# Patient Record
Sex: Female | Born: 1955
Health system: Southern US, Community
[De-identification: ages and names within clinical notes are randomized; demographics above are authoritative.]

## PROBLEM LIST (undated history)

## (undated) DIAGNOSIS — M1990 Unspecified osteoarthritis, unspecified site: Secondary | ICD-10-CM

## (undated) DIAGNOSIS — K219 Gastro-esophageal reflux disease without esophagitis: Secondary | ICD-10-CM

## (undated) DIAGNOSIS — J069 Acute upper respiratory infection, unspecified: Secondary | ICD-10-CM

## (undated) DIAGNOSIS — I10 Essential (primary) hypertension: Secondary | ICD-10-CM

## (undated) DIAGNOSIS — A09 Infectious gastroenteritis and colitis, unspecified: Secondary | ICD-10-CM

## (undated) DIAGNOSIS — K439 Ventral hernia without obstruction or gangrene: Secondary | ICD-10-CM

## (undated) DIAGNOSIS — K635 Polyp of colon: Secondary | ICD-10-CM

## (undated) DIAGNOSIS — T7840XA Allergy, unspecified, initial encounter: Secondary | ICD-10-CM

## (undated) DIAGNOSIS — Z8719 Personal history of other diseases of the digestive system: Secondary | ICD-10-CM

## (undated) DIAGNOSIS — K5790 Diverticulosis of intestine, part unspecified, without perforation or abscess without bleeding: Secondary | ICD-10-CM

## (undated) DIAGNOSIS — E785 Hyperlipidemia, unspecified: Secondary | ICD-10-CM

## (undated) HISTORY — DX: Polyp of colon: K63.5

## (undated) HISTORY — DX: Ventral hernia without obstruction or gangrene: K43.9

## (undated) HISTORY — DX: Infectious gastroenteritis and colitis, unspecified: A09

## (undated) HISTORY — DX: Allergy, unspecified, initial encounter: T78.40XA

## (undated) HISTORY — PX: OTHER SURGICAL HISTORY: SHX169

## (undated) HISTORY — PX: HEMORRHOID SURGERY: SHX153

## (undated) HISTORY — DX: Hyperlipidemia, unspecified: E78.5

## (undated) HISTORY — PX: FOOT SURGERY: SHX648

## (undated) HISTORY — DX: Acute upper respiratory infection, unspecified: J06.9

## (undated) HISTORY — DX: Diverticulosis of intestine, part unspecified, without perforation or abscess without bleeding: K57.90

## (undated) HISTORY — PX: INGUINAL HERNIA REPAIR: SHX194

## (undated) HISTORY — PX: DIAGNOSTIC LAPAROSCOPY: SUR761

## (undated) HISTORY — PX: VENTRAL HERNIA REPAIR: SHX424

## (undated) HISTORY — DX: Essential (primary) hypertension: I10

## (undated) HISTORY — PX: MRI: SHX5353

---

## 1997-05-01 ENCOUNTER — Encounter: Payer: Self-pay | Admitting: Internal Medicine

## 1997-10-09 ENCOUNTER — Encounter (HOSPITAL_BASED_OUTPATIENT_CLINIC_OR_DEPARTMENT_OTHER): Payer: Self-pay | Admitting: General Surgery

## 1997-10-12 ENCOUNTER — Ambulatory Visit (HOSPITAL_COMMUNITY): Admission: RE | Admit: 1997-10-12 | Discharge: 1997-10-12 | Payer: Self-pay | Admitting: General Surgery

## 1999-10-04 ENCOUNTER — Other Ambulatory Visit: Admission: RE | Admit: 1999-10-04 | Discharge: 1999-10-04 | Payer: Self-pay | Admitting: Obstetrics and Gynecology

## 1999-11-23 ENCOUNTER — Ambulatory Visit (HOSPITAL_COMMUNITY): Admission: RE | Admit: 1999-11-23 | Discharge: 1999-11-23 | Payer: Self-pay | Admitting: Family Medicine

## 1999-11-28 ENCOUNTER — Ambulatory Visit (HOSPITAL_BASED_OUTPATIENT_CLINIC_OR_DEPARTMENT_OTHER): Admission: RE | Admit: 1999-11-28 | Discharge: 1999-11-28 | Payer: Self-pay | Admitting: Family Medicine

## 2001-02-14 ENCOUNTER — Emergency Department (HOSPITAL_COMMUNITY): Admission: EM | Admit: 2001-02-14 | Discharge: 2001-02-14 | Payer: Self-pay | Admitting: *Deleted

## 2001-04-24 ENCOUNTER — Other Ambulatory Visit: Admission: RE | Admit: 2001-04-24 | Discharge: 2001-04-24 | Payer: Self-pay | Admitting: Obstetrics and Gynecology

## 2001-09-13 ENCOUNTER — Encounter (HOSPITAL_BASED_OUTPATIENT_CLINIC_OR_DEPARTMENT_OTHER): Payer: Self-pay | Admitting: General Surgery

## 2001-09-18 ENCOUNTER — Encounter (INDEPENDENT_AMBULATORY_CARE_PROVIDER_SITE_OTHER): Payer: Self-pay | Admitting: *Deleted

## 2001-09-18 ENCOUNTER — Ambulatory Visit (HOSPITAL_COMMUNITY): Admission: RE | Admit: 2001-09-18 | Discharge: 2001-09-18 | Payer: Self-pay | Admitting: General Surgery

## 2002-05-27 ENCOUNTER — Other Ambulatory Visit: Admission: RE | Admit: 2002-05-27 | Discharge: 2002-05-27 | Payer: Self-pay | Admitting: Obstetrics and Gynecology

## 2002-07-22 ENCOUNTER — Encounter: Payer: Self-pay | Admitting: Family Medicine

## 2002-07-22 ENCOUNTER — Ambulatory Visit (HOSPITAL_COMMUNITY): Admission: RE | Admit: 2002-07-22 | Discharge: 2002-07-22 | Payer: Self-pay | Admitting: Family Medicine

## 2002-07-22 ENCOUNTER — Encounter (INDEPENDENT_AMBULATORY_CARE_PROVIDER_SITE_OTHER): Payer: Self-pay | Admitting: *Deleted

## 2003-07-09 ENCOUNTER — Other Ambulatory Visit: Admission: RE | Admit: 2003-07-09 | Discharge: 2003-07-09 | Payer: Self-pay | Admitting: Obstetrics and Gynecology

## 2004-07-20 ENCOUNTER — Other Ambulatory Visit: Admission: RE | Admit: 2004-07-20 | Discharge: 2004-07-20 | Payer: Self-pay | Admitting: Obstetrics and Gynecology

## 2004-08-26 ENCOUNTER — Ambulatory Visit: Payer: Self-pay | Admitting: Family Medicine

## 2004-09-27 ENCOUNTER — Ambulatory Visit: Payer: Self-pay | Admitting: Family Medicine

## 2004-10-11 ENCOUNTER — Ambulatory Visit: Payer: Self-pay | Admitting: Family Medicine

## 2004-10-24 ENCOUNTER — Encounter: Payer: Self-pay | Admitting: Family Medicine

## 2005-02-24 ENCOUNTER — Ambulatory Visit: Payer: Self-pay | Admitting: Family Medicine

## 2005-03-10 ENCOUNTER — Ambulatory Visit: Payer: Self-pay | Admitting: Family Medicine

## 2005-03-24 ENCOUNTER — Ambulatory Visit: Payer: Self-pay | Admitting: Family Medicine

## 2005-03-30 ENCOUNTER — Ambulatory Visit: Payer: Self-pay | Admitting: Family Medicine

## 2005-04-18 ENCOUNTER — Encounter: Admission: RE | Admit: 2005-04-18 | Discharge: 2005-04-18 | Payer: Self-pay | Admitting: Family Medicine

## 2005-10-10 ENCOUNTER — Ambulatory Visit: Payer: Self-pay | Admitting: Family Medicine

## 2005-11-20 ENCOUNTER — Ambulatory Visit: Payer: Self-pay | Admitting: Family Medicine

## 2006-01-24 ENCOUNTER — Ambulatory Visit: Payer: Self-pay | Admitting: Family Medicine

## 2006-04-12 DIAGNOSIS — E785 Hyperlipidemia, unspecified: Secondary | ICD-10-CM | POA: Insufficient documentation

## 2006-04-12 DIAGNOSIS — I10 Essential (primary) hypertension: Secondary | ICD-10-CM | POA: Insufficient documentation

## 2006-08-28 ENCOUNTER — Telehealth (INDEPENDENT_AMBULATORY_CARE_PROVIDER_SITE_OTHER): Payer: Self-pay | Admitting: *Deleted

## 2006-08-29 ENCOUNTER — Ambulatory Visit: Payer: Self-pay | Admitting: Family Medicine

## 2006-08-29 LAB — CONVERTED CEMR LAB: Hemoglobin: 12.5 g/dL

## 2006-09-03 ENCOUNTER — Ambulatory Visit: Payer: Self-pay | Admitting: Family Medicine

## 2006-09-10 LAB — CONVERTED CEMR LAB
ALT: 25 units/L (ref 0–35)
AST: 21 units/L (ref 0–37)
Albumin: 3.5 g/dL (ref 3.5–5.2)
Alkaline Phosphatase: 55 units/L (ref 39–117)
BUN: 10 mg/dL (ref 6–23)
Bilirubin, Direct: 0.1 mg/dL (ref 0.0–0.3)
CO2: 28 meq/L (ref 19–32)
Calcium: 9 mg/dL (ref 8.4–10.5)
Chloride: 108 meq/L (ref 96–112)
Cholesterol: 167 mg/dL (ref 0–200)
Creatinine, Ser: 0.9 mg/dL (ref 0.4–1.2)
GFR calc Af Amer: 84 mL/min
GFR calc non Af Amer: 70 mL/min
Glucose, Bld: 122 mg/dL — ABNORMAL HIGH (ref 70–99)
HDL: 39.4 mg/dL (ref >39.0)
LDL Cholesterol: 99 mg/dL (ref 0–99)
Potassium: 3.7 meq/L (ref 3.5–5.1)
Sodium: 142 meq/L (ref 135–145)
Total Bilirubin: 1 mg/dL (ref 0.3–1.2)
Total CHOL/HDL Ratio: 4.2
Total Protein: 7.3 g/dL (ref 6.0–8.3)
Triglycerides: 144 mg/dL (ref 0–149)
VLDL: 29 mg/dL (ref 0–40)

## 2006-10-31 ENCOUNTER — Ambulatory Visit: Payer: Self-pay | Admitting: Family Medicine

## 2006-11-02 ENCOUNTER — Telehealth (INDEPENDENT_AMBULATORY_CARE_PROVIDER_SITE_OTHER): Payer: Self-pay | Admitting: *Deleted

## 2006-12-12 ENCOUNTER — Ambulatory Visit: Payer: Self-pay | Admitting: Family Medicine

## 2006-12-17 LAB — CONVERTED CEMR LAB
BUN: 9 mg/dL (ref 6–23)
CO2: 29 meq/L (ref 19–32)
Calcium: 9.3 mg/dL (ref 8.4–10.5)
Chloride: 103 meq/L (ref 96–112)
Creatinine, Ser: 0.8 mg/dL (ref 0.4–1.2)
GFR calc Af Amer: 96 mL/min
GFR calc non Af Amer: 80 mL/min
Glucose, Bld: 126 mg/dL — ABNORMAL HIGH (ref 70–99)
Hgb A1c MFr Bld: 7 % — ABNORMAL HIGH (ref 4.6–6.0)
Potassium: 4 meq/L (ref 3.5–5.1)
Sodium: 139 meq/L (ref 135–145)

## 2006-12-18 ENCOUNTER — Ambulatory Visit: Payer: Self-pay | Admitting: Family Medicine

## 2006-12-21 ENCOUNTER — Ambulatory Visit: Payer: Self-pay | Admitting: Family Medicine

## 2006-12-21 DIAGNOSIS — E119 Type 2 diabetes mellitus without complications: Secondary | ICD-10-CM | POA: Insufficient documentation

## 2006-12-27 ENCOUNTER — Telehealth (INDEPENDENT_AMBULATORY_CARE_PROVIDER_SITE_OTHER): Payer: Self-pay | Admitting: *Deleted

## 2007-01-14 ENCOUNTER — Ambulatory Visit: Payer: Self-pay | Admitting: Internal Medicine

## 2007-04-09 ENCOUNTER — Telehealth (INDEPENDENT_AMBULATORY_CARE_PROVIDER_SITE_OTHER): Payer: Self-pay | Admitting: *Deleted

## 2007-04-10 ENCOUNTER — Ambulatory Visit: Payer: Self-pay | Admitting: Family Medicine

## 2007-04-10 DIAGNOSIS — J309 Allergic rhinitis, unspecified: Secondary | ICD-10-CM | POA: Insufficient documentation

## 2007-04-17 ENCOUNTER — Telehealth (INDEPENDENT_AMBULATORY_CARE_PROVIDER_SITE_OTHER): Payer: Self-pay | Admitting: *Deleted

## 2007-08-06 ENCOUNTER — Ambulatory Visit: Payer: Self-pay | Admitting: Family Medicine

## 2007-08-08 ENCOUNTER — Ambulatory Visit: Payer: Self-pay | Admitting: Family Medicine

## 2007-08-18 ENCOUNTER — Encounter (INDEPENDENT_AMBULATORY_CARE_PROVIDER_SITE_OTHER): Payer: Self-pay | Admitting: *Deleted

## 2007-08-18 ENCOUNTER — Telehealth (INDEPENDENT_AMBULATORY_CARE_PROVIDER_SITE_OTHER): Payer: Self-pay | Admitting: *Deleted

## 2007-08-18 LAB — CONVERTED CEMR LAB
Albumin: 3.5 g/dL (ref 3.5–5.2)
BUN: 12 mg/dL (ref 6–23)
Calcium: 9 mg/dL (ref 8.4–10.5)
Cholesterol: 176 mg/dL (ref 0–200)
GFR calc Af Amer: 112 mL/min
Glucose, Bld: 114 mg/dL — ABNORMAL HIGH (ref 70–99)
HDL: 37.5 mg/dL — ABNORMAL LOW (ref >39.0)
Total Protein: 7.4 g/dL (ref 6.0–8.3)
VLDL: 29 mg/dL (ref 0–40)

## 2007-12-26 ENCOUNTER — Ambulatory Visit: Payer: Self-pay | Admitting: Family Medicine

## 2007-12-30 ENCOUNTER — Ambulatory Visit: Payer: Self-pay | Admitting: Family Medicine

## 2008-01-07 LAB — CONVERTED CEMR LAB
ALT: 26 units/L (ref 0–35)
AST: 23 units/L (ref 0–37)
Alkaline Phosphatase: 55 units/L (ref 39–117)
BUN: 13 mg/dL (ref 6–23)
CO2: 26 meq/L (ref 19–32)
Chloride: 109 meq/L (ref 96–112)
Cholesterol: 169 mg/dL (ref 0–200)
Creatinine, Ser: 0.8 mg/dL (ref 0.4–1.2)
Hgb A1c MFr Bld: 6.8 % — ABNORMAL HIGH (ref 4.6–6.0)
Total Bilirubin: 1 mg/dL (ref 0.3–1.2)
Total CHOL/HDL Ratio: 3.9
Triglycerides: 156 mg/dL — ABNORMAL HIGH (ref 0–149)

## 2008-01-09 ENCOUNTER — Telehealth (INDEPENDENT_AMBULATORY_CARE_PROVIDER_SITE_OTHER): Payer: Self-pay | Admitting: *Deleted

## 2008-01-13 ENCOUNTER — Encounter (INDEPENDENT_AMBULATORY_CARE_PROVIDER_SITE_OTHER): Payer: Self-pay | Admitting: *Deleted

## 2008-05-29 ENCOUNTER — Ambulatory Visit: Payer: Self-pay | Admitting: Family Medicine

## 2008-08-07 ENCOUNTER — Ambulatory Visit: Payer: Self-pay | Admitting: Family Medicine

## 2008-08-07 DIAGNOSIS — R5382 Chronic fatigue, unspecified: Secondary | ICD-10-CM | POA: Insufficient documentation

## 2008-08-16 LAB — CONVERTED CEMR LAB
AST: 19 units/L (ref 0–37)
Albumin: 3.6 g/dL (ref 3.5–5.2)
Alkaline Phosphatase: 66 units/L (ref 39–117)
Basophils Relative: 0.3 % (ref 0.0–3.0)
Bilirubin, Direct: 0 mg/dL (ref 0.0–0.3)
CO2: 30 meq/L (ref 19–32)
Calcium: 9.3 mg/dL (ref 8.4–10.5)
Eosinophils Relative: 1.3 % (ref 0.0–5.0)
Folate: >20 ng/mL
GFR calc non Af Amer: 95.64 mL/min (ref >60)
HDL: 42.6 mg/dL (ref >39.00)
Hemoglobin: 11.7 g/dL — ABNORMAL LOW (ref 12.0–15.0)
LDL Cholesterol: 88 mg/dL (ref 0–99)
Lymphocytes Relative: 36.3 % (ref 12.0–46.0)
MCHC: 31.7 g/dL (ref 30.0–36.0)
Monocytes Relative: 7.4 % (ref 3.0–12.0)
Neutro Abs: 4.9 10*3/uL (ref 1.4–7.7)
Neutrophils Relative %: 54.7 % (ref 43.0–77.0)
RBC: 5.29 M/uL — ABNORMAL HIGH (ref 3.87–5.11)
Sodium: 141 meq/L (ref 135–145)
Total CHOL/HDL Ratio: 4
Total Protein: 7.8 g/dL (ref 6.0–8.3)
Vit D, 25-Hydroxy: 38 ng/mL (ref 30–89)
Vitamin B-12: 165 pg/mL — ABNORMAL LOW (ref 211–911)
WBC: 8.9 10*3/uL (ref 4.5–10.5)

## 2008-08-18 ENCOUNTER — Encounter (INDEPENDENT_AMBULATORY_CARE_PROVIDER_SITE_OTHER): Payer: Self-pay | Admitting: *Deleted

## 2008-08-19 ENCOUNTER — Telehealth (INDEPENDENT_AMBULATORY_CARE_PROVIDER_SITE_OTHER): Payer: Self-pay | Admitting: *Deleted

## 2008-09-18 ENCOUNTER — Ambulatory Visit: Payer: Self-pay | Admitting: Family Medicine

## 2008-10-23 ENCOUNTER — Ambulatory Visit: Payer: Self-pay | Admitting: Internal Medicine

## 2008-10-24 ENCOUNTER — Inpatient Hospital Stay (HOSPITAL_COMMUNITY): Admission: EM | Admit: 2008-10-24 | Discharge: 2008-10-27 | Payer: Self-pay | Admitting: Emergency Medicine

## 2008-10-24 ENCOUNTER — Encounter (INDEPENDENT_AMBULATORY_CARE_PROVIDER_SITE_OTHER): Payer: Self-pay | Admitting: *Deleted

## 2008-10-27 ENCOUNTER — Telehealth: Payer: Self-pay | Admitting: Family Medicine

## 2008-10-27 ENCOUNTER — Telehealth: Payer: Self-pay | Admitting: Nurse Practitioner

## 2008-10-28 ENCOUNTER — Telehealth: Payer: Self-pay | Admitting: Family Medicine

## 2008-10-28 ENCOUNTER — Telehealth: Payer: Self-pay | Admitting: Nurse Practitioner

## 2008-10-30 ENCOUNTER — Ambulatory Visit: Payer: Self-pay | Admitting: Family Medicine

## 2008-11-02 ENCOUNTER — Encounter: Payer: Self-pay | Admitting: Family Medicine

## 2008-11-13 ENCOUNTER — Ambulatory Visit: Payer: Self-pay | Admitting: Gastroenterology

## 2008-11-13 DIAGNOSIS — Z8719 Personal history of other diseases of the digestive system: Secondary | ICD-10-CM | POA: Insufficient documentation

## 2008-12-04 ENCOUNTER — Encounter: Payer: Self-pay | Admitting: Nurse Practitioner

## 2009-02-11 ENCOUNTER — Telehealth (INDEPENDENT_AMBULATORY_CARE_PROVIDER_SITE_OTHER): Payer: Self-pay | Admitting: *Deleted

## 2009-02-12 ENCOUNTER — Ambulatory Visit: Payer: Self-pay | Admitting: Family Medicine

## 2009-03-01 ENCOUNTER — Telehealth (INDEPENDENT_AMBULATORY_CARE_PROVIDER_SITE_OTHER): Payer: Self-pay | Admitting: *Deleted

## 2009-03-08 ENCOUNTER — Encounter: Payer: Self-pay | Admitting: Family Medicine

## 2009-03-16 ENCOUNTER — Ambulatory Visit: Payer: Self-pay | Admitting: Family Medicine

## 2009-03-25 ENCOUNTER — Telehealth (INDEPENDENT_AMBULATORY_CARE_PROVIDER_SITE_OTHER): Payer: Self-pay | Admitting: *Deleted

## 2009-03-25 LAB — CONVERTED CEMR LAB
ALT: 23 units/L (ref 0–35)
AST: 20 units/L (ref 0–37)
Albumin: 3.7 g/dL (ref 3.5–5.2)
Alkaline Phosphatase: 63 units/L (ref 39–117)
HDL: 49.3 mg/dL (ref >39.00)
Total Bilirubin: 0.9 mg/dL (ref 0.3–1.2)
Total CHOL/HDL Ratio: 4
Triglycerides: 164 mg/dL — ABNORMAL HIGH (ref 0.0–149.0)

## 2009-04-06 ENCOUNTER — Ambulatory Visit: Payer: Self-pay | Admitting: Family Medicine

## 2009-04-07 ENCOUNTER — Telehealth: Payer: Self-pay | Admitting: Family Medicine

## 2009-04-07 LAB — CONVERTED CEMR LAB: Anti Nuclear Antibody(ANA): NEGATIVE

## 2009-04-13 ENCOUNTER — Telehealth: Payer: Self-pay | Admitting: Family Medicine

## 2009-04-19 LAB — CONVERTED CEMR LAB
Basophils Absolute: 0.1 10*3/uL (ref 0.0–0.1)
CO2: 31 meq/L (ref 19–32)
Calcium: 9.5 mg/dL (ref 8.4–10.5)
Creatinine, Ser: 0.8 mg/dL (ref 0.4–1.2)
Eosinophils Absolute: 0.1 10*3/uL (ref 0.0–0.7)
GFR calc non Af Amer: 95.41 mL/min (ref >60)
Glucose, Bld: 111 mg/dL — ABNORMAL HIGH (ref 70–99)
Hemoglobin: 11.3 g/dL — ABNORMAL LOW (ref 12.0–15.0)
Lymphocytes Relative: 36.2 % (ref 12.0–46.0)
MCHC: 31.1 g/dL (ref 30.0–36.0)
Monocytes Relative: 7.2 % (ref 3.0–12.0)
Neutro Abs: 4.2 10*3/uL (ref 1.4–7.7)
Neutrophils Relative %: 54.1 % (ref 43.0–77.0)
RDW: 14 % (ref 11.5–14.6)
Rhuematoid fact SerPl-aCnc: 26.4 intl units/mL — ABNORMAL HIGH (ref 0.0–20.0)
Sed Rate: 28 mm/hr — ABNORMAL HIGH (ref 0–22)
Sodium: 143 meq/L (ref 135–145)
Uric Acid, Serum: 5.9 mg/dL (ref 2.4–7.0)

## 2009-09-06 ENCOUNTER — Ambulatory Visit: Payer: Self-pay | Admitting: Family Medicine

## 2009-09-06 ENCOUNTER — Telehealth: Payer: Self-pay | Admitting: Family Medicine

## 2009-09-06 DIAGNOSIS — R002 Palpitations: Secondary | ICD-10-CM | POA: Insufficient documentation

## 2009-09-10 ENCOUNTER — Telehealth (INDEPENDENT_AMBULATORY_CARE_PROVIDER_SITE_OTHER): Payer: Self-pay | Admitting: *Deleted

## 2009-09-10 ENCOUNTER — Encounter: Payer: Self-pay | Admitting: Family Medicine

## 2009-09-22 ENCOUNTER — Encounter (INDEPENDENT_AMBULATORY_CARE_PROVIDER_SITE_OTHER): Payer: Self-pay | Admitting: *Deleted

## 2009-10-05 ENCOUNTER — Encounter (INDEPENDENT_AMBULATORY_CARE_PROVIDER_SITE_OTHER): Payer: Self-pay | Admitting: *Deleted

## 2009-10-07 ENCOUNTER — Encounter: Payer: Self-pay | Admitting: Internal Medicine

## 2009-10-07 ENCOUNTER — Telehealth: Payer: Self-pay | Admitting: Internal Medicine

## 2009-10-08 ENCOUNTER — Ambulatory Visit: Payer: Self-pay | Admitting: Family Medicine

## 2009-10-11 ENCOUNTER — Telehealth (INDEPENDENT_AMBULATORY_CARE_PROVIDER_SITE_OTHER): Payer: Self-pay | Admitting: *Deleted

## 2009-10-11 LAB — CONVERTED CEMR LAB
Eosinophils Relative: 1.4 % (ref 0.0–5.0)
HCT: 35 % — ABNORMAL LOW (ref 36.0–46.0)
Hemoglobin: 11.4 g/dL — ABNORMAL LOW (ref 12.0–15.0)
Lymphs Abs: 3.1 10*3/uL (ref 0.7–4.0)
MCV: 69.6 fL — ABNORMAL LOW (ref 78.0–100.0)
Monocytes Absolute: 0.6 10*3/uL (ref 0.1–1.0)
Monocytes Relative: 7.3 % (ref 3.0–12.0)
Neutro Abs: 4.1 10*3/uL (ref 1.4–7.7)
RDW: 14.9 % — ABNORMAL HIGH (ref 11.5–14.6)
Vitamin B-12: 309 pg/mL (ref 211–911)
WBC: 7.9 10*3/uL (ref 4.5–10.5)

## 2009-11-04 DIAGNOSIS — K5730 Diverticulosis of large intestine without perforation or abscess without bleeding: Secondary | ICD-10-CM | POA: Insufficient documentation

## 2009-11-04 DIAGNOSIS — D1803 Hemangioma of intra-abdominal structures: Secondary | ICD-10-CM | POA: Insufficient documentation

## 2009-11-04 DIAGNOSIS — D649 Anemia, unspecified: Secondary | ICD-10-CM | POA: Insufficient documentation

## 2009-11-04 DIAGNOSIS — K7689 Other specified diseases of liver: Secondary | ICD-10-CM | POA: Insufficient documentation

## 2009-11-09 ENCOUNTER — Ambulatory Visit: Payer: Self-pay | Admitting: Internal Medicine

## 2009-11-09 DIAGNOSIS — Z860100 Personal history of colon polyps, unspecified: Secondary | ICD-10-CM | POA: Insufficient documentation

## 2009-11-17 ENCOUNTER — Ambulatory Visit: Payer: Self-pay | Admitting: Internal Medicine

## 2010-01-07 ENCOUNTER — Telehealth (INDEPENDENT_AMBULATORY_CARE_PROVIDER_SITE_OTHER): Payer: Self-pay | Admitting: *Deleted

## 2010-02-13 LAB — CONVERTED CEMR LAB
Albumin: 4 g/dL (ref 3.5–5.2)
Alkaline Phosphatase: 62 units/L (ref 39–117)
BUN: 14 mg/dL (ref 6–23)
Basophils Absolute: 0 10*3/uL (ref 0.0–0.1)
Calcium: 9.6 mg/dL (ref 8.4–10.5)
Creatinine, Ser: 0.7 mg/dL (ref 0.4–1.2)
Eosinophils Relative: 2.7 % (ref 0.0–5.0)
GFR calc non Af Amer: 104.23 mL/min (ref >60)
Glucose, Bld: 108 mg/dL — ABNORMAL HIGH (ref 70–99)
HCT: 36.7 % (ref 36.0–46.0)
Lymphocytes Relative: 64.8 % — ABNORMAL HIGH (ref 12.0–46.0)
Lymphs Abs: 3.3 10*3/uL (ref 0.7–4.0)
Monocytes Relative: 15.9 % — ABNORMAL HIGH (ref 3.0–12.0)
Neutrophils Relative %: 16 % — ABNORMAL LOW (ref 43.0–77.0)
Platelets: 341 10*3/uL (ref 150.0–400.0)
RDW: 14.9 % — ABNORMAL HIGH (ref 11.5–14.6)
TSH: 0.92 microintl units/mL (ref 0.35–5.50)
Total Bilirubin: 0.7 mg/dL (ref 0.3–1.2)
WBC: 5.1 10*3/uL (ref 4.5–10.5)

## 2010-02-17 NOTE — Progress Notes (Signed)
Summary: med too expensive  Phone Note Call from Patient Call back at Work Phone [phone]   Caller: Patient Summary of Call: pt left VM that med was too expensive and needs another med rx. left message to call back to clarify which med pt was referring to.............Marland KitchenFelecia Deloach CMA  April 13, 2009 3:53 PM   left message to call office................Marland KitchenFelecia Deloach CMA  April 14, 2009 9:10 AM   Follow-up for Phone Call        pt states that the antara is $150. per pharmacy can substitute with generic FENOFIBRATE 134mg  if doctor agrees. pt use [facility] pls advise...........Marland KitchenFelecia Deloach CMA  April 14, 2009 9:26 AM   Additional Follow-up for Phone Call Additional follow up Details #1::        may not be strong enough but we can try it---#30  2 refills Additional Follow-up by: Loreen Freud DO,  April 14, 2009 10:48 AM    Additional Follow-up for Phone Call Additional follow up Details #2::    left message to call back. Army Fossa CMA  April 14, 2009 11:28 AM   Additional Follow-up for Phone Call Additional follow up Details #3:: Details for Additional Follow-up Action Taken: pt aware rx sent to pharmacy..................Marland KitchenFelecia Deloach CMA  April 14, 2009 12:14 PM   New/Updated Medications: FENOFIBRATE MICRONIZED 134 MG CAPS (FENOFIBRATE MICRONIZED) Take 1 tab once daily Prescriptions: FENOFIBRATE MICRONIZED 134 MG CAPS (FENOFIBRATE MICRONIZED) Take 1 tab once daily  #30 x 2   Entered by:   Jeremy Johann CMA   Authorized by:   Loreen Freud DO   Signed by:   Jeremy Johann CMA on 04/14/2009   Method used:   [facility]ed to ...       [facility]       [address]       [place], [place]  [address]       Ph: [phone]       Fax: [phone]   RxID:   [id number]

## 2010-02-17 NOTE — Progress Notes (Signed)
Summary: dm testing supply  Phone Note From Other Clinic Call back at [phone] ext [phone]   Caller: joan ([facility]) Summary of Call: VM left to check on status of order for DM testing supply that were faxed to office...........Marland KitchenFelecia Deloach CMA  September 10, 2009 4:01 PM     called back to [place] and they stated they rcvd the form. call ended.                             Almeta Monas CMA Duncan Dull)  September 10, 2009 4:22 PM   Follow-up for Phone Call

## 2010-02-17 NOTE — Progress Notes (Signed)
Summary: fyi fyi rapid heart beat ov today  Phone Note Call from Patient Call back at Work Phone [phone]   Caller: Patient Summary of Call: pt c/o headache, anxiousness, rapid heart beat. pt denies any CP, sob, numbness or tingling, dizziness. pt bp today 127/82 and BS 165. pt states that she had several cocktail l this weekend and previously when she had alcohol beverage she felt the same way. pt states that her heart fluttering.Advise pt ed pt refuse will like OV. ov schedule, pt advise if symptoms increase or change she needs to be seen in ED prior to appt.................Marland KitchenFelecia Deloach CMA  September 06, 2009 10:16 AM

## 2010-02-17 NOTE — Letter (Signed)
Summary: [facility] Office Note  [facility] Office Note   Imported By: Lamona Curl CMA (AAMA) 11/05/2009 17:13:32  _____________________________________________________________________  External Attachment:    Type:   Image     Comment:   External Document

## 2010-02-17 NOTE — Letter (Signed)
Summary: [facility] Instructions  [facility]  [address] [place], [place] [address]   Phone: [phone]  Fax: [phone]       CALVINA LIPTAK    Aug 28, 1960    MRN: [id number]       Procedure Day Dorna Bloom: Wednesday 11/17/09     Arrival Time: 10:30 am     Procedure Time: 11:30 am     Location of Procedure:                    _x _  [facility] (4th Floor)  PREPARATION FOR COLONOSCOPY WITH MIRALAX  Starting 5 days prior to your procedure 11/13/09 do not eat nuts, seeds, popcorn, corn, beans, peas,  salads, or any raw vegetables.  Do not take any fiber supplements (e.g. Metamucil, Citrucel, and Benefiber). ____________________________________________________________________________________________________   THE DAY BEFORE YOUR PROCEDURE         DATE: 11/16/09 DAY: Tuesday  1   Drink clear liquids the entire day-NO SOLID FOOD  2   Do not drink anything colored red or purple.  Avoid juices with pulp.  No orange juice.  3   Drink at least 64 oz. (8 glasses) of fluid/clear liquids during the day to prevent dehydration and help the prep work efficiently.  CLEAR LIQUIDS INCLUDE: Water Jello Ice Popsicles Tea (sugar ok, no milk/cream) Powdered fruit flavored drinks Coffee (sugar ok, no milk/cream) Gatorade Juice: apple, white grape, white cranberry  Lemonade Clear bullion, consomm, broth Carbonated beverages (any kind) Strained chicken noodle soup Hard Candy  4   Mix the entire bottle of Miralax with 64 oz. of Gatorade/Powerade in the morning and put in the refrigerator to chill.  5   At 3:00 pm take 2 Dulcolax/Bisacodyl tablets.  6   At 4:30 pm take one Reglan/Metoclopramide tablet.  7  Starting at 5:00 pm drink one 8 oz glass of the Miralax mixture every 15-20 minutes until you have finished drinking the entire 64 oz.  You should finish drinking prep around 7:30 or 8:00 pm.  8   If you are nauseated, you may take the 2nd Reglan/Metoclopramide  tablet at 6:30 pm.        9    At 8:00 pm take 2 more DULCOLAX/Bisacodyl tablets.     THE DAY OF YOUR PROCEDURE      DATE:  11/17/09 DAY: Tuesday  You may drink clear liquids until 9:30 am  (2 HOURS BEFORE PROCEDURE).   MEDICATION INSTRUCTIONS  Unless otherwise instructed, you should take regular prescription medications with a small sip of water as early as possible the morning of your procedure.  Diabetic patients - see separate instructions.          OTHER INSTRUCTIONS  You will need a responsible adult at least 57 years of age to accompany you and drive you home.   This person must remain in the waiting room during your procedure.  Wear loose fitting clothing that is easily removed.  Leave jewelry and other valuables at home.  However, you may wish to bring a book to read or an iPod/MP3 player to listen to music as you wait for your procedure to start.  Remove all body piercing jewelry and leave at home.  Total time from sign-in until discharge is approximately 2-3 hours.  You should go home directly after your procedure and rest.  You can resume normal activities the day after your procedure.  The day of your procedure you should not:  Drive   Make legal decisions   Operate machinery   Drink alcohol   Return to work  You will receive specific instructions about eating, activities and medications before you leave.   The above instructions have been reviewed and explained to me by  Lamona Curl CMA Duncan Dull)  November 09, 2009 10:22 AM     I fully understand and can verbalize these instructions _____________________________ Date _______

## 2010-02-17 NOTE — Progress Notes (Signed)
Summary: Triage  Phone Note From Other Clinic   Caller: kAREN @ Dr. Marcelle Overlie [phone] Call For: Dr. Juanda Chance Summary of Call: Hemoglobin is 10.6 and post menapausal...requesting pt. be seen before next avail. Initial call taken by: Karna Christmas,  October 07, 2009 9:08 AM  Follow-up for Phone Call        Patient  is scheduled to see Dr Juanda Chance 11/09/09 8:45.  I have mailed her a new patient letter.  Follow-up by: Darcey Nora RN, CGRN,  October 07, 2009 10:44 AM

## 2010-02-17 NOTE — Letter (Signed)
Summary: Diabetic Instructions  [facility]  [address] [place], [place] [address]   Phone: [phone]  Fax: [phone]    CHESSICA AUDIA 1955-05-30 MRN: [id number]   _ x _   ORAL DIABETIC MEDICATION INSTRUCTIONS           GLUCOPHAGE The day before your procedure:   Take your diabetic pill as you do normally  The day of your procedure:   Do not take your diabetic pill    We will check your blood sugar levels during the admission process and again in Recovery before discharging you home  ________________________________________________________________________

## 2010-02-17 NOTE — Medication Information (Signed)
Summary: [facility]  [facility]   Imported By: Lanelle Bal 09/30/2009 08:45:01  _____________________________________________________________________  External Attachment:    Type:   Image     Comment:   External Document

## 2010-02-17 NOTE — Letter (Signed)
Summary: Colonoscopy Letter  [facility]  [address] [place], [place] [address]   Phone: [phone]  Fax: [phone]      September 22, 2009 MRN: [id number]   [facility] [address] [place], [place]  [address]   Dear Ms. Klepacki,   According to your medical record, it is time for you to schedule a Colonoscopy. The American Cancer Society recommends this procedure as a method to detect early colon cancer. Patients with a family history of colon cancer, or a personal history of colon polyps or inflammatory bowel disease are at increased risk.  This letter has beeen generated based on the recommendations made at the time of your procedure. If you feel that in your particular situation this may no longer apply, please contact our office.  Please call our office at [phone] to schedule this appointment or to update your records at your earliest convenience.  Thank you for cooperating with [place] to provide you with the very best care possible.   Sincerely,  Hedwig Morton. Juanda Chance, M.D.  [facility] [phone]

## 2010-02-17 NOTE — Assessment & Plan Note (Signed)
Summary: anemia   History of Present Illness Visit Type: Initial Consult Primary GI MD: Lina Sar MD Primary Annarae Macnair: Loreen Freud, DO  Requesting Kenner Lewan: Richarda Overlie, MD  Chief Complaint: Referred for anemia. Pt c/o GERD  History of Present Illness:   This is a 55 year old [place] American female with a history of ischemic colitis requiring hospitalization in October 2010. A CT Scan of the abdomen showed diffuse thickening of the colon consistent with colitis. She is doing well now but was found to be anemic with a hemoglobin of 10.6 in September 2011at [facility] office. She was started on iron and a repeat hemoglobin was 11.4 with a hematocrit of 35.0 with an MCV of 69. Her B12 was 309 with an iron saturation of 20%. She had a colonoscopy in October 2006 by Dr Kinnie Scales with findings of a polyp. The report says there was polypoid mucosa. She has a history of a hemangioma of the liver, high blood pressure, and fatty liver.   GI Review of Systems    Reports acid reflux and  heartburn.      Denies abdominal pain, belching, bloating, chest pain, dysphagia with liquids, dysphagia with solids, loss of appetite, nausea, vomiting, vomiting blood, weight loss, and  weight gain.      Reports diverticulosis.     Denies anal fissure, black tarry stools, change in bowel habit, constipation, diarrhea, fecal incontinence, heme positive stool, hemorrhoids, irritable bowel syndrome, jaundice, light color stool, liver problems, rectal bleeding, and  rectal pain.    Current Medications (verified): 1)  Lisinopril-Hydrochlorothiazide 10-12.5 Mg Tabs (Lisinopril-Hydrochlorothiazide) .Marland Kitchen.. 1 By Mouth Once Daily 2)  [employer] Ec Aspirin 325 Mg  Tbec (Aspirin) .... By Mouth Once Daily 3)  Freestyle Lancets   Misc (Lancets) .... Use As Directed 4)  Freestyle Lite   Strp (Glucose Blood) .... Use As Directed 5)  Flonase 50 Mcg/act  Susp (Fluticasone Propionate) .... 2 Sprays Each Nostril Once Daily 6)   Calcium-Vitamin D 600-200 Mg-Unit Tabs (Calcium-Vitamin D) .... One Tablet By Mouth Once Daily 7)  Glucophage Xr 500 Mg Xr24h-Tab (Metformin Hcl) .... 2 By Mouth At Bedtime 8)  Cheratussin Ac 100-10 Mg/56ml Syrp (Guaifenesin-Codeine) .Marland Kitchen.. 1-2  Tsp By Mouth At Bedtime As Needed Cough 9)  Xyzal 5 Mg Tabs (Levocetirizine Dihydrochloride) .Marland Kitchen.. 1 By Mouth Once Daily As Needed Allergies 10)  Fenofibrate Micronized 134 Mg Caps (Fenofibrate Micronized) .... Take 1 Tab Once Daily 11)  Fish Oil 1000 Mg Caps (Omega-3 Fatty Acids) .... One Capsule By Mouth Once Daily 12)  Alprazolam 0.25 Mg Tabs (Alprazolam) .... 1/2 -1 By Mouth Three Times A Day As Needed 13)  Vitron-C 200-125 Mg Tabs (Ferrous Fumarate-Vitamin C) .... One Tablet By Mouth Once Daily 14)  Meloxicam 15 Mg Tabs (Meloxicam) .... As Needed  Allergies (verified): 1)  ! Asa  Past History:  Past Medical History: Hypertension Diabetes mellitus, type II (11/2006) Allergic rhinitis Infectious Colitis Diverticulosis Hyperlipidemia Colon Polyps  Past Surgical History: Inguinal herniorrhaphy X 3 Hemorrhoidectomy Ventral Hernia Repair Right Foot Surgery   Family History: Reviewed history from 11/04/2009 and no changes required. No FH of Colon Cancer: Family History of Breast Cancer:Mother Throat cancer: Aunt Family History of Diabetes: Mother, Grandmother,  Brother Family History of Heart Disease: Father  Social History: Academic librarian  Divorced Daily Caffeine Use Illicit Drug Use - no Patient has never smoked.  Alcohol Use - no  Review of Systems       The patient complains of anemia  and cough.  The patient denies allergy/sinus, anxiety-new, arthritis/joint pain, back pain, blood in urine, breast changes/lumps, change in vision, confusion, coughing up blood, depression-new, fainting, fatigue, fever, headaches-new, hearing problems, heart murmur, heart rhythm changes, itching, menstrual pain, muscle pains/cramps,  night sweats, nosebleeds, pregnancy symptoms, shortness of breath, skin rash, sleeping problems, sore throat, swelling of feet/legs, swollen lymph glands, thirst - excessive , urination - excessive , urination changes/pain, urine leakage, vision changes, and voice change.         Pertinent positive and negative review of systems were noted in the above HPI. All other ROS was otherwise negative.   Vital Signs:  Patient profile:   55 year old female Height:      66 inches Weight:      193 pounds BMI:     31.26 BSA:     1.97 Pulse rate:   88 / minute Pulse rhythm:   regular BP sitting:   128 / 74  (left arm) Cuff size:   regular  Vitals Entered By: Ok Anis CMA (November 09, 2009 9:22 AM)  Physical Exam  General:  Well developed, well nourished, no acute distress. Eyes:  PERRLA, no icterus. Mouth:  No deformity or lesions, dentition normal. Neck:  Supple; no masses or thyromegaly. Lungs:  Clear throughout to auscultation. Heart:  Regular rate and rhythm; no murmurs, rubs,  or bruits. Abdomen:  periumbilical hernia of 5-6 cm diameter, easily reducible. Normoactive bowel sounds. No tenderness. Rectal:  normal rectal tone, soft Hemoccult negative stool. Extremities:  No clubbing, cyanosis, edema or deformities noted. Skin:  Intact without significant lesions or rashes. Psych:  Alert and cooperative. Normal mood and affect.   Impression & Recommendations:  Problem # 1:  UNSPECIFIED ANEMIA (ICD-285.9) Patient has microcytic anemia and is currently on iron supplements. Iron saturation is up to 20% and her hemoglobin up to 11.4. Patient is Hemoccult negative.  Problem # 2:  FATTY LIVER DISEASE (ICD-571.8) Patient has fatty liver and a hemangioma of the left lobe of the liver demonstrated on a CT scan. Her liver function tests are normal.  Problem # 3:  COLITIS (ICD-558.9) Patient is status post acute colitis in October 2010; probably ischemic. All symptoms have resolved.  Problem  # 4:  COLONIC POLYPS, HX OF (ICD-V12.72)  Patient had a left colon polyp on her colonoscopy in 2006. He will repeat a colonoscopy at this time.  Orders: Colonoscopy (Colon)  Patient Instructions: 1)  You have been scheduled for colonoscopy using MiraLax prep for evaluation of microcytic anemia as well as for a followup of colon polyps and of acute colitis. 2)  Adjust diabetic medications when prepping for colonoscopy. 3)  Continue weight loss. 4)  Copy sent to : Dr Laury Axon 5)  The medication list was reviewed and reconciled.  All changed / newly prescribed medications were explained.  A complete medication list was provided to the patient / caregiver. Prescriptions: DULCOLAX 5 MG  TBEC (BISACODYL) Day before procedure take 2 at 3pm and 2 at 8pm.  #4 x 0   Entered by:   Lamona Curl CMA (AAMA)   Authorized by:   Hart Carwin MD   Signed by:   Lamona Curl CMA (AAMA) on 11/09/2009   Method used:   Electronically to        [facility] (retail)       [address]       [place],  [place]  [address]       Ph: [phone]       Fax: [phone]   RxID:   [id number] REGLAN 10 MG  TABS (METOCLOPRAMIDE HCL) As per prep instructions.  #2 x 0   Entered by:   Lamona Curl CMA (AAMA)   Authorized by:   Hart Carwin MD   Signed by:   Lamona Curl CMA (AAMA) on 11/09/2009   Method used:   Electronically to        [facility] (retail)       [address]       [place], [place]  [address]       Ph: [phone]       Fax: [phone]   RxID:   [id number] MIRALAX   POWD (POLYETHYLENE GLYCOL 3350) As per prep  instructions.  #255gm x 0   Entered by:   Lamona Curl CMA (AAMA)   Authorized by:   Hart Carwin MD   Signed by:   Lamona Curl CMA (AAMA) on 11/09/2009   Method used:   Electronically to        [facility] (retail)       [address]       [place], [place]  [address]       Ph: [phone]       Fax: [phone]   RxID:   [id number]

## 2010-02-17 NOTE — Assessment & Plan Note (Signed)
Summary: SORE THROAT,COUGH/fd   Vital Signs:  Patient profile:   55 year old female Weight:      192 pounds Temp:     98.5 degrees F oral Pulse rate:   82 / minute Pulse rhythm:   regular BP sitting:   124 / 74  (left arm) Cuff size:   regular  Vitals Entered By: Army Fossa CMA (February 12, 2009 8:11 AM) CC: Pt states she is unable to sleep because she is coughing at night- so sore throat or fever in 1 week. , Cough   History of Present Illness:  Cough      This is a 55 year old woman who presents with Cough.  The symptoms began 2 weeks ago.  Pt had fever and sore throal last week --all symptoms have resolved except cough.  The patient reports non-productive cough, but denies productive cough, pleuritic chest pain, shortness of breath, wheezing, exertional dyspnea, fever, hemoptysis, and malaise.  Associated symtpoms include chronic rhinitis.  The patient denies the following symptoms: cold/URI symptoms, sore throat, nasal congestion, weight loss, acid reflux symptoms, and peripheral edema.  The cough is worse with lying down.  Ineffective prior treatments have included OTC cough medication.    Current Medications (verified): 1)  Lisinopril-Hydrochlorothiazide 10-12.5 Mg Tabs (Lisinopril-Hydrochlorothiazide) .Marland Kitchen.. 1 By Mouth Once Daily 2)  Albertsons Ec Aspirin 325 Mg  Tbec (Aspirin) 3)  Freestyle Lancets   Misc (Lancets) .... Use As Directed 4)  Freestyle Lite   Strp (Glucose Blood) .... Use As Directed 5)  Flonase 50 Mcg/act  Susp (Fluticasone Propionate) .... 2 Sprays Each Nostril Once Daily 6)  Vit-D and Calcium 600 7)  Pravachol 40 Mg Tabs (Pravastatin Sodium) .... Take 1 By Mouth At Bedtime 8)  Glucophage Xr 500 Mg Xr24h-Tab (Metformin Hcl) .Marland Kitchen.. 1 By Mouth Once Daily. Needs Labwork. 9)  Cheratussin Ac 100-10 Mg/56ml Syrp (Guaifenesin-Codeine) .Marland Kitchen.. 1-2  Tsp By Mouth At Bedtime As Needed Cough 10)  Xyzal 5 Mg Tabs (Levocetirizine Dihydrochloride) .Marland Kitchen.. 1 By Mouth Once Daily As  Needed Allergies  Allergies (verified): No Known Drug Allergies  Past History:  Past medical, surgical, family and social histories (including risk factors) reviewed for relevance to current acute and chronic problems.  Past Medical History: Reviewed history from 11/13/2008 and no changes required. Hypertension Diabetes mellitus, type II (11/2006) Allergic rhinitis Colitis  Past Surgical History: Reviewed history from 04/12/2006 and no changes required. Inguinal herniorrhaphy X 3 Hemorrhoidectomy  Family History: Reviewed history from 11/13/2008 and no changes required. No FH of Colon Cancer: Family History of Breast Cancer:Mother Throat cancer: Aunt Family History of Diabetes: Mother, Grandmother  Social History: Reviewed history from 11/13/2008 and no changes required. Divorced Patient has never smoked.  Alcohol Use - yes Daily Caffeine Use Illicit Drug Use - no  Review of Systems      See HPI  Physical Exam  General:  Well-developed,well-nourished,in no acute distress; alert,appropriate and cooperative throughout examination Ears:  External ear exam shows no significant lesions or deformities.  Otoscopic examination reveals clear canals, tympanic membranes are intact bilaterally without bulging, retraction, inflammation or discharge. Hearing is grossly normal bilaterally. Nose:  External nasal examination shows no deformity or inflammation. Nasal mucosa are pink and moist without lesions or exudates. Mouth:  no exudates and postnasal drip.   Neck:  No deformities, masses, or tenderness noted. Lungs:  Normal respiratory effort, chest expands symmetrically. Lungs are clear to auscultation, no crackles or wheezes. Heart:  normal rate and no  murmur.   Skin:  Intact without suspicious lesions or rashes Cervical Nodes:  No lymphadenopathy noted Psych:  Cognition and judgment appear intact. Alert and cooperative with normal attention span and concentration. No apparent  delusions, illusions, hallucinations   Impression & Recommendations:  Problem # 1:  URI (ICD-465.9)  Her updated medication list for this problem includes:    Albertsons Ec Aspirin 325 Mg Tbec (Aspirin)    Cheratussin Ac 100-10 Mg/6ml Syrp (Guaifenesin-codeine) .Marland Kitchen... 1-2  tsp by mouth at bedtime as needed cough    Xyzal 5 Mg Tabs (Levocetirizine dihydrochloride) .Marland Kitchen... 1 by mouth once daily as needed allergies  Instructed on symptomatic treatment. Call if symptoms persist or worsen.   Complete Medication List: 1)  Lisinopril-hydrochlorothiazide 10-12.5 Mg Tabs (Lisinopril-hydrochlorothiazide) .Marland Kitchen.. 1 by mouth once daily 2)  Albertsons Ec Aspirin 325 Mg Tbec (Aspirin) 3)  Freestyle Lancets Misc (Lancets) .... Use as directed 4)  Freestyle Lite Strp (Glucose blood) .... Use as directed 5)  Flonase 50 Mcg/act Susp (Fluticasone propionate) .... 2 sprays each nostril once daily 6)  Vit-d and Calcium 600  7)  Pravachol 40 Mg Tabs (Pravastatin sodium) .... Take 1 by mouth at bedtime 8)  Glucophage Xr 500 Mg Xr24h-tab (Metformin hcl) .Marland Kitchen.. 1 by mouth once daily. needs labwork. 9)  Cheratussin Ac 100-10 Mg/2ml Syrp (Guaifenesin-codeine) .Marland Kitchen.. 1-2  tsp by mouth at bedtime as needed cough 10)  Xyzal 5 Mg Tabs (Levocetirizine dihydrochloride) .Marland Kitchen.. 1 by mouth once daily as needed allergies Prescriptions: XYZAL 5 MG TABS (LEVOCETIRIZINE DIHYDROCHLORIDE) 1 by mouth once daily as needed allergies  #30 x 11   Entered and Authorized by:   Loreen Freud DO   Signed by:   Loreen Freud DO on 02/12/2009   Method used:   Print then Give to Patient   RxID:   [id number] CHERATUSSIN AC 100-10 MG/5ML SYRP (GUAIFENESIN-CODEINE) 1-2  tsp by mouth at bedtime as needed cough  #6 oz x 0   Entered and Authorized by:   Loreen Freud DO   Signed by:   Loreen Freud DO on 02/12/2009   Method used:   Print then Give to Patient   RxID:   [id number]   Prevention & Chronic Care Immunizations   Influenza  vaccine: Not documented    Tetanus booster: Not documented    Pneumococcal vaccine: Not documented  Colorectal Screening   Hemoccult: Not documented    Colonoscopy: polyps, diverticulosis  (10/24/2004)   Colonoscopy due: 10/2009  Other Screening   Pap smear: Not documented    Mammogram: Not documented   Smoking status: never  (11/13/2008)  Diabetes Mellitus   HgbA1C: 6.9  (08/07/2008)   Hemoglobin A1C due: 11/07/2008    Eye exam: normal  (03/04/2008)   Eye exam due: 03/2009    Foot exam: yes  (08/07/2008)   High risk foot: Not documented   Foot care education: Not documented   Foot exam due: 08/07/2009    Urine microalbumin/creatinine ratio: 5.0  (08/07/2008)   Urine microalbumin/cr due: 08/07/2009  Lipids   Total Cholesterol: 154  (08/07/2008)   LDL: 88  (08/07/2008)   LDL Direct: Not documented   HDL: 42.60  (08/07/2008)   Triglycerides: 119.0  (08/07/2008)    SGOT (AST): 19  (08/07/2008)   SGPT (ALT): 20  (08/07/2008)   Alkaline phosphatase: 66  (08/07/2008)   Total bilirubin: 1.0  (08/07/2008)  Hypertension   Last Blood Pressure: 124 / 74  (02/12/2009)   Serum creatinine: 0.8  (08/07/2008)  Serum potassium 4.1  (08/07/2008)  Self-Management Support :    Diabetes self-management support: Not documented    Hypertension self-management support: Not documented    Lipid self-management support: Not documented

## 2010-02-17 NOTE — Progress Notes (Signed)
Summary: Lab Results   Phone Note Outgoing Call   Call placed by: Army Fossa CMA,  March 25, 2009 8:41 AM Reason for Call: Discuss lab or test results Summary of Call: Regarding lab results, LMTCB:  TG elevated--- con't meds--add antara 130 mg  #30 1 by mouth once daily , 2 refills----  give coupon to pt as well. recheck 3 months---lipid, hep 272.4 Signed by Loreen Freud DO on 03/24/2009 at 8:30 PM   Follow-up for Phone Call        Pt left a message to call her back at [phone], called pt back and left a messge. Army Fossa CMA  March 25, 2009 3:28 PM   Additional Follow-up for Phone Call Additional follow up Details #1::        Pt is aware, mailed pt a coupon. Army Fossa CMA  March 26, 2009 2:23 PM     New/Updated Medications: ANTARA 130 MG CAPS (FENOFIBRATE MICRONIZED) 1 by mouth once daily. Prescriptions: ANTARA 130 MG CAPS (FENOFIBRATE MICRONIZED) 1 by mouth once daily.  #30 x 2   Entered by:   Army Fossa CMA   Authorized by:   Loreen Freud DO   Signed by:   Army Fossa CMA on 03/26/2009   Method used:   Electronically to        [id number] (retail)       [address] [place], [place]  [address]       Ph: [phone]       Fax: [phone]   RxID:   [id number]

## 2010-02-17 NOTE — Progress Notes (Signed)
Summary: Gastroenterology  Gastroenterology   Imported By: Lamona Curl CMA (AAMA) 11/05/2009 17:11:58  _____________________________________________________________________  External Attachment:    Type:   Image     Comment:   External Document

## 2010-02-17 NOTE — Consult Note (Signed)
Summary: [facility]   Imported By: Lanelle Bal 03/10/2009 14:20:50  _____________________________________________________________________  External Attachment:    Type:   Image     Comment:   External Document

## 2010-02-17 NOTE — Progress Notes (Signed)
Summary: reaction to med  Phone Note Call from Patient   Caller: Patient Summary of Call: pt states that she has taken 2 of the celebrex and thinks that she may have a reaction to med. left message to call office to get further detail.  Initial call taken by: Jeremy Johann CMA,  April 07, 2009 9:26 AM  Follow-up for Phone Call        pt states that after taking 2  doses of the celebrex the redness has spread to her legs and thighs. Pt states that swelling has resolve. pt denies any fever or SOB. pt does not think that it is antibiotics since she did not start it to late last night. pls advise...............Marland KitchenFelecia Deloach CMA  April 07, 2009 10:07 AM     Additional Follow-up for Phone Call Additional follow up Details #1::        stop celebrex--just tylenol, can call in ultram 50 mg 1 by mouth every 6 hours as needed #30 if she needs something else Additional Follow-up by: Loreen Freud DO,  April 07, 2009 12:53 PM    Additional Follow-up for Phone Call Additional follow up Details #2::    pt will stop med and try tylenol and if no relief will call for rx................Marland KitchenFelecia Deloach CMA  April 07, 2009 1:17 PM

## 2010-02-17 NOTE — Progress Notes (Signed)
Summary: 9-26--LAB RESULT  Phone Note Outgoing Call   Details for Reason: If already taking iron daily-----increase to two times a day----GI appointment pending add hgba1c to labs please-----250.00 Summary of Call: left message to call office .......................Marland KitchenFelecia Deloach CMA  October 11, 2009 11:23 AM   patient returned call callback unable to reach left message on machine ..........Marland KitchenDoristine Devoid CMA  October 11, 2009 3:55 PM     New/Updated Medications: GLUCOPHAGE XR 500 MG XR24H-TAB (METFORMIN HCL) 2 by mouth at bedtime Prescriptions: GLUCOPHAGE XR 500 MG XR24H-TAB (METFORMIN HCL) 2 by mouth at bedtime  #60 x 2   Entered by:   Almeta Monas CMA (AAMA)   Authorized by:   Loreen Freud DO   Signed by:   Almeta Monas CMA (AAMA) on 10/12/2009   Method used:   Faxed to ...       [facility]       [address]       [place], [place]  [address]       Ph: [phone]       Fax: [phone]   RxID:   [id number]  Pt aware will f/u with labs in 3 months. Almeta Monas CMA Duncan Dull)  October 12, 2009 10:42 AM

## 2010-02-17 NOTE — Letter (Signed)
Summary: New Patient letter  [facility]  [address] [place], [place] [address]   Phone: [phone]  Fax: [phone]       10/07/2009 MRN: [id number]  [facility] [address] [place], [place]  [address]  Dear Ms. Bass,  Welcome to the Gastroenterology Division at [facility].    You are scheduled to see Dr.  Juanda Chance  on 11/09/09 at 8:45 a.m.  on the 3rd floor at [facility], [address]  We ask that you try to arrive at our office 15 minutes prior to your appointment time to allow for check-in.  We would like you to complete the enclosed self-administered evaluation form prior to your visit and bring it with you on the day of your appointment.  We will review it with you.  Also, please bring a complete list of all your medications or, if you prefer, bring the medication bottles and we will list them.  Please bring your insurance card so that we may make a copy of it.  If your insurance requires a referral to see a specialist, please bring your referral form from your primary care physician.  Co-payments are due at the time of your visit and may be paid by cash, check or credit card.     Your office visit will consist of a consult with your physician (includes a physical exam), any laboratory testing he/she may order, scheduling of any necessary diagnostic testing (e.g. x-ray, ultrasound, CT-scan), and scheduling of a procedure (e.g. Endoscopy, Colonoscopy) if required.  Please allow enough time on your schedule to allow for any/all of these possibilities.    If you cannot keep your appointment, please call [phone] to cancel or reschedule prior to your appointment date.  This allows [place] the opportunity to schedule an appointment for another patient in need of care.  If you do not cancel or reschedule by 5 p.m. the business day prior to your appointment date, you will be charged a $50.00 late cancellation/no-show fee.    Thank you for  choosing [facility] for your medical needs.  We appreciate the opportunity to care for you.  Please visit [place] at our website  to learn more about our practice.                     Sincerely,                                                             The Gastroenterology Division

## 2010-02-17 NOTE — Progress Notes (Signed)
Summary: SORE THROAT,COUGH  Phone Note Call from Patient Call back at Work Phone [phone]   Caller: Patient Call For: Loreen Freud DO Reason for Call: Talk to Nurse Complaint: Cough/Sore throat Summary of Call: PATIENT CALLING THIS MORNING C/O SORE THROAT, HACKING COUGH X1 WEEK, ORIGINALLY HAD FEVER, NO FEVER TODAY.  PATIENT IS HAVING SLEEPLESS NIGHTS, WANTS TO BE SEEN TODAY, BUT IS SCH'D TO SEE MELISSA TOMORROW, 02-12-2009.  PT REQUESTING NURSE CALL HER AT WORK PHONE. Initial call taken by: Magdalen Spatz [facility],  February 11, 2009 8:48 AM  Follow-up for Phone Call        left message to call office open at [facility] if pt would like to be seen today............Marland KitchenFelecia Deloach CMA  February 11, 2009 8:55 AM   Additional Follow-up for Phone Call Additional follow up Details #1::        pt called back decline appt for today in [place] will keep appt for tomorrow......................Marland KitchenFelecia Deloach CMA  February 11, 2009 10:39 AM

## 2010-02-17 NOTE — Assessment & Plan Note (Signed)
Summary: anxious, headache,rapid heart beat//fd   Vital Signs:  Patient profile:   55 year old female Weight:      196 pounds BMI:     31.75 Temp:     98.7 degrees F Pulse rate:   60 / minute BP sitting:   118 / 70  (right arm) CC: c/o anxiety and rapid heart beat   History of Present Illness: Pt here c/o rapid heart rate for  2 weeks ----she had had a few drinks the other night and then heart started racing --- Pt had this occur 2 weeks ago after drinking as well.  Pt admits to increased stress---her son is in [place] with air [employer] and they just had an earth quake.     Current Medications (verified): 1)  Lisinopril-Hydrochlorothiazide 10-12.5 Mg Tabs (Lisinopril-Hydrochlorothiazide) .Marland Kitchen.. 1 By Mouth Once Daily 2)  Albertsons Ec Aspirin 325 Mg  Tbec (Aspirin) 3)  Freestyle Lancets   Misc (Lancets) .... Use As Directed 4)  Freestyle Lite   Strp (Glucose Blood) .... Use As Directed 5)  Flonase 50 Mcg/act  Susp (Fluticasone Propionate) .... 2 Sprays Each Nostril Once Daily 6)  Vit-D and Calcium 600 7)  Pravachol 40 Mg Tabs (Pravastatin Sodium) .... Take 1 By Mouth At Bedtime 8)  Glucophage Xr 500 Mg Xr24h-Tab (Metformin Hcl) .Marland Kitchen.. 1 By Mouth Once Daily. Needs Labwork. 9)  Cheratussin Ac 100-10 Mg/37ml Syrp (Guaifenesin-Codeine) .Marland Kitchen.. 1-2  Tsp By Mouth At Bedtime As Needed Cough 10)  Xyzal 5 Mg Tabs (Levocetirizine Dihydrochloride) .Marland Kitchen.. 1 By Mouth Once Daily As Needed Allergies 11)  Fenofibrate Micronized 134 Mg Caps (Fenofibrate Micronized) .... Take 1 Tab Once Daily 12)  Fish Oil 13)  Alprazolam 0.25 Mg Tabs (Alprazolam) .... 1/2 -1 By Mouth Three Times A Day As Needed  Allergies (verified): No Known Drug Allergies  Past History:  Past medical, surgical, family and social histories (including risk factors) reviewed for relevance to current acute and chronic problems.  Past Medical History: Reviewed history from 11/13/2008 and no changes required. Hypertension Diabetes mellitus,  type II (11/2006) Allergic rhinitis Colitis  Past Surgical History: Reviewed history from 04/12/2006 and no changes required. Inguinal herniorrhaphy X 3 Hemorrhoidectomy  Family History: Reviewed history from 11/13/2008 and no changes required. No FH of Colon Cancer: Family History of Breast Cancer:Mother Throat cancer: Aunt Family History of Diabetes: Mother, Grandmother  Social History: Reviewed history from 11/13/2008 and no changes required. Divorced Patient has never smoked.  Alcohol Use - yes Daily Caffeine Use Illicit Drug Use - no  Review of Systems      See HPI  Physical Exam  General:  Well-developed,well-nourished,in no acute distress; alert,appropriate and cooperative throughout examination Ears:  External ear exam shows no significant lesions or deformities.  Otoscopic examination reveals clear canals, tympanic membranes are intact bilaterally without bulging, retraction, inflammation or discharge. Hearing is grossly normal bilaterally. Nose:  External nasal examination shows no deformity or inflammation. Nasal mucosa are pink and moist without lesions or exudates. Mouth:  Oral mucosa and oropharynx without lesions or exudates.  Teeth in good repair. Neck:  No deformities, masses, or tenderness noted. Lungs:  Normal respiratory effort, chest expands symmetrically. Lungs are clear to auscultation, no crackles or wheezes. Heart:  normal rate and no murmur.   Abdomen:  Bowel sounds positive,abdomen soft and non-tender without masses, organomegaly or hernias noted. Msk:  normal ROM and no joint swelling.   Extremities:  No clubbing, cyanosis, edema, or deformity noted with normal full  range of motion of all joints.   Neurologic:  No cranial nerve deficits noted. Station and gait are normal. Plantar reflexes are down-going bilaterally. DTRs are symmetrical throughout. Sensory, motor and coordinative functions appear intact. Skin:  Intact without suspicious lesions or  rashes Cervical Nodes:  No lymphadenopathy noted Psych:  Cognition and judgment appear intact. Alert and cooperative with normal attention span and concentration. No apparent delusions, illusions, hallucinations   Impression & Recommendations:  Problem # 1:  PALPITATIONS (ICD-785.1) ? anxiety--- xanax 0.25 mg three times a day as needed ---- check labs pt did not want to do anymore testing at this time--  if labs normal and xanax does not help--consider event monitor Orders: Venipuncture (41324) TLB-BMP (Basic Metabolic Panel-BMET) (80048-METABOL) TLB-CBC Platelet - w/Differential (85025-CBCD) TLB-TSH (Thyroid Stimulating Hormone) (84443-TSH) TLB-Hepatic/Liver Function Pnl (80076-HEPATIC) Specimen Handling (40102) EKG w/ Interpretation (93000)  Avoid caffeine, chocolate, and stimulants. Stress reduction as well as medication options discussed.   Complete Medication List: 1)  Lisinopril-hydrochlorothiazide 10-12.5 Mg Tabs (Lisinopril-hydrochlorothiazide) .Marland Kitchen.. 1 by mouth once daily 2)  Albertsons Ec Aspirin 325 Mg Tbec (Aspirin) 3)  Freestyle Lancets Misc (Lancets) .... Use as directed 4)  Freestyle Lite Strp (Glucose blood) .... Use as directed 5)  Flonase 50 Mcg/act Susp (Fluticasone propionate) .... 2 sprays each nostril once daily 6)  Vit-d and Calcium 600  7)  Pravachol 40 Mg Tabs (Pravastatin sodium) .... Take 1 by mouth at bedtime 8)  Glucophage Xr 500 Mg Xr24h-tab (Metformin hcl) .Marland Kitchen.. 1 by mouth once daily. needs labwork. 9)  Cheratussin Ac 100-10 Mg/18ml Syrp (Guaifenesin-codeine) .Marland Kitchen.. 1-2  tsp by mouth at bedtime as needed cough 10)  Xyzal 5 Mg Tabs (Levocetirizine dihydrochloride) .Marland Kitchen.. 1 by mouth once daily as needed allergies 11)  Fenofibrate Micronized 134 Mg Caps (Fenofibrate micronized) .... Take 1 tab once daily 12)  Fish Oil  13)  Alprazolam 0.25 Mg Tabs (Alprazolam) .... 1/2 -1 by mouth three times a day as needed Prescriptions: ALPRAZOLAM 0.25 MG TABS (ALPRAZOLAM)  1/2 -1 by mouth three times a day as needed  #30 x 0   Entered and Authorized by:   Loreen Freud DO   Signed by:   Loreen Freud DO on 09/06/2009   Method used:   Print then Give to Patient   RxID:   [id number]    EKG  Procedure date:  09/06/2009  Findings:      Normal sinus rhythm with rate of:  77 bpm    Laboratory Results   Blood Tests     Glucose (random): 125 mg/dL   (Normal Range: 56-387)

## 2010-02-17 NOTE — Procedures (Signed)
Summary: Colonoscopy  Patient: [name] Note: All result statuses are Final unless otherwise noted.  Tests: (1) Colonoscopy (COL)   COL Colonoscopy           DONE     [facility]     [address]     [place], [place]  [address]           COLONOSCOPY PROCEDURE REPORT           PATIENT:  Megan [name], Megan [name]  MR#:  [id number]     BIRTHDATE:  September 28, 1955, 54 yrs. old  GENDER:  female     ENDOSCOPIST:  Hedwig Morton. Juanda Chance, MD     REF. BY:  Richarda Overlie, M.D.     PROCEDURE DATE:  11/17/2009     PROCEDURE:  Colonoscopy 19147     ASA CLASS:  Class II     INDICATIONS:  Iron deficiency anemia     MEDICATIONS:   Versed 8 mg, Fentanyl 75 mcg           DESCRIPTION OF PROCEDURE:   After the risks benefits and     alternatives of the procedure were thoroughly explained, informed     consent was obtained.  Digital rectal exam was performed and     revealed no rectal masses.   The LB [id number] endoscope     was introduced through the anus and advanced to the cecum, which     was identified by both the appendix and ileocecal valve, without     limitations.  The quality of the prep was poor, using MiraLax.     The instrument was then slowly withdrawn as the colon was fully     examined.     <<PROCEDUREIMAGES>>           FINDINGS:  Moderate diverticulosis was found throughout the colon     (see image1, image2, and image7).  This was otherwise a normal     examination of the colon (see image8, image3, image4, and image5).     Retroflexed views in the rectum revealed no abnormalities.    The     scope was then withdrawn from the patient and the procedure     completed.           COMPLICATIONS:  None     ENDOSCOPIC IMPRESSION:     1) Moderate diverticulosis throughout the colon     2) Otherwise normal examination     RECOMMENDATIONS:     1) high fiber diet     follow H/H and stool cards for occulgt blood     continue Iron supplements     REPEAT EXAM:  In 10 year(s)  for.           ______________________________     Hedwig Morton. Juanda Chance, MD           CC:  Lelon Perla, DO           n.     eSIGNED:   Hedwig Morton. Brodie at 11/17/2009 12:20 PM           Ellizabeth, Dacruz, [phone]  Note: An exclamation mark (!) indicates a result that was not dispersed into the flowsheet. Document Creation Date: 11/17/2009 12:20 PM _______________________________________________________________________  (1) Order result status: Final Collection or observation date-time: 11/17/2009 12:08 Requested date-time:  Receipt date-time:  Reported date-time:  Referring Physician:   Ordering Physician: Lina Sar [phone]) Specimen Source:  Source: Launa Grill Order Number: [id number] Lab  site:   Appended Document: Colonoscopy    Clinical Lists Changes  Observations: Added new observation of COLONNXTDUE: 11/2019 (11/17/2009 13:52)

## 2010-02-17 NOTE — Progress Notes (Signed)
Summary: referral  Phone Note Call from Patient Call back at Work Phone [phone]   Caller: Patient Summary of Call: pt states that she has been having pain in her little toe every time she put on shoes. pt states that she would like a referral to a podiatry. pt does have DM are you ok with referring her or would you like for her to come in for OV first..................Marland KitchenFelecia Deloach CMA  March 01, 2009 11:40 AM   Follow-up for Phone Call        ok to refer Follow-up by: Loreen Freud DO,  March 01, 2009 12:13 PM  Additional Follow-up for Phone Call Additional follow up Details #1::        left message to call office..............Marland KitchenFelecia Deloach CMA  March 01, 2009 2:35 PM     Additional Follow-up for Phone Call Additional follow up Details #2::    pt aware of referral awaiting appt info................Marland KitchenFelecia Deloach CMA  March 01, 2009 3:29 PM

## 2010-02-17 NOTE — Op Note (Signed)
Summary: Ventral Hernia Repair    NAME:  Megan [name], Megan [name]                      ACCOUNT NO.:  [id number]   MEDICAL RECORD NO.:  [id number]                   PATIENT TYPE:  OIB   LOCATION:  2550                                 FACILITY:  [facility]   PHYSICIAN:  Luisa Hart L. Lurene Shadow, M.D.             DATE OF BIRTH:  10-Mar-1955   DATE OF PROCEDURE:  09/18/2001  DATE OF DISCHARGE:                                 OPERATIVE REPORT   PREOPERATIVE DIAGNOSIS:  Recurrent ventral hernia.   POSTOPERATIVE DIAGNOSIS:  Recurrent ventral hernia.   PROCEDURE:  Repair of recurrent ventral hernia with mesh.   SURGEON:  Mardene Celeste. Lurene Shadow, M.D.   ASSISTANT:  Nurse.   ANESTHESIA:  General.   CLINICAL NOTE:  The patient is a 55 year old woman who has had two previous  ventral hernia repairs in a region adjacent to the umbilicus.  She returns  now with a new recurrence, which extends somewhat laterally onto the left  side.  The risks and potential benefits of surgery have been fully discussed  with the patient.  She gives consent and comes now to surgery.   DESCRIPTION OF PROCEDURE:  Following the induction of satisfactory general  anesthesia with the patient positioned supinely, the abdomen was routinely  prepped and draped to be included in the sterile operative field.  A midline  incision through the old scar cicatrix, skirting to the umbilicus to the  left.  This carried down through the skin and subcutaneous tissue and  dissection carried down to the region of the hernia.  The hernia is  dissected free from surrounding tissues and carried down to the fascia.  The  fascia is scored open and the incarcerated omentum within the hernia sac is  then freed and a portion of it is amputated and sent with the sac, and the  remainder is clamped, suture ligated, and returned to the peritoneal cavity.  The abdominal wall defect is then closed with a mesh plug, and this is then  sewn into the fascia with  interrupted 0 Novofil sutures.  At the end of the  dissection, the repair is noted to be intact.  Sponge, instrument, and sharp  counts were doubly verified.  The subcutaneous tissue is closed with a  running 2-0 Vicryl suture, skin closed with a running 4-0 Monocryl suture  and then reinforced with Steri-Strips, sterile dressings applied.  Anesthetic reversed.  The patient removed from the operating room to the  recovery room in stable condition.  She tolerated the procedure well.                                               Mardene Celeste Lurene Shadow, M.D.    PLB/MEDQ  D:  09/18/2001  T:  09/18/2001  Job:  [id number]

## 2010-02-17 NOTE — Progress Notes (Signed)
Summary: --med not dissolving  Phone Note Refill Request Call back at Work Phone [phone]   Refills Requested: Medication #1:  GLUCOPHAGE XR 500 MG XR24H-TAB 2 by mouth at bedtime Pt states that med is not dissolving in her system. Pt notes that when she has BM in the morning med are appearing in her stools. Pt BS have been fluctuating since she notice that meds are not dissolving. Pt wonders if maybe she got a bad bach of meds but when she contacted the pharmacy they assured her that med were fresh.Pt BS fasting [id number], and  [id number]. Pt usually range 117 fasting Pls advise...............Marland KitchenFelecia Deloach CMA  January 07, 2010 9:37 AM     Follow-up for Phone Call        are they tabs or capsules?  you can excrete 'ghost capsules' which is the gel coating but the meds have dissolved.  if they are tabs I'm not sure why they aren't digesting.  can switch to metformin 1000mg  two times a day to see if these are better absorbed Follow-up by: Neena Rhymes MD,  January 07, 2010 9:47 AM  Additional Follow-up for Phone Call Additional follow up Details #1::        Left message to call office.............Marland KitchenFelecia Deloach CMA  January 07, 2010 9:56 AM   [facility]  Pt aware..........Marland KitchenFelecia Deloach CMA  January 07, 2010 10:24 AM     New/Updated Medications: GLUCOPHAGE 1000 MG TABS (METFORMIN HCL) Take 1 tab  two times a day Prescriptions: GLUCOPHAGE 1000 MG TABS (METFORMIN HCL) Take 1 tab  two times a day  #60 x 0   Entered by:   Jeremy Johann CMA   Authorized by:   Neena Rhymes MD   Signed by:   Jeremy Johann CMA on 01/07/2010   Method used:   F[facility]ed to ...       [facility]       [address]       [place], [place]  [address]       Ph: [phone]       Fax: [phone]   RxID:   [id number]

## 2010-02-17 NOTE — Assessment & Plan Note (Signed)
Summary: wrist is swollen/kdc   Vital Signs:  Patient profile:   55 year old female Weight:      196 pounds Pulse rate:   86 / minute Pulse rhythm:   regular BP sitting:   124 / 80  (left arm) Cuff size:   regular  Vitals Entered By: Army Fossa CMA (April 06, 2009 9:29 AM) CC: Pt here c/o right writst being swollen and red, has been swollen x 2 days.    History of Present Illness: Pt here c/o R wrist being swollen for last few days,  red and tender.  Pt denies any injury, No hx of gout.    Current Medications (verified): 1)  Lisinopril-Hydrochlorothiazide 10-12.5 Mg Tabs (Lisinopril-Hydrochlorothiazide) .Marland Kitchen.. 1 By Mouth Once Daily 2)  [employer] Ec Aspirin 325 Mg  Tbec (Aspirin) 3)  Freestyle Lancets   Misc (Lancets) .... Use As Directed 4)  Freestyle Lite   Strp (Glucose Blood) .... Use As Directed 5)  Flonase 50 Mcg/act  Susp (Fluticasone Propionate) .... 2 Sprays Each Nostril Once Daily 6)  Vit-D and Calcium 600 7)  Pravachol 40 Mg Tabs (Pravastatin Sodium) .... Take 1 By Mouth At Bedtime 8)  Glucophage Xr 500 Mg Xr24h-Tab (Metformin Hcl) .Marland Kitchen.. 1 By Mouth Once Daily. Needs Labwork. 9)  Cheratussin Ac 100-10 Mg/84ml Syrp (Guaifenesin-Codeine) .Marland Kitchen.. 1-2  Tsp By Mouth At Bedtime As Needed Cough 10)  Xyzal 5 Mg Tabs (Levocetirizine Dihydrochloride) .Marland Kitchen.. 1 By Mouth Once Daily As Needed Allergies 11)  Antara 130 Mg Caps (Fenofibrate Micronized) .Marland Kitchen.. 1 By Mouth Once Daily. 12)  Fish Oil 13)  Keflex 500 Mg Caps (Cephalexin) .Marland Kitchen.. 1 By Mouth Two Times A Day  Allergies (verified): No Known Drug Allergies  Past History:  Past medical, surgical, family and social histories (including risk factors) reviewed for relevance to current acute and chronic problems.  Past Medical History: Reviewed history from 11/13/2008 and no changes required. Hypertension Diabetes mellitus, type II (11/2006) Allergic rhinitis Colitis  Past Surgical History: Reviewed history from 04/12/2006 and no  changes required. Inguinal herniorrhaphy X 3 Hemorrhoidectomy  Family History: Reviewed history from 11/13/2008 and no changes required. No FH of Colon Cancer: Family History of Breast Cancer:Mother Throat cancer: Aunt Family History of Diabetes: Mother, Grandmother  Social History: Reviewed history from 11/13/2008 and no changes required. Divorced Patient has never smoked.  Alcohol Use - yes Daily Caffeine Use Illicit Drug Use - no  Review of Systems      See HPI  Physical Exam  General:  Well-developed,well-nourished,in no acute distress; alert,appropriate and cooperative throughout examination Extremities:  R wrist swollen and hot to touch no pain with palpation--some pain with movement Skin:  Intact without suspicious lesions or rashes Cervical Nodes:  No lymphadenopathy noted Psych:  Cognition and judgment appear intact. Alert and cooperative with normal attention span and concentration. No apparent delusions, illusions, hallucinations   Impression & Recommendations:  Problem # 1:  WRIST PAIN, RIGHT (ICD-719.43) Gout vs cellulitis check xray check labs celebrex 200mg  1 by mouth once daily as needed  keflex for 10 days  call or rto prn Orders: Venipuncture ([address]) TLB-BMP (Basic Metabolic Panel-BMET) (80048-METABOL) TLB-CBC Platelet - w/Differential (85025-CBCD) TLB-Uric Acid, Blood (84550-URIC) T-Wrist Comp Right (73110TC) Venipuncture (81191) TLB-Rheumatoid Factor (RA) (47829-FA) TLB-Sedimentation Rate (ESR) (85652-ESR) T-Antinuclear Antib (ANA) (21308-65784)  Complete Medication List: 1)  Lisinopril-hydrochlorothiazide 10-12.5 Mg Tabs (Lisinopril-hydrochlorothiazide) .Marland Kitchen.. 1 by mouth once daily 2)  [employer] Ec Aspirin 325 Mg Tbec (Aspirin) 3)  Freestyle Lancets Misc (Lancets) .[facility]  Use as directed 4)  Freestyle Lite Strp (Glucose blood) .... Use as directed 5)  Flonase 50 Mcg/act Susp (Fluticasone propionate) .... 2 sprays each nostril once daily 6)   Vit-d and Calcium 600  7)  Pravachol 40 Mg Tabs (Pravastatin sodium) .... Take 1 by mouth at bedtime 8)  Glucophage Xr 500 Mg Xr24h-tab (Metformin hcl) .Marland Kitchen.. 1 by mouth once daily. needs labwork. 9)  Cheratussin Ac 100-10 Mg/56ml Syrp (Guaifenesin-codeine) .Marland Kitchen.. 1-2  tsp by mouth at bedtime as needed cough 10)  Xyzal 5 Mg Tabs (Levocetirizine dihydrochloride) .Marland Kitchen.. 1 by mouth once daily as needed allergies 11)  Antara 130 Mg Caps (Fenofibrate micronized) .Marland Kitchen.. 1 by mouth once daily. 12)  Fish Oil  13)  Keflex 500 Mg Caps (Cephalexin) .Marland Kitchen.. 1 by mouth two times a day Prescriptions: ANTARA 130 MG CAPS (FENOFIBRATE MICRONIZED) 1 by mouth once daily.  #30 x 2   Entered and Authorized by:   Loreen Freud DO   Signed by:   Loreen Freud DO on 04/06/2009   Method used:   Electronically to        [facility] (retail)       [address]       [place], [place]  [address]       Ph: [phone]       Fax: [phone]   RxID:   [id number] GLUCOPHAGE XR 500 MG XR24H-TAB (METFORMIN HCL) 1 by mouth once daily. NEEDS LABWORK.  #90 x 3   Entered and Authorized by:   Loreen Freud DO   Signed by:   Loreen Freud DO on 04/06/2009   Method used:   Electronically to        [facility] (retail)       [address]       [place], [place]  [address]       Ph: [phone]       Fax: [phone]   RxID:   [id number] PRAVACHOL 40 MG TABS (PRAVASTATIN SODIUM) take 1 by mouth at bedtime  #90 x 3   Entered and Authorized by:   Loreen Freud DO   Signed by:   Loreen Freud DO on 04/06/2009   Method used:   Electronically to        [facility] (retail)       [address]       [place], [place]  [address]       Ph: [phone]       Fax: [phone]   RxID:   [id number] LISINOPRIL-HYDROCHLOROTHIAZIDE 10-12.5 MG TABS (LISINOPRIL-HYDROCHLOROTHIAZIDE) 1 by mouth once daily  #90 x 3   Entered and Authorized by:    Loreen Freud DO   Signed by:   Loreen Freud DO on 04/06/2009   Method used:   Electronically to        [facility] (retail)       [address]       [place], [place]  [address]       Ph: [phone]       Fax: [phone]   RxID:   [id number] KEFLEX 500 MG CAPS (CEPHALEXIN) 1 by mouth two times a day  #20 x 0   Entered and Authorized by:   Loreen Freud DO  Signed by:   Loreen Freud DO on 04/06/2009   Method used:   Electronically to        [facility] (retail)       [address]       [place], [place]  [address]       Ph: [phone]       Fax: [phone]   RxID:   [id number]

## 2010-03-21 ENCOUNTER — Telehealth (INDEPENDENT_AMBULATORY_CARE_PROVIDER_SITE_OTHER): Payer: Self-pay | Admitting: *Deleted

## 2010-03-23 ENCOUNTER — Ambulatory Visit: Payer: Self-pay | Admitting: Family Medicine

## 2010-03-28 ENCOUNTER — Ambulatory Visit (INDEPENDENT_AMBULATORY_CARE_PROVIDER_SITE_OTHER): Payer: 59 | Admitting: Family Medicine

## 2010-03-28 ENCOUNTER — Other Ambulatory Visit: Payer: Self-pay | Admitting: Family Medicine

## 2010-03-28 ENCOUNTER — Encounter: Payer: Self-pay | Admitting: Family Medicine

## 2010-03-28 DIAGNOSIS — L659 Nonscarring hair loss, unspecified: Secondary | ICD-10-CM

## 2010-03-28 DIAGNOSIS — I10 Essential (primary) hypertension: Secondary | ICD-10-CM

## 2010-03-28 DIAGNOSIS — E785 Hyperlipidemia, unspecified: Secondary | ICD-10-CM

## 2010-03-28 DIAGNOSIS — D649 Anemia, unspecified: Secondary | ICD-10-CM

## 2010-03-28 DIAGNOSIS — E119 Type 2 diabetes mellitus without complications: Secondary | ICD-10-CM

## 2010-03-28 LAB — HEPATIC FUNCTION PANEL
ALT: 23 U/L (ref 0–35)
AST: 21 U/L (ref 0–37)
Albumin: 3.7 g/dL (ref 3.5–5.2)
Alkaline Phosphatase: 65 U/L (ref 39–117)
Total Protein: 7.2 g/dL (ref 6.0–8.3)

## 2010-03-28 LAB — CBC WITH DIFFERENTIAL/PLATELET
Basophils Relative: 0.6 % (ref 0.0–3.0)
Eosinophils Absolute: 0.1 10*3/uL (ref 0.0–0.7)
Hemoglobin: 11.7 g/dL — ABNORMAL LOW (ref 12.0–15.0)
Lymphs Abs: 2.4 10*3/uL (ref 0.7–4.0)
MCHC: 32.4 g/dL (ref 30.0–36.0)
MCV: 70.1 fl — ABNORMAL LOW (ref 78.0–100.0)
Monocytes Absolute: 0.5 10*3/uL (ref 0.1–1.0)
Neutro Abs: 4.7 10*3/uL (ref 1.4–7.7)
Neutrophils Relative %: 59.9 % (ref 43.0–77.0)
RBC: 5.17 Mil/uL — ABNORMAL HIGH (ref 3.87–5.11)

## 2010-03-28 LAB — LIPID PANEL
Total CHOL/HDL Ratio: 4
Triglycerides: 137 mg/dL (ref 0.0–149.0)

## 2010-03-28 LAB — MICROALBUMIN / CREATININE URINE RATIO: Microalb, Ur: 3 mg/dL — ABNORMAL HIGH (ref 0.0–1.9)

## 2010-03-28 LAB — BASIC METABOLIC PANEL
CO2: 28 mEq/L (ref 19–32)
Chloride: 104 mEq/L (ref 96–112)
Creatinine, Ser: 0.8 mg/dL (ref 0.4–1.2)

## 2010-03-29 LAB — GLUCOSE, CAPILLARY: Glucose-Capillary: 121 mg/dL — ABNORMAL HIGH (ref 70–99)

## 2010-03-29 NOTE — Progress Notes (Signed)
Summary: refill  Phone Note Refill Request Message from:  Fax from Pharmacy on March 21, 2010 10:49 AM  Refills Requested: Medication #1:  GLUCOPHAGE 1000 MG TABS Take 1 tab  two times a day [facility] - fax [phone]  Initial call taken by: Okey Regal [place],  March 21, 2010 10:49 AM    New/Updated Medications: GLUCOPHAGE 1000 MG TABS (METFORMIN HCL) Take 1 tab  two times a day* LABS ARE DUE NOW_CALL FOR AN APPT* Prescriptions: GLUCOPHAGE 1000 MG TABS (METFORMIN HCL) Take 1 tab  two times a day* LABS ARE DUE NOW_CALL FOR AN APPT*  #60 x 0   Entered by:   Almeta Monas CMA (AAMA)   Authorized by:   Loreen Freud DO   Signed by:   Almeta Monas CMA (AAMA) on 03/21/2010   Method used:   F[facility]ed to ...       [facility]       [address]       [place], [place]  [address]       Ph: [phone]       Fax: [phone]   RxID:   [id number]

## 2010-04-05 NOTE — Letter (Signed)
Summary: Glucose readings  Glucose readings   Imported By: Kassie Mends 03/31/2010 08:31:38  _____________________________________________________________________  External Attachment:    Type:   Image     Comment:   External Document

## 2010-04-05 NOTE — Assessment & Plan Note (Signed)
Summary: meds making hair fall out/bp check/nta   Vital Signs:  Patient profile:   55 year old female Weight:      189 pounds Pulse rate:   84 / minute Pulse rhythm:   regular BP sitting:   110 / 68  (left arm) Cuff size:   large  Vitals Entered By: Almeta Monas CMA Duncan Dull) (March 28, 2010 8:50 AM) CC: med check--Fasting labs due--c/o hair loss from meds   History of Present Illness:  Type 1 diabetes mellitus follow-up      This is a 55 year old woman who presents with Type 2 diabetes mellitus follow-up.  The patient denies polyuria, polydipsia, blurred vision, self managed hypoglycemia, hypoglycemia requiring help, weight loss, weight gain, and numbness of extremities.  The patient denies the following symptoms: neuropathic pain, chest pain, vomiting, orthostatic symptoms, poor wound healing, intermittent claudication, vision loss, and foot ulcer.  Since the last visit the patient reports good dietary compliance, compliance with medications, exercising regularly, and monitoring blood glucose.  The patient has been measuring capillary blood glucose before breakfast.  Since the last visit, the patient reports having had eye care by an ophthalmologist and foot care by a podiatrist.    Hyperlipidemia follow-up      The patient also presents for Hyperlipidemia follow-up.  The patient denies muscle aches, GI upset, abdominal pain, flushing, itching, constipation, diarrhea, and fatigue.  The patient denies the following symptoms: chest pain/pressure, exercise intolerance, dypsnea, palpitations, syncope, and pedal edema.  Compliance with medications (by patient report) has been near 100%.  Dietary compliance has been good.  The patient reports exercising occasionally.  Adjunctive measures currently used by the patient include fish oil supplements.    Hypertension follow-up      The patient also presents for Hypertension follow-up.  The patient denies lightheadedness, urinary frequency, headaches,  edema, impotence, rash, and fatigue.  The patient denies the following associated symptoms: chest pain, chest pressure, exercise intolerance, dyspnea, palpitations, syncope, leg edema, and pedal edema.  Compliance with medications (by patient report) has been near 100%.  The patient reports that dietary compliance has been good.  The patient reports exercising occasionally.  Adjunctive measures currently used by the patient include salt restriction.    Problems Prior to Update: 1)  Hair Loss  (ICD-704.00) 2)  Colonic Polyps, Hx of  (ICD-V12.72) 3)  Fatty Liver Disease  (ICD-571.8) 4)  Hemangioma, Hepatic  (ICD-228.04) 5)  Diverticulosis, Colon  (ICD-562.10) 6)  Liver Function Tests, Abnormal, Hx of  (ICD-V12.2) 7)  Unspecified Anemia  (ICD-285.9) 8)  Palpitations  (ICD-785.1) 9)  Wrist Pain, Right  (ICD-719.43) 10)  Hx of Colitis, Hx of  (ICD-V12.79) 11)  Colitis  (ICD-558.9) 12)  Sinusitis - Acute-nos  (ICD-461.9) 13)  Fatigue  (ICD-780.79) 14)  Allergic Rhinitis  (ICD-477.9) 15)  Dysmetabolic Syndrome X  (ICD-277.7) 16)  Hypertension, Essential Nos  (ICD-401.9) 17)  Diabetes Mellitus, Type II  (ICD-250.00) 18)  Acute Bronchitis  (ICD-466.0) 19)  Uri  (ICD-465.9) 20)  Hypertension  (ICD-401.9) 21)  Epistaxis  (ICD-784.7) 22)  Hypertension  (ICD-401.9) 23)  Hyperlipidemia  (ICD-272.4)  Medications Prior to Update: 1)  Lisinopril-Hydrochlorothiazide 10-12.5 Mg Tabs (Lisinopril-Hydrochlorothiazide) .Marland Kitchen.. 1 By Mouth Once Daily 2)  [employer] Ec Aspirin 325 Mg  Tbec (Aspirin) .... By Mouth Once Daily 3)  Freestyle Lancets   Misc (Lancets) .... Use As Directed 4)  Freestyle Lite   Strp (Glucose Blood) .... Use As Directed 5)  Flonase 50 Mcg/act  Susp (Fluticasone Propionate) .... 2 Sprays Each Nostril Once Daily 6)  Calcium-Vitamin D 600-200 Mg-Unit Tabs (Calcium-Vitamin D) .... One Tablet By Mouth Once Daily 7)  Glucophage 1000 Mg Tabs (Metformin Hcl) .... Take 1 Tab  Two Times A Day*  Labs Are Due Now_call For An Appt* 8)  Cheratussin Ac 100-10 Mg/30ml Syrp (Guaifenesin-Codeine) .Marland Kitchen.. 1-2  Tsp By Mouth At Bedtime As Needed Cough 9)  Xyzal 5 Mg Tabs (Levocetirizine Dihydrochloride) .Marland Kitchen.. 1 By Mouth Once Daily As Needed Allergies 10)  Fenofibrate Micronized 134 Mg Caps (Fenofibrate Micronized) .... Take 1 Tab Once Daily 11)  Fish Oil 1000 Mg Caps (Omega-3 Fatty Acids) .... One Capsule By Mouth Once Daily 12)  Alprazolam 0.25 Mg Tabs (Alprazolam) .... 1/2 -1 By Mouth Three Times A Day As Needed 13)  Vitron-C 200-125 Mg Tabs (Ferrous Fumarate-Vitamin C) .... One Tablet By Mouth Once Daily 14)  Meloxicam 15 Mg Tabs (Meloxicam) .... As Needed  Current Medications (verified): 1)  Lisinopril 10 Mg Tabs (Lisinopril) .Marland Kitchen.. 1 By Mouth Once Daily 2)  Freestyle Lancets   Misc (Lancets) .... Use As Directed 3)  Freestyle Lite   Strp (Glucose Blood) .... Use As Directed 4)  Flonase 50 Mcg/act  Susp (Fluticasone Propionate) .... 2 Sprays Each Nostril Once Daily 5)  Glucophage 1000 Mg Tabs (Metformin Hcl) .... Take 1 Tab  Two Times A Day* Labs Are Due Now_call For An Appt* 6)  Fish Oil 1000 Mg Caps (Omega-3 Fatty Acids) .... One Capsule By Mouth Once Daily 7)  Alprazolam 0.25 Mg Tabs (Alprazolam) .... 1/2 -1 By Mouth Three Times A Day As Needed 8)  Bd Assure Bpm/auto Arm Cuff  Misc (Blood Pressure Monitoring) .... Monitor Bp As Needed  Allergies (verified): 1)  ! Asa  Past History:  Past medical, surgical, family and social histories (including risk factors) reviewed for relevance to current acute and chronic problems.  Past Medical History: Reviewed history from 11/09/2009 and no changes required. Hypertension Diabetes mellitus, type II (11/2006) Allergic rhinitis Infectious Colitis Diverticulosis Hyperlipidemia Colon Polyps  Past Surgical History: Reviewed history from 11/09/2009 and no changes required. Inguinal herniorrhaphy X 3 Hemorrhoidectomy Ventral Hernia Repair Right  Foot Surgery   Family History: Reviewed history from 11/09/2009 and no changes required. No FH of Colon Cancer: Family History of Breast Cancer:Mother Throat cancer: Aunt Family History of Diabetes: Mother, Grandmother,  Brother Family History of Heart Disease: Father  Social History: Reviewed history from 11/09/2009 and no changes required. [employer]  Divorced Daily Caffeine Use Illicit Drug Use - no Patient has never smoked.  Alcohol Use - no  Review of Systems      See HPI  Physical Exam  General:  Well-developed,well-nourished,in no acute distress; alert,appropriate and cooperative throughout examination Head:  some hair loss  Neck:  No deformities, masses, or tenderness noted. Lungs:  Normal respiratory effort, chest expands symmetrically. Lungs are clear to auscultation, no crackles or wheezes. Heart:  normal rate and no murmur.   Extremities:  No clubbing, cyanosis, edema, or deformity noted with normal full range of motion of all joints.   Psych:  Oriented X3, normally interactive, and good eye contact.    Diabetes Management Exam:    Foot Exam (with socks and/or shoes not present):       Sensory-Pinprick/Light touch:          Left medial foot (L-4): normal          Left dorsal foot (L-5): normal  Left lateral foot (S-1): normal          Right medial foot (L-4): normal          Right dorsal foot (L-5): normal          Right lateral foot (S-1): normal       Sensory-Monofilament:          Left foot: normal          Right foot: normal       Inspection:          Left foot: normal          Right foot: normal       Nails:          Left foot: normal          Right foot: normal    Eye Exam:       Eye Exam done elsewhere   Impression & Recommendations:  Problem # 1:  HAIR LOSS (ICD-704.00)  Orders: TLB-TSH (Thyroid Stimulating Hormone) (84443-TSH) TLB-B12 + Folate Pnl (16109_60454-U98/JXB) TLB-Ferritin (82728-FER) TLB-A1C / Hgb  A1C (Glycohemoglobin) (83036-A1C) TLB-Microalbumin/Creat Ratio, Urine (82043-MALB)  Problem # 2:  UNSPECIFIED ANEMIA (ICD-285.9)  The following medications were removed from the medication list:    Vitron-c 200-125 Mg Tabs (Ferrous fumarate-vitamin c) ..... One tablet by mouth once daily  Orders: Venipuncture (14782) TLB-Lipid Panel (80061-LIPID) TLB-BMP (Basic Metabolic Panel-BMET) (80048-METABOL) TLB-CBC Platelet - w/Differential (85025-CBCD) TLB-IBC Pnl (Iron/FE;Transferrin) (83550-IBC) TLB-Hepatic/Liver Function Pnl (80076-HEPATIC) TLB-TSH (Thyroid Stimulating Hormone) (84443-TSH) Specimen Handling (95621)  The following medications were removed from the medication list:    Vitron-c 200-125 Mg Tabs (Ferrous fumarate-vitamin c) ..... One tablet by mouth once daily  Hgb: 11.4 (10/08/2009)   Hct: 35.0 (10/08/2009)   Platelets: 342.0 (10/08/2009) RBC: 5.03 (10/08/2009)   RDW: 14.9 (10/08/2009)   WBC: 7.9 (10/08/2009) MCV: 69.6 L fl (10/08/2009)   MCHC: 32.7 (10/08/2009) Iron: 51 (10/08/2009)   % Sat: 20.3 (10/08/2009) B12: 309 (10/08/2009)   Folate: >20.0 ng/mL (08/07/2008)   TSH: 0.92 (09/06/2009)  Problem # 3:  HYPERTENSION, ESSENTIAL NOS (ICD-401.9)  Her updated medication list for this problem includes:    Lisinopril 10 Mg Tabs (Lisinopril) .Marland Kitchen... 1 by mouth once daily  Orders: Venipuncture (30865) TLB-Lipid Panel (80061-LIPID) TLB-BMP (Basic Metabolic Panel-BMET) (80048-METABOL) TLB-CBC Platelet - w/Differential (85025-CBCD) TLB-IBC Pnl (Iron/FE;Transferrin) (83550-IBC) TLB-Hepatic/Liver Function Pnl (80076-HEPATIC) TLB-TSH (Thyroid Stimulating Hormone) (84443-TSH) TLB-B12 + Folate Pnl (78469_62952-W41/LKG) TLB-Ferritin (82728-FER) TLB-A1C / Hgb A1C (Glycohemoglobin) (83036-A1C) TLB-Microalbumin/Creat Ratio, Urine (82043-MALB)  BP today: 110/68 Prior BP: 128/74 (11/09/2009)  Labs Reviewed: K+: 3.6 (09/06/2009) Creat: : 0.7 (09/06/2009)   Chol: 175 (03/16/2009)    HDL: 49.30 (03/16/2009)   LDL: 93 (03/16/2009)   TG: 164.0 (03/16/2009)  Problem # 4:  DIABETES MELLITUS, TYPE II (ICD-250.00)  The following medications were removed from the medication list:    [employer] Ec Aspirin 325 Mg Tbec (Aspirin) ..... By mouth once daily Her updated medication list for this problem includes:    Lisinopril 10 Mg Tabs (Lisinopril) .Marland Kitchen... 1 by mouth once daily    Glucophage 1000 Mg Tabs (Metformin hcl) .Marland Kitchen... Take 1 tab  two times a day* labs are due now_call for an appt*  Labs Reviewed: Creat: 0.7 (09/06/2009)     Last Eye Exam: normal (03/04/2008) Reviewed HgBA1c results: 7.2 (10/08/2009)  6.9 (08/07/2008)  Orders: Venipuncture (40102) TLB-Lipid Panel (80061-LIPID) TLB-BMP (Basic Metabolic Panel-BMET) (80048-METABOL) TLB-CBC Platelet - w/Differential (85025-CBCD) TLB-IBC Pnl (Iron/FE;Transferrin) (83550-IBC) TLB-Hepatic/Liver Function Pnl (80076-HEPATIC) TLB-TSH (Thyroid Stimulating Hormone) (  84443-TSH) TLB-B12 + Folate Pnl (04540_98119-J47/WGN) TLB-Ferritin (82728-FER) TLB-A1C / Hgb A1C (Glycohemoglobin) (83036-A1C) TLB-Microalbumin/Creat Ratio, Urine (82043-MALB) Specimen Handling (56213)  Problem # 5:  HYPERLIPIDEMIA (ICD-272.4)  The following medications were removed from the medication list:    Fenofibrate Micronized 134 Mg Caps (Fenofibrate micronized) .Marland Kitchen... Take 1 tab once daily  Orders: Venipuncture (08657) TLB-Lipid Panel (80061-LIPID) TLB-BMP (Basic Metabolic Panel-BMET) (80048-METABOL) TLB-CBC Platelet - w/Differential (85025-CBCD) TLB-IBC Pnl (Iron/FE;Transferrin) (83550-IBC) TLB-Hepatic/Liver Function Pnl (80076-HEPATIC) TLB-TSH (Thyroid Stimulating Hormone) (84443-TSH) TLB-B12 + Folate Pnl (84696_29528-U13/KGM) TLB-Ferritin (82728-FER) TLB-A1C / Hgb A1C (Glycohemoglobin) (83036-A1C) TLB-Microalbumin/Creat Ratio, Urine (82043-MALB) Specimen Handling (01027)  Labs Reviewed: SGOT: 24 (09/06/2009)   SGPT: 28 (09/06/2009)    HDL:49.30 (03/16/2009), 42.60 (08/07/2008)  LDL:93 (03/16/2009), 88 (08/07/2008)  Chol:175 (03/16/2009), 154 (08/07/2008)  Trig:164.0 (03/16/2009), 119.0 (08/07/2008)  The following medications were removed from the medication list:    Fenofibrate Micronized 134 Mg Caps (Fenofibrate micronized) .Marland Kitchen... Take 1 tab once daily  Complete Medication List: 1)  Lisinopril 10 Mg Tabs (Lisinopril) .Marland Kitchen.. 1 by mouth once daily 2)  Freestyle Lancets Misc (Lancets) .... Use as directed 3)  Freestyle Lite Strp (Glucose blood) .... Use as directed 4)  Flonase 50 Mcg/act Susp (Fluticasone propionate) .... 2 sprays each nostril once daily 5)  Glucophage 1000 Mg Tabs (Metformin hcl) .... Take 1 tab  two times a day* labs are due now_call for an appt* 6)  Fish Oil 1000 Mg Caps (Omega-3 fatty acids) .... One capsule by mouth once daily 7)  Alprazolam 0.25 Mg Tabs (Alprazolam) .... 1/2 -1 by mouth three times a day as needed 8)  Bd Assure Bpm/auto Arm Cuff Misc (Blood pressure monitoring) .... Monitor bp as needed  Patient Instructions: 1)  Please schedule a follow-up appointment in 3 months .  Prescriptions: BD ASSURE BPM/AUTO ARM CUFF  MISC (BLOOD PRESSURE MONITORING) monitor bp as needed  #1 x 0   Entered and Authorized by:   Loreen Freud DO   Signed by:   Loreen Freud DO on 03/28/2010   Method used:   Faxed to ...       [facility]       [address]       [place], [place]  [address]       Ph: [phone]       Fax: [phone]   RxID:   [id number] LISINOPRIL 10 MG TABS (LISINOPRIL) 1 by mouth once daily  #90 x 3   Entered and Authorized by:   Loreen Freud DO   Signed by:   Loreen Freud DO on 03/28/2010   Method used:   Electronically to        [facility] (retail)       [address]       [place], [place]  [address]       Ph: [phone]       Fax: [phone]   RxID:   [id number]    Orders Added: 1)   Venipuncture [62694] 2)  TLB-Lipid Panel [80061-LIPID] 3)  TLB-BMP (Basic Metabolic Panel-BMET) [80048-METABOL] 4)  TLB-CBC Platelet - w/Differential [85025-CBCD] 5)  TLB-IBC Pnl (Iron/FE;Transferrin) [83550-IBC] 6)  TLB-Hepatic/Liver Function Pnl [80076-HEPATIC] 7)  TLB-TSH (Thyroid Stimulating Hormone) [84443-TSH] 8)  TLB-B12 + Folate Pnl [82746_82607-B12/FOL] 9)  TLB-Ferritin [82728-FER] 10)  TLB-A1C / Hgb A1C (Glycohemoglobin) [83036-A1C] 11)  TLB-Microalbumin/Creat Ratio, Urine [82043-MALB] 12)  Specimen Handling [  99000] 13)  Est. Patient Level IV [04540]

## 2010-04-21 LAB — GLUCOSE, CAPILLARY
Glucose-Capillary: 113 mg/dL — ABNORMAL HIGH (ref 70–99)
Glucose-Capillary: 119 mg/dL — ABNORMAL HIGH (ref 70–99)
Glucose-Capillary: 121 mg/dL — ABNORMAL HIGH (ref 70–99)
Glucose-Capillary: 137 mg/dL — ABNORMAL HIGH (ref 70–99)
Glucose-Capillary: 138 mg/dL — ABNORMAL HIGH (ref 70–99)
Glucose-Capillary: 154 mg/dL — ABNORMAL HIGH (ref 70–99)
Glucose-Capillary: 96 mg/dL (ref 70–99)

## 2010-04-21 LAB — DIFFERENTIAL
Eosinophils Relative: 0 % (ref 0–5)
Lymphocytes Relative: 10 % — ABNORMAL LOW (ref 12–46)
Lymphs Abs: 1.9 10*3/uL (ref 0.7–4.0)
Monocytes Absolute: 0.9 10*3/uL (ref 0.1–1.0)
Monocytes Relative: 5 % (ref 3–12)

## 2010-04-21 LAB — COMPREHENSIVE METABOLIC PANEL
ALT: 27 U/L (ref 0–35)
ALT: 32 U/L (ref 0–35)
AST: 31 U/L (ref 0–37)
AST: 34 U/L (ref 0–37)
Albumin: 3.5 g/dL (ref 3.5–5.2)
Alkaline Phosphatase: 54 U/L (ref 39–117)
Alkaline Phosphatase: 64 U/L (ref 39–117)
CO2: 24 mEq/L (ref 19–32)
Chloride: 105 mEq/L (ref 96–112)
Chloride: 108 mEq/L (ref 96–112)
Creatinine, Ser: 0.84 mg/dL (ref 0.4–1.2)
GFR calc Af Amer: >60 mL/min (ref >60)
GFR calc Af Amer: >60 mL/min (ref >60)
GFR calc non Af Amer: >60 mL/min (ref >60)
Potassium: 3.4 mEq/L — ABNORMAL LOW (ref 3.5–5.1)
Sodium: 139 mEq/L (ref 135–145)
Total Bilirubin: 1.1 mg/dL (ref 0.3–1.2)
Total Bilirubin: 1.2 mg/dL (ref 0.3–1.2)

## 2010-04-21 LAB — CLOSTRIDIUM DIFFICILE EIA
C difficile Toxins A+B, EIA: NEGATIVE
C difficile Toxins A+B, EIA: NEGATIVE

## 2010-04-21 LAB — CBC
HCT: 32.9 % — ABNORMAL LOW (ref 36.0–46.0)
HCT: 42 % (ref 36.0–46.0)
Hemoglobin: 10.5 g/dL — ABNORMAL LOW (ref 12.0–15.0)
Hemoglobin: 11.3 g/dL — ABNORMAL LOW (ref 12.0–15.0)
MCHC: 32.1 g/dL (ref 30.0–36.0)
MCV: 70.7 fL — ABNORMAL LOW (ref 78.0–100.0)
Platelets: 424 10*3/uL — ABNORMAL HIGH (ref 150–400)
RBC: 4.62 MIL/uL (ref 3.87–5.11)
RBC: 5 MIL/uL (ref 3.87–5.11)
RBC: 5.4 MIL/uL — ABNORMAL HIGH (ref 3.87–5.11)
RDW: 15 % (ref 11.5–15.5)
WBC: 13.8 10*3/uL — ABNORMAL HIGH (ref 4.0–10.5)
WBC: 18.7 10*3/uL — ABNORMAL HIGH (ref 4.0–10.5)
WBC: 19.4 10*3/uL — ABNORMAL HIGH (ref 4.0–10.5)

## 2010-04-21 LAB — IRON AND TIBC
Iron: 43 ug/dL (ref 42–135)
TIBC: 210 ug/dL — ABNORMAL LOW (ref 250–470)
UIBC: 167 ug/dL

## 2010-04-21 LAB — URINE MICROSCOPIC-ADD ON

## 2010-04-21 LAB — FERRITIN: Ferritin: 31 ng/mL (ref 10–291)

## 2010-04-21 LAB — FECAL LACTOFERRIN, QUANT: Fecal Lactoferrin: POSITIVE

## 2010-04-21 LAB — URINALYSIS, ROUTINE W REFLEX MICROSCOPIC
Bilirubin Urine: NEGATIVE
Specific Gravity, Urine: 1.03 (ref 1.005–1.030)
Urobilinogen, UA: 0.2 mg/dL (ref 0.0–1.0)

## 2010-04-21 LAB — GIARDIA/CRYPTOSPORIDIUM SCREEN(EIA)

## 2010-04-21 LAB — STOOL CULTURE

## 2010-05-19 ENCOUNTER — Ambulatory Visit: Payer: 59 | Admitting: Family Medicine

## 2010-05-24 ENCOUNTER — Encounter: Payer: Self-pay | Admitting: Family Medicine

## 2010-05-24 ENCOUNTER — Ambulatory Visit (INDEPENDENT_AMBULATORY_CARE_PROVIDER_SITE_OTHER): Payer: 59 | Admitting: Family Medicine

## 2010-05-24 VITALS — BP 120/74 | HR 71 | Temp 99.1°F | Wt 193.6 lb

## 2010-05-24 DIAGNOSIS — J329 Chronic sinusitis, unspecified: Secondary | ICD-10-CM

## 2010-05-24 MED ORDER — GUAIFENESIN-CODEINE 100-10 MG/5ML PO SYRP: ORAL_SOLUTION | ORAL | Status: DC

## 2010-05-24 MED ORDER — CEFUROXIME AXETIL 500 MG PO TABS: 500.0000 mg | ORAL_TABLET | Freq: Two times a day (BID) | ORAL | Status: AC

## 2010-05-24 MED ORDER — FLUTICASONE PROPIONATE 50 MCG/ACT NA SUSP: 2.0000 | Freq: Every day | NASAL | Status: DC

## 2010-05-24 NOTE — Progress Notes (Signed)
  Subjective:     Megan [name] is a 55 y.o. female who presents for evaluation of sinus pain. Symptoms include: congestion, cough, facial pain, fevers, post nasal drip and sinus pressure. Onset of symptoms was 7 days ago. Symptoms have been gradually worsening since that time. Past history is significant for no history of pneumonia or bronchitis. Patient is a non-smoker.  The following portions of the patient's history were reviewed and updated as appropriate: allergies, current medications, past family history, past medical history, past social history, past surgical history and problem list.  Review of Systems Pertinent items are noted in HPI.   Objective:    BP 120/74  Pulse 71  Temp(Src) 99.1 F (37.3 C) (Oral)  Wt 193 lb 9.6 oz (87.816 kg)  SpO2 97% General appearance: alert, cooperative, appears stated age and mild distress Ears: normal TM's and external ear canals both ears Nose: Nares normal. Septum midline. Mucosa normal. No drainage or sinus tenderness., sinus tenderness bilateral Throat: lips, mucosa, and tongue normal; teeth and gums normal Neck: mild anterior cervical adenopathy, supple, symmetrical, trachea midline and thyroid not enlarged, symmetric, no tenderness/mass/nodules Lungs: clear to auscultation bilaterally Heart: regular rate and rhythm, S1, S2 normal, no murmur, click, rub or gallop Extremities: extremities normal, atraumatic, no cyanosis or edema    Assessment:    Acute bacterial sinusitis.    Plan:    Neti pot recommended. Instructions given. Nasal steroids per medication orders. Antihistamines per medication orders. Ceftin per medication orders. f/u prn   Subjective:     Megan [name] is a 55 y.o. female who presents for evaluation of sinus pain. Symptoms include: congestion, sinus pressure . Onset of symptoms was 7 days ago. Symptoms have been gradually worsening since that time. Past history is significant for no asthma, + allergies .  Patient is not a smoker.   Review of Systems As above  Objective:    gen--AAOXx3 Heent---ears--tmi                Nose--+ b/l sinus tenderness and pressure               throal---+ errythema                Neck--+ cervical adenopathy Cor  +S1S2 no murmur Lungs --CTAB/L   No rrw Ext--no edema   Assessment:    Acute sinusitis   Plan:    augmentin 875 bid for 10 days   veramyst   Tylenol/ IB prn    rto prn

## 2010-05-24 NOTE — Patient Instructions (Signed)

## 2010-06-03 NOTE — Op Note (Signed)
   NAMEBLEU, MOISAN                      ACCOUNT NO.:  [id number]   MEDICAL RECORD NO.:  [id number]                   PATIENT TYPE:  OIB   LOCATION:  2550                                 FACILITY:  [facility]   PHYSICIAN:  Luisa Hart L. Lurene Shadow, M.D.             DATE OF BIRTH:  1955/01/17   DATE OF PROCEDURE:  09/18/2001  DATE OF DISCHARGE:                                 OPERATIVE REPORT   PREOPERATIVE DIAGNOSIS:  Recurrent ventral hernia.   POSTOPERATIVE DIAGNOSIS:  Recurrent ventral hernia.   PROCEDURE:  Repair of recurrent ventral hernia with mesh.   SURGEON:  Mardene Celeste. Lurene Shadow, M.D.   ASSISTANT:  Nurse.   ANESTHESIA:  General.   CLINICAL NOTE:  The patient is a 55 year old woman who has had two previous  ventral hernia repairs in a region adjacent to the umbilicus.  She returns  now with a new recurrence, which extends somewhat laterally onto the left  side.  The risks and potential benefits of surgery have been fully discussed  with the patient.  She gives consent and comes now to surgery.   DESCRIPTION OF PROCEDURE:  Following the induction of satisfactory general  anesthesia with the patient positioned supinely, the abdomen was routinely  prepped and draped to be included in the sterile operative field.  A midline  incision through the old scar cicatrix, skirting to the umbilicus to the  left.  This carried down through the skin and subcutaneous tissue and  dissection carried down to the region of the hernia.  The hernia is  dissected free from surrounding tissues and carried down to the fascia.  The  fascia is scored open and the incarcerated omentum within the hernia sac is  then freed and a portion of it is amputated and sent with the sac, and the  remainder is clamped, suture ligated, and returned to the peritoneal cavity.  The abdominal wall defect is then closed with a mesh plug, and this is then  sewn into the fascia with interrupted 0 Novofil sutures.  At the  end of the  dissection, the repair is noted to be intact.  Sponge, instrument, and sharp  counts were doubly verified.  The subcutaneous tissue is closed with a  running 2-0 Vicryl suture, skin closed with a running 4-0 Monocryl suture  and then reinforced with Steri-Strips, sterile dressings applied.  Anesthetic reversed.  The patient removed from the operating room to the  recovery room in stable condition.  She tolerated the procedure well.                                               Mardene Celeste Lurene Shadow, M.D.    PLB/MEDQ  D:  09/18/2001  T:  09/18/2001  Job:  [id number]

## 2010-06-09 ENCOUNTER — Ambulatory Visit (INDEPENDENT_AMBULATORY_CARE_PROVIDER_SITE_OTHER): Payer: 59 | Admitting: Family Medicine

## 2010-06-09 DIAGNOSIS — T783XXA Angioneurotic edema, initial encounter: Secondary | ICD-10-CM

## 2010-06-09 DIAGNOSIS — I10 Essential (primary) hypertension: Secondary | ICD-10-CM

## 2010-06-09 MED ORDER — AMLODIPINE BESYLATE 5 MG PO TABS: 5.0000 mg | ORAL_TABLET | Freq: Every day | ORAL | Status: DC

## 2010-06-09 MED ORDER — METHYLPREDNISOLONE ACETATE 80 MG/ML IJ SUSP
80.0000 mg | Freq: Once | INTRAMUSCULAR | Status: AC
  Administered 2010-06-09: 80 mg via INTRAMUSCULAR

## 2010-06-09 NOTE — Progress Notes (Signed)
Addended by: Doristine Devoid on: 06/09/2010 03:49 PM   Modules accepted: Orders

## 2010-06-09 NOTE — Progress Notes (Signed)
  Subjective:    Patient ID: Megan [name], female    DOB: February 05, 1955, 55 y.o.   MRN: [id number]  HPI Pt here c/o swelling upper lip after scratching it accidentally yesterday while eating a tuna sandwich. No cp, no sob, no difficulty swallowing.    Review of Systems    as above Objective:   Physical Exam  Constitutional: She is oriented to person, place, and time. She appears well-developed and well-nourished.  HENT:  Mouth/Throat: Oropharynx is clear and moist. No oropharyngeal exudate.  Neck: Normal range of motion. Neck supple.  Cardiovascular: Normal rate and regular rhythm.   Pulmonary/Chest: Effort normal and breath sounds normal.  Musculoskeletal: Normal range of motion.  Neurological: She is alert and oriented to person, place, and time.  Psychiatric: She has a normal mood and affect. Her behavior is normal.      Upper lip --swollen     Assessment & Plan:

## 2010-06-09 NOTE — Assessment & Plan Note (Signed)
Change to norvasc 5 mg qd rto 2 weeks or sooner prn

## 2010-06-09 NOTE — Patient Instructions (Addendum)
Sliding scale insulin----check blood sugars 4x a day---fasting and 2h after each meal for the next 2 weeks 200-250  2 u 251-300  4 u 301-350  6 u 351-400 8 u > 400  10 u  And call Dr on callAngioedema Angioedema (AE) is a sudden swelling of the eyelids, lips, lobes of ears, external genitalia, skin and other parts of the body. AE can happen by itself. It usually begins during the night and is found on awakening. It can happen along with hives and other allergic reactions. Attacks can be mild and annoying or life threatening (if there is swelling in the air passages). Angioedema generally occurs in a short time period (over minutes to hours) and gets better in 24 to 48 hours. It usually does not cause any serious problems.  There are two different kinds of AE: Allergic - usually has hives. Non Allergic - which includes:  Overreaction or direct stimulation of cells that are a part of the immune system (mast cells). Usually has hives.   Problems due to release of chemicals made by the body that cause swelling and inflammation (kinins). Usually no hives. This includes:   Hereditary (inherited from parents) AE.   Acquired (does not run in the family) AE. This has two types:  1. One that shows up before or along with certain diseases. 2. Due to the body's immune system attacking parts of the body's own cells (autoimmune). CAUSES Allergic  AE due to allergic reactions are caused by a trigger (something that causes the body to react). Common triggers include:   Foods.   Medicines.   Latex.   Direct contact with certain fruits, vegetables and animal saliva.   Insect stings.  Non Allergic  Mast cell stimulation may be caused by:   Medicines.   Dyes used in x-rays.   Autoimmune disease (where the body's own immune system reacts to parts of the body).   Possibly some virus infections.   AE due to problems with kinins can be due to:   Hereditary (inherited from parents) or acquired  (not inherited, but developed by itself). Attacks are triggered by:  -Mild injury. -Dental work or any surgery. -Stress. -Sudden changes in temperature. -Exercise. -Medicines.  Certain medicines - especially blood pressure medicines for example angiotensin-converting enzyme (ACE) inhibitors such as Lisinopril. African-American persons are at nearly five times greater risk to develop AE than Caucasians from ACE inhibitors.  SYMPTOMS Allergic:  Non-itchy swelling of the skin. Often the swelling is on the face and lips but any area of the skin can swell. Sometimes the swelling can be painful. If hives are present, there is intense itching.  Non Allergic:  If internal organs are involved (common in AE due to problems with kinins) there may be:   Feeling sick to one's stomach (nausea).   Belly pain.   Vomiting.   Difficulty swallowing.   Difficulty passing urine.   Breathing problems (if the air passages swell - can happen in allergic AE also).  Depending on the cause of AE, episodes may:  Only happen once (if triggers are removed or avoided).   Come back in unpredictable patterns.   Repeat for several years and then gradually fade away.  DIAGNOSIS Rapid onset of (it starts in minutes to hours):   Swelling of lips.   Swelling of eyelids.   Swelling of tongue.   Flushing (turning red)   Feeling faint  Angioedema is diagnosed by physical exam. Testing may be done to figure  out the causes:   Allergy skin tests may be done to see if the problem is allergic.   Blood tests to diagnose hereditary and some acquired types of AE. Family history can help diagnose hereditary AE.   Other tests may be done to see if there is a hidden disease leading to the AE  TREATMENT Treatment depends on the type and cause (if any) of the AE. Allergic  Allergic types of AE are treated with:   Immediate removal of the trigger or medicine (if any).   Epinephrine injection.   Steroids.     Antihistamines.   Hospitalization for severe attacks.  Non Allergic  Mast cell stimulation types of AE are treated with:   Immediate removal of the trigger or medicine (if any).   Epinephrine injection.   Steroids.   Antihistamines.   Hospitalization for severe attacks.   Hereditary AE is treated with:   Medicines to prevent and treat attacks.   Preventive medicines before dental work or surgery.   Removing or avoiding medicines that trigger attacks.   Hospitalization for severe attacks.   There is little response to antihistamines, epinephrine or steroids.   Acquired AE is treated with:   Treating underlying disease (if any)   Medicines to prevent and treat attacks  HOME CARE INSTRUCTIONS  Always carry your emergency allergy treatment medicines with you.   Wear a medical bracelet.   Avoid known triggers.  SEEK MEDICAL CARE IF:  You get repeat attacks.   Attacks are more frequent or more severe despite preventive measures.   If you have hereditary AE and are considering having children. It is important to discuss the risks of passing this on to your children.  SEEK IMMEDIATE MEDICAL CARE IF:  You have difficulty breathing.   You have difficulty swallowing.   Fainting.  This condition should be treated immediately. It can be a threat to life if it involves the throat and causes swelling and makes it difficult to breathe. Document Released: 03/13/2001 Document Re-Released: 03/31/2008 [facility] Patient Information 2011 [place], [place].

## 2010-06-09 NOTE — Assessment & Plan Note (Signed)
Stop lisinopril Start norvasc 

## 2010-08-22 ENCOUNTER — Other Ambulatory Visit: Payer: Self-pay | Admitting: Family Medicine

## 2010-08-23 ENCOUNTER — Other Ambulatory Visit: Payer: Self-pay | Admitting: Family Medicine

## 2010-08-23 MED ORDER — SAXAGLIPTIN-METFORMIN ER 5-1000 MG PO TB24: 1.0000 | ORAL_TABLET | Freq: Every day | ORAL | Status: DC

## 2010-08-23 NOTE — Telephone Encounter (Signed)
Labs are due    KP 

## 2010-09-20 ENCOUNTER — Other Ambulatory Visit: Payer: Self-pay | Admitting: Family Medicine

## 2010-09-20 ENCOUNTER — Ambulatory Visit (INDEPENDENT_AMBULATORY_CARE_PROVIDER_SITE_OTHER): Payer: 59 | Admitting: Family Medicine

## 2010-09-20 ENCOUNTER — Encounter: Payer: Self-pay | Admitting: Family Medicine

## 2010-09-20 VITALS — BP 134/76 | HR 87 | Temp 99.1°F | Wt 194.4 lb

## 2010-09-20 DIAGNOSIS — S90569A Insect bite (nonvenomous), unspecified ankle, initial encounter: Secondary | ICD-10-CM

## 2010-09-20 DIAGNOSIS — T148XXA Other injury of unspecified body region, initial encounter: Secondary | ICD-10-CM

## 2010-09-20 DIAGNOSIS — IMO0001 Reserved for inherently not codable concepts without codable children: Secondary | ICD-10-CM

## 2010-09-20 DIAGNOSIS — T148 Other injury of unspecified body region: Secondary | ICD-10-CM

## 2010-09-20 DIAGNOSIS — W57XXXA Bitten or stung by nonvenomous insect and other nonvenomous arthropods, initial encounter: Secondary | ICD-10-CM

## 2010-09-20 MED ORDER — METHYLPREDNISOLONE ACETATE 80 MG/ML IJ SUSP
80.0000 mg | Freq: Once | INTRAMUSCULAR | Status: AC
  Administered 2010-09-20: 80 mg via INTRAMUSCULAR

## 2010-09-20 MED ORDER — PREDNISONE 10 MG PO TABS: 10.0000 mg | ORAL_TABLET | Freq: Every day | ORAL | Status: AC

## 2010-09-20 NOTE — Progress Notes (Signed)
  Subjective:     Megan [name] is a 55 y.o. female who presents for evaluation of a rash involving the forearm and leg. Rash started 5 days ago. Lesions are pink, and raised in texture. Rash has changed over time. Rash is pruritic. Associated symptoms: none. Patient denies: abdominal pain, arthralgia, congestion, cough, crankiness, decrease in appetite, decrease in energy level, fever, headache, irritability, myalgia, nausea, sore throat and vomiting. Patient has not had contacts with similar rash. Patient has had new exposures (soaps, lotions, laundry detergents, foods, medications, plants, insects or animals).  The following portions of the patient's history were reviewed and updated as appropriate: allergies, current medications, past family history, past medical history, past social history, past surgical history and problem list.  Review of Systems Pertinent items are noted in HPI.    Objective:    BP 134/76  Pulse 87  Temp(Src) 99.1 F (37.3 C) (Oral)  Wt 194 lb 6.4 oz (88.179 kg)  SpO2 97% General:  alert, cooperative, appears stated age and no distress  Skin:  papules noted on extremities     Assessment:    bites, insect    Plan:    Medications: steroids: pred taper and depo medrol. Written patient instruction given. antihistamine otc for itching

## 2010-09-20 NOTE — Patient Instructions (Signed)
Bug Bites Mosquitoes, flies, fleas, bedbugs, black flies, sand flies, spiders, ticks and many other insects can bite. Insect bites are different from insect stings. A sting is when venom is injected into the skin. Most of the time bug bites redden, swell up and itch for 2 to 4 days, and then go away. First aid for most bug bites includes washing the area thoroughly with soap and water. You may use ice or a cold pack to reduce the swelling. You can also apply a small amount of meat tenderizer or baking soda paste to the bite area for a short period. Keep the area clean and do not scratch when it itches. You can reduce the itching and swelling by applying a cortisone cream or calamine to the bite area 4 times a day. Antihistamine and cortisone medicines may be used to relieve symptoms. Some insect bites can transmit infectious diseases, for example: ticks (Lyme disease) and mosquitoes (West Nile virus). Bug bites can get infected or cause you to become ill. If the area near the bite becomes more swollen, red, or painful over the next 2 days, you may need to start antibiotics. YOU MIGHT NEED A TETANUS SHOT NOW IF:  You have no idea when you had the last one.   You have never had a tetanus shot before.   Your bite has dirt in it.   If you need a tetanus shot, and you decide not to get one, there is a rare chance of getting tetanus. Sickness from tetanus can be serious.  If you got a tetanus shot, your arm may swell, get red and warm to the touch at the shot site. This is common and not a problem. SEEK IMMEDIATE MEDICAL CARE IF:  There is increased pain, redness or swelling in the bite area or a red streak develops on the skin near the bite area.   You or your child has an oral temperature above 102 F (38.9 C), not controlled by medicine.   Your baby is older than 3 months with a rectal temperature of 102 F (38.9 C) or higher.   Your baby is 3 months old or younger with a rectal temperature of  100.4 F (38 C) or higher.   You or your child develops new joint pain.   You or your child develops a headache or neck pain.   You or your child develops unusual weakness or loss of strength.   You or your child develops a rash.  Document Released: 02/10/2004 Document Re-Released: 03/29/2009 [employer] Patient Information 2011 [employer]. 

## 2010-11-04 ENCOUNTER — Other Ambulatory Visit: Payer: Self-pay | Admitting: *Deleted

## 2010-11-04 MED ORDER — GLUCOSE BLOOD VI STRP: ORAL_STRIP | Status: DC

## 2010-11-04 MED ORDER — FREESTYLE LANCETS MISC: Status: DC

## 2010-11-04 NOTE — Telephone Encounter (Signed)
Pt aware Rx sent to pharmacy 

## 2010-11-09 ENCOUNTER — Encounter: Payer: Self-pay | Admitting: Family Medicine

## 2010-11-10 ENCOUNTER — Encounter: Payer: Self-pay | Admitting: Family Medicine

## 2010-11-10 ENCOUNTER — Ambulatory Visit (INDEPENDENT_AMBULATORY_CARE_PROVIDER_SITE_OTHER): Payer: 59 | Admitting: Family Medicine

## 2010-11-10 DIAGNOSIS — E119 Type 2 diabetes mellitus without complications: Secondary | ICD-10-CM

## 2010-11-10 DIAGNOSIS — G514 Facial myokymia: Secondary | ICD-10-CM

## 2010-11-10 DIAGNOSIS — I10 Essential (primary) hypertension: Secondary | ICD-10-CM

## 2010-11-10 DIAGNOSIS — G518 Other disorders of facial nerve: Secondary | ICD-10-CM

## 2010-11-10 DIAGNOSIS — R319 Hematuria, unspecified: Secondary | ICD-10-CM

## 2010-11-10 LAB — BASIC METABOLIC PANEL
BUN: 10 mg/dL (ref 6–23)
Chloride: 107 mEq/L (ref 96–112)
Creatinine, Ser: 0.8 mg/dL (ref 0.4–1.2)
GFR: 94.86 mL/min (ref >60.00)

## 2010-11-10 LAB — CBC WITH DIFFERENTIAL/PLATELET
Basophils Relative: 0.3 % (ref 0.0–3.0)
Eosinophils Absolute: 0.1 10*3/uL (ref 0.0–0.7)
Eosinophils Relative: 1.2 % (ref 0.0–5.0)
HCT: 37.2 % (ref 36.0–46.0)
Hemoglobin: 11.8 g/dL — ABNORMAL LOW (ref 12.0–15.0)
Lymphs Abs: 2.6 10*3/uL (ref 0.7–4.0)
MCV: 70.1 fl — ABNORMAL LOW (ref 78.0–100.0)
Monocytes Absolute: 0.7 10*3/uL (ref 0.1–1.0)
Monocytes Relative: 7.4 % (ref 3.0–12.0)
Platelets: 355 10*3/uL (ref 150.0–400.0)
RBC: 5.31 Mil/uL — ABNORMAL HIGH (ref 3.87–5.11)
WBC: 9.4 10*3/uL (ref 4.5–10.5)

## 2010-11-10 LAB — HEPATIC FUNCTION PANEL
ALT: 23 U/L (ref 0–35)
Albumin: 3.9 g/dL (ref 3.5–5.2)
Bilirubin, Direct: 0.1 mg/dL (ref 0.0–0.3)
Total Protein: 7.9 g/dL (ref 6.0–8.3)

## 2010-11-10 LAB — POCT URINALYSIS DIPSTICK
Bilirubin, UA: NEGATIVE
Nitrite, UA: NEGATIVE
Protein, UA: NEGATIVE
Urobilinogen, UA: 0.2
pH, UA: 6

## 2010-11-10 LAB — LIPID PANEL
Cholesterol: 178 mg/dL (ref 0–200)
HDL: 53.2 mg/dL (ref >39.00)
Total CHOL/HDL Ratio: 3
Triglycerides: 162 mg/dL — ABNORMAL HIGH (ref 0.0–149.0)

## 2010-11-10 LAB — MICROALBUMIN / CREATININE URINE RATIO: Microalb Creat Ratio: 1 mg/g (ref 0.0–30.0)

## 2010-11-10 LAB — TSH: TSH: 1.37 u[IU]/mL (ref 0.35–5.50)

## 2010-11-10 LAB — VITAMIN B12: Vitamin B-12: 784 pg/mL (ref 211–911)

## 2010-11-10 NOTE — Progress Notes (Signed)
  Subjective:     Megan [name] is a 55 y.o. female who presents for follow up of diabetes.. Current symptoms include: none. Patient denies foot ulcerations, hyperglycemia, hypoglycemia , increased appetite, nausea, paresthesia of the feet, polydipsia, polyuria, visual disturbances, vomiting and weight loss. Evaluation to date has been: fasting blood sugar, fasting lipid panel, hemoglobin A1C and microalbuminuria. Home sugars: BGs consistently in an acceptable range. Current treatments: more intensive attention to diet which has been effective, weight loss of 5 lbs which has been effective and Continued kombiglyza which has been effective. .  The following portions of the patient's history were reviewed and updated as appropriate: allergies, current medications, past family history, past medical history, past social history, past surgical history and problem list.  Review of Systems Pertinent items are noted in HPI.    Objective:    BP 118/84  Pulse 78  Temp(Src) 98.6 F (37 C) (Oral)  Ht 5\' 6"  (1.676 m)  Wt 189 lb (85.73 kg)  BMI 30.51 kg/m2  SpO2 99% General appearance: alert, cooperative, appears stated age and no distress Lungs: clear to auscultation bilaterally Heart: regular rate and rhythm, S1, S2 normal, no murmur, click, rub or gallop Extremities: extremities normal, atraumatic, no cyanosis or edema  Sensory exam of the foot is normal, tested with the monofilament. Good pulses, no lesions or ulcers, good peripheral pulses. Patient was not evaluated for proper footwear and sizing.  Laboratory: No components found with this basename: A1C      Assessment:    Diabetes mellitus Type II, under good control.   Htn---con't meds  Plan:    Educational material distributed. Addressed ADA diet. Suggested low cholesterol diet. Encouraged aerobic exercise. Discussed foot care. Reminded to get yearly retinal exam. Labs: fasting blood sugar, fasting lipid panel, hemoglobin  A1C and microalbuminuria. Reminded to bring in blood sugar diary at next visit. Follow up in 3 months or as needed.

## 2010-11-10 NOTE — Patient Instructions (Signed)

## 2010-11-13 LAB — URINE CULTURE

## 2010-12-18 ENCOUNTER — Other Ambulatory Visit: Payer: Self-pay | Admitting: Family Medicine

## 2010-12-28 ENCOUNTER — Encounter (INDEPENDENT_AMBULATORY_CARE_PROVIDER_SITE_OTHER): Payer: Self-pay | Admitting: General Surgery

## 2011-01-03 ENCOUNTER — Encounter (INDEPENDENT_AMBULATORY_CARE_PROVIDER_SITE_OTHER): Payer: Self-pay | Admitting: General Surgery

## 2011-01-05 ENCOUNTER — Encounter (INDEPENDENT_AMBULATORY_CARE_PROVIDER_SITE_OTHER): Payer: Self-pay | Admitting: General Surgery

## 2011-01-05 ENCOUNTER — Ambulatory Visit (INDEPENDENT_AMBULATORY_CARE_PROVIDER_SITE_OTHER): Payer: 59 | Admitting: General Surgery

## 2011-01-05 ENCOUNTER — Encounter (HOSPITAL_COMMUNITY): Payer: Self-pay | Admitting: Pharmacy Technician

## 2011-01-05 VITALS — BP 132/76 | HR 68 | Temp 97.8°F | Resp 18 | Ht 65.0 in | Wt 196.0 lb

## 2011-01-05 DIAGNOSIS — K430 Incisional hernia with obstruction, without gangrene: Secondary | ICD-10-CM

## 2011-01-05 NOTE — Progress Notes (Signed)
Patient ID: Megan [name], female   DOB: 11/24/1955, 55 y.o.   MRN: [id number]  Chief Complaint  Patient presents with  . Other    Recheck ventral hernia    HPI Megan [name] is a 55 y.o. female.  This patient was referred by Dr. Curtis for evaluation of recurrent incisional hernia. She has had 3 prior open incisional hernias with mesh the patient report by Dr. Ballen. She has had this for some time and she states getting larger. It was noted 2 years ago on CT scan of the abdomen to evaluate for a history of colitis. She states that over this time and has been increasing in size although she is not tender and has no definite symptoms from this. She states that the bulge does not reduced even when lying flat. She has some constipation which is baseline for her but no nausea or vomiting or other obstructive symptoms. HPI  Past Medical History  Diagnosis Date  . Hypertension   . Diabetes mellitus   . Allergy   . Hyperlipidemia   . Infectious colitis   . Diverticulosis   . Colon polyp   . Hemorrhoids   . Ventral hernia     Past Surgical History  Procedure Date  . Inguinal hernia repair     x's 3  . Hemorrhoid surgery   . Ventral hernia repair   . Foot surgery     Right    Family History  Problem Relation Age of Onset  . Breast cancer Mother   . Diabetes Mother   . Cancer Mother     breast  . Throat cancer Maternal Aunt   . Heart disease Father     heart attack  . Diabetes Brother   . Diabetes    . Cancer      throat..aunt    Social History History  Substance Use Topics  . Smoking status: Former Smoker    Quit date: 01/05/1996  . Smokeless tobacco: Never Used  . Alcohol Use: Yes    Allergies  Allergen Reactions  . Aspirin Nausea Only    Current Outpatient Prescriptions  Medication Sig Dispense Refill  . Multiple Vitamin (MULTIVITAMIN) tablet Take 1 tablet by mouth daily.        . ALPRAZolam (XANAX) 0.25 MG tablet Take 0.25 mg by mouth as needed.        . amLODipine (NORVASC) 5 MG tablet Take 1 tablet (5 mg total) by mouth daily.  30 tablet  11  . fluticasone (FLONASE) 50 MCG/ACT nasal spray 2 sprays by Nasal route daily.  16 g  5  . glucose blood (FREESTYLE LITE) test strip Use as instructed  100 each  12  . KOMBIGLYZE XR 05-998 MG TB24 TAKE ONE TABLET BY MOUTH EVERY DAY  30 tablet  5  . Lancets (FREESTYLE) lancets Use as instructed  100 each  12  . vitamin B-12 (CYANOCOBALAMIN) 500 MCG tablet Take 1,000 mcg by mouth daily.          Review of Systems Review of Systems All other review of systems negative or noncontributory except as stated in the HPI Blood pressure 132/76, pulse 68, temperature 97.8 F (36.6 C), temperature source Temporal, resp. rate 18, height 5' 5" (1.651 m), weight 196 lb (88.905 kg).  Physical Exam Physical Exam  Vitals reviewed. Constitutional: She is oriented to person, place, and time. She appears well-developed and well-nourished. No distress.  HENT:  Head: Normocephalic and atraumatic.  Mouth/Throat: No   oropharyngeal exudate.  Eyes: Pupils are equal, round, and reactive to light. Right eye exhibits no discharge. Left eye exhibits no discharge. No scleral icterus.  Neck: Normal range of motion. No tracheal deviation present.  Cardiovascular: Normal rate, regular rhythm and normal heart sounds.   Pulmonary/Chest: Effort normal and breath sounds normal. No stridor. No respiratory distress. She has no wheezes.  Abdominal: Soft. Bowel sounds are normal. She exhibits mass. She exhibits no distension. There is no tenderness. There is no rebound and no guarding.       She has a 5-6cm mass to the right of her umbilicus near her previous surgical incisions consistent with incarcerated incisional henia.  I cannot feel the fascial edges and it does not reduce.  Musculoskeletal: Normal range of motion. She exhibits no edema.  Neurological: She is alert and oriented to person, place, and time.  Skin: Skin is warm and  dry. No rash noted. She is not diaphoretic. No erythema. No pallor.  Psychiatric: She has a normal mood and affect. Her behavior is normal. Judgment and thought content normal.    Data Reviewed CT from 2010  Assessment    Recurrent incarcerated incisional hernia     Plan    She has a 3 time recurrent incisional hernia with 3 prior open repairs and it appears incarcerated on exam today. She is asymptomatic but she states it has been increasing in size. I have offered repair of her hernia and since she has had 3 prior failed open repairs, I have recommended laparoscopic repair. I think we would be able to get a good evaluation of the abdominal wall and wide overlap with the mesh. I discussed with her the risks of infection, bleeding, pain, scarring, recurrence, seroma, persistent bulge, need for open repair, injury to bowel and she expressed understanding and desires to proceed with laparoscopic repair of her recurrent incisional hernia.       Megan [name] DAVID 01/05/2011, 11:23 AM    

## 2011-01-16 ENCOUNTER — Ambulatory Visit (HOSPITAL_COMMUNITY)
Admission: RE | Admit: 2011-01-16 | Discharge: 2011-01-16 | Disposition: A | Discharge status: Home / Self Care | Payer: 59 | Source: Ambulatory Visit | Attending: General Surgery | Admitting: General Surgery

## 2011-01-16 ENCOUNTER — Other Ambulatory Visit: Payer: Self-pay

## 2011-01-16 ENCOUNTER — Encounter (HOSPITAL_COMMUNITY)
Admission: RE | Admit: 2011-01-16 | Discharge: 2011-01-16 | Payer: 59 | Source: Ambulatory Visit | Attending: General Surgery | Admitting: General Surgery

## 2011-01-16 ENCOUNTER — Encounter (HOSPITAL_COMMUNITY): Payer: Self-pay

## 2011-01-16 DIAGNOSIS — Z01810 Encounter for preprocedural cardiovascular examination: Secondary | ICD-10-CM | POA: Insufficient documentation

## 2011-01-16 DIAGNOSIS — Z01818 Encounter for other preprocedural examination: Secondary | ICD-10-CM | POA: Insufficient documentation

## 2011-01-16 DIAGNOSIS — E119 Type 2 diabetes mellitus without complications: Secondary | ICD-10-CM | POA: Insufficient documentation

## 2011-01-16 DIAGNOSIS — Z87891 Personal history of nicotine dependence: Secondary | ICD-10-CM | POA: Insufficient documentation

## 2011-01-16 DIAGNOSIS — K430 Incisional hernia with obstruction, without gangrene: Secondary | ICD-10-CM

## 2011-01-16 DIAGNOSIS — Z01812 Encounter for preprocedural laboratory examination: Secondary | ICD-10-CM | POA: Insufficient documentation

## 2011-01-16 HISTORY — DX: Unspecified osteoarthritis, unspecified site: M19.90

## 2011-01-16 HISTORY — DX: Personal history of other diseases of the digestive system: Z87.19

## 2011-01-16 HISTORY — DX: Gastro-esophageal reflux disease without esophagitis: K21.9

## 2011-01-16 LAB — CBC
HCT: 36 % (ref 36.0–46.0)
Hemoglobin: 11.5 g/dL — ABNORMAL LOW (ref 12.0–15.0)
MCH: 21.4 pg — ABNORMAL LOW (ref 26.0–34.0)
MCHC: 31.9 g/dL (ref 30.0–36.0)
MCV: 67 fL — ABNORMAL LOW (ref 78.0–100.0)
Platelets: 407 K/uL — ABNORMAL HIGH (ref 150–400)
RBC: 5.37 MIL/uL — ABNORMAL HIGH (ref 3.87–5.11)
RDW: 15 % (ref 11.5–15.5)
WBC: 10.4 K/uL (ref 4.0–10.5)

## 2011-01-16 LAB — BASIC METABOLIC PANEL WITH GFR
BUN: 10 mg/dL (ref 6–23)
CO2: 27 meq/L (ref 19–32)
Calcium: 9.7 mg/dL (ref 8.4–10.5)
Chloride: 102 meq/L (ref 96–112)
Creatinine, Ser: 0.67 mg/dL (ref 0.50–1.10)
GFR calc Af Amer: >90 mL/min (ref >90)
GFR calc non Af Amer: >90 mL/min (ref >90)
Glucose, Bld: 92 mg/dL (ref 70–99)
Potassium: 3.6 meq/L (ref 3.5–5.1)
Sodium: 138 meq/L (ref 135–145)

## 2011-01-16 NOTE — Patient Instructions (Signed)
20 Megan [name]  01/16/2011   Your procedure is scheduled on:  01/20/11   Friday    [id number]  Report to [facility] at 0515 AM.  Call this number if you have problems the morning of surgery: [phone]   Or PST  [phone]   Remember:   Do not eat food:After Midnight.   Thursday night  May have clear liquids:until Midnight .Thursday night  Clear liquids include soda, tea, black coffee, apple or grape juice, broth.  Take these medicines the morning of surgery with A SIP OF WATER  Norvasc with sip water              No BLOOD SUGAR MEDS AM OF SURGERY  Do not wear jewelry, make-up or nail polish.  Do not wear lotions, powders, or perfumes. You may wear deodorant.  Do not shave 48 hours prior to surgery.  Do not bring valuables to the hospital.  Contacts, dentures or bridgework may not be worn into surgery.  Leave suitcase in the car. After surgery it may be brought to your room.  For patients admitted to the hospital, checkout time is 11:00 AM the day of discharge.   Patients discharged the day of surgery will not be allowed to drive home.  Name and phone number of your driver:friend  Sam Special Instructions: CHG Shower Use Special Wash: 1/2 bottle night before surgery and 1/2 bottle morning of surgery.   Regular soap face and privates   Please read over the following fact sheets that you were given: MRSA Information

## 2011-01-18 ENCOUNTER — Telehealth (INDEPENDENT_AMBULATORY_CARE_PROVIDER_SITE_OTHER): Payer: Self-pay

## 2011-01-18 NOTE — Telephone Encounter (Signed)
Pt called wanting to know what meds she can take the morning of surgery. Pt states she was advised by pre admit to take her BP med but was not sure about her oral diabetic med or vitamins. I advised pt she should hold her vitamins and any blood thinning meds but she would need to speak with pre admit to see if anesthesia wants her to take any diabetic meds the morning of surgery. Pt will call pre admit re this.

## 2011-01-20 ENCOUNTER — Encounter (HOSPITAL_COMMUNITY): Payer: Self-pay | Admitting: Anesthesiology

## 2011-01-20 ENCOUNTER — Encounter (HOSPITAL_COMMUNITY): Payer: Self-pay | Admitting: Surgery

## 2011-01-20 ENCOUNTER — Inpatient Hospital Stay (HOSPITAL_COMMUNITY): Payer: 59 | Admitting: Anesthesiology

## 2011-01-20 ENCOUNTER — Inpatient Hospital Stay (HOSPITAL_COMMUNITY)
Admission: RE | Admit: 2011-01-20 | Discharge: 2011-01-22 | DRG: 355 | Disposition: A | Discharge status: Home / Self Care | Payer: 59 | Source: Ambulatory Visit | Attending: General Surgery | Admitting: General Surgery

## 2011-01-20 ENCOUNTER — Encounter (HOSPITAL_COMMUNITY)
Admission: RE | Disposition: A | Discharge status: Home / Self Care | Payer: Self-pay | Source: Ambulatory Visit | Attending: General Surgery

## 2011-01-20 ENCOUNTER — Encounter (HOSPITAL_COMMUNITY): Payer: Self-pay | Admitting: *Deleted

## 2011-01-20 DIAGNOSIS — Z87891 Personal history of nicotine dependence: Secondary | ICD-10-CM

## 2011-01-20 DIAGNOSIS — M795 Residual foreign body in soft tissue: Secondary | ICD-10-CM

## 2011-01-20 DIAGNOSIS — K430 Incisional hernia with obstruction, without gangrene: Secondary | ICD-10-CM

## 2011-01-20 DIAGNOSIS — E785 Hyperlipidemia, unspecified: Secondary | ICD-10-CM | POA: Diagnosis present

## 2011-01-20 DIAGNOSIS — K432 Incisional hernia without obstruction or gangrene: Secondary | ICD-10-CM

## 2011-01-20 DIAGNOSIS — I10 Essential (primary) hypertension: Secondary | ICD-10-CM | POA: Diagnosis present

## 2011-01-20 DIAGNOSIS — E119 Type 2 diabetes mellitus without complications: Secondary | ICD-10-CM | POA: Diagnosis present

## 2011-01-20 DIAGNOSIS — Z888 Allergy status to other drugs, medicaments and biological substances status: Secondary | ICD-10-CM

## 2011-01-20 HISTORY — PX: HERNIA REPAIR: SHX51

## 2011-01-20 HISTORY — PX: INCISIONAL HERNIA REPAIR: SHX193

## 2011-01-20 SURGERY — REPAIR, HERNIA, INCISIONAL, LAPAROSCOPIC
Anesthesia: General | Site: Abdomen | Wound class: Clean

## 2011-01-20 MED ORDER — HYDROMORPHONE HCL PF 1 MG/ML IJ SOLN
INTRAMUSCULAR | Status: DC | PRN
  Administered 2011-01-20: 1 mg via INTRAVENOUS

## 2011-01-20 MED ORDER — LIDOCAINE HCL (CARDIAC) 20 MG/ML IV SOLN
INTRAVENOUS | Status: DC | PRN
  Administered 2011-01-20: 50 mg via INTRAVENOUS

## 2011-01-20 MED ORDER — METFORMIN HCL ER 500 MG PO TB24
1000.0000 mg | ORAL_TABLET | Freq: Every day | ORAL | Status: DC
  Administered 2011-01-20 – 2011-01-22 (×3): 1000 mg via ORAL
  Filled 2011-01-20 (×3): qty 2

## 2011-01-20 MED ORDER — HEPARIN SODIUM (PORCINE) 5000 UNIT/ML IJ SOLN
5000.0000 [IU] | Freq: Once | INTRAMUSCULAR | Status: AC
  Administered 2011-01-20: 5000 [IU] via SUBCUTANEOUS

## 2011-01-20 MED ORDER — MORPHINE SULFATE 2 MG/ML IJ SOLN
4.0000 mg | INTRAMUSCULAR | Status: DC | PRN
  Administered 2011-01-20 (×3): 4 mg via INTRAVENOUS
  Filled 2011-01-20 (×3): qty 2

## 2011-01-20 MED ORDER — ONDANSETRON HCL 4 MG/2ML IJ SOLN
INTRAMUSCULAR | Status: DC | PRN
  Administered 2011-01-20: 4 mg via INTRAVENOUS

## 2011-01-20 MED ORDER — SAXAGLIPTIN-METFORMIN ER 5-1000 MG PO TB24: 1.0000 | ORAL_TABLET | Freq: Every day | ORAL | Status: DC

## 2011-01-20 MED ORDER — LIDOCAINE-EPINEPHRINE (PF) 1 %-1:200000 IJ SOLN
INTRAMUSCULAR | Status: DC | PRN
  Administered 2011-01-20: 09:00:00

## 2011-01-20 MED ORDER — NEOSTIGMINE METHYLSULFATE 1 MG/ML IJ SOLN
INTRAMUSCULAR | Status: DC | PRN
  Administered 2011-01-20: 4 mg via INTRAVENOUS

## 2011-01-20 MED ORDER — MIDAZOLAM HCL 5 MG/5ML IJ SOLN
INTRAMUSCULAR | Status: DC | PRN
  Administered 2011-01-20: 2 mg via INTRAVENOUS

## 2011-01-20 MED ORDER — LACTATED RINGERS IV SOLN: INTRAVENOUS | Status: DC

## 2011-01-20 MED ORDER — LIDOCAINE-EPINEPHRINE (PF) 1 %-1:200000 IJ SOLN
INTRAMUSCULAR | Status: AC
  Filled 2011-01-20: qty 10

## 2011-01-20 MED ORDER — FENTANYL CITRATE 0.05 MG/ML IJ SOLN
25.0000 ug | INTRAMUSCULAR | Status: DC | PRN
  Administered 2011-01-20: 25 ug via INTRAVENOUS

## 2011-01-20 MED ORDER — ONDANSETRON HCL 4 MG PO TABS: 4.0000 mg | ORAL_TABLET | Freq: Four times a day (QID) | ORAL | Status: DC | PRN

## 2011-01-20 MED ORDER — 0.9 % SODIUM CHLORIDE (POUR BTL) OPTIME
TOPICAL | Status: DC | PRN
  Administered 2011-01-20: 1000 mL

## 2011-01-20 MED ORDER — POTASSIUM CHLORIDE IN NACL 20-0.9 MEQ/L-% IV SOLN
INTRAVENOUS | Status: DC
  Administered 2011-01-20: 14:00:00 via INTRAVENOUS
  Administered 2011-01-20: 100 mL via INTRAVENOUS
  Administered 2011-01-21: 10:00:00 via INTRAVENOUS
  Administered 2011-01-21: 10 mL/h via INTRAVENOUS
  Filled 2011-01-20 (×5): qty 1000

## 2011-01-20 MED ORDER — AMLODIPINE BESYLATE 5 MG PO TABS
5.0000 mg | ORAL_TABLET | Freq: Every day | ORAL | Status: DC
  Administered 2011-01-21 – 2011-01-22 (×2): 5 mg via ORAL
  Filled 2011-01-20 (×2): qty 1

## 2011-01-20 MED ORDER — GLYCOPYRROLATE 0.2 MG/ML IJ SOLN
INTRAMUSCULAR | Status: DC | PRN
  Administered 2011-01-20: .7 mg via INTRAVENOUS

## 2011-01-20 MED ORDER — FENTANYL CITRATE 0.05 MG/ML IJ SOLN
INTRAMUSCULAR | Status: DC | PRN
  Administered 2011-01-20: 50 ug via INTRAVENOUS
  Administered 2011-01-20: 100 ug via INTRAVENOUS
  Administered 2011-01-20 (×2): 50 ug via INTRAVENOUS

## 2011-01-20 MED ORDER — CEFAZOLIN SODIUM 1-5 GM-% IV SOLN
INTRAVENOUS | Status: AC
  Filled 2011-01-20: qty 50

## 2011-01-20 MED ORDER — ONDANSETRON HCL 4 MG/2ML IJ SOLN
4.0000 mg | Freq: Four times a day (QID) | INTRAMUSCULAR | Status: DC | PRN
  Administered 2011-01-20: 4 mg via INTRAVENOUS
  Filled 2011-01-20: qty 2

## 2011-01-20 MED ORDER — ACETAMINOPHEN 10 MG/ML IV SOLN
INTRAVENOUS | Status: DC | PRN
  Administered 2011-01-20: 1000 mg via INTRAVENOUS

## 2011-01-20 MED ORDER — ENOXAPARIN SODIUM 40 MG/0.4ML ~~LOC~~ SOLN
40.0000 mg | SUBCUTANEOUS | Status: DC
  Administered 2011-01-21 – 2011-01-22 (×2): 40 mg via SUBCUTANEOUS
  Filled 2011-01-20 (×2): qty 0.4

## 2011-01-20 MED ORDER — LACTATED RINGERS IV SOLN
INTRAVENOUS | Status: DC | PRN
  Administered 2011-01-20 (×2): via INTRAVENOUS

## 2011-01-20 MED ORDER — LINAGLIPTIN 5 MG PO TABS
5.0000 mg | ORAL_TABLET | Freq: Every day | ORAL | Status: DC
  Administered 2011-01-20 – 2011-01-22 (×3): 5 mg via ORAL
  Filled 2011-01-20 (×3): qty 1

## 2011-01-20 MED ORDER — ACETAMINOPHEN 10 MG/ML IV SOLN
INTRAVENOUS | Status: AC
  Filled 2011-01-20: qty 100

## 2011-01-20 MED ORDER — CEFAZOLIN SODIUM-DEXTROSE 2-3 GM-% IV SOLR
2.0000 g | INTRAVENOUS | Status: AC
  Administered 2011-01-20: 2 g via INTRAVENOUS

## 2011-01-20 MED ORDER — ROCURONIUM BROMIDE 100 MG/10ML IV SOLN
INTRAVENOUS | Status: DC | PRN
  Administered 2011-01-20: 50 mg via INTRAVENOUS

## 2011-01-20 MED ORDER — BUPIVACAINE HCL (PF) 0.25 % IJ SOLN
INTRAMUSCULAR | Status: AC
  Filled 2011-01-20: qty 30

## 2011-01-20 MED ORDER — OXYCODONE-ACETAMINOPHEN 5-325 MG PO TABS
1.0000 | ORAL_TABLET | ORAL | Status: DC | PRN
  Administered 2011-01-21: 1 via ORAL
  Administered 2011-01-21: 2 via ORAL
  Filled 2011-01-20: qty 2
  Filled 2011-01-20: qty 1

## 2011-01-20 MED ORDER — PROPOFOL 10 MG/ML IV BOLUS
INTRAVENOUS | Status: DC | PRN
  Administered 2011-01-20: 40 mg via INTRAVENOUS
  Administered 2011-01-20: 150 mg via INTRAVENOUS

## 2011-01-20 MED ORDER — FENTANYL CITRATE 0.05 MG/ML IJ SOLN
INTRAMUSCULAR | Status: AC
  Filled 2011-01-20: qty 2

## 2011-01-20 MED ORDER — FLUTICASONE PROPIONATE 50 MCG/ACT NA SUSP
2.0000 | Freq: Every day | NASAL | Status: DC | PRN
  Filled 2011-01-20: qty 16

## 2011-01-20 MED ORDER — PROMETHAZINE HCL 25 MG/ML IJ SOLN: 6.2500 mg | INTRAMUSCULAR | Status: DC | PRN

## 2011-01-20 SURGICAL SUPPLY — 45 items
ADH SKN CLS APL DERMABOND .7 (GAUZE/BANDAGES/DRESSINGS) ×1
BLADE SURG SZ11 CARB STEEL (BLADE) ×2 IMPLANT
CABLE HIGH FREQUENCY MONO STRZ (ELECTRODE) ×2 IMPLANT
CANISTER SUCTION 2500CC (MISCELLANEOUS) ×2 IMPLANT
CLOTH BEACON ORANGE TIMEOUT ST (SAFETY) ×2 IMPLANT
COVER SURGICAL LIGHT HANDLE (MISCELLANEOUS) ×1 IMPLANT
DECANTER SPIKE VIAL GLASS SM (MISCELLANEOUS) ×2 IMPLANT
DERMABOND ADVANCED (GAUZE/BANDAGES/DRESSINGS) ×1
DERMABOND ADVANCED .7 DNX12 (GAUZE/BANDAGES/DRESSINGS) ×1 IMPLANT
DEVICE SECURE STRAP 25 ABSORB (INSTRUMENTS) ×1 IMPLANT
DEVICE TROCAR PUNCTURE CLOSURE (ENDOMECHANICALS) ×1 IMPLANT
DRAPE INCISE IOBAN 66X45 STRL (DRAPES) ×2 IMPLANT
DRAPE LAPAROSCOPIC ABDOMINAL (DRAPES) ×2 IMPLANT
ELECT REM PT RETURN 9FT ADLT (ELECTROSURGICAL) ×2
ELECTRODE REM PT RTRN 9FT ADLT (ELECTROSURGICAL) ×1 IMPLANT
GLOVE BIO SURGEON STRL SZ7 (GLOVE) ×1 IMPLANT
GLOVE BIOGEL PI IND STRL 7.0 (GLOVE) ×1 IMPLANT
GLOVE BIOGEL PI INDICATOR 7.0 (GLOVE)
GLOVE SURG SS PI 7.5 STRL IVOR (GLOVE) ×4 IMPLANT
GOWN PREVENTION PLUS LG XLONG (DISPOSABLE) ×2 IMPLANT
GOWN STRL NON-REIN LRG LVL3 (GOWN DISPOSABLE) ×2 IMPLANT
GOWN STRL REIN XL XLG (GOWN DISPOSABLE) ×4 IMPLANT
KIT BASIN OR (CUSTOM PROCEDURE TRAY) ×2 IMPLANT
MESH PHYSIO OVAL 10X15CM (Mesh General) ×1 IMPLANT
NDL SPNL 22GX3.5 QUINCKE BK (NEEDLE) IMPLANT
NEEDLE SPNL 22GX3.5 QUINCKE BK (NEEDLE) ×2 IMPLANT
NS IRRIG 1000ML POUR BTL (IV SOLUTION) ×2 IMPLANT
PEN SKIN MARKING BROAD (MISCELLANEOUS) ×2 IMPLANT
SCISSORS LAP 5X35 DISP (ENDOMECHANICALS) ×1 IMPLANT
SET IRRIG TUBING LAPAROSCOPIC (IRRIGATION / IRRIGATOR) IMPLANT
SOLUTION ANTI FOG 6CC (MISCELLANEOUS) ×2 IMPLANT
STAPLER VISISTAT 35W (STAPLE) ×2 IMPLANT
STRIP CLOSURE SKIN 1/2X4 (GAUZE/BANDAGES/DRESSINGS) IMPLANT
SUT GORETEX NAB #0 THX26 36IN (SUTURE) ×1 IMPLANT
SUT MNCRL AB 4-0 PS2 18 (SUTURE) ×2 IMPLANT
SUT PROLENE 0 CT 1 CR/8 (SUTURE) ×1 IMPLANT
SUT PROLENE 2 0 SH DA (SUTURE) IMPLANT
TACKER 5MM HERNIA 3.5CML NAB (ENDOMECHANICALS) ×2 IMPLANT
TOWEL OR 17X26 10 PK STRL BLUE (TOWEL DISPOSABLE) ×2 IMPLANT
TRAY FOLEY CATH 14FRSI W/METER (CATHETERS) ×2 IMPLANT
TRAY LAP CHOLE (CUSTOM PROCEDURE TRAY) ×2 IMPLANT
TROCAR BALLN 12MMX100 BLUNT (TROCAR) IMPLANT
TROCAR BLADELESS OPT 5 75 (ENDOMECHANICALS) ×5 IMPLANT
TROCAR XCEL NON-BLD 11X100MML (ENDOMECHANICALS) ×2 IMPLANT
TUBING INSUFFLATION 10FT LAP (TUBING) ×2 IMPLANT

## 2011-01-20 NOTE — H&P (View-Only) (Signed)
Patient ID: Megan [name], female   DOB: 09-05-1955, 56 y.o.   MRN: [id number]  Chief Complaint  Patient presents with  . Other    Recheck ventral hernia    HPI Megan [name] is a 56 y.o. female.  This patient was referred by Dr. Lyda Jester for evaluation of recurrent incisional hernia. She has had 3 prior open incisional hernias with mesh the patient report by Dr. Lurene Shadow. She has had this for some time and she states getting larger. It was noted 2 years ago on CT scan of the abdomen to evaluate for a history of colitis. She states that over this time and has been increasing in size although she is not tender and has no definite symptoms from this. She states that the bulge does not reduced even when lying flat. She has some constipation which is baseline for her but no nausea or vomiting or other obstructive symptoms. HPI  Past Medical History  Diagnosis Date  . Hypertension   . Diabetes mellitus   . Allergy   . Hyperlipidemia   . Infectious colitis   . Diverticulosis   . Colon polyp   . Hemorrhoids   . Ventral hernia     Past Surgical History  Procedure Date  . Inguinal hernia repair     x's 3  . Hemorrhoid surgery   . Ventral hernia repair   . Foot surgery     Right    Family History  Problem Relation Age of Onset  . Breast cancer Mother   . Diabetes Mother   . Cancer Mother     breast  . Throat cancer Maternal Aunt   . Heart disease Father     heart attack  . Diabetes Brother   . Diabetes    . Cancer      throat.Megan [name]    Social History History  Substance Use Topics  . Smoking status: Former Smoker    Quit date: 01/05/1996  . Smokeless tobacco: Never Used  . Alcohol Use: Yes    Allergies  Allergen Reactions  . Aspirin Nausea Only    Current Outpatient Prescriptions  Medication Sig Dispense Refill  . Multiple Vitamin (MULTIVITAMIN) tablet Take 1 tablet by mouth daily.        Megan Kitchen ALPRAZolam (XANAX) 0.25 MG tablet Take 0.25 mg by mouth as needed.        Megan Kitchen amLODipine (NORVASC) 5 MG tablet Take 1 tablet (5 mg total) by mouth daily.  30 tablet  11  . fluticasone (FLONASE) 50 MCG/ACT nasal spray 2 sprays by Nasal route daily.  16 g  5  . glucose blood (FREESTYLE LITE) test strip Use as instructed  100 each  12  . KOMBIGLYZE XR 05-998 MG TB24 TAKE ONE TABLET BY MOUTH EVERY DAY  30 tablet  5  . Lancets (FREESTYLE) lancets Use as instructed  100 each  12  . vitamin B-12 (CYANOCOBALAMIN) 500 MCG tablet Take 1,000 mcg by mouth daily.          Review of Systems Review of Systems All other review of systems negative or noncontributory except as stated in the HPI Blood pressure 132/76, pulse 68, temperature 97.8 F (36.6 C), temperature source Temporal, resp. rate 18, height 5\' 5"  (1.651 m), weight 196 lb (88.905 kg).  Physical Exam Physical Exam  Vitals reviewed. Constitutional: She is oriented to person, place, and time. She appears well-developed and well-nourished. No distress.  HENT:  Head: Normocephalic and atraumatic.  Mouth/Throat: No  oropharyngeal exudate.  Eyes: Pupils are equal, round, and reactive to light. Right eye exhibits no discharge. Left eye exhibits no discharge. No scleral icterus.  Neck: Normal range of motion. No tracheal deviation present.  Cardiovascular: Normal rate, regular rhythm and normal heart sounds.   Pulmonary/Chest: Effort normal and breath sounds normal. No stridor. No respiratory distress. She has no wheezes.  Abdominal: Soft. Bowel sounds are normal. She exhibits mass. She exhibits no distension. There is no tenderness. There is no rebound and no guarding.       She has a 5-6cm mass to the right of her umbilicus near her previous surgical incisions consistent with incarcerated incisional henia.  I cannot feel the fascial edges and it does not reduce.  Musculoskeletal: Normal range of motion. She exhibits no edema.  Neurological: She is alert and oriented to person, place, and time.  Skin: Skin is warm and  dry. No rash noted. She is not diaphoretic. No erythema. No pallor.  Psychiatric: She has a normal mood and affect. Her behavior is normal. Judgment and thought content normal.    Data Reviewed CT from 2010  Assessment    Recurrent incarcerated incisional hernia     Plan    She has a 3 time recurrent incisional hernia with 3 prior open repairs and it appears incarcerated on exam today. She is asymptomatic but she states it has been increasing in size. I have offered repair of her hernia and since she has had 3 prior failed open repairs, I have recommended laparoscopic repair. I think we would be able to get a good evaluation of the abdominal wall and wide overlap with the mesh. I discussed with her the risks of infection, bleeding, pain, scarring, recurrence, seroma, persistent bulge, need for open repair, injury to bowel and she expressed understanding and desires to proceed with laparoscopic repair of her recurrent incisional hernia.       Lodema Pilot DAVID 01/05/2011, 11:23 AM

## 2011-01-20 NOTE — Progress Notes (Signed)
Dr. Rica Mast in- made aware of patient's sa02s oxygen usage- O.K. To go to floor- orders given.

## 2011-01-20 NOTE — Transfer of Care (Signed)
Immediate Anesthesia Transfer of Care Note  Patient: Megan [name]  Procedure(s) Performed:  LAPAROSCOPIC INCISIONAL HERNIA - Laparoscopic repair of a recurrent incisional hernia with Mesh  Patient Location: PACU  Anesthesia Type: General  Level of Consciousness: sedated, patient cooperative and responds to stimulaton  Airway & Oxygen Therapy: Patient Spontanous Breathing and Patient connected to face mask oxgen  Post-op Assessment: Report given to PACU RN and Post -op Vital signs reviewed and stable  Post vital signs: Reviewed and stable  Complications: No apparent anesthesia complications

## 2011-01-20 NOTE — Brief Op Note (Signed)
01/20/2011  9:50 AM  PATIENT:  Megan [name]  56 y.o. female  PRE-OPERATIVE DIAGNOSIS:  Incisional hernia  POST-OPERATIVE DIAGNOSIS:  Incisional hernia  PROCEDURE:  Procedure(s): LAPAROSCOPIC INCISIONAL HERNIA  SURGEON:  Surgeon(s): Rulon Abide, DO  PHYSICIAN ASSISTANT:   ASSISTANTS: none   ANESTHESIA:   general  EBL:  Total I/O In: 1300 [I.V.:1300] Out: 250 [Urine:200; Blood:50]  BLOOD ADMINISTERED:none  DRAINS: none   LOCAL MEDICATIONS USED:  MARCAINE 17CC and LIDOCAINE 17CC  SPECIMEN:  No Specimen  DISPOSITION OF SPECIMEN:  N/A  COUNTS:  YES  TOURNIQUET:  * No tourniquets in log *  DICTATION: .Other Dictation: Dictation Number [id number]  PLAN OF CARE: Admit to inpatient   PATIENT DISPOSITION:  PACU - hemodynamically stable.   Delay start of Pharmacological VTE agent (>24hrs) due to surgical blood loss or risk of bleeding:  {YES/NO/NOT APPLICABLE:20182

## 2011-01-20 NOTE — Interval H&P Note (Signed)
History and Physical Interval Note:  01/20/2011 7:23 AM  Megan [name]  has presented today for surgery, with the diagnosis of Incisional hernia  The various methods of treatment have been discussed with the patient and family. After consideration of risks, benefits and other options for treatment, the patient has consented to  Procedure(s): LAPAROSCOPIC INCISIONAL HERNIA as a surgical intervention .  The patients' history has been reviewed, patient examined, no change in status, stable for surgery.  I have reviewed the patients' chart and labs.  Questions were answered to the patient's satisfaction.  I again reviewed the risks of infection, bleeding, pain, scarring, recurrence, bowel injury, need for open repair, seroma, and persistent bulges.    Lodema Pilot DAVID

## 2011-01-20 NOTE — Anesthesia Postprocedure Evaluation (Signed)
Anesthesia Post Note  Patient: Megan [name]  Procedure(s) Performed:  LAPAROSCOPIC INCISIONAL HERNIA - Laparoscopic repair of a recurrent incisional hernia with Mesh  Anesthesia type: General  Patient location: PACU  Post pain: Pain level controlled  Post assessment: Post-op Vital signs reviewed  Last Vitals:  Filed Vitals:   01/20/11 1015  BP:   Pulse: 64  Temp:   Resp: 17    Post vital signs: Reviewed  Level of consciousness: sedated  Complications: No apparent anesthesia complications

## 2011-01-20 NOTE — Anesthesia Preprocedure Evaluation (Signed)
Anesthesia Evaluation  Patient identified by MRN, date of birth, ID band Patient awake    Reviewed: Allergy & Precautions, H&P , NPO status , Patient's Chart, lab work & pertinent test results  History of Anesthesia Complications Negative for: history of anesthetic complications  Airway Mallampati: III TM Distance: >3 FB Neck ROM: Full    Dental  (+) Teeth Intact and Dental Advisory Given   Pulmonary neg pulmonary ROS,  clear to auscultation  Pulmonary exam normal       Cardiovascular Exercise Tolerance: Poor hypertension, Pt. on medications neg cardio ROS Normal    Neuro/Psych Negative Neurological ROS  Negative Psych ROS   GI/Hepatic negative GI ROS, Neg liver ROS, hiatal hernia, GERD-  ,  Endo/Other  Negative Endocrine ROSDiabetes mellitus-, Well Controlled, Type 2, Oral Hypoglycemic Agents  Renal/GU negative Renal ROS  Genitourinary negative   Musculoskeletal negative musculoskeletal ROS (+)   Abdominal Normal abdominal exam  (+)   Peds negative pediatric ROS (+)  Hematology negative hematology ROS (+)   Anesthesia Other Findings   Reproductive/Obstetrics negative OB ROS                           Anesthesia Physical Anesthesia Plan  ASA: II  Anesthesia Plan: General   Post-op Pain Management:    Induction: Intravenous  Airway Management Planned:   Additional Equipment:   Intra-op Plan:   Post-operative Plan: Extubation in OR  Informed Consent: I have reviewed the patients History and Physical, chart, labs and discussed the procedure including the risks, benefits and alternatives for the proposed anesthesia with the patient or authorized representative who has indicated his/her understanding and acceptance.   Dental advisory given  Plan Discussed with: CRNA  Anesthesia Plan Comments:         Anesthesia Quick Evaluation

## 2011-01-21 MED ORDER — KETOROLAC TROMETHAMINE 15 MG/ML IJ SOLN
15.0000 mg | Freq: Four times a day (QID) | INTRAMUSCULAR | Status: DC
  Administered 2011-01-21 – 2011-01-22 (×5): 15 mg via INTRAVENOUS
  Filled 2011-01-21 (×8): qty 1

## 2011-01-21 MED ORDER — CHLORHEXIDINE GLUCONATE 0.12 % MT SOLN
15.0000 mL | Freq: Two times a day (BID) | OROMUCOSAL | Status: DC
  Administered 2011-01-21 – 2011-01-22 (×2): 15 mL via OROMUCOSAL
  Filled 2011-01-21 (×4): qty 15

## 2011-01-21 MED ORDER — HYDROCODONE-ACETAMINOPHEN 5-325 MG PO TABS
1.0000 | ORAL_TABLET | ORAL | Status: DC | PRN
  Administered 2011-01-21 (×3): 1 via ORAL
  Administered 2011-01-22: 2 via ORAL
  Filled 2011-01-21: qty 2
  Filled 2011-01-21 (×3): qty 1

## 2011-01-21 MED ORDER — BIOTENE DRY MOUTH MT LIQD
15.0000 mL | Freq: Two times a day (BID) | OROMUCOSAL | Status: DC
  Administered 2011-01-21 – 2011-01-22 (×3): 15 mL via OROMUCOSAL

## 2011-01-21 NOTE — Op Note (Signed)
NAMEMYISHIA, KASIK            ACCOUNT NO.:  [id number]  MEDICAL RECORD NO.:  [id number]  LOCATION:  1526                         FACILITY:  [facility]  PHYSICIAN:  Lodema Pilot, MD       DATE OF BIRTH:  Dec 20, 1955  DATE OF PROCEDURE:  01/20/2011 DATE OF DISCHARGE:                              OPERATIVE REPORT   PREOPERATIVE DIAGNOSIS:  Recurrent incisional hernia.  POSTOPERATIVE DIAGNOSIS:  Recurrent incisional hernia.  PROCEDURE:  Laparoscopic repair of recurrent incisional hernia with excision of prior mesh.  SURGEON:  Lodema Pilot, M.D.  ASSISTANT:  None.  ANESTHESIA:  General endotracheal tube anesthesia with 35 cc of 1% lidocaine with epinephrine and 0.25% Marcaine in a 50:50 mixture.  FLUIDS:  1200 cc crystalloid.  ESTIMATED BLOOD LOSS:  Minimal.  DRAINS:  None.  SPECIMENS:  None.  COMPLICATIONS:  None apparent.  FINDINGS:  A 3 cm x2 cm fascial defect with placement of a 12 cm x10 cm Physiomesh.  She still had some palpable knot after reduction of all the fat and hernia contents, which I think was due to some residual mesh where prior hernia repairs.  INDICATION FOR PROCEDURE:  Ms. Suman is a 56 year old female who has had 3 prior open umbilical hernia repairs some with mesh and has persistent hernia as well as incarcerated hernia and periumbilical bulge on exam.  OPERATIVE DETAILS:  Ms. Magloire was seen and evaluated in the preoperative area and risks and benefits of procedure were again discussed in lay terms.  Informed consent was obtained.  She was taken to the operating room, placed on the table in a supine position with her arms tucked bilaterally.  Foley catheter was placed and general endotracheal tube anesthesia was obtained.  Prophylactic antibiotics were given and her abdomen was prepped and draped in a standard surgical fashion.  Procedure time-out was performed with all operative team members to confirm proper patient, procedure, and the  abdomen was accessed in the left upper quadrant using a 5 mm Optiview trocar, and pneumoperitoneum was obtained.  Laparoscope was introduced and there was no evidence of bowel injury upon entry and she was noted to have some omental fat incarcerated up in a periumbilical hernia.  An 11 mm left lateral abdominal trocar was placed and two 5 mm right lateral abdominal trocars were placed under direct visualization.  Using sharp dissection, the adhesions were taken down from the abdominal wall.  The upper abdominal adhesions were loose adhesions to the falciform and clear filmy adhesive bands were divided with sharp dissection.  Then, I attempted to reduce the hernia contents and these would not come down without difficulty.  Using sharp dissection, I divided the edge of the hernia sac, and this allowed me to reduce the right omental hernia contents, which left a 1.5 cm x 1.5 cm defect.  There was no evidence of bowel contents approaching the abdominal wall.  This all appeared to be omental fat.  The omentum was stuck to appear to be prior mesh from his other hernia repairs and the omentum was taken down using sharp dissection.  A judicious use of cautery was used as well in this area because this omentum would not come  down without difficulty.  Again, there was no evidence of bowel in this area, however.  After the omental contents were taken down, there was no other adhesions to the abdominal wall.  She had some obvious mesh and Prolene sutures from her prior hernia repair, and I using the scissors and Bovie electrocautery, I did remove the chunk of this mesh from the abdominal wall, and any visible intra-abdominal mesh was removed using the scissors and some cautery after this mesh was removed and passed off the table.  The abdominal wall was inspected and she really only had this small 1.5 cm defect, but the area where I removed the other mesh actually appeared intact and did not appear  to have any fascial defect in this area.  However, I accounted for this when sizing the mesh.  This was approximately 3 cm x 2 cm and so I selected a 10 cm x 15 cm mesh and cut this to approximately 12 cm and felt this would provide adequate overlap at least 4 cm in each direction of the hernia defects as well as the other defect with the mesh was covering.  Despite having taken down all the intra-abdominal contents from the abdominal wall, she still had a palpable knot in the region of her hernia just to the right of her umbilicus, which I think is due to her prior surgeries as she likely has some other mesh still up in the subcutaneous tissue, and I did not remove this mesh, but there was no evidence of any residual hernia contents.  A #0 Gore-Tex sutures were placed at the perineal and caudad aspect of the mesh and the mesh was rolled up and placed into the abdomen and sutures were pulled up through the abdominal wall with at least 4 cm overlap and actually intra-abdominally.  Now, I measured intra-abdominally the sizes defect actually turned out to be approximately 5 cm of overlap with the closest margin.  The sutures were parachuted up to the abdominal wall and secured and the SecureStrap tacking device was used to place a few tacks around the perimeter and keep the mesh adhered to the abdominal wall, then placed a total of 7 transfascial sutures.  The remainder of the sutures were 2-0 Prolene sutures around the periphery of the mesh and mesh appeared across the abdominal wall and SecureStrap tacker was used to place tacks at the periphery of the mesh to prevent bowel from creeping up under the edge of the mesh.  The mesh appeared again to cover all hernia defects and titrated along the abdominal wall.  The abdominal wall was hemostatic and the intra-abdominal contents were again inspected and the omentum and bowel appeared hemostatic and there was no evidence of bowel injury. The 11  mm trocar site was approximated with 0-Vicryl suture using the Endoclose device and closed laparoscopically and the remainder of the trocars removed under direct visualization.  Abdominal wall was noted be hemostatic.  The skin edges were approximated 4-0 Monocryl subcutaneous suture and skin edges were approximated with Dermabond at all transfascial suture sites and incisions.  All sponge, needle, instrument counts were correct at the end of the case.  The patient tolerated the procedure well without apparent complications.          ______________________________ Lodema Pilot, MD     BL/MEDQ  D:  01/20/2011  T:  01/21/2011  Job:  [id number]

## 2011-01-21 NOTE — Progress Notes (Signed)
Patient ID: Megan [name], female   DOB: [birth year], 56 y.o.   MRN: [id number]  General Surgery - [facility] - Progress Note  POD# 1   Subjective: Patient ambulating.  Tolerating diet.  Complains of pain.  Voiding.  Objective: Vital signs in last 24 hours: Temp:  [97.8 F (36.6 C)-99.3 F (37.4 C)] 98.8 F (37.1 C) (01/05 0950) Pulse Rate:  [72-101] 72  (01/05 0950) Resp:  [16-18] 18  (01/05 0950) BP: (113-148)/(24-80) 148/78 mmHg (01/05 0950) SpO2:  [92 %-97 %] 97 % (01/05 0950) Weight:  [184 lb (83.462 kg)] 184 lb (83.462 kg) (01/04 1341) Last BM Date: 01/20/11  Intake/Output from previous day: 01/04 0701 - 01/05 0700 In: 2620 [P.O.:1120; I.V.:1500] Out: 2850 [Urine:2800; Blood:50]  Exam: HEENT - clear, not icteric Neck - soft, no mass Chest - clear bilaterally Cor - RRR, no murmur Abd - soft, mild distension; positive BS; wounds clear and dry; binder on Ext - no significant edema Neuro - grossly intact, no focal deficits  Lab Results:  No results found for this basename: WBC:2,HGB:2,HCT:2,PLT:2 in the last 72 hours  No results found for this basename: NA:2,K:2,CL:2,CO2:2,GLUCOSE:2,BUN:2,CREATININE:2,CALCIUM:2 in the last 72 hours  Studies/Results: No results found.  Assessment: Status post lap ventral incisional hernia repair with mesh  Plan: Advance diet Change pain Rx to Vicodin plus Toradol IV Ambulate Home tomorrow   Velora Heckler, MD, FACS General & Endocrine Surgery [facility]  01/21/2011

## 2011-01-22 MED ORDER — BISACODYL 10 MG RE SUPP
10.0000 mg | Freq: Once | RECTAL | Status: AC
  Administered 2011-01-22: 10 mg via RECTAL
  Filled 2011-01-22: qty 1

## 2011-01-22 MED ORDER — HYDROCODONE-ACETAMINOPHEN 5-325 MG PO TABS: 1.0000 | ORAL_TABLET | ORAL | Status: AC | PRN

## 2011-01-22 NOTE — Discharge Summary (Signed)
  Physician Discharge Summary [facility]  Patient ID: Megan [name] MRN: [id number] DOB/AGE: 03/14/1955 56 y.o.  Admit date: 01/20/2011 Discharge date: 01/22/2011  Admission Diagnoses:  Ventral incisional hernia  Discharge Diagnoses:  Active Problems:  * No active hospital problems. *    Discharged Condition: good  Hospital Course: Patient admitted after laparoscopic ventral incisional hernia repair with mesh.  Post op course without complication.  Pain control with narcotics and Toradol IV.  Prepared for discharge on 2nd post op day.  Consults: none  Significant Diagnostic Studies: none  Treatments: surgery: lap VH repair  Discharge Exam: Blood pressure 124/75, pulse 90, temperature 98.7 F (37.1 C), temperature source Oral, resp. rate 20, height 5\' 5"  (1.651 m), weight 184 lb (83.462 kg), SpO2 94.00%. HEENT - normal Chest - clear Cor - RRR Abd - soft, BS present; binder on; wounds clear and dry with Dermabond drsg  Disposition: Home   Current Discharge Medication List    CONTINUE these medications which have NOT CHANGED   Details  ALPRAZolam (XANAX) 0.25 MG tablet Take 0.25 mg by mouth 3 (three) times daily as needed. Anxiety     amLODipine (NORVASC) 5 MG tablet Take 5 mg by mouth daily with breakfast.     fluticasone (FLONASE) 50 MCG/ACT nasal spray Place 2 sprays into the nose daily as needed. Allergies     Multiple Vitamin (MULTIVITAMIN) tablet Take 1 tablet by mouth daily.     Saxagliptin-Metformin (KOMBIGLYZE XR) 05-998 MG TB24 Take by mouth daily.     vitamin B-12 (CYANOCOBALAMIN) 500 MCG tablet Take 1,000 mcg by mouth daily.     glucose blood (FREESTYLE LITE) test strip Use as instructed Qty: 100 each, Refills: 12    Lancets (FREESTYLE) lancets Use as instructed Qty: 100 each, Refills: 12       Velora Heckler, MD, FACS General & Endocrine Surgery [facility]   Signed: Velora Heckler 01/22/2011, 9:49  AM

## 2011-01-22 NOTE — Progress Notes (Signed)
Patient received d/c papers and able to understand information.  Will have her prescription filled for pain medication.  No problems upon discharge.  Patient transported outside in wheelchair and family transported home.

## 2011-01-24 ENCOUNTER — Encounter (HOSPITAL_COMMUNITY): Payer: Self-pay | Admitting: General Surgery

## 2011-01-24 ENCOUNTER — Telehealth: Payer: Self-pay

## 2011-01-24 NOTE — Telephone Encounter (Signed)
Triage VM:  Pt states she just got home from an umbilical hernia repair and pt has a cold.  Pt states she has been sneezing and coughing and it is making her stomach hurt.  Pt states she cannot drive to office and needs an rx sent in.   Called pt and advised to call the doctor that performed her operation for instructions on pt having a cold.  Pt advised to call that doctor in the morning and see if she can be seen if pt cannot be seen pt advised to call our office and have someone bring pt in for ov.  Pt states she will call back in the morning. Pt advised to go to ER if new symptoms occur with the surgical site.

## 2011-01-24 NOTE — Telephone Encounter (Signed)
Discussed with patient and made her aware of Dr.Lowne recommendations and she voiced understanding. She is not having any SOB but a runny nose. She is taking hydrocodone and wanted to make Dr. Laury Axon aware.     KP

## 2011-01-24 NOTE — Telephone Encounter (Signed)
She can take otc antihistamine ( claritin, allegra, or zyrtec)  And use delsym or mucinex dm for cough.

## 2011-02-03 ENCOUNTER — Ambulatory Visit (INDEPENDENT_AMBULATORY_CARE_PROVIDER_SITE_OTHER): Payer: 59 | Admitting: General Surgery

## 2011-02-03 ENCOUNTER — Encounter (INDEPENDENT_AMBULATORY_CARE_PROVIDER_SITE_OTHER): Payer: Self-pay | Admitting: General Surgery

## 2011-02-03 VITALS — BP 128/70 | HR 90 | Temp 98.3°F | Resp 24 | Ht 65.0 in | Wt 192.0 lb

## 2011-02-03 DIAGNOSIS — Z4889 Encounter for other specified surgical aftercare: Secondary | ICD-10-CM

## 2011-02-03 DIAGNOSIS — Z5189 Encounter for other specified aftercare: Secondary | ICD-10-CM

## 2011-02-03 NOTE — Progress Notes (Signed)
Subjective:     Patient ID: Megan [name], female   DOB: 07-13-1955, 56 y.o.   MRN: [id number]  HPI The patient follows up 2 weeks status post laparoscopic repair of recurrent incisional ventral hernia. She says she hasn't slept well since her surgery. She has some pulling in the area of her abdomen when she rolls from one side to another but she is not taking pain medication any longer. She states that her bowels have been slow as well and she's been taking prune juice for this. She is tolerating regular diet and has not had any nausea or vomiting. She is concerned about some dimpling around the incision in her right upper abdomen. Review of Systems     Objective:   Physical Exam No distress and nontoxic-appearing she appears to be doing fairly well. Her incisions are healing well without sign of infection. She has dimpling around one of the transfacial suture sites in the right upper abdomen but otherwise there is good cosmesis and no dimpling at the other sites. There is no evidence of hernia recurrence.    Assessment:     Status post laparoscopic hernia repair with mesh I think that she is doing fairly well for this stage postoperatively. I think a lot of her symptoms of pulling and discomfort are normal for this stage postoperatively. She is asking to go back to work and I gave her a work excuse to return later next week. I recommended that she remained without lifting and strenuous activity for another 3 weeks and at that time she can gradually increase her activity as tolerated. Some of her symptoms could be due to the prior mesh that she had as well. I did remove some of the mesh at the time of this surgery but she still had a palpable not intraoperatively despite the fact that I could see that there was nothing else remaining in the hernia defect. I think this is due to mesh that was used previously.    Plan:     She will follow up with me in 2 months if her symptoms persist or if she  is still dissatisfied with the cosmetics.

## 2011-03-27 ENCOUNTER — Ambulatory Visit (INDEPENDENT_AMBULATORY_CARE_PROVIDER_SITE_OTHER): Payer: 59 | Admitting: Family Medicine

## 2011-03-27 ENCOUNTER — Encounter: Payer: Self-pay | Admitting: Family Medicine

## 2011-03-27 VITALS — BP 118/70 | HR 95 | Temp 98.3°F | Wt 196.4 lb

## 2011-03-27 DIAGNOSIS — J329 Chronic sinusitis, unspecified: Secondary | ICD-10-CM

## 2011-03-27 DIAGNOSIS — D229 Melanocytic nevi, unspecified: Secondary | ICD-10-CM

## 2011-03-27 DIAGNOSIS — D492 Neoplasm of unspecified behavior of bone, soft tissue, and skin: Secondary | ICD-10-CM

## 2011-03-27 MED ORDER — CEFUROXIME AXETIL 500 MG PO TABS: 500.0000 mg | ORAL_TABLET | Freq: Two times a day (BID) | ORAL | Status: AC

## 2011-03-27 MED ORDER — FLUCONAZOLE 150 MG PO TABS: ORAL_TABLET | ORAL | Status: DC

## 2011-03-27 NOTE — Patient Instructions (Signed)

## 2011-03-27 NOTE — Progress Notes (Signed)
  Subjective:     Megan [name] is a 56 y.o. female who presents for evaluation of sinus pain. Symptoms include: congestion, facial pain, headaches, nasal congestion, purulent rhinorrhea and sinus pressure. Onset of symptoms was 1 week ago. Symptoms have been gradually worsening since that time. Past history is significant for no history of pneumonia or bronchitis. Patient is a non-smoker.  The following portions of the patient's history were reviewed and updated as appropriate: allergies, current medications, past family history, past medical history, past social history, past surgical history and problem list.  Review of Systems Pertinent items are noted in HPI.   Objective:    BP 118/70  Pulse 95  Temp(Src) 98.3 F (36.8 C) (Oral)  Wt 196 lb 6.4 oz (89.086 kg)  SpO2 97% General appearance: alert, cooperative, appears stated age and no distress Ears: normal TM's and external ear canals both ears Nose: green discharge, mild congestion, turbinates red, edematous, sinus tenderness bilateral Throat: lips, mucosa, and tongue normal; teeth and gums normal Neck: mild anterior cervical adenopathy, supple, symmetrical, trachea midline and thyroid not enlarged, symmetric, no tenderness/mass/nodules Lungs: clear to auscultation bilaterally Heart: S1, S2 normal   skin-- + suspicious mole L side neck--- black in center Assessment:    Acute bacterial sinusitis.   suspicous mole-- refer to derm Plan:    Nasal steroids per medication orders. Antihistamines per medication orders. Ceftin per medication orders.

## 2011-04-20 ENCOUNTER — Telehealth: Payer: Self-pay | Admitting: Family Medicine

## 2011-04-20 NOTE — Telephone Encounter (Signed)
[facility] Triage Call Report Triage Record Num: [id number] Operator: Freddie Breech Patient Name: Megan [name] Call Date & Time: 04/20/2011 10:26:29AM Patient Phone: [phone] PCP: Lelon Perla Patient Gender: Female PCP Fax : [phone] Patient DOB: 01-Sep-1955 Practice Name: [facility] Reason for Call: Caller: Michelle/Patient; PCP: Lelon Perla.; CB#: [phone]; Call regarding Congestion; Sinus congestion x 1 mth unrelieved with homecare measures. Afebrile per pt. Appt sched for 04/21/11 @ 1530 with Dr. Laury Axon per URI Protocol. Protocol(s) Used: Upper Respiratory Infection (URI) Recommended Outcome per Protocol: See Provider within 24 hours Reason for Outcome: Symptoms worsen after 7 days or symptoms do not improve after 14 days of home care Care Advice: ~ Use a cool mist humidifier to moisten air. Be sure to clean according to manufacturer's instructions. ~ Consider use of a saline nasal spray per package directions to help relieve nasal congestion. ~ Warm fluids may help, or try a mixture of honey and lemon juice in warm tea. ~ SYMPTOM / CONDITION MANAGEMENT ~ INFECTION CONTROL Go to the ED if new onset of stiff neck (unable to touch chin to chest), generalized headache, change in mental status, difficulty opening mouth, unable to swallow liquids or signs of dehydration. ~ Most adults need to drink 6-10 eight-ounce glasses (1.2-2.0 liters) of fluids per day unless previously told to limit fluid intake for other medical reasons. Limit fluids that contain caffeine, sugar or alcohol. Urine will be a very light yellow color when you drink enough fluids. ~ 04/04/

## 2011-04-20 NOTE — Telephone Encounter (Signed)
Caller: Michelle/Patient; PCP: Lelon Perla.; CB#: [phone];  Call regarding Congestion; Sinus congestion x 1 mth unrelieved with homecare measures. Afebrile per pt. Appt sched for 04/21/11 @ 1530 with Dr. Laury Axon per URI Protocol.

## 2011-04-21 ENCOUNTER — Ambulatory Visit: Payer: 59 | Admitting: Family Medicine

## 2011-05-30 ENCOUNTER — Ambulatory Visit: Payer: 59 | Admitting: Family Medicine

## 2011-06-01 ENCOUNTER — Encounter: Payer: Self-pay | Admitting: Family Medicine

## 2011-06-01 ENCOUNTER — Ambulatory Visit (INDEPENDENT_AMBULATORY_CARE_PROVIDER_SITE_OTHER): Payer: 59 | Admitting: Family Medicine

## 2011-06-01 VITALS — BP 124/80 | HR 92 | Temp 98.3°F | Wt 197.4 lb

## 2011-06-01 DIAGNOSIS — Z9109 Other allergy status, other than to drugs and biological substances: Secondary | ICD-10-CM

## 2011-06-01 DIAGNOSIS — J309 Allergic rhinitis, unspecified: Secondary | ICD-10-CM

## 2011-06-01 MED ORDER — PREDNISONE 10 MG PO TABS: ORAL_TABLET | ORAL | Status: DC

## 2011-06-01 MED ORDER — METHYLPREDNISOLONE ACETATE 80 MG/ML IJ SUSP
80.0000 mg | Freq: Once | INTRAMUSCULAR | Status: AC
  Administered 2011-06-01: 80 mg via INTRAMUSCULAR

## 2011-06-01 NOTE — Patient Instructions (Signed)
Allergies, Generic Allergies may happen from anything your body is sensitive to. This may be food, medicines, pollens, chemicals, and nearly anything around you in everyday life that produces allergens. An allergen is anything that causes an allergy producing substance. Heredity is often a factor in causing these problems. This means you may have some of the same allergies as your parents. Food allergies happen in all age groups. Food allergies are some of the most severe and life threatening. Some common food allergies are cow's milk, seafood, eggs, nuts, wheat, and soybeans. SYMPTOMS   Swelling around the mouth.   An itchy red rash or hives.   Vomiting or diarrhea.   Difficulty breathing.  SEVERE ALLERGIC REACTIONS ARE LIFE-THREATENING. This reaction is called anaphylaxis. It can cause the mouth and throat to swell and cause difficulty with breathing and swallowing. In severe reactions only a trace amount of food (for example, peanut oil in a salad) may cause death within seconds. Seasonal allergies occur in all age groups. These are seasonal because they usually occur during the same season every year. They may be a reaction to molds, grass pollens, or tree pollens. Other causes of problems are house dust mite allergens, pet dander, and mold spores. The symptoms often consist of nasal congestion, a runny itchy nose associated with sneezing, and tearing itchy eyes. There is often an associated itching of the mouth and ears. The problems happen when you come in contact with pollens and other allergens. Allergens are the particles in the air that the body reacts to with an allergic reaction. This causes you to release allergic antibodies. Through a chain of events, these eventually cause you to release histamine into the blood stream. Although it is meant to be protective to the body, it is this release that causes your discomfort. This is why you were given anti-histamines to feel better. If you are  unable to pinpoint the offending allergen, it may be determined by skin or blood testing. Allergies cannot be cured but can be controlled with medicine. Hay fever is a collection of all or some of the seasonal allergy problems. It may often be treated with simple over-the-counter medicine such as diphenhydramine. Take medicine as directed. Do not drink alcohol or drive while taking this medicine. Check with your caregiver or package insert for child dosages. If these medicines are not effective, there are many new medicines your caregiver can prescribe. Stronger medicine such as nasal spray, eye drops, and corticosteroids may be used if the first things you try do not work well. Other treatments such as immunotherapy or desensitizing injections can be used if all else fails. Follow up with your caregiver if problems continue. These seasonal allergies are usually not life threatening. They are generally more of a nuisance that can often be handled using medicine. HOME CARE INSTRUCTIONS   If unsure what causes a reaction, keep a diary of foods eaten and symptoms that follow. Avoid foods that cause reactions.   If hives or rash are present:   Take medicine as directed.   You may use an over-the-counter antihistamine (diphenhydramine) for hives and itching as needed.   Apply cold compresses (cloths) to the skin or take baths in cool water. Avoid hot baths or showers. Heat will make a rash and itching worse.   If you are severely allergic:   Following a treatment for a severe reaction, hospitalization is often required for closer follow-up.   Wear a medic-alert bracelet or necklace stating the allergy.     You and your family must learn how to give adrenaline or use an anaphylaxis kit.   If you have had a severe reaction, always carry your anaphylaxis kit or EpiPen with you. Use this medicine as directed by your caregiver if a severe reaction is occurring. Failure to do so could have a fatal  outcome.  SEEK MEDICAL CARE IF:  You suspect a food allergy. Symptoms generally happen within 30 minutes of eating a food.   Your symptoms have not gone away within 2 days or are getting worse.   You develop new symptoms.   You want to retest yourself or your child with a food or drink you think causes an allergic reaction. Never do this if an anaphylactic reaction to that food or drink has happened before. Only do this under the care of a caregiver.  SEEK IMMEDIATE MEDICAL CARE IF:   You have difficulty breathing, are wheezing, or have a tight feeling in your chest or throat.   You have a swollen mouth, or you have hives, swelling, or itching all over your body.   You have had a severe reaction that has responded to your anaphylaxis kit or an EpiPen. These reactions may return when the medicine has worn off. These reactions should be considered life threatening.  MAKE SURE YOU:   Understand these instructions.   Will watch your condition.   Will get help right away if you are not doing well or get worse.  Document Released: 03/28/2002 Document Revised: 12/22/2010 Document Reviewed: 09/02/2007 [employer] Patient Information 2012 [employer]. 

## 2011-06-01 NOTE — Progress Notes (Signed)
  Subjective:     Megan [name] is a 56 y.o. female who presents for evaluation of symptoms of a URI. Symptoms include nasal congestion, non productive cough, post nasal drip and sneezing. Onset of symptoms was since last visit  , and has been unchanged since that time. Treatment to date: antibiotics, antihistamines, cough suppressants and nasal steroids.  Pt gets sick every 3-4 months.  The following portions of the patient's history were reviewed and updated as appropriate: allergies, current medications, past family history, past medical history, past social history, past surgical history and problem list.  Review of Systems Pertinent items are noted in HPI.   Objective:    BP 124/80  Pulse 92  Temp(Src) 98.3 F (36.8 C) (Oral)  Wt 197 lb 6.4 oz (89.54 kg)  SpO2 98% General appearance: alert, cooperative, appears stated age and no distress Ears: normal TM's and external ear canals both ears Nose: clear discharge, mild congestion, no sinus tenderness Throat: lips, mucosa, and tongue normal; teeth and gums normal Neck: no adenopathy, no carotid bruit, no JVD, supple, symmetrical, trachea midline and thyroid not enlarged, symmetric, no tenderness/mass/nodules Lungs: clear to auscultation bilaterally Heart: regular rate and rhythm, S1, S2 normal, no murmur, click, rub or gallop   Assessment:    allergic rhinitis   Plan:    Suggested symptomatic OTC remedies. Nasal saline spray for congestion. Nasal steroids per orders. Follow up as needed.  pred taper [place] Allergist referral if no better

## 2011-06-01 NOTE — Progress Notes (Signed)
Addended by: Arnette Norris on: 06/01/2011 04:06 PM   Modules accepted: Orders

## 2011-06-11 ENCOUNTER — Other Ambulatory Visit: Payer: Self-pay | Admitting: Family Medicine

## 2011-06-15 ENCOUNTER — Ambulatory Visit (INDEPENDENT_AMBULATORY_CARE_PROVIDER_SITE_OTHER): Payer: 59 | Admitting: General Surgery

## 2011-06-15 VITALS — BP 130/80 | HR 84 | Temp 96.8°F | Resp 18 | Ht 65.0 in | Wt 192.0 lb

## 2011-06-15 DIAGNOSIS — R109 Unspecified abdominal pain: Secondary | ICD-10-CM

## 2011-06-15 NOTE — Progress Notes (Signed)
Subjective:     Patient ID: Megan [name], female   DOB: 17-May-1955, 56 y.o.   MRN: [id number]  HPI This patient is 4 months out from laparoscopic repair of recurrent incarcerated umbilical hernia with mesh. She had been doing well until approximately 10 days ago she sat up suddenly from her bed and felt a pulling the left side of her umbilicus. Since then she has some intermittent "pulling" in the area with certain movements. She denies any bulge in the area. She says that her bowels are normal for her which are normally constipated.  Review of Systems     Objective:   Physical Exam No obvious tenderness on exam, nondistended, no masses or bulges or evidence of recurrent hernia.    Assessment:     Abdominal pain status post ventral hernia repair Think that her symptoms are not uncommon as soon out from ventral hernia repair. She may have pulled a muscle or this could be suture site pain or mesh pain but I do not see any evidence of recurrent hernia at this time. I recommended that she rest for 2 weeks and take some anti-inflammatories and if her symptoms persist after 2 weeks, and she will call me up and we will check a CT scan to further evaluate for possible recurrence.     Plan:     Rest NSAIDS F/u in 2 weeks if pain persists and will check CT abdomen

## 2011-06-28 ENCOUNTER — Other Ambulatory Visit: Payer: Self-pay | Admitting: Family Medicine

## 2011-06-29 ENCOUNTER — Other Ambulatory Visit: Payer: Self-pay | Admitting: Family Medicine

## 2011-07-06 ENCOUNTER — Telehealth (INDEPENDENT_AMBULATORY_CARE_PROVIDER_SITE_OTHER): Payer: Self-pay | Admitting: General Surgery

## 2011-07-06 NOTE — Telephone Encounter (Signed)
PT CALLED TO REPORT THAT HER PULLING SENSATION IMPROVED AFTER SHE SAW DR. Biagio Quint. SHE HAD NOT TRIED ANY ANTIINFLAMMATORIES AT THAT TIME. SHE HAS NOTED SYMPTOMS AGAIN OVER LAST DAY OR TWO. I TOLD HER TO TRY ANTIINFLAMMATORIES TO SEE IF HER SYMPTOMS IMPROVE OVER THE NEXT 24 HOURS. SHE HAS NOT NOTED ANY BULGE. SHE WILL CALL TOMORROW IF PULLING SENSATION CONTINUES FOR FURTHER INSTRUCTIONS/GY

## 2011-09-01 ENCOUNTER — Other Ambulatory Visit: Payer: Self-pay | Admitting: Family Medicine

## 2011-09-15 ENCOUNTER — Other Ambulatory Visit: Payer: Self-pay | Admitting: Family Medicine

## 2011-09-18 IMAGING — CT CT PELVIS W/ CM
2 of 5 series · 16 of 46 positions shown, 18 images · IV contrast (agent unspecified)
Comparison: None

CT ABDOMEN

CLINICAL DATA: Hypertension.  Diabetes.  Generalized abdominal
pain.  Bloody diarrhea and vomiting.

CT ABDOMEN AND PELVIS WITH CONTRAST
TECHNIQUE: Multidetector CT imaging of the abdomen and pelvis was
performed following the standard protocol following the bolus
administration of intravenous contrast.
Contrast: 100  ml [id number]

[Series 2: routine abdomen · axial · 0.70mm/px · z∈[-441,-56]mm · 13 of 87 slices shown, 15 images]
[im 5/87  soft-tissue]
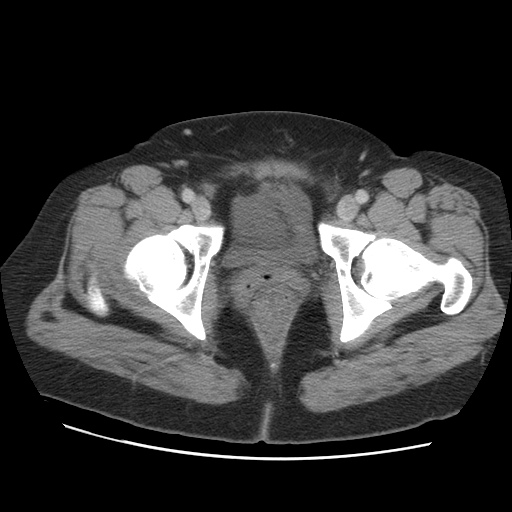
[im 5/87  bone]
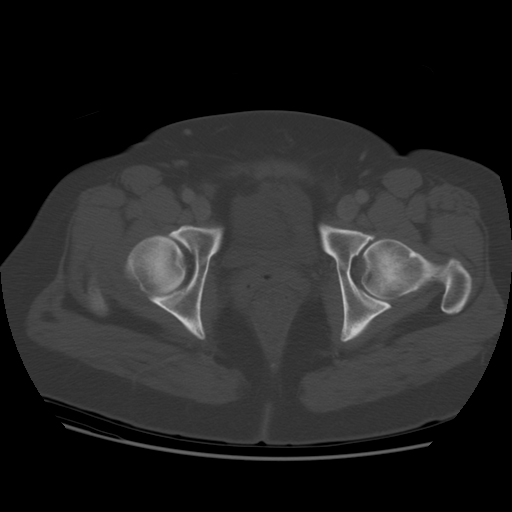
[im 13/87  soft-tissue]
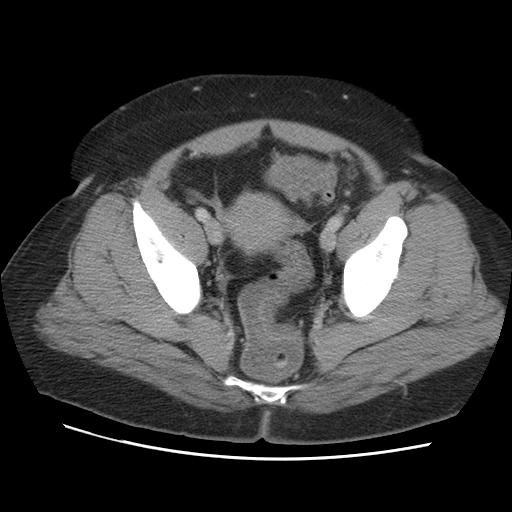
[im 18/87  soft-tissue]
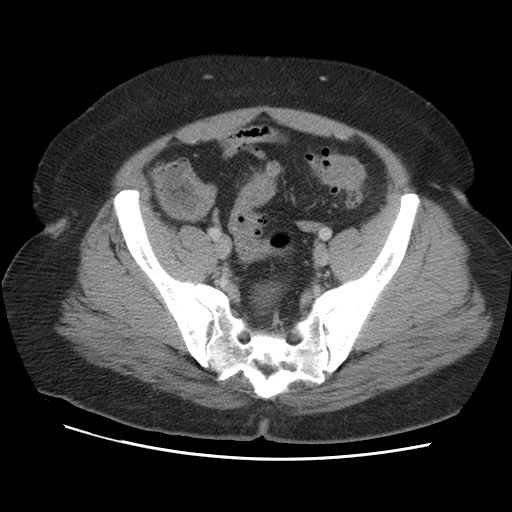
[im 26/87  soft-tissue]
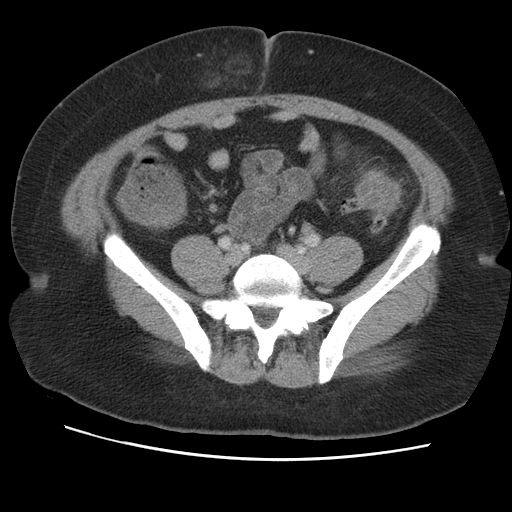
[im 31/87  soft-tissue]
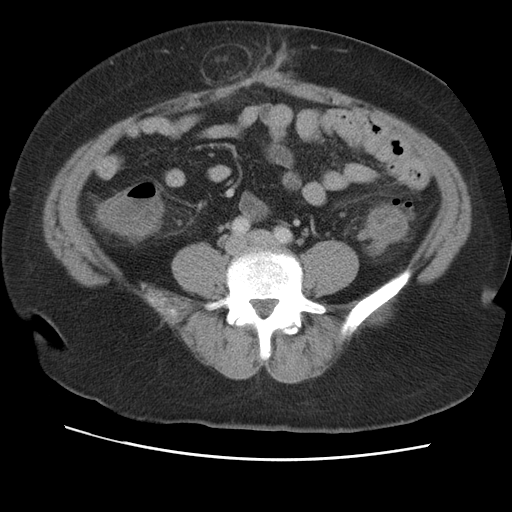
[im 39/87  soft-tissue]
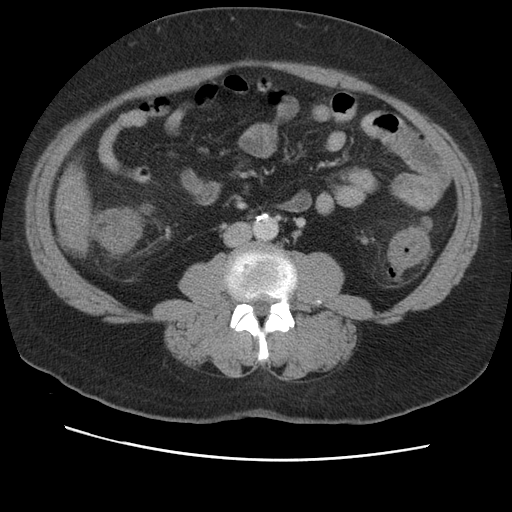
[im 44/87  soft-tissue]
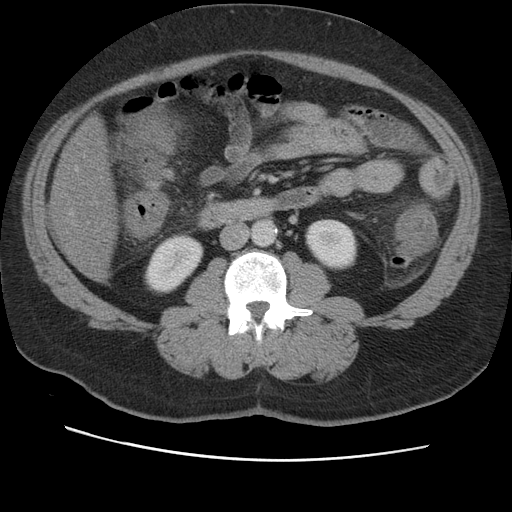
[im 48/87  soft-tissue]
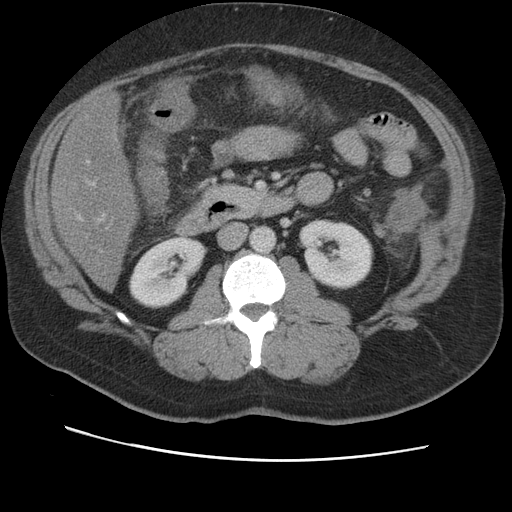
[im 56/87  soft-tissue]
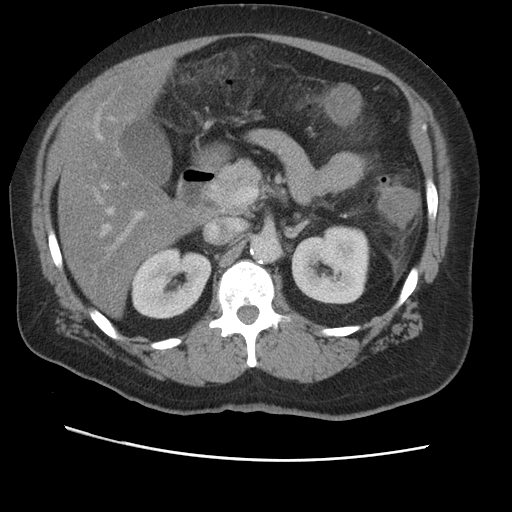
[im 56/87  bone]
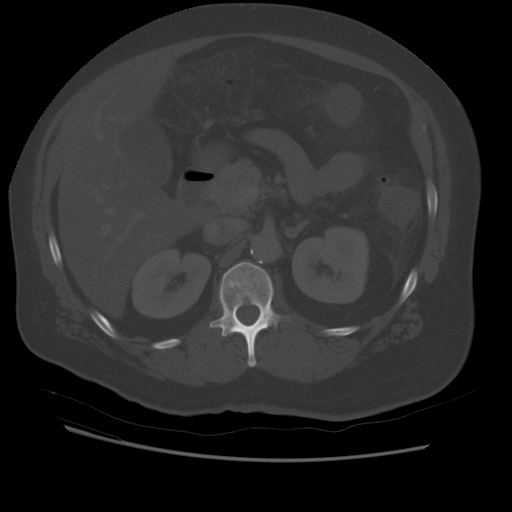
[im 61/87  soft-tissue]
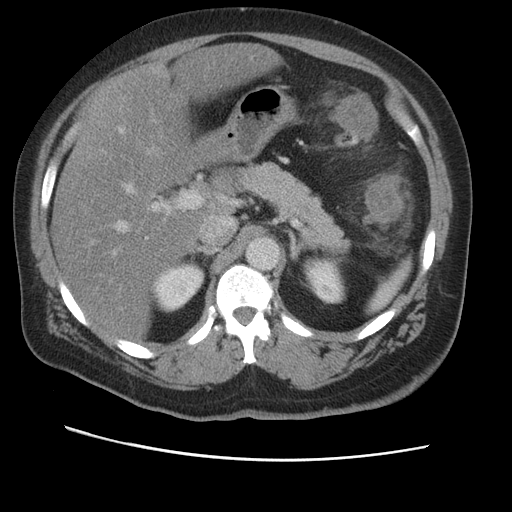
[im 69/87  soft-tissue]
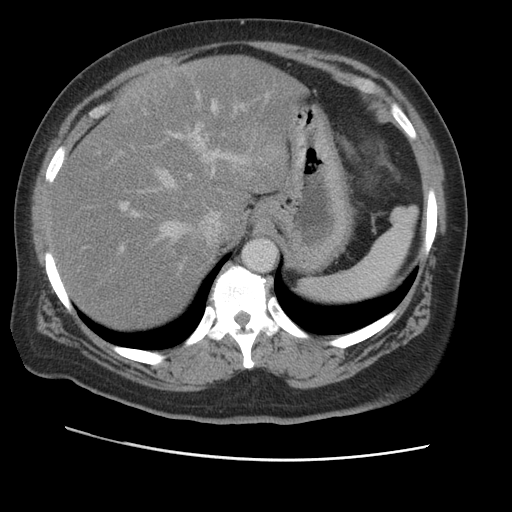
[im 74/87  soft-tissue]
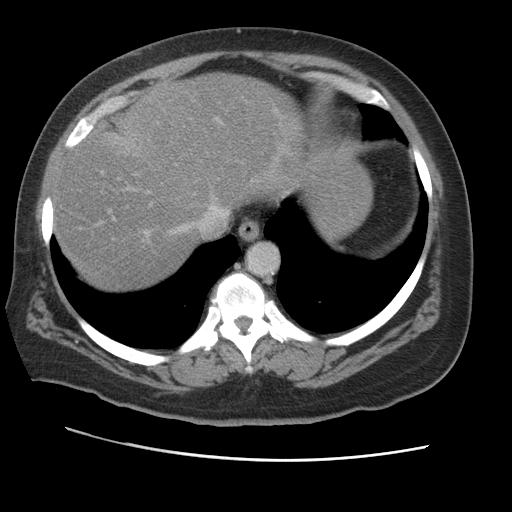
[im 82/87  soft-tissue]
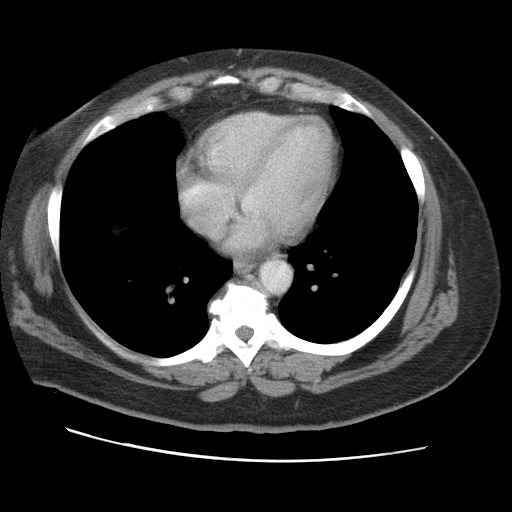

[Series 401: reformatted · coronal · 0.90mm/px · 3 of 109 slices shown]
[im 37/109  soft-tissue]
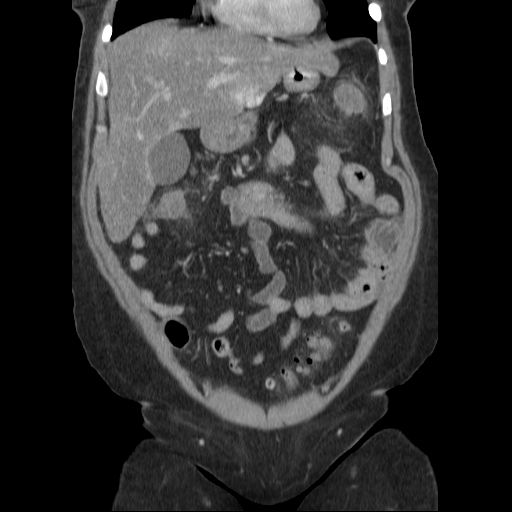
[im 49/109  soft-tissue]
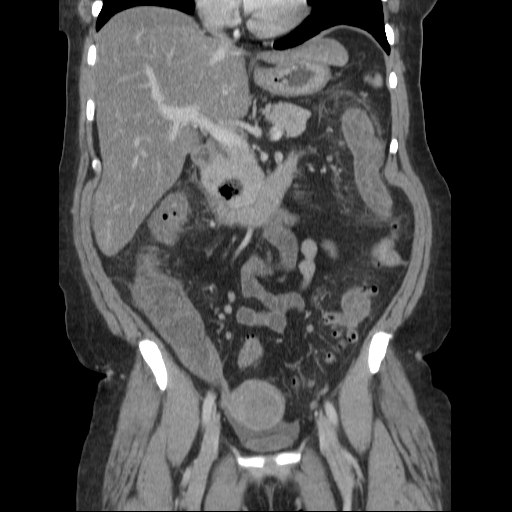
[im 61/109  soft-tissue]
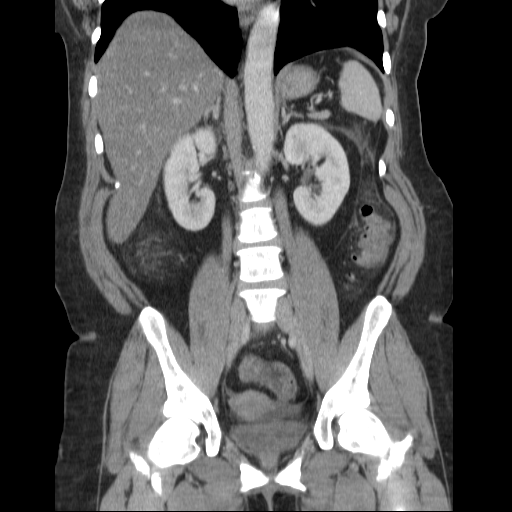

[16 of 46 positions shown; findings below may reference images not displayed]

FINDINGS: Clear lung bases.  Normal heart size without pericardial
or pleural effusion.  Moderate to marked fatty infiltration of the
liver with enlargement of the right lobe.  Trace perihepatic fluid.
Fat sparing along the fissure for the ligamentum stenosis.  Normal
spleen.  Suspect transverse duodenal diverticulum.  Normal
pancreas, gallbladder, adrenal glands, kidneys.  Relatively mild
aortic atherosclerosis with patent mesenteric vessels. No
retroperitoneal or retrocrural adenopathy. Diffuse moderate to
marked colonic wall thickening.  There are diverticula scattered
throughout the colon.  Surrounding pericolonic edema.  Normal
terminal ileum and appendix. Normal caliber abdominal small bowel
loops.  Right paracentral ventral abdominal wall hernia contains
only fat on image 57.
IMPRESSION: 1.  Diffuse colonic wall thickening.  Favor infectious etiologies.
Exclude C difficile colitis.  Inflammation such as ulcerative
colitis could look similar.
2.  Small amount of perihepatic fluid is likely secondary.
3.  Fatty infiltration of the liver and hepatomegaly.
4.  Fat containing ventral abdominal wall hernia.

CT PELVIS
FINDINGS: Wall thickening continues into the pelvic portions of
the colon and rectum.  Sigmoid diverticula.  Normal caliber pelvic
small bowel loops. No pelvic adenopathy.    Free fluid layering on
the urinary bladder.  Possible uterine fundal fibroid and image 78.
No adnexal mass.
IMPRESSION: 1.  Colitis continuing into the pelvis.  Please see abdominal
discussion.
2.  Small amount of pelvic fluid which is likely secondary.
3.  Probable uterine fundal fibroid.

## 2011-09-30 ENCOUNTER — Other Ambulatory Visit: Payer: Self-pay | Admitting: Family Medicine

## 2011-10-26 ENCOUNTER — Ambulatory Visit: Payer: 59 | Admitting: Family Medicine

## 2011-11-02 ENCOUNTER — Other Ambulatory Visit: Payer: Self-pay | Admitting: Family Medicine

## 2011-11-17 LAB — HM PAP SMEAR

## 2011-11-19 ENCOUNTER — Other Ambulatory Visit: Payer: Self-pay | Admitting: Family Medicine

## 2011-12-07 ENCOUNTER — Encounter: Payer: Self-pay | Admitting: Family Medicine

## 2011-12-07 ENCOUNTER — Ambulatory Visit (INDEPENDENT_AMBULATORY_CARE_PROVIDER_SITE_OTHER): Payer: 59 | Admitting: Family Medicine

## 2011-12-07 VITALS — BP 132/70 | HR 79 | Temp 98.2°F | Wt 189.6 lb

## 2011-12-07 DIAGNOSIS — E119 Type 2 diabetes mellitus without complications: Secondary | ICD-10-CM

## 2011-12-07 DIAGNOSIS — I10 Essential (primary) hypertension: Secondary | ICD-10-CM

## 2011-12-07 DIAGNOSIS — E785 Hyperlipidemia, unspecified: Secondary | ICD-10-CM

## 2011-12-07 LAB — HEPATIC FUNCTION PANEL
Alkaline Phosphatase: 77 U/L (ref 39–117)
Bilirubin, Direct: 0.1 mg/dL (ref 0.0–0.3)
Total Protein: 8 g/dL (ref 6.0–8.3)

## 2011-12-07 LAB — BASIC METABOLIC PANEL
BUN: 10 mg/dL (ref 6–23)
CO2: 29 mEq/L (ref 19–32)
Calcium: 9.1 mg/dL (ref 8.4–10.5)
Creatinine, Ser: 0.7 mg/dL (ref 0.4–1.2)
Glucose, Bld: 101 mg/dL — ABNORMAL HIGH (ref 70–99)

## 2011-12-07 LAB — LIPID PANEL
HDL: 45.2 mg/dL (ref >39.00)
Total CHOL/HDL Ratio: 3

## 2011-12-07 MED ORDER — FREESTYLE INSULINX TEST VI STRP: ORAL_STRIP | Status: DC

## 2011-12-07 NOTE — Assessment & Plan Note (Signed)
Check labs Pt refuses to take statin---"-too many side effects"

## 2011-12-07 NOTE — Progress Notes (Signed)
  Subjective:    Patient ID: CECILIA VANCLEVE, female    DOB: 03/24/1955, 56 y.o.   MRN: [id number]  HPI HYPERTENSION Disease Monitoring Blood pressure range-doesn't check Chest pain- no      Dyspnea- no Medications Compliance- good Lightheadedness- no   Edema- no   DIABETES Disease Monitoring Blood Sugar ranges-good Polyuria- no New Visual problems- no Medications Compliance- good Hypoglycemic symptoms- no   HYPERLIPIDEMIA Disease Monitoring See symptoms for Hypertension Medications Compliance- poor-- pt refuses to take  "they have too many side effects" RUQ pain- no  Muscle aches- no  ROS See HPI above   PMH Smoking Status noted     Review of Systems As above    Objective:   Physical Exam  BP 132/70  Pulse 79  Temp 98.2 F (36.8 C) (Oral)  Wt 189 lb 9.6 oz (86.002 kg)  SpO2 97% General appearance: alert, cooperative, appears stated age and no distress Neck: no adenopathy, supple, symmetrical, trachea midline and thyroid not enlarged, symmetric, no tenderness/mass/nodules Lungs: clear to auscultation bilaterally Heart: S1, S2 normal Extremities: extremities normal, atraumatic, no cyanosis or edema  Sensory exam of the foot is normal, tested with the monofilament. Good pulses, no lesions or ulcers, good peripheral pulses.     Assessment & Plan:

## 2011-12-07 NOTE — Assessment & Plan Note (Signed)
Stable con't meds 

## 2011-12-07 NOTE — Patient Instructions (Addendum)

## 2011-12-07 NOTE — Assessment & Plan Note (Signed)
hgba1c  6.6  con't meds

## 2011-12-08 LAB — POCT URINALYSIS DIPSTICK
Bilirubin, UA: NEGATIVE
Leukocytes, UA: NEGATIVE
Nitrite, UA: NEGATIVE
Protein, UA: NEGATIVE
pH, UA: 6.5

## 2011-12-18 ENCOUNTER — Other Ambulatory Visit: Payer: Self-pay | Admitting: Family Medicine

## 2011-12-18 ENCOUNTER — Other Ambulatory Visit: Payer: Self-pay | Admitting: Obstetrics and Gynecology

## 2011-12-19 NOTE — Telephone Encounter (Signed)
Rx sent.    MW 

## 2012-01-11 ENCOUNTER — Ambulatory Visit (INDEPENDENT_AMBULATORY_CARE_PROVIDER_SITE_OTHER): Payer: 59 | Admitting: Internal Medicine

## 2012-01-11 VITALS — BP 138/82 | HR 94 | Temp 99.1°F | Wt 189.0 lb

## 2012-01-11 DIAGNOSIS — R059 Cough, unspecified: Secondary | ICD-10-CM

## 2012-01-11 DIAGNOSIS — J069 Acute upper respiratory infection, unspecified: Secondary | ICD-10-CM

## 2012-01-11 DIAGNOSIS — J029 Acute pharyngitis, unspecified: Secondary | ICD-10-CM

## 2012-01-11 DIAGNOSIS — R05 Cough: Secondary | ICD-10-CM

## 2012-01-11 MED ORDER — HYDROCODONE-HOMATROPINE 5-1.5 MG/5ML PO SYRP
5.0000 mL | ORAL_SOLUTION | Freq: Four times a day (QID) | ORAL | Status: DC | PRN
Start: 2012-01-11 — End: 2012-04-26

## 2012-01-11 NOTE — Patient Instructions (Addendum)
Plain Mucinex for thick secretions ;force NON dairy fluids . Use a Neti pot daily as needed for sinus congestion; going from open side to congested side . Nasal cleansing in the shower as discussed. Make sure that all residual soap is removed to prevent irritation. Nasonex  1 spray in each nostril twice a day as needed. Use the "crossover" technique as discussed. Plain Allegra 160 daily as needed for itchy eyes & sneezing. NSAIDS ( Aleve, Advil, Naproxen) or Tylenol every 4 hrs as needed for fever as discussed based on label recommendations    Zicam Melts or Zinc lozenges ; vitamin C 2000 mg daily; & Echinacea for 4-7 days. Report significant fever, exudate("pus") or progressive pain.

## 2012-01-11 NOTE — Progress Notes (Signed)
  Subjective:    Patient ID: Megan [name], female    DOB: [birth year], 56 y.o.   MRN: [id number]  HPI The respiratory tract symptoms began  01/08/12 as head congestion , facial pain, fever & sore throat .   Temp was not taken ; no chills and sweats were present   Cough was not associated with shortness of breath or wheezing  Sputum was described as clear as were nasal secretions     Treatment with  Nasal saline was partially effective  There is no history of asthma. The patient had  quit smoking 17 yearsago                  Review of Systems Symptoms not present included frontal headache,  dental pain,  nasal purulence, earache , and otic discharge Itchy , watery eyes & sneezing were not noted.     Objective:   Physical Exam  General appearance:good health ;well nourished; no acute distress or increased work of breathing is present.  No  lymphadenopathy about the head, neck, or axilla noted.  Eyes: No conjunctival inflammation or lid edema is present. . Ears:  External ear exam shows no significant lesions or deformities.  Otoscopic examination reveals clear canals, tympanic membranes are intact bilaterally without bulging, retraction, inflammation or discharge. Nose:  External nasal examination shows no deformity or inflammation. Nasal mucosa are boggy especially on L without lesions or exudates. No septal dislocation or deviation.No obstruction to airflow. Hyponasal speech Oral exam: Dental hygiene is good; lips and gums are healthy appearing.There is mild  oropharyngeal erythema ; no exudate noted.  Neck:  No deformities, masses, or tenderness noted.   Supple with full range of motion without pain.  Heart:  Normal rate and regular rhythm. S1 and S2 normal without gallop, murmur, click, rub or other extra sounds. S 4 Lungs:Chest clear to auscultation; no wheezes, rhonchi,rales ,or rubs present.No increased work of breathing.  Dry cough Extremities:  No cyanosis,  edema, or clubbing  noted  Skin: Warm & dry         Assessment & Plan:  #1 pharyngitis  # URI with rhinitis Plan: See orders and recommendations

## 2012-02-24 ENCOUNTER — Other Ambulatory Visit: Payer: Self-pay | Admitting: Family Medicine

## 2012-03-06 ENCOUNTER — Encounter: Payer: Self-pay | Admitting: Family Medicine

## 2012-03-06 ENCOUNTER — Ambulatory Visit (INDEPENDENT_AMBULATORY_CARE_PROVIDER_SITE_OTHER): Payer: 59 | Admitting: Family Medicine

## 2012-03-06 VITALS — BP 130/70 | HR 77 | Temp 98.3°F | Ht 65.0 in | Wt 194.8 lb

## 2012-03-06 DIAGNOSIS — L0201 Cutaneous abscess of face: Secondary | ICD-10-CM

## 2012-03-06 DIAGNOSIS — L03211 Cellulitis of face: Secondary | ICD-10-CM

## 2012-03-06 DIAGNOSIS — R21 Rash and other nonspecific skin eruption: Secondary | ICD-10-CM

## 2012-03-14 DIAGNOSIS — R21 Rash and other nonspecific skin eruption: Secondary | ICD-10-CM | POA: Insufficient documentation

## 2012-03-14 DIAGNOSIS — L03211 Cellulitis of face: Secondary | ICD-10-CM | POA: Insufficient documentation

## 2012-03-14 DIAGNOSIS — L0201 Cutaneous abscess of face: Secondary | ICD-10-CM | POA: Insufficient documentation

## 2012-03-14 NOTE — Progress Notes (Signed)
  Subjective:    Patient ID: Megan [name], female    DOB: 03/16/1955, 57 y.o.   MRN: [id number]  HPI Bug bite- pt woke Monday w/ bite on nose.  Red, mildly TTP.  Also developed 'rash' on chest.  No fever, no drainage from bite.  Not itching.  Pt is diabetic.  Using rubbing ETOH and neosporin on bite.  Took benadryl yesterday.  Has hx of yeast infxns w/ abx use.   Review of Systems For ROS see HPI     Objective:   Physical Exam  Vitals reviewed. Constitutional: She appears well-developed and well-nourished. No distress.  Skin: Skin is warm and dry. Rash (faint macular rash on anterior chest) noted. There is erythema (at tip of nose w/ small central white head, no TTP, no surrounding induration).          Assessment & Plan:

## 2012-03-14 NOTE — Assessment & Plan Note (Signed)
New.  Very faint on upper chest.  Not bothersome to pt.  Start topical hydrocortisone prn.  Pt expressed understanding and is in agreement w/ plan.

## 2012-03-14 NOTE — Assessment & Plan Note (Signed)
New.  Given redness w/ central white head and pt's hx of DM, will start abx.  Reviewed supportive care and red flags that should prompt return.  Pt expressed understanding and is in agreement w/ plan.

## 2012-03-22 ENCOUNTER — Other Ambulatory Visit: Payer: Self-pay | Admitting: Family Medicine

## 2012-03-25 ENCOUNTER — Other Ambulatory Visit: Payer: Self-pay | Admitting: Family Medicine

## 2012-04-26 ENCOUNTER — Telehealth: Payer: Self-pay | Admitting: Family Medicine

## 2012-04-26 ENCOUNTER — Other Ambulatory Visit: Payer: Self-pay | Admitting: Family Medicine

## 2012-04-26 ENCOUNTER — Ambulatory Visit (HOSPITAL_BASED_OUTPATIENT_CLINIC_OR_DEPARTMENT_OTHER)
Admission: RE | Admit: 2012-04-26 | Discharge: 2012-04-26 | Disposition: A | Discharge status: Home / Self Care | Payer: 59 | Source: Ambulatory Visit | Attending: Family Medicine | Admitting: Family Medicine

## 2012-04-26 ENCOUNTER — Ambulatory Visit (INDEPENDENT_AMBULATORY_CARE_PROVIDER_SITE_OTHER): Payer: 59 | Admitting: Family Medicine

## 2012-04-26 ENCOUNTER — Encounter: Payer: Self-pay | Admitting: Family Medicine

## 2012-04-26 VITALS — BP 130/72 | HR 95 | Temp 99.2°F | Ht 65.5 in | Wt 195.2 lb

## 2012-04-26 DIAGNOSIS — J45909 Unspecified asthma, uncomplicated: Secondary | ICD-10-CM

## 2012-04-26 DIAGNOSIS — R0602 Shortness of breath: Secondary | ICD-10-CM

## 2012-04-26 DIAGNOSIS — R079 Chest pain, unspecified: Secondary | ICD-10-CM

## 2012-04-26 DIAGNOSIS — E119 Type 2 diabetes mellitus without complications: Secondary | ICD-10-CM

## 2012-04-26 DIAGNOSIS — R0789 Other chest pain: Secondary | ICD-10-CM | POA: Insufficient documentation

## 2012-04-26 DIAGNOSIS — J40 Bronchitis, not specified as acute or chronic: Secondary | ICD-10-CM

## 2012-04-26 LAB — HEPATIC FUNCTION PANEL
AST: 31 U/L (ref 0–37)
Albumin: 4 g/dL (ref 3.5–5.2)
Alkaline Phosphatase: 90 U/L (ref 39–117)
Indirect Bilirubin: 0.4 mg/dL (ref 0.0–0.9)
Total Bilirubin: 0.5 mg/dL (ref 0.3–1.2)

## 2012-04-26 LAB — LIPID PANEL
Cholesterol: 160 mg/dL (ref 0–200)
Total CHOL/HDL Ratio: 3.2 Ratio
Triglycerides: 200 mg/dL — ABNORMAL HIGH (ref <150)
VLDL: 40 mg/dL (ref 0–40)

## 2012-04-26 LAB — CBC WITH DIFFERENTIAL/PLATELET
Basophils Absolute: 0.1 10*3/uL (ref 0.0–0.1)
Eosinophils Relative: 2 % (ref 0–5)
HCT: 36.6 % (ref 36.0–46.0)
Hemoglobin: 12.1 g/dL (ref 12.0–15.0)
Lymphocytes Relative: 30 % (ref 12–46)
MCHC: 33.1 g/dL (ref 30.0–36.0)
MCV: 65.4 fL — ABNORMAL LOW (ref 78.0–100.0)
Monocytes Absolute: 0.8 10*3/uL (ref 0.1–1.0)
Monocytes Relative: 8 % (ref 3–12)
Neutro Abs: 6.4 10*3/uL (ref 1.7–7.7)
RDW: 16.4 % — ABNORMAL HIGH (ref 11.5–15.5)
WBC: 10.6 10*3/uL — ABNORMAL HIGH (ref 4.0–10.5)

## 2012-04-26 LAB — BASIC METABOLIC PANEL
CO2: 26 mEq/L (ref 19–32)
Glucose, Bld: 89 mg/dL (ref 70–99)
Potassium: 4 mEq/L (ref 3.5–5.3)
Sodium: 143 mEq/L (ref 135–145)

## 2012-04-26 LAB — TROPONIN I

## 2012-04-26 LAB — HEMOGLOBIN A1C: Mean Plasma Glucose: 140 mg/dL — ABNORMAL HIGH (ref <117)

## 2012-04-26 MED ORDER — ALBUTEROL SULFATE HFA 108 (90 BASE) MCG/ACT IN AERS: 2.0000 | INHALATION_SPRAY | Freq: Four times a day (QID) | RESPIRATORY_TRACT | Status: DC | PRN

## 2012-04-26 MED ORDER — BECLOMETHASONE DIPROPIONATE 40 MCG/ACT IN AERS: 2.0000 | INHALATION_SPRAY | Freq: Two times a day (BID) | RESPIRATORY_TRACT | Status: DC

## 2012-04-26 MED ORDER — AZITHROMYCIN 250 MG PO TABS: ORAL_TABLET | ORAL | Status: DC

## 2012-04-26 NOTE — Progress Notes (Signed)
  Subjective:    Megan [name] is a 57 y.o. female who presents for evaluation of chest pain. Onset was severl weeks ago. Symptoms have been unchanged since that time. The patient describes the pain as heaviness, pressure and does not radiate. . Associated symptoms are: chest pain, chest pressure/discomfort and dyspnea. Aggravating factors are: coughing, exercise and walking. Alleviating factors are: rest. Patient's cardiac risk factors are: diabetes mellitus, dyslipidemia, hypertension and sedentary lifestyle. Patient's risk factors for DVT/PE: none. Previous cardiac testing: electrocardiogram (ECG).  The following portions of the patient's history were reviewed and updated as appropriate: allergies, current medications, past family history, past medical history, past social history, past surgical history and problem list.  Review of Systems Pertinent items are noted in HPI.    Objective:    BP 130/72  Pulse 95  Temp(Src) 99.2 F (37.3 C) (Oral)  Ht 5' 5.5" (1.664 m)  Wt 195 lb 3.2 oz (88.542 kg)  BMI 31.98 kg/m2  SpO2 97% General appearance: alert, cooperative, appears stated age and no distress Neck: no adenopathy, no carotid bruit, no JVD, supple, symmetrical, trachea midline and thyroid not enlarged, symmetric, no tenderness/mass/nodules Lungs: clear to auscultation bilaterally Heart: S1, S2 normal Extremities: extremities normal, atraumatic, no cyanosis or edema  Cardiographics ECG: normal sinus rhythm, no blocks or conduction defects, no ischemic changes  Imaging Chest x-ray: not available for review    Assessment:    Chest pain, suspected etiology: asthma    Plan:    Conservative measures indicated. Worsening signs and symptoms discussed and patient verbalized understanding. check labs and cxr, pulm func c/w asthma

## 2012-04-26 NOTE — Telephone Encounter (Signed)
Opened in error

## 2012-04-26 NOTE — Telephone Encounter (Deleted)
Opened in error

## 2012-04-26 NOTE — Patient Instructions (Signed)

## 2012-04-27 LAB — MICROALBUMIN / CREATININE URINE RATIO: Creatinine, Urine: 109.2 mg/dL

## 2012-04-29 ENCOUNTER — Telehealth: Payer: Self-pay | Admitting: Family Medicine

## 2012-04-29 DIAGNOSIS — E049 Nontoxic goiter, unspecified: Secondary | ICD-10-CM

## 2012-04-29 NOTE — Telephone Encounter (Signed)
Discussed with patient and she voiced understanding. Ref put in      [place]

## 2012-04-29 NOTE — Telephone Encounter (Signed)
Message copied by Arnette Norris on Mon Apr 29, 2012  1:49 PM ------      Message from: Lelon Perla      Created: Fri Apr 26, 2012  5:06 PM       ? Right thyroid enlargement      Lungs normal      US thyroid ------

## 2012-04-29 NOTE — Telephone Encounter (Signed)
[facility] Fax: [phone] From: [facility] Date/ Time: 04/26/2012 6:49 PM Taken By: Arville Lime, CSR Caller: Zella Ball Facility: [facility] Patient: Megan [name], Megan [name] DOB: 1955-09-20 Phone: [phone] Reason for Call: Zella Ball is calling from [place] regarding a D-Dimer ordered on Ardeen Jourdain by Dr. Lowne4/11/2012 3:25:00 PM. The results were within normal range at 0.34. Regarding Appointment: Appt Date: Appt Time: Unknown Provider: Reason: Details: Outcome:

## 2012-04-30 LAB — CK TOTAL AND CKMB (NOT AT ARMC)

## 2012-05-02 ENCOUNTER — Ambulatory Visit (HOSPITAL_BASED_OUTPATIENT_CLINIC_OR_DEPARTMENT_OTHER)
Admission: RE | Admit: 2012-05-02 | Discharge: 2012-05-02 | Disposition: A | Discharge status: Home / Self Care | Payer: 59 | Source: Ambulatory Visit | Attending: Family Medicine | Admitting: Family Medicine

## 2012-05-02 DIAGNOSIS — E049 Nontoxic goiter, unspecified: Secondary | ICD-10-CM | POA: Insufficient documentation

## 2012-05-03 ENCOUNTER — Encounter: Payer: Self-pay | Admitting: Family Medicine

## 2012-05-03 ENCOUNTER — Ambulatory Visit (INDEPENDENT_AMBULATORY_CARE_PROVIDER_SITE_OTHER): Payer: 59 | Admitting: Family Medicine

## 2012-05-03 VITALS — BP 128/70 | HR 87 | Temp 98.2°F | Resp 16 | Ht 65.25 in | Wt 192.2 lb

## 2012-05-03 DIAGNOSIS — E041 Nontoxic single thyroid nodule: Secondary | ICD-10-CM

## 2012-05-03 DIAGNOSIS — Z0000 Encounter for general adult medical examination without abnormal findings: Secondary | ICD-10-CM

## 2012-05-03 DIAGNOSIS — E119 Type 2 diabetes mellitus without complications: Secondary | ICD-10-CM

## 2012-05-03 DIAGNOSIS — E785 Hyperlipidemia, unspecified: Secondary | ICD-10-CM

## 2012-05-03 DIAGNOSIS — Z23 Encounter for immunization: Secondary | ICD-10-CM

## 2012-05-03 DIAGNOSIS — R7989 Other specified abnormal findings of blood chemistry: Secondary | ICD-10-CM

## 2012-05-03 DIAGNOSIS — I10 Essential (primary) hypertension: Secondary | ICD-10-CM

## 2012-05-03 DIAGNOSIS — R809 Proteinuria, unspecified: Secondary | ICD-10-CM

## 2012-05-03 NOTE — Assessment & Plan Note (Signed)
Stable con't meds 

## 2012-05-03 NOTE — Patient Instructions (Addendum)
Preventive Care for Adults, Female A healthy lifestyle and preventive care can promote health and wellness. Preventive health guidelines for women include the following key practices.  A routine yearly physical is a good way to check with your caregiver about your health and preventive screening. It is a chance to share any concerns and updates on your health, and to receive a thorough exam.  Visit your dentist for a routine exam and preventive care every 6 months. Brush your teeth twice a day and floss once a day. Good oral hygiene prevents tooth decay and gum disease.  The frequency of eye exams is based on your age, health, family medical history, use of contact lenses, and other factors. Follow your caregiver's recommendations for frequency of eye exams.  Eat a healthy diet. Foods like vegetables, fruits, whole grains, low-fat dairy products, and lean protein foods contain the nutrients you need without too many calories. Decrease your intake of foods high in solid fats, added sugars, and salt. Eat the right amount of calories for you.Get information about a proper diet from your caregiver, if necessary.  Regular physical exercise is one of the most important things you can do for your health. Most adults should get at least 150 minutes of moderate-intensity exercise (any activity that increases your heart rate and causes you to sweat) each week. In addition, most adults need muscle-strengthening exercises on 2 or more days a week.  Maintain a healthy weight. The body mass index (BMI) is a screening tool to identify possible weight problems. It provides an estimate of body fat based on height and weight. Your caregiver can help determine your BMI, and can help you achieve or maintain a healthy weight.For adults 20 years and older:  A BMI below 18.5 is considered underweight.  A BMI of 18.5 to 24.9 is normal.  A BMI of 25 to 29.9 is considered overweight.  A BMI of 30 and above is  considered obese.  Maintain normal blood lipids and cholesterol levels by exercising and minimizing your intake of saturated fat. Eat a balanced diet with plenty of fruit and vegetables. Blood tests for lipids and cholesterol should begin at age 20 and be repeated every 5 years. If your lipid or cholesterol levels are high, you are over 50, or you are at high risk for heart disease, you may need your cholesterol levels checked more frequently.Ongoing high lipid and cholesterol levels should be treated with medicines if diet and exercise are not effective.  If you smoke, find out from your caregiver how to quit. If you do not use tobacco, do not start.  If you are pregnant, do not drink alcohol. If you are breastfeeding, be very cautious about drinking alcohol. If you are not pregnant and choose to drink alcohol, do not exceed 1 drink per day. One drink is considered to be 12 ounces (355 mL) of beer, 5 ounces (148 mL) of wine, or 1.5 ounces (44 mL) of liquor.  Avoid use of street drugs. Do not share needles with anyone. Ask for help if you need support or instructions about stopping the use of drugs.  High blood pressure causes heart disease and increases the risk of stroke. Your blood pressure should be checked at least every 1 to 2 years. Ongoing high blood pressure should be treated with medicines if weight loss and exercise are not effective.  If you are 55 to 59 years old, ask your caregiver if you should take aspirin to prevent strokes.  Diabetes   screening involves taking a blood sample to check your fasting blood sugar level. This should be done once every 3 years, after age 45, if you are within normal weight and without risk factors for diabetes. Testing should be considered at a younger age or be carried out more frequently if you are overweight and have at least 1 risk factor for diabetes.  Breast cancer screening is essential preventive care for women. You should practice "breast  self-awareness." This means understanding the normal appearance and feel of your breasts and may include breast self-examination. Any changes detected, no matter how small, should be reported to a caregiver. Women in their 20s and 30s should have a clinical breast exam (CBE) by a caregiver as part of a regular health exam every 1 to 3 years. After age 40, women should have a CBE every year. Starting at age 40, women should consider having a mammography (breast X-ray test) every year. Women who have a family history of breast cancer should talk to their caregiver about genetic screening. Women at a high risk of breast cancer should talk to their caregivers about having magnetic resonance imaging (MRI) and a mammography every year.  The Pap test is a screening test for cervical cancer. A Pap test can show cell changes on the cervix that might become cervical cancer if left untreated. A Pap test is a procedure in which cells are obtained and examined from the lower end of the uterus (cervix).  Women should have a Pap test starting at age 21.  Between ages 21 and 29, Pap tests should be repeated every 2 years.  Beginning at age 30, you should have a Pap test every 3 years as long as the past 3 Pap tests have been normal.  Some women have medical problems that increase the chance of getting cervical cancer. Talk to your caregiver about these problems. It is especially important to talk to your caregiver if a new problem develops soon after your last Pap test. In these cases, your caregiver may recommend more frequent screening and Pap tests.  The above recommendations are the same for women who have or have not gotten the vaccine for human papillomavirus (HPV).  If you had a hysterectomy for a problem that was not cancer or a condition that could lead to cancer, then you no longer need Pap tests. Even if you no longer need a Pap test, a regular exam is a good idea to make sure no other problems are  starting.  If you are between ages 65 and 70, and you have had normal Pap tests going back 10 years, you no longer need Pap tests. Even if you no longer need a Pap test, a regular exam is a good idea to make sure no other problems are starting.  If you have had past treatment for cervical cancer or a condition that could lead to cancer, you need Pap tests and screening for cancer for at least 20 years after your treatment.  If Pap tests have been discontinued, risk factors (such as a new sexual partner) need to be reassessed to determine if screening should be resumed.  The HPV test is an additional test that may be used for cervical cancer screening. The HPV test looks for the virus that can cause the cell changes on the cervix. The cells collected during the Pap test can be tested for HPV. The HPV test could be used to screen women aged 30 years and older, and should   be used in women of any age who have unclear Pap test results. After the age of 30, women should have HPV testing at the same frequency as a Pap test.  Colorectal cancer can be detected and often prevented. Most routine colorectal cancer screening begins at the age of 50 and continues through age 75. However, your caregiver may recommend screening at an earlier age if you have risk factors for colon cancer. On a yearly basis, your caregiver may provide home test kits to check for hidden blood in the stool. Use of a small camera at the end of a tube, to directly examine the colon (sigmoidoscopy or colonoscopy), can detect the earliest forms of colorectal cancer. Talk to your caregiver about this at age 50, when routine screening begins. Direct examination of the colon should be repeated every 5 to 10 years through age 75, unless early forms of pre-cancerous polyps or small growths are found.  Hepatitis C blood testing is recommended for all people born from 1945 through 1965 and any individual with known risks for hepatitis C.  Practice  safe sex. Use condoms and avoid high-risk sexual practices to reduce the spread of sexually transmitted infections (STIs). STIs include gonorrhea, chlamydia, syphilis, trichomonas, herpes, HPV, and human immunodeficiency virus (HIV). Herpes, HIV, and HPV are viral illnesses that have no cure. They can result in disability, cancer, and death. Sexually active women aged 25 and younger should be checked for chlamydia. Older women with new or multiple partners should also be tested for chlamydia. Testing for other STIs is recommended if you are sexually active and at increased risk.  Osteoporosis is a disease in which the bones lose minerals and strength with aging. This can result in serious bone fractures. The risk of osteoporosis can be identified using a bone density scan. Women ages 65 and over and women at risk for fractures or osteoporosis should discuss screening with their caregivers. Ask your caregiver whether you should take a calcium supplement or vitamin D to reduce the rate of osteoporosis.  Menopause can be associated with physical symptoms and risks. Hormone replacement therapy is available to decrease symptoms and risks. You should talk to your caregiver about whether hormone replacement therapy is right for you.  Use sunscreen with sun protection factor (SPF) of 30 or more. Apply sunscreen liberally and repeatedly throughout the day. You should seek shade when your shadow is shorter than you. Protect yourself by wearing long sleeves, pants, a wide-brimmed hat, and sunglasses year round, whenever you are outdoors.  Once a month, do a whole body skin exam, using a mirror to look at the skin on your back. Notify your caregiver of new moles, moles that have irregular borders, moles that are larger than a pencil eraser, or moles that have changed in shape or color.  Stay current with required immunizations.  Influenza. You need a dose every fall (or winter). The composition of the flu vaccine  changes each year, so being vaccinated once is not enough.  Pneumococcal polysaccharide. You need 1 to 2 doses if you smoke cigarettes or if you have certain chronic medical conditions. You need 1 dose at age 65 (or older) if you have never been vaccinated.  Tetanus, diphtheria, pertussis (Tdap, Td). Get 1 dose of Tdap vaccine if you are younger than age 65, are over 65 and have contact with an infant, are a healthcare worker, are pregnant, or simply want to be protected from whooping cough. After that, you need a Td   booster dose every 10 years. Consult your caregiver if you have not had at least 3 tetanus and diphtheria-containing shots sometime in your life or have a deep or dirty wound.  HPV. You need this vaccine if you are a woman age 26 or younger. The vaccine is given in 3 doses over 6 months.  Measles, mumps, rubella (MMR). You need at least 1 dose of MMR if you were born in 1957 or later. You may also need a second dose.  Meningococcal. If you are age 19 to 21 and a first-year college student living in a residence hall, or have one of several medical conditions, you need to get vaccinated against meningococcal disease. You may also need additional booster doses.  Zoster (shingles). If you are age 60 or older, you should get this vaccine.  Varicella (chickenpox). If you have never had chickenpox or you were vaccinated but received only 1 dose, talk to your caregiver to find out if you need this vaccine.  Hepatitis A. You need this vaccine if you have a specific risk factor for hepatitis A virus infection or you simply wish to be protected from this disease. The vaccine is usually given as 2 doses, 6 to 18 months apart.  Hepatitis B. You need this vaccine if you have a specific risk factor for hepatitis B virus infection or you simply wish to be protected from this disease. The vaccine is given in 3 doses, usually over 6 months. Preventive Services / Frequency Ages 19 to 39  Blood  pressure check.** / Every 1 to 2 years.  Lipid and cholesterol check.** / Every 5 years beginning at age 20.  Clinical breast exam.** / Every 3 years for women in their 20s and 30s.  Pap test.** / Every 2 years from ages 21 through 29. Every 3 years starting at age 30 through age 65 or 70 with a history of 3 consecutive normal Pap tests.  HPV screening.** / Every 3 years from ages 30 through ages 65 to 70 with a history of 3 consecutive normal Pap tests.  Hepatitis C blood test.** / For any individual with known risks for hepatitis C.  Skin self-exam. / Monthly.  Influenza immunization.** / Every year.  Pneumococcal polysaccharide immunization.** / 1 to 2 doses if you smoke cigarettes or if you have certain chronic medical conditions.  Tetanus, diphtheria, pertussis (Tdap, Td) immunization. / A one-time dose of Tdap vaccine. After that, you need a Td booster dose every 10 years.  HPV immunization. / 3 doses over 6 months, if you are 26 and younger.  Measles, mumps, rubella (MMR) immunization. / You need at least 1 dose of MMR if you were born in 1957 or later. You may also need a second dose.  Meningococcal immunization. / 1 dose if you are age 19 to 21 and a first-year college student living in a residence hall, or have one of several medical conditions, you need to get vaccinated against meningococcal disease. You may also need additional booster doses.  Varicella immunization.** / Consult your caregiver.  Hepatitis A immunization.** / Consult your caregiver. 2 doses, 6 to 18 months apart.  Hepatitis B immunization.** / Consult your caregiver. 3 doses usually over 6 months. Ages 40 to 64  Blood pressure check.** / Every 1 to 2 years.  Lipid and cholesterol check.** / Every 5 years beginning at age 20.  Clinical breast exam.** / Every year after age 40.  Mammogram.** / Every year beginning at age 40   and continuing for as long as you are in good health. Consult with your  caregiver.  Pap test.** / Every 3 years starting at age 30 through age 65 or 70 with a history of 3 consecutive normal Pap tests.  HPV screening.** / Every 3 years from ages 30 through ages 65 to 70 with a history of 3 consecutive normal Pap tests.  Fecal occult blood test (FOBT) of stool. / Every year beginning at age 50 and continuing until age 75. You may not need to do this test if you get a colonoscopy every 10 years.  Flexible sigmoidoscopy or colonoscopy.** / Every 5 years for a flexible sigmoidoscopy or every 10 years for a colonoscopy beginning at age 50 and continuing until age 75.  Hepatitis C blood test.** / For all people born from 1945 through 1965 and any individual with known risks for hepatitis C.  Skin self-exam. / Monthly.  Influenza immunization.** / Every year.  Pneumococcal polysaccharide immunization.** / 1 to 2 doses if you smoke cigarettes or if you have certain chronic medical conditions.  Tetanus, diphtheria, pertussis (Tdap, Td) immunization.** / A one-time dose of Tdap vaccine. After that, you need a Td booster dose every 10 years.  Measles, mumps, rubella (MMR) immunization. / You need at least 1 dose of MMR if you were born in 1957 or later. You may also need a second dose.  Varicella immunization.** / Consult your caregiver.  Meningococcal immunization.** / Consult your caregiver.  Hepatitis A immunization.** / Consult your caregiver. 2 doses, 6 to 18 months apart.  Hepatitis B immunization.** / Consult your caregiver. 3 doses, usually over 6 months. Ages 65 and over  Blood pressure check.** / Every 1 to 2 years.  Lipid and cholesterol check.** / Every 5 years beginning at age 20.  Clinical breast exam.** / Every year after age 40.  Mammogram.** / Every year beginning at age 40 and continuing for as long as you are in good health. Consult with your caregiver.  Pap test.** / Every 3 years starting at age 30 through age 65 or 70 with a 3  consecutive normal Pap tests. Testing can be stopped between 65 and 70 with 3 consecutive normal Pap tests and no abnormal Pap or HPV tests in the past 10 years.  HPV screening.** / Every 3 years from ages 30 through ages 65 or 70 with a history of 3 consecutive normal Pap tests. Testing can be stopped between 65 and 70 with 3 consecutive normal Pap tests and no abnormal Pap or HPV tests in the past 10 years.  Fecal occult blood test (FOBT) of stool. / Every year beginning at age 50 and continuing until age 75. You may not need to do this test if you get a colonoscopy every 10 years.  Flexible sigmoidoscopy or colonoscopy.** / Every 5 years for a flexible sigmoidoscopy or every 10 years for a colonoscopy beginning at age 50 and continuing until age 75.  Hepatitis C blood test.** / For all people born from 1945 through 1965 and any individual with known risks for hepatitis C.  Osteoporosis screening.** / A one-time screening for women ages 65 and over and women at risk for fractures or osteoporosis.  Skin self-exam. / Monthly.  Influenza immunization.** / Every year.  Pneumococcal polysaccharide immunization.** / 1 dose at age 65 (or older) if you have never been vaccinated.  Tetanus, diphtheria, pertussis (Tdap, Td) immunization. / A one-time dose of Tdap vaccine if you are over   65 and have contact with an infant, are a healthcare worker, or simply want to be protected from whooping cough. After that, you need a Td booster dose every 10 years.  Varicella immunization.** / Consult your caregiver.  Meningococcal immunization.** / Consult your caregiver.  Hepatitis A immunization.** / Consult your caregiver. 2 doses, 6 to 18 months apart.  Hepatitis B immunization.** / Check with your caregiver. 3 doses, usually over 6 months. ** Family history and personal history of risk and conditions may change your caregiver's recommendations. Document Released: 02/28/2001 Document Revised: 03/27/2011  Document Reviewed: 05/30/2010 [employer] Patient Information 2013 [employer].  

## 2012-05-03 NOTE — Assessment & Plan Note (Signed)
See [place] Refer to ENT

## 2012-05-03 NOTE — Assessment & Plan Note (Signed)
See labs Reviewed with pt Cont meds

## 2012-05-03 NOTE — Addendum Note (Signed)
Addended by: Jackson Latino on: 05/03/2012 09:48 AM   Modules accepted: Orders

## 2012-05-03 NOTE — Assessment & Plan Note (Addendum)
See labs  con't meds Check 24h urine

## 2012-05-03 NOTE — Progress Notes (Signed)
Subjective:     Megan [name] is a 57 y.o. female and is here for a comprehensive physical exam. The patient reports no problems.  History   Social History  . Marital Status: Divorced    Spouse Name: N/A    Number of Children: N/A  . Years of Education: N/A   Occupational History  . Not on file.   Social History Main Topics  . Smoking status: Former Smoker -- 1.00 packs/day for 20 years    Types: Cigarettes    Quit date: 01/05/1996  . Smokeless tobacco: Never Used  . Alcohol Use: 1.8 oz/week    2 Glasses of wine, 1 Cans of beer per week  . Drug Use: No  . Sexually Active: Not on file   Other Topics Concern  . Not on file   Social History Narrative  . No narrative on file   Health Maintenance  Topic Date Due  . Influenza Vaccine  09/16/2012  . Mammogram  11/16/2013  . Pap Smear  11/17/2014  . Colonoscopy  11/18/2019  . Tetanus/tdap  05/04/2022    The following portions of the patient's history were reviewed and updated as appropriate:  She  has a past medical history of Hypertension; Diabetes mellitus; Allergy; Hyperlipidemia; Infectious colitis; Diverticulosis; Colon polyp; Hemorrhoids; Ventral hernia; GERD (gastroesophageal reflux disease); H/O hiatal hernia; and Arthritis. She  does not have any pertinent problems on file. She  has past surgical history that includes Inguinal hernia repair; Hemorrhoid surgery; Ventral hernia repair; Foot surgery; colonscopy; Diagnostic laparoscopy; Incisional hernia repair (01/20/2011); and Hernia repair (01/20/2011). Her family history includes Breast cancer in her mother; Cancer in her mother and unspecified family member; Diabetes in her brother, mother, and unspecified family member; Heart disease in her father; and Throat cancer in her maternal aunt. She  reports that she quit smoking about 16 years ago. Her smoking use included Cigarettes. She has a 20 pack-year smoking history. She has never used smokeless tobacco. She  reports that she drinks about 1.8 ounces of alcohol per week. She reports that she does not use illicit drugs. She has a current medication list which includes the following prescription(s): albuterol, amlodipine, beclomethasone, biotin, freestyle insulinx test, glucose blood, kombiglyze xr, freestyle, multivitamin, and vitamin b-12. Current Outpatient Prescriptions on File Prior to Visit  Medication Sig Dispense Refill  . albuterol (PROAIR HFA) 108 (90 BASE) MCG/ACT inhaler Inhale 2 puffs into the lungs every 6 (six) hours as needed for wheezing.      Marland Kitchen amLODipine (NORVASC) 5 MG tablet TAKE 1 TABLET BY MOUTH EVERY DAY  90 tablet  0  . beclomethasone (QVAR) 40 MCG/ACT inhaler Inhale 2 puffs into the lungs 2 (two) times daily.  1 Inhaler  12  . Biotin 1000 MCG tablet Take 1,000 mcg by mouth 3 (three) times daily.      Marland Kitchen FREESTYLE INSULINX TEST test strip Check blood sugar daily  100 each  12  . glucose blood (FREESTYLE LITE) test strip Use as instructed  100 each  12  . KOMBIGLYZE XR 05-998 MG TB24 TAKE ONE TABLET BY MOUTH EVERY DAY  30 tablet  4  . Lancets (FREESTYLE) lancets Use as instructed  100 each  12  . Multiple Vitamin (MULTIVITAMIN) tablet Take 1 tablet by mouth daily.       . vitamin B-12 (CYANOCOBALAMIN) 500 MCG tablet Take 1,000 mcg by mouth daily.        No current facility-administered medications on file prior to  visit.   She is allergic to aspirin..  Review of Systems Review of Systems  Constitutional: Negative for activity change, appetite change and fatigue.  HENT: Negative for hearing loss, congestion, tinnitus and ear discharge.  dentist q58m Eyes: Negative for visual disturbance (see optho q1y -- vision corrected to 20/20 with glasses).  Respiratory: Negative for cough, chest tightness and shortness of breath.   Cardiovascular: Negative for chest pain, palpitations and leg swelling.  Gastrointestinal: Negative for abdominal pain, diarrhea, constipation and abdominal  distention.  Genitourinary: Negative for urgency, frequency, decreased urine volume and difficulty urinating.  Musculoskeletal: Negative for back pain, arthralgias and gait problem.  Skin: Negative for color change, pallor and rash.  Neurological: Negative for dizziness, light-headedness, numbness and headaches.  Hematological: Negative for adenopathy. Does not bruise/bleed easily.  Psychiatric/Behavioral: Negative for suicidal ideas, confusion, sleep disturbance, self-injury, dysphoric mood, decreased concentration and agitation.      Objective:    BP 128/70  Pulse 87  Temp(Src) 98.2 F (36.8 C) (Oral)  Resp 16  Ht 5' 5.25" (1.657 m)  Wt 192 lb 4 oz (87.204 kg)  BMI 31.76 kg/m2  SpO2 97% General appearance: alert, cooperative, appears stated age and no distress Head: Normocephalic, without obvious abnormality, atraumatic Eyes: conjunctivae/corneas clear. PERRL, EOM's intact. Fundi benign. Ears: normal TM's and external ear canals both ears Nose: Nares normal. Septum midline. Mucosa normal. No drainage or sinus tenderness. Throat: lips, mucosa, and tongue normal; teeth and gums normal Neck: no adenopathy, supple, symmetrical, trachea midline and thyroid: enlarged Back: symmetric, no curvature. ROM normal. No CVA tenderness. Lungs: clear to auscultation bilaterally Breasts: gyn Heart: regular rate and rhythm, S1, S2 normal, no murmur, click, rub or gallop Abdomen: soft, non-tender; bowel sounds normal; no masses,  no organomegaly Pelvic: deferred-gyn Extremities: extremities normal, atraumatic, no cyanosis or edema Pulses: 2+ and symmetric Skin: Skin color, texture, turgor normal. No rashes or lesions Lymph nodes: Cervical, supraclavicular, and axillary nodes normal. Neurologic: Alert and oriented X 3, normal strength and tone. Normal symmetric reflexes. Normal coordination and gait    Assessment:    Healthy female exam.      Plan:  ghm utd Check labs   See After Visit  Summary for Counseling Recommendations

## 2012-05-14 ENCOUNTER — Other Ambulatory Visit: Payer: Self-pay | Admitting: Otolaryngology

## 2012-05-14 DIAGNOSIS — E041 Nontoxic single thyroid nodule: Secondary | ICD-10-CM

## 2012-05-16 ENCOUNTER — Ambulatory Visit
Admission: RE | Admit: 2012-05-16 | Discharge: 2012-05-16 | Disposition: A | Discharge status: Home / Self Care | Payer: 59 | Source: Ambulatory Visit | Attending: Otolaryngology | Admitting: Otolaryngology

## 2012-05-16 ENCOUNTER — Other Ambulatory Visit (HOSPITAL_COMMUNITY)
Admission: RE | Admit: 2012-05-16 | Discharge: 2012-05-16 | Disposition: A | Discharge status: Home / Self Care | Payer: 59 | Source: Ambulatory Visit | Attending: Interventional Radiology | Admitting: Interventional Radiology

## 2012-05-16 DIAGNOSIS — E041 Nontoxic single thyroid nodule: Secondary | ICD-10-CM

## 2012-05-30 ENCOUNTER — Encounter: Payer: Self-pay | Admitting: Family Medicine

## 2012-05-30 ENCOUNTER — Ambulatory Visit (INDEPENDENT_AMBULATORY_CARE_PROVIDER_SITE_OTHER): Payer: 59 | Admitting: Family Medicine

## 2012-05-30 ENCOUNTER — Ambulatory Visit: Payer: 59 | Admitting: Family Medicine

## 2012-05-30 VITALS — BP 130/70 | HR 95 | Temp 98.3°F | Wt 197.6 lb

## 2012-05-30 DIAGNOSIS — R739 Hyperglycemia, unspecified: Secondary | ICD-10-CM

## 2012-05-30 DIAGNOSIS — R7309 Other abnormal glucose: Secondary | ICD-10-CM

## 2012-05-30 DIAGNOSIS — R252 Cramp and spasm: Secondary | ICD-10-CM

## 2012-05-30 MED ORDER — ALPRAZOLAM 0.5 MG PO TABS: ORAL_TABLET | ORAL | Status: DC

## 2012-05-30 NOTE — Patient Instructions (Signed)
Leg Cramps Leg cramps that occur during exercise can be caused by poor circulation or dehydration. However, muscle cramps that occur at rest or during the night are usually not due to any serious medical problem. Heat cramps may cause muscle spasms during hot weather.  CAUSES There is no clear cause for muscle cramps. However, dehydration may be a factor for those who do not drink enough fluids and those who exercise in the heat. Imbalances in the level of sodium, potassium, calcium or magnesium in the muscle tissue may also be a factor. Some medications, such as water pills (diuretics), may cause loss of chemicals that the body needs (like sodium and potassium) and cause muscle cramps. TREATMENT   Make sure your diet has enough fluids and essential minerals for the muscle to work normally.  Avoid strenuous exercise for several days if you have been having frequent leg cramps.  Stretch and massage the cramped muscle for several minutes.  Some medicines may be helpful in some patients with night cramps. Only take over-the-counter or prescription medicines as directed by your caregiver. SEEK IMMEDIATE MEDICAL CARE IF:   Your leg cramps become worse.  Your foot becomes cold, numb, or blue. Document Released: 02/10/2004 Document Revised: 03/27/2011 Document Reviewed: 01/28/2008 Exit[employer] Patient Information 2013 [employer].  

## 2012-05-30 NOTE — Progress Notes (Signed)
  Subjective:    Patient ID: Megan [name], female    DOB: 01-29-1955, 57 y.o.   MRN: [id number]  HPI Pt here c/o pain in back of knees with getting up from sitting and it con't to hurt until she starts walking for a little while then it goes away.  No cp, no sob, no calf pain.   Review of Systems As above    Objective:   Physical Exam  BP 130/70  Pulse 95  Temp(Src) 98.3 F (36.8 C) (Oral)  Wt 197 lb 9.6 oz (89.631 kg)  BMI 32.64 kg/m2  SpO2 97% General appearance: alert, cooperative, appears stated age and no distress Lungs: clear to auscultation bilaterally Breasts: normal appearance, no masses or tenderness Extremities: extremities normal, atraumatic, no cyanosis or edema, Homans sign is negative, no sign of DVT, no edema, redness or tenderness in the calves or thighs, no ulcers, gangrene or trophic changes and some spider veins      Assessment & Plan:  Leg pain---  Pt states it feels better with bananas etc                     Check labs                    Wear support hose                   Consider sports med if needed                 Pt given list of high K foods

## 2012-05-31 LAB — BASIC METABOLIC PANEL WITH GFR
BUN: 12 mg/dL (ref 6–23)
CO2: 29 meq/L (ref 19–32)
Calcium: 9.3 mg/dL (ref 8.4–10.5)
Chloride: 104 meq/L (ref 96–112)
Creatinine, Ser: 0.8 mg/dL (ref 0.4–1.2)
GFR: 101.65 mL/min
Glucose, Bld: 115 mg/dL — ABNORMAL HIGH (ref 70–99)
Potassium: 3.9 meq/L (ref 3.5–5.1)
Sodium: 140 meq/L (ref 135–145)

## 2012-05-31 LAB — MAGNESIUM: Magnesium: 2 mg/dL (ref 1.5–2.5)

## 2012-06-04 ENCOUNTER — Telehealth: Payer: Self-pay | Admitting: Family Medicine

## 2012-06-04 NOTE — Telephone Encounter (Signed)
Pt called and stated she hasn't heard about her lab results and she needs the antibiotic for [place] (she is leaving in a few days),pls call patient

## 2012-06-04 NOTE — Telephone Encounter (Signed)
i will result them as soon as I can

## 2012-06-04 NOTE — Telephone Encounter (Signed)
Labs are back but have not been resulted on. Please advise.

## 2012-06-05 MED ORDER — CIPROFLOXACIN HCL 500 MG PO TABS: 500.0000 mg | ORAL_TABLET | Freq: Two times a day (BID) | ORAL | Status: DC

## 2012-06-05 MED ORDER — CANAGLIFLOZIN 100 MG PO TABS: 1.0000 | ORAL_TABLET | Freq: Every day | ORAL | Status: DC

## 2012-06-05 NOTE — Telephone Encounter (Signed)
Notes Recorded by Lelon Perla, DO on 06/04/2012 at 5:14 PM cipro 500 mg 1 po bid for 5 days Notes Recorded by Arnette Norris, CMA on 06/04/2012 at 5:02 PM Spoke with patient and she wants to start the medication when she gets back from [place], and would also like an Abx for the trip to [place]. Send to [facility] on [address]. Please advise KP Notes Recorded by Lelon Perla, DO on 06/04/2012 at 4:58 PM DM not controlled. con't kombiglyze----add invokana 100mg  before first meal #30 2 refills--- we can give samples Recheck 3 months ---lipid, hep, bmp, [id number]

## 2012-07-09 ENCOUNTER — Telehealth: Payer: Self-pay | Admitting: Family Medicine

## 2012-07-09 MED ORDER — FLUCONAZOLE 150 MG PO TABS: ORAL_TABLET | ORAL | Status: DC

## 2012-07-09 NOTE — Telephone Encounter (Signed)
VM left advising Rx faxed to the pharmacy.     KP

## 2012-07-09 NOTE — Telephone Encounter (Signed)
Patient Information:  Caller Name: Kao  Phone: [phone]  Patient: Megan [name], Megan [name]  Gender: Female  DOB: May 23, 1955  Age: 57 Years  PCP: Lelon Perla.  Office Follow Up:  Does the office need to follow up with this patient?: No  Instructions For The Office: N/A  RN Note:  LMP/menopausal. Due to home remodeling, has been bathing instead of taking showers.  Informed MD orders require to be seen before treatment.  Offered and declined to schedule appointment now due to cost/insurance reasons.  Stated may try otc anti-yeast vaginal cream or call GYN office.  Symptoms  Reason For Call & Symptoms: Called to request oral Rx for yeast infection  due to vaginal itching and creamy discharge.  FBS not tested today; was 120 07/06/12.  Reviewed Health History In EMR: Yes  Reviewed Medications In EMR: Yes  Reviewed Allergies In EMR: Yes  Reviewed Surgeries / Procedures: Yes  Date of Onset of Symptoms: 07/07/2012  Treatments Tried: douche  Treatments Tried Worked: No  Guideline(s) Used:  Vaginal Discharge  Disposition Per Guideline:   See Within 3 Days in Office  Reason For Disposition Reached:   Symptoms of a yeast infection" (i.e., itchy, white discharge, not bad smelling) and not improved > 3 days following Care Advice  Advice Given:  Antifungal Medication for Vaginal Yeast Infection:   Available in the [place] States: Femstat-3, miconazole (Monistat-3), clotrimazole (Gyne-Lotrimin-3, Mycelex-7), butoconazole (Femstat-3).  Do not use yeast medication during the 24 hours prior to a physician appointment (Reason: interferes with examination).  Genital Hygiene:   Keep your genital area clean. Wash daily.  Keep your genital area dry. Wear cotton underwear or underwear with a cotton crotch.  Do not douche.  Do not use feminine hygiene products.  Patient Refused Recommendation:  Patient Will Follow Up With Office Later  Plans to try otc meds 1st or call GYN for Rx.

## 2012-07-09 NOTE — Telephone Encounter (Signed)
Last seen 05/30/12 but now has a yeast infection and requesting oral pill. Please advise     KP

## 2012-07-09 NOTE — Telephone Encounter (Signed)
Diflucan 150 mg 1 po qd x1, may repeat in 3 days prn ---#2 

## 2012-07-20 ENCOUNTER — Other Ambulatory Visit: Payer: Self-pay | Admitting: Family Medicine

## 2012-07-23 ENCOUNTER — Other Ambulatory Visit: Payer: Self-pay | Admitting: Family Medicine

## 2012-07-30 ENCOUNTER — Encounter: Payer: Self-pay | Admitting: Lab

## 2012-07-31 ENCOUNTER — Ambulatory Visit (HOSPITAL_BASED_OUTPATIENT_CLINIC_OR_DEPARTMENT_OTHER)
Admission: RE | Admit: 2012-07-31 | Discharge: 2012-07-31 | Disposition: A | Discharge status: Home / Self Care | Payer: 59 | Source: Ambulatory Visit | Attending: Family Medicine | Admitting: Family Medicine

## 2012-07-31 ENCOUNTER — Telehealth: Payer: Self-pay

## 2012-07-31 ENCOUNTER — Encounter: Payer: Self-pay | Admitting: Family Medicine

## 2012-07-31 ENCOUNTER — Ambulatory Visit (INDEPENDENT_AMBULATORY_CARE_PROVIDER_SITE_OTHER): Payer: 59 | Admitting: Family Medicine

## 2012-07-31 VITALS — BP 120/70 | HR 86 | Temp 98.5°F | Wt 191.0 lb

## 2012-07-31 DIAGNOSIS — M79642 Pain in left hand: Secondary | ICD-10-CM

## 2012-07-31 DIAGNOSIS — M79609 Pain in unspecified limb: Secondary | ICD-10-CM | POA: Insufficient documentation

## 2012-07-31 DIAGNOSIS — M79604 Pain in right leg: Secondary | ICD-10-CM

## 2012-07-31 DIAGNOSIS — X58XXXA Exposure to other specified factors, initial encounter: Secondary | ICD-10-CM | POA: Insufficient documentation

## 2012-07-31 DIAGNOSIS — S6990XA Unspecified injury of unspecified wrist, hand and finger(s), initial encounter: Secondary | ICD-10-CM | POA: Insufficient documentation

## 2012-07-31 MED ORDER — MELOXICAM 15 MG PO TABS: ORAL_TABLET | ORAL | Status: DC

## 2012-07-31 NOTE — Telephone Encounter (Signed)
spoke with patient and she voiced understanding. She said her has has been that way for a month and she would like the referral.      KP

## 2012-07-31 NOTE — Progress Notes (Signed)
  Subjective:    Patient ID: Megan [name], female    DOB: 1955/02/05, 57 y.o.   MRN: [id number]  HPI Pt here c/o L hand pain.  She fell on her hands with fingers bent.  L hand has been swollen and painful since. Pt still also c/o leg pain b/l low ext.  She states it feels ok when she is sitting but as soon as she gets up the legs feel like there is a pulling sensation in calfs and as she walks the pain subsides.   nsaids help.    Review of Systems    as above Objective:   Physical Exam BP 120/70  Pulse 86  Temp(Src) 98.5 F (36.9 C) (Oral)  Wt 191 lb (86.637 kg)  BMI 31.55 kg/m2  SpO2 98% General appearance: alert, cooperative, appears stated age and no distress Extremities: extremities normal, atraumatic, no cyanosis or edema Neurologic: Alert and oriented X 3, normal strength and tone. Normal symmetric reflexes. Normal coordination and gait- no calf pain with palpations                  L hand--- tenderness in fingers with mild swelling        Assessment & Plan:  1.  L hand pain-- s/p fall, check xray                  mobic 15mg  1/2-1 po qd prn                   Consider ortho if no improvement  2.  B/l leg pain---- ? Muscular etiology                            mobic                              Consider ortho or sports med if no relief

## 2012-07-31 NOTE — Telephone Encounter (Signed)
Message copied by Arnette Norris on Wed Jul 31, 2012  3:30 PM ------      Message from: Lelon Perla      Created: Wed Jul 31, 2012  9:30 AM       Hand is normal ---- mobic sent to pharmacy.  If symptoms do not improve refer to ortho / sports med ------

## 2012-09-26 ENCOUNTER — Other Ambulatory Visit: Payer: Self-pay | Admitting: *Deleted

## 2012-09-26 MED ORDER — CANAGLIFLOZIN 100 MG PO TABS: 1.0000 | ORAL_TABLET | Freq: Every day | ORAL | Status: DC

## 2012-09-26 NOTE — Telephone Encounter (Signed)
Rx was refilled for invokana.  Ag cma

## 2012-09-27 ENCOUNTER — Telehealth: Payer: Self-pay | Admitting: Family Medicine

## 2012-09-27 DIAGNOSIS — M79642 Pain in left hand: Secondary | ICD-10-CM

## 2012-09-27 DIAGNOSIS — M79605 Pain in left leg: Secondary | ICD-10-CM

## 2012-09-27 NOTE — Telephone Encounter (Signed)
Patient called and stated they have been see multiple times about leg pain and wanted to see can they get a referral to a specialist. thanks

## 2012-09-27 NOTE — Telephone Encounter (Signed)
Pt was seen July for hand and leg pain. Please advise if ok to place referral?

## 2012-09-27 NOTE — Telephone Encounter (Signed)
Refer to sports med 

## 2012-09-30 ENCOUNTER — Other Ambulatory Visit: Payer: Self-pay | Admitting: *Deleted

## 2012-09-30 MED ORDER — SAXAGLIPTIN-METFORMIN ER 5-1000 MG PO TB24: ORAL_TABLET | ORAL | Status: DC

## 2012-09-30 NOTE — Telephone Encounter (Signed)
Referral placed.

## 2012-09-30 NOTE — Telephone Encounter (Signed)
Rx was refilled for kombiglyze xr.  Ag cma

## 2012-10-02 ENCOUNTER — Ambulatory Visit (INDEPENDENT_AMBULATORY_CARE_PROVIDER_SITE_OTHER): Payer: 59 | Admitting: Family Medicine

## 2012-10-02 ENCOUNTER — Encounter: Payer: Self-pay | Admitting: Family Medicine

## 2012-10-02 VITALS — BP 130/86 | HR 76 | Wt 190.0 lb

## 2012-10-02 DIAGNOSIS — S86112A Strain of other muscle(s) and tendon(s) of posterior muscle group at lower leg level, left leg, initial encounter: Secondary | ICD-10-CM

## 2012-10-02 DIAGNOSIS — S86119A Strain of other muscle(s) and tendon(s) of posterior muscle group at lower leg level, unspecified leg, initial encounter: Secondary | ICD-10-CM | POA: Insufficient documentation

## 2012-10-02 DIAGNOSIS — S838X9A Sprain of other specified parts of unspecified knee, initial encounter: Secondary | ICD-10-CM

## 2012-10-02 DIAGNOSIS — S63509A Unspecified sprain of unspecified wrist, initial encounter: Secondary | ICD-10-CM

## 2012-10-02 DIAGNOSIS — S63502A Unspecified sprain of left wrist, initial encounter: Secondary | ICD-10-CM

## 2012-10-02 DIAGNOSIS — M25569 Pain in unspecified knee: Secondary | ICD-10-CM

## 2012-10-02 DIAGNOSIS — M25562 Pain in left knee: Secondary | ICD-10-CM

## 2012-10-02 MED ORDER — MELOXICAM 15 MG PO TABS: 15.0000 mg | ORAL_TABLET | Freq: Every day | ORAL | Status: DC

## 2012-10-02 NOTE — Assessment & Plan Note (Signed)
Patient actually seems to be improving.  Patient will do home exercises was given to her today. At followup she continues to have pain we will look at it in greater detail.

## 2012-10-02 NOTE — Progress Notes (Signed)
I'm seeing this patient by the request  of:  Dr. Laury Axon  CC: Hand pain and left knee pain  HPI: Patient is a very pleasant 57 year old female coming in with 2 different problems. Patient did have a fall back in July when she caught herself with her left hand. Patient has had an x-ray as well as MRI of her hand which has been unremarkable. Patient states that she still has some discomfort from time to time. Patient states it is more of a dull aching sensation that hurts when she moves her fingers. Patient states it hurts more on the dorsal aspect of the hand. Patient denies any swelling, any numbness and tingling. Patient states that she also has full strength still. Patient denies any fevers or chills or any abnormal weight loss. Patient has tried some icing from time to time that has been helpful. Patient is a severity a 4/10. This is affecting her regular activities of daily living and is not waking her up at night. In addition to this patient is having left knee pain. Patient states she does not remember any true injury. Patient states that the pain is mostly in the posterior medial aspect of the knee. Patient denies any clicking, popping, or giving out on her. Patient states though that it seems to be sore after sitting a long amount of time. Patient states it gets better with activity. Patient describes pain is more of a dull aching sensation and denies any numbness, any radiation of pain. Patient also that the side of her leg seems to be a lot more tight compared to the contralateral side. Once in patient does not remember any true injury on this side. Patient is a severity at a 7/10.  Past medical, surgical, family and social history reviewed. Medications reviewed all in the electronic medical record.   Review of Systems: No headache, visual changes, nausea, vomiting, diarrhea, constipation, dizziness, abdominal pain, skin rash, fevers, chills, night sweats, weight loss, swollen lymph nodes, body  aches, joint swelling, muscle aches, chest pain, shortness of breath, mood changes.   Objective:    Blood pressure 130/86, pulse 76, weight 190 lb (86.183 kg), SpO2 97.00%.   General: No apparent distress alert and oriented x3 mood and affect normal, dressed appropriately.  HEENT: Pupils equal, extraocular movements intact Respiratory: Patient's speak in full sentences and does not appear short of breath Cardiovascular: No lower extremity edema, non tender, no erythema Skin: Warm dry intact with no signs of infection or rash on extremities or on axial skeleton. Abdomen: Soft nontender Neuro: Cranial nerves II through XII are intact, neurovascularly intact in all extremities with 2+ DTRs and 2+ pulses. Lymph: No lymphadenopathy of posterior or anterior cervical chain or axillae bilaterally.  Gait normal with good balance and coordination.  MSK: Non tender with full range of motion and good stability and symmetric strength and tone of shoulders, elbows, hip, knee and ankles bilaterally.  Wrist:left  Inspection normal with no visible erythema or swelling. ROM smooth and normal with good flexion and extension and ulnar/radial deviation that is symmetrical with opposite wrist. Palpation is normal over metacarpals, navicular, lunate, and TFCC; tendons without tenderness/ swelling No snuffbox tenderness. No tenderness over Canal of Guyon. Strength 5/5 in all directions without pain. Negative Finkelstein, tinel's and phalens. Negative Watson's test. Knee: left Normal to inspection with no erythema or effusion or obvious bony abnormalities. Palpation normal with no warmth, joint line tenderness, patellar tenderness, or condyle tenderness. ROM full in flexion and extension  and lower leg rotation. Ligaments with solid consistent endpoints including ACL, PCL, LCL, MCL. Negative Mcmurray's, Apley's, and Thessalonian tests. Non painful patellar compression. Patellar glide without crepitus. Patellar  and quadriceps tendons unremarkable. Hamstring and quadriceps strength is normal but significant tightness of the left hamstring compared to the contralateral side.  MSK US performed of: Left knee This study was ordered, performed, and interpreted by Terrilee Files D.O.  Knee: All structures visualized. Anteromedial, anterolateral, posteromedial, and posterolateral menisci unremarkable without tearing, fraying, effusion, or displacement. Patellar Tendon unremarkable on long and transverse views without effusion. No abnormality of prepatellar bursa. LCL and MCL unremarkable on long and transverse views. No abnormality of origin of medial or lateral head of the gastrocnemius. Patient though has an area of the proximal calf that appears to have a calcific change in the middle near the musculotendinous juncture. This likely is a healing tear. Original size appear to be approximately 3 cm in length. There is decent neovascularization occurring.  IMPRESSION:  Chronic medial gastroc head tear    Impression and Recommendations:     This case required medical decision making of moderate complexity.

## 2012-10-02 NOTE — Assessment & Plan Note (Signed)
The patient has what appears to be a chronic medial head gastrocnemius strain. Patient given home exercises as well as meloxicam to take daily for 10 days.  Patient was given a knee brace and strapped on by me today. Icing Patient will return again in 4-6 weeks. If he continued to have trouble I would consider an intra-articular knee injection for diagnostic purposes. In addition to this I consider formal physical therapy versus nitroglycerin patch.

## 2012-10-02 NOTE — Patient Instructions (Signed)
I am sorry about the brace.  When we get the brace we will give you a call.  Use it with activity and the hour afterward.  Try these exercises daily.  Focus on stretching the hamstring  Try walking on grass or track.  Ice 20 minutes 3 times a day Come back and see me again in 4-6 weeks.

## 2012-10-17 ENCOUNTER — Telehealth: Payer: Self-pay | Admitting: *Deleted

## 2012-10-17 MED ORDER — FLUCONAZOLE 150 MG PO TABS: 150.0000 mg | ORAL_TABLET | Freq: Once | ORAL | Status: DC

## 2012-10-17 NOTE — Telephone Encounter (Signed)
Diflucan 150 mg #2   1 po qd x1, may repeat in 3 days prn--- ov if no better

## 2012-10-17 NOTE — Telephone Encounter (Addendum)
msg left advising Rx has been faxed     KP

## 2012-10-17 NOTE — Telephone Encounter (Signed)
Patient would like something called into the pharmacy for a yeast infection that she believes she has. Patient has not been seen in the office since 07/31/2012. Please advise. SW, CMA

## 2012-11-06 ENCOUNTER — Encounter: Payer: Self-pay | Admitting: Family Medicine

## 2012-11-06 ENCOUNTER — Ambulatory Visit (INDEPENDENT_AMBULATORY_CARE_PROVIDER_SITE_OTHER): Payer: 59 | Admitting: Family Medicine

## 2012-11-06 VITALS — BP 134/84 | HR 96 | Wt 188.0 lb

## 2012-11-06 DIAGNOSIS — S86112A Strain of other muscle(s) and tendon(s) of posterior muscle group at lower leg level, left leg, initial encounter: Secondary | ICD-10-CM

## 2012-11-06 DIAGNOSIS — S838X9A Sprain of other specified parts of unspecified knee, initial encounter: Secondary | ICD-10-CM

## 2012-11-06 MED ORDER — MELOXICAM 15 MG PO TABS: 15.0000 mg | ORAL_TABLET | Freq: Every day | ORAL | Status: DC

## 2012-11-06 NOTE — Patient Instructions (Signed)
Wear brace with activity Keep up stretches and exercises at least 3 times a week and after walking Ice after walking would be good.  Meloxicam as needed Come back in 3-4 weeks only if not completely better

## 2012-11-06 NOTE — Progress Notes (Signed)
CC: follow up gastroc tear.   HPI: Patient is a very pleasant 57 year old female coming in wfor follow up of knee pain that turned out to be a gastroc tear.  Patient was to do meloxicam, compression and potentially a brace that was unfortunately unavailable. Patient has been doing home exercises and states she was almost completely healed approximately 2 weeks in the last week started having worsening pain again. Patient states she was doing more activity walk about 2-3 miles. Patient does not remember any true event that made the pain worse. She still states that it is better than the last time that I saw her. Patient is no longer taking the meloxicam but would like a refill to have on hand.  Past medical, surgical, family and social history reviewed. Medications reviewed all in the electronic medical record.   Review of Systems: No headache, visual changes, nausea, vomiting, diarrhea, constipation, dizziness, abdominal pain, skin rash, fevers, chills, night sweats, weight loss, swollen lymph nodes, body aches, joint swelling, muscle aches, chest pain, shortness of breath, mood changes.   Objective:    Blood pressure 134/84, pulse 96, weight 188 lb (85.276 kg), SpO2 97.00%.   General: No apparent distress alert and oriented x3 mood and affect normal, dressed appropriately.  HEENT: Pupils equal, extraocular movements intact Respiratory: Patient's speak in full sentences and does not appear short of breath Cardiovascular: No lower extremity edema, non tender, no erythema Skin: Warm dry intact with no signs of infection or rash on extremities or on axial skeleton. Abdomen: Soft nontender Neuro: Cranial nerves II through XII are intact, neurovascularly intact in all extremities with 2+ DTRs and 2+ pulses. Lymph: No lymphadenopathy of posterior or anterior cervical chain or axillae bilaterally.  Gait normal with good balance and coordination.  MSK: Non tender with full range of motion and good  stability and symmetric strength and tone of shoulders, elbows, hip, knee and ankles bilaterally.  Wrist:left  Inspection normal with no visible erythema or swelling. ROM smooth and normal with good flexion and extension and ulnar/radial deviation that is symmetrical with opposite wrist. Palpation is normal over metacarpals, navicular, lunate, and TFCC; tendons without tenderness/ swelling No snuffbox tenderness. No tenderness over Canal of Guyon. Strength 5/5 in all directions without pain. Negative Finkelstein, tinel's and phalens. Negative Watson's test. Knee: left Normal to inspection with no erythema or effusion or obvious bony abnormalities. Palpation normal with no warmth, joint line tenderness, patellar tenderness, or condyle tenderness. ROM full in flexion and extension and lower leg rotation. Ligaments with solid consistent endpoints including ACL, PCL, LCL, MCL. Negative Mcmurray's, Apley's, and Thessalonian tests. Non painful patellar compression. Patellar glide without crepitus. Patellar and quadriceps tendons unremarkable. Hamstring and quadriceps strength is normal but significant tightness of the left hamstring compared to the contralateral side Not much changed form previous exam.   MSK US performed of: Left knee This study was ordered, performed, and interpreted by Terrilee Files D.O.  Knee: All structures visualized. Anteromedial, anterolateral, posteromedial, and posterolateral menisci unremarkable without tearing, fraying, effusion, or displacement. Patellar Tendon unremarkable on long and transverse views without effusion. No abnormality of prepatellar bursa. LCL and MCL unremarkable on long and transverse views. No abnormality of origin of medial or lateral head of the gastrocnemius. Patient has had reabsorption of the calcific mass somewhat in this area. It appears patient is healing with good neovascularization in the area. Tears partly have the sizes was  previously.  IMPRESSION:  Chronic medial gastroc head tear improving slowly  Impression and Recommendations:     This case required medical decision making of moderate complexity.

## 2012-11-06 NOTE — Assessment & Plan Note (Signed)
Patient seems to be improving. Patient was given a knee brace with compression sleeve that I think will be helpful. This was fitted by me today. Ultrasound shows that she is improving very slowly. We did discuss nitroglycerin which patient declined. Patient will try to do the exercises on a regular basis and will focus on shoe selection. Patient come back again in 3-4 weeks if not completely healed.

## 2012-11-13 ENCOUNTER — Ambulatory Visit (INDEPENDENT_AMBULATORY_CARE_PROVIDER_SITE_OTHER): Payer: 59 | Admitting: Family Medicine

## 2012-11-13 ENCOUNTER — Encounter: Payer: Self-pay | Admitting: Family Medicine

## 2012-11-13 VITALS — BP 142/66 | HR 85 | Temp 98.6°F | Wt 187.0 lb

## 2012-11-13 DIAGNOSIS — J019 Acute sinusitis, unspecified: Secondary | ICD-10-CM

## 2012-11-13 MED ORDER — CEFUROXIME AXETIL 500 MG PO TABS: 500.0000 mg | ORAL_TABLET | Freq: Two times a day (BID) | ORAL | Status: AC

## 2012-11-13 MED ORDER — FLUTICASONE PROPIONATE 50 MCG/ACT NA SUSP: NASAL | Status: DC

## 2012-11-13 MED ORDER — GUAIFENESIN-CODEINE 100-10 MG/5ML PO SYRP: ORAL_SOLUTION | ORAL | Status: DC

## 2012-11-13 NOTE — Progress Notes (Signed)
  Subjective:     Megan [name] is a 57 y.o. female who presents for evaluation of sinus pain. Symptoms include: cough, facial pain, headaches, nasal congestion and sinus pressure. Onset of symptoms was 7 days ago. Symptoms have been gradually worsening since that time. Past history is significant for no history of pneumonia or bronchitis. Patient is a non-smoker.  The following portions of the patient's history were reviewed and updated as appropriate: allergies, current medications, past family history, past medical history, past social history, past surgical history and problem list.  Review of Systems Pertinent items are noted in HPI.   Objective:    BP 142/66  Pulse 85  Temp(Src) 98.6 F (37 C) (Oral)  Wt 187 lb (84.823 kg)  BMI 30.89 kg/m2  SpO2 97% General appearance: alert, cooperative, appears stated age and no distress Ears: normal TM's and external ear canals both ears Nose: green discharge, moderate congestion, turbinates red, swollen, sinus tenderness bilateral Throat: abnormal findings: pnd Neck: moderate anterior cervical adenopathy, supple, symmetrical, trachea midline and thyroid not enlarged, symmetric, no tenderness/mass/nodules Lungs: clear to auscultation bilaterally Heart: S1, S2 normal    Assessment:    Acute bacterial sinusitis.    Plan:    Nasal steroids per medication orders. Antihistamines per medication orders. Ceftin per medication orders.

## 2012-11-13 NOTE — Patient Instructions (Signed)

## 2012-12-03 ENCOUNTER — Other Ambulatory Visit: Payer: Self-pay | Admitting: Family Medicine

## 2012-12-03 ENCOUNTER — Ambulatory Visit: Payer: 59 | Admitting: Family Medicine

## 2013-01-07 ENCOUNTER — Other Ambulatory Visit: Payer: Self-pay | Admitting: Family Medicine

## 2013-01-07 NOTE — Telephone Encounter (Signed)
Med filled.  

## 2013-01-10 ENCOUNTER — Other Ambulatory Visit: Payer: Self-pay | Admitting: Family Medicine

## 2013-01-23 ENCOUNTER — Other Ambulatory Visit: Payer: Self-pay | Admitting: Family Medicine

## 2013-03-09 ENCOUNTER — Other Ambulatory Visit: Payer: Self-pay | Admitting: Family Medicine

## 2013-04-09 ENCOUNTER — Other Ambulatory Visit: Payer: Self-pay | Admitting: Family Medicine

## 2013-05-01 ENCOUNTER — Ambulatory Visit (INDEPENDENT_AMBULATORY_CARE_PROVIDER_SITE_OTHER): Payer: 59 | Admitting: Family Medicine

## 2013-05-01 ENCOUNTER — Encounter: Payer: Self-pay | Admitting: Family Medicine

## 2013-05-01 VITALS — BP 116/76 | HR 68 | Temp 98.9°F | Wt 181.0 lb

## 2013-05-01 DIAGNOSIS — J209 Acute bronchitis, unspecified: Secondary | ICD-10-CM

## 2013-05-01 MED ORDER — AZITHROMYCIN 250 MG PO TABS: ORAL_TABLET | ORAL | Status: DC

## 2013-05-01 NOTE — Patient Instructions (Signed)

## 2013-05-01 NOTE — Progress Notes (Signed)
Pre visit review using our clinic review tool, if applicable. No additional management support is needed unless otherwise documented below in the visit note. 

## 2013-05-01 NOTE — Progress Notes (Signed)
  Subjective:     Megan [name] is a 58 y.o. female here for evaluation of a cough. Onset of symptoms was 2 days ago. Symptoms have been gradually worsening since that time. The cough is nonproductive and is aggravated by infection and reclining position. Associated symptoms include: postnasal drip and shortness of breath. Patient does not have a history of asthma. Patient does not have a history of environmental allergens. Patient has not traveled recently. Patient does not have a history of smoking. Patient has not had a previous chest x-ray. Patient has not had a PPD done.  The following portions of the patient's history were reviewed and updated as appropriate: allergies, current medications, past family history, past medical history, past social history, past surgical history and problem list.  Review of Systems Pertinent items are noted in HPI.    Objective:    Oxygen saturation 96% on room air BP 116/76  Pulse 68  Temp(Src) 98.9 F (37.2 C) (Oral)  Wt 181 lb (82.101 kg)  SpO2 96% General appearance: alert, cooperative, appears stated age and no distress Ears: normal TM's and external ear canals both ears Nose: Nares normal. Septum midline. Mucosa normal. No drainage or sinus tenderness. Throat: lips, mucosa, and tongue normal; teeth and gums normal Neck: moderate anterior cervical adenopathy, supple, symmetrical, trachea midline and thyroid not enlarged, symmetric, no tenderness/mass/nodules Lungs: diminished breath sounds bilaterally Heart: S1, S2 normal Extremities: extremities normal, atraumatic, no cyanosis or edema    Assessment:    Acute Bronchitis    Plan:    Antibiotics per medication orders. Antitussives per medication orders. Avoid exposure to tobacco smoke and fumes. Call if shortness of breath worsens, blood in sputum, change in character of cough, development of fever or chills, inability to maintain nutrition and hydration. Avoid exposure to tobacco smoke  and fumes.

## 2013-05-13 ENCOUNTER — Other Ambulatory Visit: Payer: Self-pay | Admitting: Family Medicine

## 2013-06-02 ENCOUNTER — Telehealth: Payer: Self-pay | Admitting: Family Medicine

## 2013-06-02 NOTE — Telephone Encounter (Signed)
Only available spot was at [facility] and pt only wants to see Dr. Etter Sjogren.  Told pt to call back after 4pm so we can look at the next days same day apt slots.

## 2013-06-02 NOTE — Telephone Encounter (Signed)
Please offer the patient an apt.     KP 

## 2013-06-02 NOTE — Telephone Encounter (Signed)
Last seen 05/01/13. Please advise    KP

## 2013-06-02 NOTE — Telephone Encounter (Signed)
Caller name: Sharyn Lull Relation to pt: Call back number: [phone] Pharmacy:[facility] ([facility] [place]   Reason for call:  Pt states that her bronchitis is flaring up again. She wants to know if she can get something.  Please contact pt to advise.

## 2013-06-02 NOTE — Telephone Encounter (Signed)
Needs ov

## 2013-06-04 ENCOUNTER — Encounter: Payer: Self-pay | Admitting: Family Medicine

## 2013-06-04 ENCOUNTER — Ambulatory Visit (INDEPENDENT_AMBULATORY_CARE_PROVIDER_SITE_OTHER): Payer: 59 | Admitting: Family Medicine

## 2013-06-04 VITALS — BP 132/82 | HR 85 | Temp 98.5°F | Wt 182.0 lb

## 2013-06-04 DIAGNOSIS — J069 Acute upper respiratory infection, unspecified: Secondary | ICD-10-CM

## 2013-06-04 NOTE — Patient Instructions (Signed)

## 2013-06-04 NOTE — Progress Notes (Signed)
  Subjective:     SARIE STALL is a 58 y.o. female here for evaluation of a cough. Onset of symptoms was 1 day ago. Symptoms have been gradually worsening since that time. The cough is dry and is aggravated by dust, fumes, pollens and reclining position. Associated symptoms include: postnasal drip. Patient does have a history of asthma. Patient does have a history of environmental allergens. Patient has not traveled recently. Patient does not have a history of smoking. Patient has not had a previous chest x-ray. Patient has not had a PPD done.  The following portions of the patient's history were reviewed and updated as appropriate: allergies, current medications, past family history, past medical history, past social history, past surgical history and problem list.  Review of Systems Pertinent items are noted in HPI.    Objective:    Oxygen saturation 97% on room air BP 132/82  Pulse 85  Temp(Src) 98.5 F (36.9 C) (Oral)  Wt 182 lb (82.555 kg)  SpO2 97% General appearance: alert, cooperative, appears stated age and no distress Ears: normal TM's and external ear canals both ears Nose: Nares normal. Septum midline. Mucosa normal. No drainage or sinus tenderness. Throat: lips, mucosa, and tongue normal; teeth and gums normal Neck: no adenopathy, supple, symmetrical, trachea midline and thyroid not enlarged, symmetric, no tenderness/mass/nodules Lungs: clear to auscultation bilaterally Heart: S1, S2 normal Extremities: extremities normal, atraumatic, no cyanosis or edema    Assessment:    Reactive Airway Disease    Plan:    Explained lack of efficacy of antibiotics in viral disease. Antitussives per medication orders. Avoid exposure to tobacco smoke and fumes. B-agonist inhaler. Call if shortness of breath worsens, blood in sputum, change in character of cough, development of fever or chills, inability to maintain nutrition and hydration. Avoid exposure to tobacco smoke and  fumes.

## 2013-06-18 ENCOUNTER — Other Ambulatory Visit: Payer: Self-pay | Admitting: Family Medicine

## 2013-07-01 ENCOUNTER — Encounter: Payer: Self-pay | Admitting: Family Medicine

## 2013-07-01 ENCOUNTER — Ambulatory Visit (INDEPENDENT_AMBULATORY_CARE_PROVIDER_SITE_OTHER): Payer: 59 | Admitting: Family Medicine

## 2013-07-01 VITALS — BP 126/80 | HR 80 | Temp 98.5°F | Ht 65.25 in | Wt 183.0 lb

## 2013-07-01 DIAGNOSIS — Z0000 Encounter for general adult medical examination without abnormal findings: Secondary | ICD-10-CM

## 2013-07-01 DIAGNOSIS — I10 Essential (primary) hypertension: Secondary | ICD-10-CM

## 2013-07-01 DIAGNOSIS — E669 Obesity, unspecified: Secondary | ICD-10-CM | POA: Insufficient documentation

## 2013-07-01 DIAGNOSIS — R8290 Unspecified abnormal findings in urine: Secondary | ICD-10-CM

## 2013-07-01 DIAGNOSIS — E041 Nontoxic single thyroid nodule: Secondary | ICD-10-CM

## 2013-07-01 DIAGNOSIS — R82998 Other abnormal findings in urine: Secondary | ICD-10-CM

## 2013-07-01 DIAGNOSIS — Z2911 Encounter for prophylactic immunotherapy for respiratory syncytial virus (RSV): Secondary | ICD-10-CM

## 2013-07-01 DIAGNOSIS — Z23 Encounter for immunization: Secondary | ICD-10-CM

## 2013-07-01 DIAGNOSIS — E1159 Type 2 diabetes mellitus with other circulatory complications: Secondary | ICD-10-CM

## 2013-07-01 LAB — CBC WITH DIFFERENTIAL/PLATELET
BASOS ABS: 0 10*3/uL (ref 0.0–0.1)
Basophils Relative: 0.4 % (ref 0.0–3.0)
EOS ABS: 0.1 10*3/uL (ref 0.0–0.7)
Eosinophils Relative: 0.9 % (ref 0.0–5.0)
HEMATOCRIT: 38.5 % (ref 36.0–46.0)
Hemoglobin: 12.1 g/dL (ref 12.0–15.0)
LYMPHS ABS: 2.9 10*3/uL (ref 0.7–4.0)
Lymphocytes Relative: 31 % (ref 12.0–46.0)
MCHC: 31.5 g/dL (ref 30.0–36.0)
MCV: 68.9 fl — ABNORMAL LOW (ref 78.0–100.0)
MONOS PCT: 6.8 % (ref 3.0–12.0)
Monocytes Absolute: 0.6 10*3/uL (ref 0.1–1.0)
Neutro Abs: 5.6 10*3/uL (ref 1.4–7.7)
Neutrophils Relative %: 60.9 % (ref 43.0–77.0)
PLATELETS: 349 10*3/uL (ref 150.0–400.0)
RBC: 5.59 Mil/uL — ABNORMAL HIGH (ref 3.87–5.11)
RDW: 15.1 % (ref 11.5–15.5)
WBC: 9.3 10*3/uL (ref 4.0–10.5)

## 2013-07-01 LAB — HEPATIC FUNCTION PANEL
ALT: 22 U/L (ref 0–35)
AST: 23 U/L (ref 0–37)
Albumin: 3.8 g/dL (ref 3.5–5.2)
Alkaline Phosphatase: 70 U/L (ref 39–117)
BILIRUBIN TOTAL: 0.8 mg/dL (ref 0.2–1.2)
Bilirubin, Direct: 0.1 mg/dL (ref 0.0–0.3)
Total Protein: 7.5 g/dL (ref 6.0–8.3)

## 2013-07-01 LAB — LIPID PANEL
CHOLESTEROL: 175 mg/dL (ref 0–200)
HDL: 50.5 mg/dL (ref >39.00)
LDL CALC: 85 mg/dL (ref 0–99)
NonHDL: 124.5
TRIGLYCERIDES: 196 mg/dL — AB (ref 0.0–149.0)
Total CHOL/HDL Ratio: 3
VLDL: 39.2 mg/dL (ref 0.0–40.0)

## 2013-07-01 LAB — BASIC METABOLIC PANEL
BUN: 12 mg/dL (ref 6–23)
CALCIUM: 9.2 mg/dL (ref 8.4–10.5)
CO2: 27 mEq/L (ref 19–32)
Chloride: 105 mEq/L (ref 96–112)
Creatinine, Ser: 0.7 mg/dL (ref 0.4–1.2)
GFR: 119.46 mL/min (ref >60.00)
GLUCOSE: 123 mg/dL — AB (ref 70–99)
POTASSIUM: 3.6 meq/L (ref 3.5–5.1)
Sodium: 139 mEq/L (ref 135–145)

## 2013-07-01 LAB — TSH: TSH: 0.69 u[IU]/mL (ref 0.35–4.50)

## 2013-07-01 LAB — HEMOGLOBIN A1C: Hgb A1c MFr Bld: 7.7 % — ABNORMAL HIGH (ref 4.6–6.5)

## 2013-07-01 NOTE — Progress Notes (Signed)
Subjective:     Megan [name] is a 58 y.o. female and is here for a comprehensive physical exam. The patient reports no problems.  History   Social History  . Marital Status: Divorced    Spouse Name: N/A    Number of Children: N/A  . Years of Education: N/A   Occupational History  . Not on file.   Social History Main Topics  . Smoking status: Former Smoker -- 1.00 packs/day for 20 years    Types: Cigarettes    Quit date: 01/05/1996  . Smokeless tobacco: Never Used  . Alcohol Use: 1.8 oz/week    2 Glasses of wine, 1 Cans of beer per week  . Drug Use: No  . Sexual Activity: Not on file   Other Topics Concern  . Not on file   Social History Narrative  . No narrative on file   Health Maintenance  Topic Date Due  . Pneumococcal Polysaccharide Vaccine (##1) 03/20/1955  . Foot Exam  03/20/1963  . Hemoglobin A1c  11/30/2012  . Zostavax  03/19/2013  . Urine Microalbumin  04/26/2013  . Influenza Vaccine  08/16/2013  . Ophthalmology Exam  10/25/2013  . Mammogram  11/16/2013  . Pap Smear  11/17/2014  . Colonoscopy  11/18/2019  . Tetanus/tdap  05/04/2022    The following portions of the patient's history were reviewed and updated as appropriate:  She  has a past medical history of Hypertension; Diabetes mellitus; Allergy; Hyperlipidemia; Infectious colitis; Diverticulosis; Colon polyp; Hemorrhoids; Ventral hernia; GERD (gastroesophageal reflux disease); H/O hiatal hernia; and Arthritis. She  does not have any pertinent problems on file. She  has past surgical history that includes Inguinal hernia repair; Hemorrhoid surgery; Ventral hernia repair; Foot surgery; colonscopy; Diagnostic laparoscopy; Incisional hernia repair (01/20/2011); and Hernia repair (01/20/2011). Her family history includes Breast cancer in her mother; Cancer in her mother, sister, and another family member; Diabetes in her brother, mother, and another family member; Heart disease in her father; Throat  cancer in her maternal aunt. She  reports that she quit smoking about 17 years ago. Her smoking use included Cigarettes. She has a 20 pack-year smoking history. She has never used smokeless tobacco. She reports that she drinks about 1.8 ounces of alcohol per week. She reports that she does not use illicit drugs. She has a current medication list which includes the following prescription(s): albuterol, alprazolam, amlodipine, biotin, fluticasone, freestyle insulinx test, glucose blood, freestyle, meloxicam, multivitamin, saxagliptin-metformin, beclomethasone, and vitamin b-12. Current Outpatient Prescriptions on File Prior to Visit  Medication Sig Dispense Refill  . albuterol (PROAIR HFA) 108 (90 BASE) MCG/ACT inhaler Inhale 2 puffs into the lungs every 6 (six) hours as needed for wheezing.      Marland Kitchen ALPRAZolam (XANAX) 0.5 MG tablet 1 po before flying  10 tablet  0  . amLODipine (NORVASC) 5 MG tablet TAKE 1 TABLET BY MOUTH EVERY DAY  90 tablet  1  . Biotin 1000 MCG tablet Take 1,000 mcg by mouth 3 (three) times daily.      . fluticasone (FLONASE) 50 MCG/ACT nasal spray 2 sprays each nostril qd  16 g  6  . FREESTYLE INSULINX TEST test strip Check blood sugar daily  100 each  12  . glucose blood (FREESTYLE LITE) test strip Use as instructed  100 each  12  . Lancets (FREESTYLE) lancets Use as instructed  100 each  12  . meloxicam (MOBIC) 15 MG tablet Take 1 tablet (15 mg total) by mouth  daily.  30 tablet  1  . Multiple Vitamin (MULTIVITAMIN) tablet Take 1 tablet by mouth daily.       . Saxagliptin-Metformin (KOMBIGLYZE XR) 05-998 MG TB24 1 tab by mouth daily--labs are due now  30 tablet  0  . beclomethasone (QVAR) 40 MCG/ACT inhaler Inhale 2 puffs into the lungs 2 (two) times daily.  1 Inhaler  12  . vitamin B-12 (CYANOCOBALAMIN) 500 MCG tablet Take 1,000 mcg by mouth daily.        No current facility-administered medications on file prior to visit.   She is allergic to aspirin..  Review of  Systems Review of Systems  Constitutional: Negative for activity change, appetite change and fatigue.  HENT: Negative for hearing loss, congestion, tinnitus and ear discharge.  dentist q70m Eyes: Negative for visual disturbance (see optho q1y -- vision corrected to 20/20 with glasses).  Respiratory: Negative for cough, chest tightness and shortness of breath.   Cardiovascular: Negative for chest pain, palpitations and leg swelling.  Gastrointestinal: Negative for abdominal pain, diarrhea, constipation and abdominal distention.  Genitourinary: Negative for urgency, frequency, decreased urine volume and difficulty urinating.  Musculoskeletal: Negative for back pain, arthralgias and gait problem.  Skin: Negative for color change, pallor and rash.  Neurological: Negative for dizziness, light-headedness, numbness and headaches.  Hematological: Negative for adenopathy. Does not bruise/bleed easily.  Psychiatric/Behavioral: Negative for suicidal ideas, confusion, sleep disturbance, self-injury, dysphoric mood, decreased concentration and agitation.       Objective:    BP 126/80  Pulse 80  Temp(Src) 98.5 F (36.9 C) (Oral)  Ht 5' 5.25" (1.657 m)  Wt 183 lb (83.008 kg)  BMI 30.23 kg/m2  SpO2 97% General appearance: alert, cooperative, appears stated age and no distress Head: Normocephalic, without obvious abnormality, atraumatic Eyes: conjunctivae/corneas clear. PERRL, EOM's intact. Fundi benign. Ears: normal TM's and external ear canals both ears Nose: Nares normal. Septum midline. Mucosa normal. No drainage or sinus tenderness. Throat: lips, mucosa, and tongue normal; teeth and gums normal Neck: no adenopathy, no carotid bruit, no JVD, supple, symmetrical, trachea midline and thyroid not enlarged, symmetric, no tenderness/mass/nodules Back: symmetric, no curvature. ROM normal. No CVA tenderness. Lungs: clear to auscultation bilaterally Breasts: gyn Heart: regular rate and rhythm, S1,  S2 normal, no murmur, click, rub or gallop Abdomen: soft, non-tender; bowel sounds normal; no masses,  no organomegaly Pelvic: deferred Extremities: extremities normal, atraumatic, no cyanosis or edema Pulses: 2+ and symmetric Skin: Skin color, texture, turgor normal. No rashes or lesions Lymph nodes: gyn Neurologic: Alert and oriented X 3, normal strength and tone. Normal symmetric reflexes. Normal coordination and gait Psych-no depression, no anxiety      Assessment:    Healthy female exam.      Plan:    ghm utd Check labs See After Visit Summary for Counseling Recommendations   1. Type II or unspecified type diabetes mellitus with peripheral circulatory disorders, uncontrolled(250.72) Check labs - Basic metabolic panel - CBC with Differential - Hepatic function panel - Lipid panel - POCT urinalysis dipstick - Hemoglobin A1c - TSH  2. HTN (hypertension) Stable, con't meds - CBC with Differential - Hepatic function panel - Lipid panel - TSH  3. Preventative health care  - TSH

## 2013-07-01 NOTE — Patient Instructions (Signed)
Preventive Care for Adults, Female A healthy lifestyle and preventive care can promote health and wellness. Preventive health guidelines for women include the following key practices.  A routine yearly physical is a good way to check with your health care Savilla Turbyfill about your health and preventive screening. It is a chance to share any concerns and updates on your health and to receive a thorough exam.  Visit your dentist for a routine exam and preventive care every 6 months. Brush your teeth twice a day and floss once a day. Good oral hygiene prevents tooth decay and gum disease.  The frequency of eye exams is based on your age, health, family medical history, use of contact lenses, and other factors. Follow your health care Tytiana Coles's recommendations for frequency of eye exams.  Eat a healthy diet. Foods like vegetables, fruits, whole grains, low-fat dairy products, and lean protein foods contain the nutrients you need without too many calories. Decrease your intake of foods high in solid fats, added sugars, and salt. Eat the right amount of calories for you.Get information about a proper diet from your health care Kenadi Miltner, if necessary.  Regular physical exercise is one of the most important things you can do for your health. Most adults should get at least 150 minutes of moderate-intensity exercise (any activity that increases your heart rate and causes you to sweat) each week. In addition, most adults need muscle-strengthening exercises on 2 or more days a week.  Maintain a healthy weight. The body mass index (BMI) is a screening tool to identify possible weight problems. It provides an estimate of body fat based on height and weight. Your health care Carnesha Maravilla can find your BMI, and can help you achieve or maintain a healthy weight.For adults 20 years and older:  A BMI below 18.5 is considered underweight.  A BMI of 18.5 to 24.9 is normal.  A BMI of 25 to 29.9 is considered overweight.  A  BMI of 30 and above is considered obese.  Maintain normal blood lipids and cholesterol levels by exercising and minimizing your intake of saturated fat. Eat a balanced diet with plenty of fruit and vegetables. Blood tests for lipids and cholesterol should begin at age 62 and be repeated every 5 years. If your lipid or cholesterol levels are high, you are over 50, or you are at high risk for heart disease, you may need your cholesterol levels checked more frequently.Ongoing high lipid and cholesterol levels should be treated with medicines if diet and exercise are not working.  If you smoke, find out from your health care Kaymon Denomme how to quit. If you do not use tobacco, do not start.  Lung cancer screening is recommended for adults aged 36 80 years who are at high risk for developing lung cancer because of a history of smoking. A yearly low-dose CT scan of the lungs is recommended for people who have at least a 30-pack-year history of smoking and are a current smoker or have quit within the past 15 years. A pack year of smoking is smoking an average of 1 pack of cigarettes a day for 1 year (for example: 1 pack a day for 30 years or 2 packs a day for 15 years). Yearly screening should continue until the smoker has stopped smoking for at least 15 years. Yearly screening should be stopped for people who develop a health problem that would prevent them from having lung cancer treatment.  If you are pregnant, do not drink alcohol. If you  are breastfeeding, be very cautious about drinking alcohol. If you are not pregnant and choose to drink alcohol, do not have more than 1 drink per day. One drink is considered to be 12 ounces (355 mL) of beer, 5 ounces (148 mL) of wine, or 1.5 ounces (44 mL) of liquor.  Avoid use of street drugs. Do not share needles with anyone. Ask for help if you need support or instructions about stopping the use of drugs.  High blood pressure causes heart disease and increases the risk  of stroke. Your blood pressure should be checked at least every 1 to 2 years. Ongoing high blood pressure should be treated with medicines if weight loss and exercise do not work.  If you are 39 60 years old, ask your health care Jarion Hawthorne if you should take aspirin to prevent strokes.  Diabetes screening involves taking a blood sample to check your fasting blood sugar level. This should be done once every 3 years, after age 56, if you are within normal weight and without risk factors for diabetes. Testing should be considered at a younger age or be carried out more frequently if you are overweight and have at least 1 risk factor for diabetes.  Breast cancer screening is essential preventive care for women. You should practice "breast self-awareness." This means understanding the normal appearance and feel of your breasts and may include breast self-examination. Any changes detected, no matter how small, should be reported to a health care Callen Vancuren. Women in their 40s and 30s should have a clinical breast exam (CBE) by a health care Kaidin Boehle as part of a regular health exam every 1 to 3 years. After age 28, women should have a CBE every year. Starting at age 72, women should consider having a mammogram (breast X-ray test) every year. Women who have a family history of breast cancer should talk to their health care Hazel Leveille about genetic screening. Women at a high risk of breast cancer should talk to their health care providers about having an MRI and a mammogram every year.  Breast cancer gene (BRCA)-related cancer risk assessment is recommended for women who have family members with BRCA-related cancers. BRCA-related cancers include breast, ovarian, tubal, and peritoneal cancers. Having family members with these cancers may be associated with an increased risk for harmful changes (mutations) in the breast cancer genes BRCA1 and BRCA2. Results of the assessment will determine the need for genetic counseling  and BRCA1 and BRCA2 testing.  The Pap test is a screening test for cervical cancer. A Pap test can show cell changes on the cervix that might become cervical cancer if left untreated. A Pap test is a procedure in which cells are obtained and examined from the lower end of the uterus (cervix).  Women should have a Pap test starting at age 59.  Between ages 42 and 13, Pap tests should be repeated every 2 years.  Beginning at age 53, you should have a Pap test every 3 years as long as the past 3 Pap tests have been normal.  Some women have medical problems that increase the chance of getting cervical cancer. Talk to your health care Kirstan Fentress about these problems. It is especially important to talk to your health care Seaira Byus if a new problem develops soon after your last Pap test. In these cases, your health care Jaimya Feliciano may recommend more frequent screening and Pap tests.  The above recommendations are the same for women who have or have not gotten the vaccine  for human papillomavirus (HPV).  If you had a hysterectomy for a problem that was not cancer or a condition that could lead to cancer, then you no longer need Pap tests. Even if you no longer need a Pap test, a regular exam is a good idea to make sure no other problems are starting.  If you are between ages 58 and 10 years, and you have had normal Pap tests going back 10 years, you no longer need Pap tests. Even if you no longer need a Pap test, a regular exam is a good idea to make sure no other problems are starting.  If you have had past treatment for cervical cancer or a condition that could lead to cancer, you need Pap tests and screening for cancer for at least 20 years after your treatment.  If Pap tests have been discontinued, risk factors (such as a new sexual partner) need to be reassessed to determine if screening should be resumed.  The HPV test is an additional test that may be used for cervical cancer screening. The HPV test  looks for the virus that can cause the cell changes on the cervix. The cells collected during the Pap test can be tested for HPV. The HPV test could be used to screen women aged 67 years and older, and should be used in women of any age who have unclear Pap test results. After the age of 65, women should have HPV testing at the same frequency as a Pap test.  Colorectal cancer can be detected and often prevented. Most routine colorectal cancer screening begins at the age of 25 years and continues through age 66 years. However, your health care provider may recommend screening at an earlier age if you have risk factors for colon cancer. On a yearly basis, your health care provider may provide home test kits to check for hidden blood in the stool. Use of a small camera at the end of a tube, to directly examine the colon (sigmoidoscopy or colonoscopy), can detect the earliest forms of colorectal cancer. Talk to your health care provider about this at age 79, when routine screening begins. Direct exam of the colon should be repeated every 5 10 years through age 47 years, unless early forms of pre-cancerous polyps or small growths are found.  People who are at an increased risk for hepatitis B should be screened for this virus. You are considered at high risk for hepatitis B if:  You were born in a country where hepatitis B occurs often. Talk with your health care provider about which countries are considered high risk.  Your parents were born in a high-risk country and you have not received a shot to protect against hepatitis B (hepatitis B vaccine).  You have HIV or AIDS.  You use needles to inject street drugs.  You live with, or have sex with, someone who has Hepatitis B.  You get hemodialysis treatment.  You take certain medicines for conditions like cancer, organ transplantation, and autoimmune conditions.  Hepatitis C blood testing is recommended for all people born from [birth year] through 1965 and  any individual with known risks for hepatitis C.  Practice safe sex. Use condoms and avoid high-risk sexual practices to reduce the spread of sexually transmitted infections (STIs). STIs include gonorrhea, chlamydia, syphilis, trichomonas, herpes, HPV, and human immunodeficiency virus (HIV). Herpes, HIV, and HPV are viral illnesses that have no cure. They can result in disability, cancer, and death. Sexually active women aged 66  years and younger should be checked for chlamydia. Older women with new or multiple partners should also be tested for chlamydia. Testing for other STIs is recommended if you are sexually active and at increased risk.  Osteoporosis is a disease in which the bones lose minerals and strength with aging. This can result in serious bone fractures or breaks. The risk of osteoporosis can be identified using a bone density scan. Women ages 80 years and over and women at risk for fractures or osteoporosis should discuss screening with their health care providers. Ask your health care Hyrum Shaneyfelt whether you should take a calcium supplement or vitamin D to reduce the rate of osteoporosis.  Menopause can be associated with physical symptoms and risks. Hormone replacement therapy is available to decrease symptoms and risks. You should talk to your health care Maliya Marich about whether hormone replacement therapy is right for you.  Use sunscreen. Apply sunscreen liberally and repeatedly throughout the day. You should seek shade when your shadow is shorter than you. Protect yourself by wearing long sleeves, pants, a wide-brimmed hat, and sunglasses year round, whenever you are outdoors.  Once a month, do a whole body skin exam, using a mirror to look at the skin on your back. Tell your health care Reha Martinovich of new moles, moles that have irregular borders, moles that are larger than a pencil eraser, or moles that have changed in shape or color.  Stay current with required vaccines  (immunizations).  Influenza vaccine. All adults should be immunized every year.  Tetanus, diphtheria, and acellular pertussis (Td, Tdap) vaccine. Pregnant women should receive 1 dose of Tdap vaccine during each pregnancy. The dose should be obtained regardless of the length of time since the last dose. Immunization is preferred during the 27th 36th week of gestation. An adult who has not previously received Tdap or who does not know her vaccine status should receive 1 dose of Tdap. This initial dose should be followed by tetanus and diphtheria toxoids (Td) booster doses every 10 years. Adults with an unknown or incomplete history of completing a 3-dose immunization series with Td-containing vaccines should begin or complete a primary immunization series including a Tdap dose. Adults should receive a Td booster every 10 years.  Varicella vaccine. An adult without evidence of immunity to varicella should receive 2 doses or a second dose if she has previously received 1 dose. Pregnant females who do not have evidence of immunity should receive the first dose after pregnancy. This first dose should be obtained before leaving the health care facility. The second dose should be obtained 4 8 weeks after the first dose.  Human papillomavirus (HPV) vaccine. Females aged 60 26 years who have not received the vaccine previously should obtain the 3-dose series. The vaccine is not recommended for use in pregnant females. However, pregnancy testing is not needed before receiving a dose. If a female is found to be pregnant after receiving a dose, no treatment is needed. In that case, the remaining doses should be delayed until after the pregnancy. Immunization is recommended for any person with an immunocompromised condition through the age of 57 years if she did not get any or all doses earlier. During the 3-dose series, the second dose should be obtained 4 8 weeks after the first dose. The third dose should be obtained  24 weeks after the first dose and 16 weeks after the second dose.  Zoster vaccine. One dose is recommended for adults aged 32 years or older unless certain  conditions are present.  Measles, mumps, and rubella (MMR) vaccine. Adults born before 20 generally are considered immune to measles and mumps. Adults born in [birth year] or later should have 1 or more doses of MMR vaccine unless there is a contraindication to the vaccine or there is laboratory evidence of immunity to each of the three diseases. A routine second dose of MMR vaccine should be obtained at least 28 days after the first dose for students attending postsecondary schools, health care workers, or international travelers. People who received inactivated measles vaccine or an unknown type of measles vaccine during 1963 1967 should receive 2 doses of MMR vaccine. People who received inactivated mumps vaccine or an unknown type of mumps vaccine before 1979 and are at high risk for mumps infection should consider immunization with 2 doses of MMR vaccine. For females of childbearing age, rubella immunity should be determined. If there is no evidence of immunity, females who are not pregnant should be vaccinated. If there is no evidence of immunity, females who are pregnant should delay immunization until after pregnancy. Unvaccinated health care workers born before [birth year] who lack laboratory evidence of measles, mumps, or rubella immunity or laboratory confirmation of disease should consider measles and mumps immunization with 2 doses of MMR vaccine or rubella immunization with 1 dose of MMR vaccine.  Pneumococcal 13-valent conjugate (PCV13) vaccine. When indicated, a person who is uncertain of her immunization history and has no record of immunization should receive the PCV13 vaccine. An adult aged 59 years or older who has certain medical conditions and has not been previously immunized should receive 1 dose of PCV13 vaccine. This PCV13 should be followed  with a dose of pneumococcal polysaccharide (PPSV23) vaccine. The PPSV23 vaccine dose should be obtained at least 8 weeks after the dose of PCV13 vaccine. An adult aged 51 years or older who has certain medical conditions and previously received 1 or more doses of PPSV23 vaccine should receive 1 dose of PCV13. The PCV13 vaccine dose should be obtained 1 or more years after the last PPSV23 vaccine dose.  Pneumococcal polysaccharide (PPSV23) vaccine. When PCV13 is also indicated, PCV13 should be obtained first. All adults aged 67 years and older should be immunized. An adult younger than age 25 years who has certain medical conditions should be immunized. Any person who resides in a nursing home or long-term care facility should be immunized. An adult smoker should be immunized. People with an immunocompromised condition and certain other conditions should receive both PCV13 and PPSV23 vaccines. People with human immunodeficiency virus (HIV) infection should be immunized as soon as possible after diagnosis. Immunization during chemotherapy or radiation therapy should be avoided. Routine use of PPSV23 vaccine is not recommended for American Indians, [place] Natives, or people younger than 65 years unless there are medical conditions that require PPSV23 vaccine. When indicated, people who have unknown immunization and have no record of immunization should receive PPSV23 vaccine. One-time revaccination 5 years after the first dose of PPSV23 is recommended for people aged 54 64 years who have chronic kidney failure, nephrotic syndrome, asplenia, or immunocompromised conditions. People who received 1 2 doses of PPSV23 before age 55 years should receive another dose of PPSV23 vaccine at age 49 years or later if at least 5 years have passed since the previous dose. Doses of PPSV23 are not needed for people immunized with PPSV23 at or after age 60 years.  Meningococcal vaccine. Adults with asplenia or persistent complement  component deficiencies should receive 2  doses of quadrivalent meningococcal conjugate (MenACWY-D) vaccine. The doses should be obtained at least 2 months apart. Microbiologists working with certain meningococcal bacteria, [place] recruits, people at risk during an outbreak, and people who travel to or live in countries with a high rate of meningitis should be immunized. A first-year college student up through age 38 years who is living in a residence hall should receive a dose if she did not receive a dose on or after her 16th birthday. Adults who have certain high-risk conditions should receive one or more doses of vaccine.  Hepatitis A vaccine. Adults who wish to be protected from this disease, have certain high-risk conditions, work with hepatitis A-infected animals, work in hepatitis A research labs, or travel to or work in countries with a high rate of hepatitis A should be immunized. Adults who were previously unvaccinated and who anticipate close contact with an international adoptee during the first 60 days after arrival in the [place] from a country with a high rate of hepatitis A should be immunized.  Hepatitis B vaccine. Adults who wish to be protected from this disease, have certain high-risk conditions, may be exposed to blood or other infectious body fluids, are household contacts or sex partners of hepatitis B positive people, are clients or workers in certain care facilities, or travel to or work in countries with a high rate of hepatitis B should be immunized.  Haemophilus influenzae type b (Hib) vaccine. A previously unvaccinated person with asplenia or sickle cell disease or having a scheduled splenectomy should receive 1 dose of Hib vaccine. Regardless of previous immunization, a recipient of a hematopoietic stem cell transplant should receive a 3-dose series 6 12 months after her successful transplant. Hib vaccine is not recommended for adults with HIV infection. Preventive  Services / Frequency Ages 75 to 39years  Blood pressure check.** / Every 1 to 2 years.  Lipid and cholesterol check.** / Every 5 years beginning at age 1.  Clinical breast exam.** / Every 3 years for women in their 13s and 110s.  BRCA-related cancer risk assessment.** / For women who have family members with a BRCA-related cancer (breast, ovarian, tubal, or peritoneal cancers).  Pap test.** / Every 2 years from ages 1 through 80. Every 3 years starting at age 58 through age 49 or 68 with a history of 3 consecutive normal Pap tests.  HPV screening.** / Every 3 years from ages 42 through ages 57 to 24 with a history of 3 consecutive normal Pap tests.  Hepatitis C blood test.** / For any individual with known risks for hepatitis C.  Skin self-exam. / Monthly.  Influenza vaccine. / Every year.  Tetanus, diphtheria, and acellular pertussis (Tdap, Td) vaccine.** / Consult your health care Katiria Calame. Pregnant women should receive 1 dose of Tdap vaccine during each pregnancy. 1 dose of Td every 10 years.  Varicella vaccine.** / Consult your health care Yacoub Diltz. Pregnant females who do not have evidence of immunity should receive the first dose after pregnancy.  HPV vaccine. / 3 doses over 6 months, if 2 and younger. The vaccine is not recommended for use in pregnant females. However, pregnancy testing is not needed before receiving a dose.  Measles, mumps, rubella (MMR) vaccine.** / You need at least 1 dose of MMR if you were born in 1957 or later. You may also need a 2nd dose. For females of childbearing age, rubella immunity should be determined. If there is no evidence of immunity, females who are not  pregnant should be vaccinated. If there is no evidence of immunity, females who are pregnant should delay immunization until after pregnancy.  Pneumococcal 13-valent conjugate (PCV13) vaccine.** / Consult your health care provider.  Pneumococcal polysaccharide (PPSV23) vaccine.** / 1 to 2  doses if you smoke cigarettes or if you have certain conditions.  Meningococcal vaccine.** / 1 dose if you are age 88 to 6 years and a Market researcher living in a residence hall, or have one of several medical conditions, you need to get vaccinated against meningococcal disease. You may also need additional booster doses.  Hepatitis A vaccine.** / Consult your health care provider.  Hepatitis B vaccine.** / Consult your health care provider.  Haemophilus influenzae type b (Hib) vaccine.** / Consult your health care provider. Ages 23 to 64years  Blood pressure check.** / Every 1 to 2 years.  Lipid and cholesterol check.** / Every 5 years beginning at age 20 years.  Lung cancer screening. / Every year if you are aged 51 80 years and have a 30-pack-year history of smoking and currently smoke or have quit within the past 15 years. Yearly screening is stopped once you have quit smoking for at least 15 years or develop a health problem that would prevent you from having lung cancer treatment.  Clinical breast exam.** / Every year after age 8 years.  BRCA-related cancer risk assessment.** / For women who have family members with a BRCA-related cancer (breast, ovarian, tubal, or peritoneal cancers).  Mammogram.** / Every year beginning at age 10 years and continuing for as long as you are in good health. Consult with your health care provider.  Pap test.** / Every 3 years starting at age 30 years through age 5 or 61 years with a history of 3 consecutive normal Pap tests.  HPV screening.** / Every 3 years from ages 39 years through ages 72 to 19 years with a history of 3 consecutive normal Pap tests.  Fecal occult blood test (FOBT) of stool. / Every year beginning at age 59 years and continuing until age 27 years. You may not need to do this test if you get a colonoscopy every 10 years.  Flexible sigmoidoscopy or colonoscopy.** / Every 5 years for a flexible sigmoidoscopy or every  10 years for a colonoscopy beginning at age 110 years and continuing until age 63 years.  Hepatitis C blood test.** / For all people born from [birth year] through 1967 and any individual with known risks for hepatitis C.  Skin self-exam. / Monthly.  Influenza vaccine. / Every year.  Tetanus, diphtheria, and acellular pertussis (Tdap/Td) vaccine.** / Consult your health care provider. Pregnant women should receive 1 dose of Tdap vaccine during each pregnancy. 1 dose of Td every 10 years.  Varicella vaccine.** / Consult your health care provider. Pregnant females who do not have evidence of immunity should receive the first dose after pregnancy.  Zoster vaccine.** / 1 dose for adults aged 46 years or older.  Measles, mumps, rubella (MMR) vaccine.** / You need at least 1 dose of MMR if you were born in 1957 or later. You may also need a 2nd dose. For females of childbearing age, rubella immunity should be determined. If there is no evidence of immunity, females who are not pregnant should be vaccinated. If there is no evidence of immunity, females who are pregnant should delay immunization until after pregnancy.  Pneumococcal 13-valent conjugate (PCV13) vaccine.** / Consult your health care provider.  Pneumococcal polysaccharide (PPSV23) vaccine.** / 1  to 2 doses if you smoke cigarettes or if you have certain conditions.  Meningococcal vaccine.** / Consult your health care Johnasia Liese.  Hepatitis A vaccine.** / Consult your health care Travarius Lange.  Hepatitis B vaccine.** / Consult your health care Willia Lampert.  Haemophilus influenzae type b (Hib) vaccine.** / Consult your health care Oberia Beaudoin. Ages 79 years and over  Blood pressure check.** / Every 1 to 2 years.  Lipid and cholesterol check.** / Every 5 years beginning at age 60 years.  Lung cancer screening. / Every year if you are aged 59 80 years and have a 30-pack-year history of smoking and currently smoke or have quit within the past 15 years.  Yearly screening is stopped once you have quit smoking for at least 15 years or develop a health problem that would prevent you from having lung cancer treatment.  Clinical breast exam.** / Every year after age 77 years.  BRCA-related cancer risk assessment.** / For women who have family members with a BRCA-related cancer (breast, ovarian, tubal, or peritoneal cancers).  Mammogram.** / Every year beginning at age 35 years and continuing for as long as you are in good health. Consult with your health care Mayo Owczarzak.  Pap test.** / Every 3 years starting at age 37 years through age 41 or 25 years with 3 consecutive normal Pap tests. Testing can be stopped between 65 and 70 years with 3 consecutive normal Pap tests and no abnormal Pap or HPV tests in the past 10 years.  HPV screening.** / Every 3 years from ages 58 years through ages 62 or 44 years with a history of 3 consecutive normal Pap tests. Testing can be stopped between 65 and 70 years with 3 consecutive normal Pap tests and no abnormal Pap or HPV tests in the past 10 years.  Fecal occult blood test (FOBT) of stool. / Every year beginning at age 40 years and continuing until age 73 years. You may not need to do this test if you get a colonoscopy every 10 years.  Flexible sigmoidoscopy or colonoscopy.** / Every 5 years for a flexible sigmoidoscopy or every 10 years for a colonoscopy beginning at age 20 years and continuing until age 64 years.  Hepatitis C blood test.** / For all people born from [birth year] through 1967 and any individual with known risks for hepatitis C.  Osteoporosis screening.** / A one-time screening for women ages 71 years and over and women at risk for fractures or osteoporosis.  Skin self-exam. / Monthly.  Influenza vaccine. / Every year.  Tetanus, diphtheria, and acellular pertussis (Tdap/Td) vaccine.** / 1 dose of Td every 10 years.  Varicella vaccine.** / Consult your health care Analea Muller.  Zoster vaccine.** / 1  dose for adults aged 75 years or older.  Pneumococcal 13-valent conjugate (PCV13) vaccine.** / Consult your health care Lorana Maffeo.  Pneumococcal polysaccharide (PPSV23) vaccine.** / 1 dose for all adults aged 26 years and older.  Meningococcal vaccine.** / Consult your health care Hulon Ferron.  Hepatitis A vaccine.** / Consult your health care Jabriel Vanduyne.  Hepatitis B vaccine.** / Consult your health care Mariany Mackintosh.  Haemophilus influenzae type b (Hib) vaccine.** / Consult your health care Ipek Westra. ** Family history and personal history of risk and conditions may change your health care Marlane Hirschmann's recommendations. Document Released: 02/28/2001 Document Revised: 10/23/2012 Document Reviewed: 05/30/2010 [facility] Patient Information 2014 [place], [place].

## 2013-07-01 NOTE — Progress Notes (Signed)
Pre visit review using our clinic review tool, if applicable. No additional management support is needed unless otherwise documented below in the visit note. 

## 2013-07-02 ENCOUNTER — Telehealth: Payer: Self-pay | Admitting: Family Medicine

## 2013-07-02 LAB — POCT URINALYSIS DIPSTICK
Bilirubin, UA: NEGATIVE
Glucose, UA: NEGATIVE
KETONES UA: NEGATIVE
Leukocytes, UA: NEGATIVE
Nitrite, UA: NEGATIVE
PH UA: 6
PROTEIN UA: 30
SPEC GRAV UA: 1.02
Urobilinogen, UA: 0.2

## 2013-07-02 NOTE — Addendum Note (Signed)
Addended by: Modena Morrow D on: 07/02/2013 03:31 PM   Modules accepted: Orders

## 2013-07-02 NOTE — Telephone Encounter (Signed)
Patient called to return your phone call

## 2013-07-02 NOTE — Telephone Encounter (Signed)
Caller name: Sharyn Lull Relation to pt: Call back [phone]   Reason for call:  Pt had a shingles injection on 6/16 and arm is swollen.

## 2013-07-02 NOTE — Telephone Encounter (Signed)
Patient received a shingles vaccine as well as a pneumonia vaccine yesterday.  No complaints for right arm.  However, pt states that left arm, in which she received her shingles vaccine, is swollen, warm, and red.  States that it feels like a knot on the back of her arm.   The swollen area is approximately 1 inch long and 3 inches wide.  She denies shortness breath, rash, and any other symptoms.  She was informed that this is probably a localized reaction to the shingle vaccine.  She was advised to take benadryl or an antihistamine for the swelling and to continue to monitor the injection site.  She was encouraged to call back if her symptoms worsen.

## 2013-07-02 NOTE — Telephone Encounter (Signed)
Relevant patient education assigned to patient using Emmi. ° °

## 2013-07-02 NOTE — Telephone Encounter (Signed)
Called patient again using work number and cell number listed.  Left voice message of cell for call back.

## 2013-07-02 NOTE — Telephone Encounter (Signed)
Called and left a message for call back  

## 2013-07-03 ENCOUNTER — Ambulatory Visit (HOSPITAL_BASED_OUTPATIENT_CLINIC_OR_DEPARTMENT_OTHER): Payer: 59

## 2013-07-04 ENCOUNTER — Ambulatory Visit (HOSPITAL_BASED_OUTPATIENT_CLINIC_OR_DEPARTMENT_OTHER)
Admission: RE | Admit: 2013-07-04 | Discharge: 2013-07-04 | Disposition: A | Discharge status: Home / Self Care | Payer: 59 | Source: Ambulatory Visit | Attending: Family Medicine | Admitting: Family Medicine

## 2013-07-04 DIAGNOSIS — E042 Nontoxic multinodular goiter: Secondary | ICD-10-CM | POA: Insufficient documentation

## 2013-07-04 DIAGNOSIS — E041 Nontoxic single thyroid nodule: Secondary | ICD-10-CM

## 2013-07-04 LAB — URINE CULTURE

## 2013-07-04 MED ORDER — SAXAGLIPTIN-METFORMIN ER 2.5-1000 MG PO TB24: 1.0000 | ORAL_TABLET | Freq: Two times a day (BID) | ORAL | Status: DC

## 2013-07-28 ENCOUNTER — Telehealth: Payer: Self-pay | Admitting: Family Medicine

## 2013-07-28 DIAGNOSIS — E119 Type 2 diabetes mellitus without complications: Secondary | ICD-10-CM

## 2013-07-28 MED ORDER — METFORMIN HCL 1000 MG PO TABS: 1000.0000 mg | ORAL_TABLET | Freq: Two times a day (BID) | ORAL | Status: DC

## 2013-07-28 NOTE — Telephone Encounter (Signed)
Great!

## 2013-07-28 NOTE — Telephone Encounter (Signed)
Caller name: Sharyn Lull Relation to pt: self   Call back number:[phone]   Reason for call:  Pt has questions about new diabetic medication.

## 2013-07-28 NOTE — Telephone Encounter (Signed)
Spoke with patient and she was ok with going back on the Metformin 1000 mg 1 po bid. However declined the Invokana because it cause her to have yeast infection when you put hr on it in the past. Referral put in for Endo.     KP

## 2013-07-28 NOTE — Telephone Encounter (Signed)
D/c kombiglyze and take metformin 1000 mg 1 po bid #60  2 refills invokanna 100 mg 1 po qd #30  2 refills-- give coupon Other option is endo

## 2013-07-28 NOTE — Telephone Encounter (Signed)
Spoke with patient an she stated she thinks the Kombiglyze  Is making her sick, she has been taking it for a few days and it has caused HA, anxiety and her CBG's fasting are still ranging in the 157. She said she stopped drinking and eating sweets, says she does not want anything that has anything to do with needles. Please advise     KP

## 2013-07-31 ENCOUNTER — Other Ambulatory Visit: Payer: Self-pay | Admitting: Family Medicine

## 2013-08-14 ENCOUNTER — Encounter: Payer: Self-pay | Admitting: Endocrinology

## 2013-08-29 ENCOUNTER — Ambulatory Visit (INDEPENDENT_AMBULATORY_CARE_PROVIDER_SITE_OTHER): Payer: 59 | Admitting: Internal Medicine

## 2013-08-29 ENCOUNTER — Encounter: Payer: Self-pay | Admitting: Internal Medicine

## 2013-08-29 VITALS — BP 126/80 | HR 79 | Temp 98.4°F | Resp 12 | Ht 65.0 in | Wt 179.0 lb

## 2013-08-29 DIAGNOSIS — E119 Type 2 diabetes mellitus without complications: Secondary | ICD-10-CM

## 2013-08-29 NOTE — Progress Notes (Signed)
Patient ID: MARRIAN [name], female   DOB: [birth year], 58 y.o.   MRN: [id number]  HPI: Megan [name] is a 58 y.o.-year-old female, referred by her PCP, Dr Etter Sjogren, for management of DM2, non-insulin-dependent, uncontrolled, without complications.  Patient has been diagnosed with diabetes in 2011; she has not been on insulin before.  Last hemoglobin A1c was: Lab Results  Component Value Date   HGBA1C 7.7* 07/01/2013   HGBA1C 7.0* 05/30/2012   HGBA1C 6.5* 04/26/2012   Pt is on a regimen of: - Metformin 1000 mg po in am (despite advice to take it bid) - restarted 07/2013 She tried Kombiglyze once a day >> HAs She tried Invokana >> yeast infections  Pt checks her sugars qod and they are: - am: 136-144 - 2h after b'fast: n/c - before lunch: n/c - 2h after lunch: n/c - before dinner: n/c - 2h after dinner: n/c - bedtime: n/c - nighttime: n/c No lows. Lowest sugar was 120; ?she has hypoglycemia awareness. Highest sugar was 189.  Pt's meals are: - Breakfast: oatmeal, fruit, egg, whole wheat toast - Lunch: sandwich, salad - Dinner: chicken, rice, salad - Snacks: doughnuts, pretzels, snack bar  She walks 2 miles a day. She lost 50 lbs in last 3 years.   - no CKD, last BUN/creatinine:  Lab Results  Component Value Date   BUN 12 07/01/2013   CREATININE 0.7 07/01/2013  No ACEI. - last set of lipids: Lab Results  Component Value Date   CHOL 175 07/01/2013   HDL 50.50 07/01/2013   LDLCALC 85 07/01/2013   TRIG 196.0* 07/01/2013   CHOLHDL 3 07/01/2013  On fish oil. Does not want to try statins 2/2 possible SEs. - last eye exam was in 02/2013. No DR.  - no numbness and tingling in her feet.  Pt has FH of DM in mother, MGM, brother.  She has a h/o benign R thyroid nodule (per Bx lin 2014).  ROS: Constitutional: + weight loss, + fatigue, + subjective hyperthermia (hot flushes) Eyes: + blurry vision, no xerophthalmia ENT: no sore throat, no nodules palpated in throat, no  dysphagia/odynophagia, no hoarseness Cardiovascular: no CP/SOB/palpitations/leg swelling Respiratory: + cough/no SOB Gastrointestinal: no N/V/D/C Musculoskeletal: no muscle/+ joint aches (knee) Skin: no rashes Neurological: no tremors/numbness/tingling/dizziness Psychiatric: no depression/anxiety  Past Medical History  Diagnosis Date  . Hypertension   . Diabetes mellitus   . Allergy   . Hyperlipidemia   . Infectious colitis   . Diverticulosis   . Colon polyp   . Hemorrhoids   . Ventral hernia   . GERD (gastroesophageal reflux disease)     occasionally  . H/O hiatal hernia     ?  Marland Kitchen Arthritis     fingers   Past Surgical History  Procedure Laterality Date  . Inguinal hernia repair      x's 3  . Hemorrhoid surgery    . Ventral hernia repair    . Foot surgery      Right  . Colonscopy    . Diagnostic laparoscopy    . Incisional hernia repair  01/20/2011    Procedure: LAPAROSCOPIC INCISIONAL HERNIA;  Surgeon: Judieth Keens, DO;  Location: [facility];  Service: General;  Laterality: N/A;  Laparoscopic repair of a recurrent incisional hernia with Mesh  . Hernia repair  01/20/2011    incisional   History   Social History  . Marital Status: Divorced    Spouse Name: N/A    Number of Children: 1  Occupational History  . Customer service   Social History Main Topics  . Smoking status: Former Smoker -- 1.00 packs/day for 20 years    Types: Cigarettes    Quit date: 01/05/1996  . Smokeless tobacco: Never Used  . Alcohol Use: 1.8 oz/week    2 Glasses of wine, 1 Cans of beer per week  . Drug Use: No   Current Outpatient Prescriptions on File Prior to Visit  Medication Sig Dispense Refill  . amLODipine (NORVASC) 5 MG tablet TAKE 1 TABLET BY MOUTH EVERYDAY  90 tablet  1  . FREESTYLE INSULINX TEST test strip Check blood sugar daily  100 each  12  . glucose blood (FREESTYLE LITE) test strip Use as instructed  100 each  12  . Lancets (FREESTYLE) lancets Use as instructed   100 each  12  . metFORMIN (GLUCOPHAGE) 1000 MG tablet Take 1 tablet (1,000 mg total) by mouth once daily with a meal.  60 tablet  2  . Multiple Vitamin (MULTIVITAMIN) tablet Take 1 tablet by mouth daily.       Marland Kitchen albuterol (PROAIR HFA) 108 (90 BASE) MCG/ACT inhaler Inhale 2 puffs into the lungs every 6 (six) hours as needed for wheezing.      Marland Kitchen ALPRAZolam (XANAX) 0.5 MG tablet 1 po before flying  10 tablet  0  . beclomethasone (QVAR) 40 MCG/ACT inhaler Inhale 2 puffs into the lungs 2 (two) times daily.  1 Inhaler  12  . Biotin 1000 MCG tablet Take 1,000 mcg by mouth 3 (three) times daily.      . fluticasone (FLONASE) 50 MCG/ACT nasal spray 2 sprays each nostril qd  16 g  6  . meloxicam (MOBIC) 15 MG tablet Take 1 tablet (15 mg total) by mouth daily.  30 tablet  1  . vitamin B-12 (CYANOCOBALAMIN) 500 MCG tablet Take 1,000 mcg by mouth daily.        No current facility-administered medications on file prior to visit.   Allergies  Allergen Reactions  . Aspirin Nausea Only   Family History  Problem Relation Age of Onset  . Breast cancer Mother   . Diabetes Mother   . Cancer Mother     breast  . Throat cancer Maternal Aunt   . Heart disease Father     heart attack  . Diabetes Brother   . Diabetes    . Cancer      throat.[name]  . Cancer Sister     Breast   PE: BP 126/80  Pulse 79  Temp(Src) 98.4 F (36.9 C) (Oral)  Resp 12  Ht 5\' 5"  (1.651 m)  Wt 179 lb (81.194 kg)  BMI 29.79 kg/m2  SpO2 96% Wt Readings from Last 3 Encounters:  08/29/13 179 lb (81.194 kg)  07/01/13 183 lb (83.008 kg)  06/04/13 182 lb (82.555 kg)   Constitutional: overweight, in NAD Eyes: PERRLA, EOMI, no exophthalmos ENT: moist mucous membranes, + thyromegaly R>L, no cervical lymphadenopathy Cardiovascular: RRR, No MRG Respiratory: CTA B Gastrointestinal: abdomen soft, NT, ND, BS+ Musculoskeletal: no deformities, strength intact in all 4 Skin: moist, warm, no rashes Neurological: no tremor with  outstretched hands, DTR normal in all 4  ASSESSMENT: 1. DM2, non-insulin-dependent, uncontrolled, without complications  PLAN:  1. Patient with relatively new dx of DM2, with worsening control, on half-maximal metformin dose, which is insufficient. She is only checking sugars in am and not every day. I advised her to increase to 2x a day and check at different  times of the day. For now, will increase Metformin to 1000 mg 2x a day and recheck her sugars in 1 mo. - We discussed about options for treatment, and I suggested to:  Patient Instructions  Please increase Metformin to 1000 mg 2x a day. Please return in 1 month with your sugar log.  - Strongly advised her to start checking sugars at different times of the day - check 2 times a day, rotating checks - given sugar log and advised how to fill it and to bring it at next appt  - given foot care handout and explained the principles  - given instructions for hypoglycemia management "15-15 rule"  - advised for yearly eye exams, she is up to date - discussed diet, advised to separate meals by at least 4-5 hours. Advised to limit sweet fruit juice (cranberry) - may get the diet one; reduce concentrated sweets. - Return to clinic in 1 mo with sugar log

## 2013-08-29 NOTE — Patient Instructions (Signed)
Please increase Metformin to 1000 mg 2x a day. Please return in 1 month with your sugar log.   PATIENT INSTRUCTIONS FOR TYPE 2 DIABETES:  **Please join MyChart!** - see attached instructions about how to join if you have not done so already.  DIET AND EXERCISE Diet and exercise is an important part of diabetic treatment.  We recommended aerobic exercise in the form of brisk walking (working between 40-60% of maximal aerobic capacity, similar to brisk walking) for 150 minutes per week (such as 30 minutes five days per week) along with 3 times per week performing 'resistance' training (using various gauge rubber tubes with handles) 5-10 exercises involving the major muscle groups (upper body, lower body and core) performing 10-15 repetitions (or near fatigue) each exercise. Start at half the above goal but build slowly to reach the above goals. If limited by weight, joint pain, or disability, we recommend daily walking in a swimming pool with water up to waist to reduce pressure from joints while allow for adequate exercise.    BLOOD GLUCOSES Monitoring your blood glucoses is important for continued management of your diabetes. Please check your blood glucoses 2-4 times a day: fasting, before meals and at bedtime (you can rotate these measurements - e.g. one day check before the 3 meals, the next day check before 2 of the meals and before bedtime, etc.).   HYPOGLYCEMIA (low blood sugar) Hypoglycemia is usually a reaction to not eating, exercising, or taking too much insulin/ other diabetes drugs.  Symptoms include tremors, sweating, hunger, confusion, headache, etc. Treat IMMEDIATELY with 15 grams of Carbs:   4 glucose tablets    cup regular juice/soda   2 tablespoons raisins   4 teaspoons sugar   1 tablespoon honey Recheck blood glucose in 15 mins and repeat above if still symptomatic/blood glucose <100.  RECOMMENDATIONS TO REDUCE YOUR RISK OF DIABETIC COMPLICATIONS: * Take your prescribed  MEDICATION(S) * Follow a DIABETIC diet: Complex carbs, fiber rich foods, (monounsaturated and polyunsaturated) fats * AVOID saturated/trans fats, high fat foods, >2,300 mg salt per day. * EXERCISE at least 5 times a week for 30 minutes or preferably daily.  * DO NOT SMOKE OR DRINK more than 1 drink a day. * Check your FEET every day. Do not wear tightfitting shoes. Contact us if you develop an ulcer * See your EYE doctor once a year or more if needed * Get a FLU shot once a year * Get a PNEUMONIA vaccine once before and once after age 55 years  GOALS:  * Your Hemoglobin A1c of <7%  * fasting sugars need to be <130 * after meals sugars need to be <180 (2h after you start eating) * Your Systolic BP should be 401 or lower  * Your Diastolic BP should be 80 or lower  * Your HDL (Good Cholesterol) should be 40 or higher  * Your LDL (Bad Cholesterol) should be 100 or lower. * Your Triglycerides should be 150 or lower  * Your Urine microalbumin (kidney function) should be <30 * Your Body Mass Index should be 25 or lower   We will be glad to help you achieve these goals. Our telephone number is: [phone].

## 2013-09-27 ENCOUNTER — Other Ambulatory Visit: Payer: Self-pay | Admitting: Family Medicine

## 2013-09-29 ENCOUNTER — Ambulatory Visit (INDEPENDENT_AMBULATORY_CARE_PROVIDER_SITE_OTHER): Payer: 59 | Admitting: Medical

## 2013-09-29 ENCOUNTER — Encounter: Payer: Self-pay | Admitting: Medical

## 2013-09-29 VITALS — BP 143/80 | HR 77 | Temp 98.6°F | Ht 64.75 in | Wt 180.6 lb

## 2013-09-29 DIAGNOSIS — R062 Wheezing: Secondary | ICD-10-CM

## 2013-09-29 DIAGNOSIS — J309 Allergic rhinitis, unspecified: Secondary | ICD-10-CM

## 2013-09-29 DIAGNOSIS — J45909 Unspecified asthma, uncomplicated: Secondary | ICD-10-CM

## 2013-09-29 MED ORDER — AZITHROMYCIN 250 MG PO TABS: ORAL_TABLET | ORAL | Status: DC

## 2013-09-29 MED ORDER — BENZONATATE 100 MG PO CAPS: 100.0000 mg | ORAL_CAPSULE | Freq: Three times a day (TID) | ORAL | Status: DC | PRN

## 2013-09-29 MED ORDER — BECLOMETHASONE DIPROPIONATE 40 MCG/ACT IN AERS: 2.0000 | INHALATION_SPRAY | Freq: Two times a day (BID) | RESPIRATORY_TRACT | Status: DC

## 2013-09-29 MED ORDER — FLUTICASONE PROPIONATE 50 MCG/ACT NA SUSP: 2.0000 | Freq: Every day | NASAL | Status: DC

## 2013-09-29 MED ORDER — ALBUTEROL SULFATE HFA 108 (90 BASE) MCG/ACT IN AERS: 2.0000 | INHALATION_SPRAY | Freq: Four times a day (QID) | RESPIRATORY_TRACT | Status: DC | PRN

## 2013-09-29 NOTE — Telephone Encounter (Signed)
Last seen 07/01/13 and filled 11/13/12 #124ml. Please advise    KP

## 2013-09-29 NOTE — Patient Instructions (Signed)
You appear to have some allergic rhinitis followed by reactive airway disease. I am sending in fluticasone nasal spray to your pharmacy and advising allegra for your allergies. For you wheezing I am sending albuterol inhaler and qvar inhaler. For your cough, I sent in benzonatate. I also made azithromycin available/sent to pharmcy but presently I am advising not to use antibiotic unless you have worsening signs or symptoms we discussed. Follow up in 7 days or as needed.

## 2013-09-29 NOTE — Assessment & Plan Note (Addendum)
This week she has had some dry cough accompanied with wheezing. So wrote her for an albuterol inhaler and Qvar inhalers. Also prescribed benzonatate for cough.  If her wheezing persists she will notify us.  If she gets bronchitis type symptoms as discussed then she will start Zithromax.

## 2013-09-29 NOTE — Assessment & Plan Note (Signed)
Allergic rhinitis fall exacerbation with some subsequent reactive airway disease and possible early sinusitis and bronchitis. I did prescribe her fluticasone nasal spray and advised she can get Allegra over-the-counter. Seek treatment plan for her cough/wheeze/reactive airway disease.  I did make Zithromax available in the event she has worsening signs and symptoms as we discussed.

## 2013-09-29 NOTE — Progress Notes (Signed)
Subjective:    Patient ID: Megan [name], female    DOB: [birth year], 58 y.o.   MRN: [id number]  HPI  Pt in with nasal congested with dry cough. Feels pnd. She has fall and spring allergies year round like clockwork. Cough a lot at night. Some wheezing. Pt never used albuterol before although she had it available. She also had q var available but did not use.  Pt is on some robitussin during day and clorphenerimate. She has been sick for about a week.   Past Medical History  Diagnosis Date  . Hypertension   . Diabetes mellitus   . Allergy   . Hyperlipidemia   . Infectious colitis   . Diverticulosis   . Colon polyp   . Hemorrhoids   . Ventral hernia   . GERD (gastroesophageal reflux disease)     occasionally  . H/O hiatal hernia     ?  Marland Kitchen Arthritis     fingers    History   Social History  . Marital Status: Divorced    Spouse Name: N/A    Number of Children: N/A  . Years of Education: N/A   Occupational History  . Not on file.   Social History Main Topics  . Smoking status: Former Smoker -- 1.00 packs/day for 20 years    Types: Cigarettes    Quit date: 01/05/1996  . Smokeless tobacco: Never Used  . Alcohol Use: 1.8 oz/week    2 Glasses of wine, 1 Cans of beer per week  . Drug Use: No  . Sexual Activity: Not on file   Other Topics Concern  . Not on file   Social History Narrative  . No narrative on file    Past Surgical History  Procedure Laterality Date  . Inguinal hernia repair      x's 3  . Hemorrhoid surgery    . Ventral hernia repair    . Foot surgery      Right  . Colonscopy    . Diagnostic laparoscopy    . Incisional hernia repair  01/20/2011    Procedure: LAPAROSCOPIC INCISIONAL HERNIA;  Surgeon: Judieth Keens, DO;  Location: [facility];  Service: General;  Laterality: N/A;  Laparoscopic repair of a recurrent incisional hernia with Mesh  . Hernia repair  01/20/2011    incisional    Family History  Problem Relation Age of Onset  .  Breast cancer Mother   . Diabetes Mother   . Cancer Mother     breast  . Throat cancer Maternal Aunt   . Heart disease Father     heart attack  . Diabetes Brother   . Diabetes    . Cancer      throat.[name]  . Cancer Sister     Breast    Allergies  Allergen Reactions  . Aspirin Nausea Only    Current Outpatient Prescriptions on File Prior to Visit  Medication Sig Dispense Refill  . ALPRAZolam (XANAX) 0.5 MG tablet 1 po before flying  10 tablet  0  . amLODipine (NORVASC) 5 MG tablet TAKE 1 TABLET BY MOUTH EVERYDAY  90 tablet  1  . CHERATUSSIN AC 100-10 MG/5ML syrup TAKE ONE TO TWO TEASPOONSFUL BY MOUTH AT BEDTIME AS NEEDED  120 mL  0  . Fish Oil-Cholecalciferol (FISH OIL + D3 PO) Take 1 capsule by mouth daily.      Marland Kitchen FREESTYLE INSULINX TEST test strip Check blood sugar daily  100 each  12  .  glucose blood (FREESTYLE LITE) test strip Use as instructed  100 each  12  . Lancets (FREESTYLE) lancets Use as instructed  100 each  12  . metFORMIN (GLUCOPHAGE) 1000 MG tablet Take 1 tablet (1,000 mg total) by mouth 2 (two) times daily with a meal.  60 tablet  2  . Multiple Vitamin (MULTIVITAMIN) tablet Take 1 tablet by mouth daily.       . Biotin 1000 MCG tablet Take 1,000 mcg by mouth 3 (three) times daily.      . meloxicam (MOBIC) 15 MG tablet Take 1 tablet (15 mg total) by mouth daily.  30 tablet  1  . vitamin B-12 (CYANOCOBALAMIN) 500 MCG tablet Take 1,000 mcg by mouth daily.        No current facility-administered medications on file prior to visit.    BP 143/80  Pulse 77  Temp(Src) 98.6 F (37 C) (Oral)  Ht 5' 4.75" (1.645 m)  Wt 180 lb 9.6 oz (81.92 kg)  BMI 30.27 kg/m2  SpO2 96%     Review of Systems  Constitutional: Negative for fever, chills and fatigue.  HENT: Positive for congestion, postnasal drip and voice change. Negative for ear discharge, ear pain, facial swelling, nosebleeds, rhinorrhea, sinus pressure, sneezing, sore throat and tinnitus.   Respiratory:  Positive for cough and wheezing. Negative for chest tightness.   Cardiovascular: Negative for chest pain and palpitations.  Gastrointestinal: Negative.   Musculoskeletal: Negative.   Hematological: Negative for adenopathy. Does not bruise/bleed easily.           Objective:   Physical Exam  Constitutional: She is oriented to person, place, and time. She appears well-developed and well-nourished. No distress.  HENT:  Head: Atraumatic.  Right Ear: External ear normal.  Left Ear: External ear normal.  Nose: Nose normal.  Mouth/Throat: Oropharynx is clear and moist. No oropharyngeal exudate.  Boggy turbinates and pnd. No frontal or maxillary pressure presently.  Neck: Normal range of motion. No tracheal deviation present. No thyromegaly present.  Pulmonary/Chest: Effort normal and breath sounds normal. No stridor. No respiratory distress. She has no wheezes. She has no rales. She exhibits no tenderness.  Abdominal: Soft. Bowel sounds are normal. She exhibits no distension and no mass. There is no tenderness. There is no rebound and no guarding.  Musculoskeletal: She exhibits no edema and no tenderness.  Lymphadenopathy:    She has no cervical adenopathy.  Neurological: She is alert and oriented to person, place, and time. No cranial nerve deficit.  Skin: Skin is warm and dry.  Psychiatric: She has a normal mood and affect. Her behavior is normal. Judgment and thought content normal.          Assessment & Plan:

## 2013-10-06 ENCOUNTER — Telehealth: Payer: Self-pay

## 2013-10-06 ENCOUNTER — Ambulatory Visit (INDEPENDENT_AMBULATORY_CARE_PROVIDER_SITE_OTHER): Payer: 59 | Admitting: Medical

## 2013-10-06 ENCOUNTER — Encounter: Payer: Self-pay | Admitting: Medical

## 2013-10-06 ENCOUNTER — Ambulatory Visit (HOSPITAL_BASED_OUTPATIENT_CLINIC_OR_DEPARTMENT_OTHER)
Admission: RE | Admit: 2013-10-06 | Discharge: 2013-10-06 | Disposition: A | Discharge status: Home / Self Care | Payer: 59 | Source: Ambulatory Visit | Attending: Medical | Admitting: Medical

## 2013-10-06 ENCOUNTER — Telehealth: Payer: Self-pay | Admitting: Medical

## 2013-10-06 VITALS — BP 146/86 | HR 83 | Temp 98.1°F | Ht 64.75 in | Wt 177.8 lb

## 2013-10-06 DIAGNOSIS — J309 Allergic rhinitis, unspecified: Secondary | ICD-10-CM

## 2013-10-06 DIAGNOSIS — R059 Cough, unspecified: Secondary | ICD-10-CM | POA: Insufficient documentation

## 2013-10-06 DIAGNOSIS — R062 Wheezing: Secondary | ICD-10-CM | POA: Diagnosis not present

## 2013-10-06 DIAGNOSIS — R05 Cough: Secondary | ICD-10-CM

## 2013-10-06 DIAGNOSIS — R918 Other nonspecific abnormal finding of lung field: Secondary | ICD-10-CM | POA: Diagnosis not present

## 2013-10-06 MED ORDER — PREDNISONE 10 MG PO TABS: 10.0000 mg | ORAL_TABLET | Freq: Every day | ORAL | Status: DC

## 2013-10-06 MED ORDER — HYDROCODONE-HOMATROPINE 5-1.5 MG/5ML PO SYRP: 5.0000 mL | ORAL_SOLUTION | Freq: Three times a day (TID) | ORAL | Status: DC | PRN

## 2013-10-06 MED ORDER — LEVOFLOXACIN 500 MG PO TABS: 500.0000 mg | ORAL_TABLET | Freq: Every day | ORAL | Status: DC

## 2013-10-06 NOTE — Telephone Encounter (Signed)
Rx levofloxin and put order for cxr in 2 wks. LPN will advise pt.

## 2013-10-06 NOTE — Patient Instructions (Signed)
You appear to have dry cough and wheezing from allergic rhinitis and Reactive airway disease. We will check xray to see if any infection present in lungs. I want you to stop benzonatate and your cough medicine with codeine. I prescribed you hydrocodone based cough medication. Continue your proair and start qvar. I want you to start tapered prednisone as well. Eat low bs diet and check bs daily. If your bs are increasing notify us.  Follow up in 7 days or as needed

## 2013-10-06 NOTE — Telephone Encounter (Signed)
Pt saw Mackie Pai, PA-C on 09/29/13 for Allergic Rhinitis, Asthma, and wheezing.  She evaluated and sent on home Flonase, Benzonatate, Albuterol, Qvar, and Zithromax.  Pt states she has completed her antibiotics and tried benzonatate and inhalers, but continues to experience a cough, shortness of breath, and wheezing.  She is able to speak without frequent pauses.  She is at work and is concerned her symptoms.   Advice: Appointment with Mackie Pai, PA-C today at 9:45 am.  Pt agreed with plan.

## 2013-10-06 NOTE — Assessment & Plan Note (Signed)
This worsened this past Friday. CXR make sure does not have infection. Just finished zpack. Continue inhalers. Added prednisone. For cough which may be from RAD but maybe from PND did add hydrocodone based cough syrup. Rx advisement given regarding prednisone since diabetic pt.

## 2013-10-06 NOTE — Telephone Encounter (Signed)
Patient in for appointment with E.Saguier to day.

## 2013-10-06 NOTE — Assessment & Plan Note (Signed)
These symptoms some better.

## 2013-10-06 NOTE — Telephone Encounter (Signed)
I think she needs to be seen in the office and I see that she has appointment today.

## 2013-10-06 NOTE — Progress Notes (Signed)
   Subjective:    Patient ID: Megan [name], female    DOB: 1956/01/14, 58 y.o.   MRN: [id number]  HPI   Pt states that this past Friday she got worse with wheeze and dry cough. The cough is constant and dry. Pt has been using proventil. She has not using qvar inhaler. Pt has no fevers or chills. She has used azithromycin.  She got worse on Friday.         Review of Systems  Constitutional: Negative for fever, chills and fatigue.  HENT: Negative for ear pain, facial swelling, nosebleeds, postnasal drip, sneezing, sore throat and voice change.        Improved allergy symptoms but wheezing now.  Eyes: Negative for pain and redness.  Respiratory: Positive for cough and wheezing. Negative for chest tightness.   Cardiovascular: Negative for chest pain and leg swelling.  Gastrointestinal: Negative.   Musculoskeletal: Negative for back pain and myalgias.       No leg pain.  Skin: Negative for color change.  Allergic/Immunologic: Positive for environmental allergies.  Neurological: Negative.   Hematological: Negative for adenopathy. Does not bruise/bleed easily.  Psychiatric/Behavioral: Negative.        Objective:   Physical Exam  General  Mental Status - Alert. General Appearance - Well groomed. Not in acute distress.  Skin Rashes- No Rashes.  HEENT Head- Normal. Ear Auditory Canal - Left- Normal. Right - Normal.Tympanic Membrane- Left- Normal. Right- Normal. Eye Sclera/Conjunctiva- Left- Normal. Right- Normal. Nose & Sinuses Nasal Mucosa- Left-  Boggy + Congested. Right- Boggy + Congested. Mouth & Throat Lips: Upper Lip- Normal: no dryness, cracking, pallor, cyanosis, or vesicular eruption. Lower Lip-Normal: no dryness, cracking, pallor, cyanosis or vesicular eruption. Buccal Mucosa- Bilateral- No Aphthous ulcers. Oropharynx- No Discharge or Erythema. Tonsils: Characteristics- Bilateral- No Erythema or Congestion. Size/Enlargement- Bilateral- No enlargement.  Discharge- bilateral-None.  Neck Neck- Supple. No Masses.   Chest and Lung Exam Auscultation: Breath Sounds:- Scattered wheezing throughout all lung fields. Even and unlabored.   Cardiovascular Auscultation:Rythm- Regular, rate and rhythm. Murmurs & Other Heart Sounds:Ausculatation of the heart reveal- No Murmurs.  Lymphatic Head & Neck General Head & Neck Lymphatics: Bilateral: Description- No Localized lymphadenopathy.  Neurologic CN III- XII grossly intact.        Assessment & Plan:  Advised pt to stop benzonatate and robitussin AC.

## 2013-10-07 ENCOUNTER — Telehealth: Payer: Self-pay | Admitting: Family Medicine

## 2013-10-07 NOTE — Telephone Encounter (Signed)
Pt would like to know if the results are back from her x-ray. Pt states you can call her back at [phone] until 5pm after 5 you can reach her on her cell.

## 2013-10-07 NOTE — Telephone Encounter (Signed)
I did call pt and let her know the cxr findings and possible rt lower lobe pneumonia. She questioned my advisement on another antibiotic since she finished azithromycin(and did not get better) and other medications(which the thought was antibiotic and I explained prednisone is not antibiotic) and she is not significantly better. She did however report that cough is improved some after adding  the prednisone. Pt was obviously concerned with my advisement and indicated she wanted [facility] opinion. So I advised her would talk with Dr. Etter Sjogren in the morning and call her back. Pt was ok with that.

## 2013-10-08 NOTE — Telephone Encounter (Signed)
E, Saguier,PA notified patient.

## 2013-10-08 NOTE — Telephone Encounter (Signed)
I talked with pt and informed her that Dr. Etter Sjogren is not in today.Pt states she is doing ok today still coughing some. Not a lot of wheezing. Cough syrup does stop cough. She is on inhalers and prednsione. She states she is willing to start antibiotic if I want her to. She has no fevers or chills. She was a bit hesitant yesterday on that advisement so I advised her that we can wait one more day and see if Dr. Etter Sjogren agrees with adding antibiotic as I had recommended to her yesterday. Pt ok with this plan.

## 2013-10-09 ENCOUNTER — Telehealth: Payer: Self-pay | Admitting: Medical

## 2013-10-09 DIAGNOSIS — J189 Pneumonia, unspecified organism: Secondary | ICD-10-CM

## 2013-10-09 NOTE — Telephone Encounter (Signed)
I did talk with Dr. Etter Sjogren and explained the clinical presentation last 2 visits and xray results. She agreed to place her on stronger antibiotic and agreed with choice of levaquin. I called pt mobile phone and left a message advising to start the antibiotic and that I sent it to her pharmacy on the 10-06-2013. Continue other meds I prescribed.  I advised her that I will place future xray order for her to get in 10 days to compare to prior.

## 2013-10-13 ENCOUNTER — Ambulatory Visit (HOSPITAL_BASED_OUTPATIENT_CLINIC_OR_DEPARTMENT_OTHER)
Admission: RE | Admit: 2013-10-13 | Discharge: 2013-10-13 | Disposition: A | Discharge status: Home / Self Care | Payer: 59 | Source: Ambulatory Visit | Attending: Family | Admitting: Family

## 2013-10-13 ENCOUNTER — Encounter: Payer: Self-pay | Admitting: Family

## 2013-10-13 ENCOUNTER — Ambulatory Visit (INDEPENDENT_AMBULATORY_CARE_PROVIDER_SITE_OTHER): Payer: 59 | Admitting: Family

## 2013-10-13 VITALS — BP 135/77 | HR 78 | Temp 98.3°F | Resp 18 | Ht 64.75 in | Wt 174.2 lb

## 2013-10-13 DIAGNOSIS — J189 Pneumonia, unspecified organism: Secondary | ICD-10-CM | POA: Diagnosis present

## 2013-10-13 MED ORDER — CLARITHROMYCIN 500 MG PO TABS: 500.0000 mg | ORAL_TABLET | Freq: Two times a day (BID) | ORAL | Status: DC

## 2013-10-13 MED ORDER — CEFDINIR 300 MG PO CAPS: 300.0000 mg | ORAL_CAPSULE | Freq: Two times a day (BID) | ORAL | Status: DC

## 2013-10-13 NOTE — Progress Notes (Signed)
Pre visit review using our clinic review tool, if applicable. No additional management support is needed unless otherwise documented below in the visit note. 

## 2013-10-13 NOTE — Patient Instructions (Addendum)
Stop prednisone. Start Biaxin (clarithromycin), start Omnicef (cefdinir) Follow up in 1 week.  Call sooner if symptoms worsen or if not improved in 2-3 days.

## 2013-10-13 NOTE — Progress Notes (Signed)
Subjective:    Patient ID: Megan [name], female    DOB: 1955-03-21, 58 y.o.   MRN: [id number]  HPI   [name] is a 58 yr old female who presents today for follow up of possible RLL pneumonia. She was initially treated with zpak but then symptoms worsened and she was re-evaluated on 9/21. X-ray at that time noted possible RLL pneumonia. She was changed to levaquin and given prednisone at that time.  Pt reports that her cough is improved but she still feels "really bad." Reports generalizes weakness. She notes note feeling well on prednisone.   Review of Systems See HPI  Past Medical History  Diagnosis Date  . Hypertension   . Diabetes mellitus   . Allergy   . Hyperlipidemia   . Infectious colitis   . Diverticulosis   . Colon polyp   . Hemorrhoids   . Ventral hernia   . GERD (gastroesophageal reflux disease)     occasionally  . H/O hiatal hernia     ?  Marland Kitchen Arthritis     fingers    History   Social History  . Marital Status: Divorced    Spouse Name: N/A    Number of Children: N/A  . Years of Education: N/A   Occupational History  . Not on file.   Social History Main Topics  . Smoking status: Former Smoker -- 1.00 packs/day for 20 years    Types: Cigarettes    Quit date: 01/05/1996  . Smokeless tobacco: Never Used  . Alcohol Use: 1.8 oz/week    2 Glasses of wine, 1 Cans of beer per week  . Drug Use: No  . Sexual Activity: Not on file   Other Topics Concern  . Not on file   Social History Narrative  . No narrative on file    Past Surgical History  Procedure Laterality Date  . Inguinal hernia repair      x's 3  . Hemorrhoid surgery    . Ventral hernia repair    . Foot surgery      Right  . Colonscopy    . Diagnostic laparoscopy    . Incisional hernia repair  01/20/2011    Procedure: LAPAROSCOPIC INCISIONAL HERNIA;  Surgeon: Judieth Keens, DO;  Location: [facility];  Service: General;  Laterality: N/A;  Laparoscopic repair of a recurrent  incisional hernia with Mesh  . Hernia repair  01/20/2011    incisional    Family History  Problem Relation Age of Onset  . Breast cancer Mother   . Diabetes Mother   . Cancer Mother     breast  . Throat cancer Maternal Aunt   . Heart disease Father     heart attack  . Diabetes Brother   . Diabetes    . Cancer      throat.[name]  . Cancer Sister     Breast    Allergies  Allergen Reactions  . Aspirin Nausea Only    Current Outpatient Prescriptions on File Prior to Visit  Medication Sig Dispense Refill  . albuterol (PROVENTIL HFA;VENTOLIN HFA) 108 (90 BASE) MCG/ACT inhaler Inhale 2 puffs into the lungs every 6 (six) hours as needed for wheezing or shortness of breath.  1 Inhaler  0  . ALPRAZolam (XANAX) 0.5 MG tablet 1 po before flying  10 tablet  0  . amLODipine (NORVASC) 5 MG tablet TAKE 1 TABLET BY MOUTH EVERYDAY  90 tablet  1  . beclomethasone (QVAR) 40 MCG/ACT  inhaler Inhale 2 puffs into the lungs 2 (two) times daily.  1 Inhaler  12  . Biotin 1000 MCG tablet Take 1,000 mcg by mouth 3 (three) times daily.      . Fish Oil-Cholecalciferol (FISH OIL + D3 PO) Take 1 capsule by mouth daily.      . fluticasone (FLONASE) 50 MCG/ACT nasal spray Place 2 sprays into both nostrils daily.  16 g  6  . FREESTYLE INSULINX TEST test strip Check blood sugar daily  100 each  12  . glucose blood (FREESTYLE LITE) test strip Use as instructed  100 each  12  . HYDROcodone-homatropine (HYCODAN) 5-1.5 MG/5ML syrup Take 5 mLs by mouth every 8 (eight) hours as needed for cough.  120 mL  0  . Lancets (FREESTYLE) lancets Use as instructed  100 each  12  . levofloxacin (LEVAQUIN) 500 MG tablet Take 1 tablet (500 mg total) by mouth daily.  7 tablet  0  . meloxicam (MOBIC) 15 MG tablet Take 1 tablet (15 mg total) by mouth daily.  30 tablet  1  . metFORMIN (GLUCOPHAGE) 1000 MG tablet Take 1 tablet (1,000 mg total) by mouth 2 (two) times daily with a meal.  60 tablet  2  . Multiple Vitamin (MULTIVITAMIN)  tablet Take 1 tablet by mouth daily.       . predniSONE (DELTASONE) 10 MG tablet Take 1 tablet (10 mg total) by mouth daily with breakfast. 4 tab po  day 1 and day 2, then 3 tab po  day 3 and day 4, then 2 tab po day 5 then 1 tab po day 6.  17 tablet  0  . vitamin B-12 (CYANOCOBALAMIN) 500 MCG tablet Take 1,000 mcg by mouth daily.       Marland Kitchen azithromycin (ZITHROMAX) 250 MG tablet 2 tab po day 1 then 1 tab po x 4 days  6 tablet  0   No current facility-administered medications on file prior to visit.    BP 135/77  Pulse 78  Temp(Src) 98.3 F (36.8 C) (Oral)  Resp 18  Ht 5' 4.75" (1.645 m)  Wt 174 lb 3.2 oz (79.017 kg)  BMI 29.20 kg/m2  SpO2 100%       Objective:   Physical Exam  Constitutional: She is oriented to person, place, and time. She appears well-developed and well-nourished. No distress.  Cardiovascular: Normal rate and regular rhythm.   No murmur heard. Pulmonary/Chest: Effort normal. She has rales in the right lower field.  Neurological: She is alert and oriented to person, place, and time.  Skin: Skin is warm and dry.  Psychiatric: She has a normal mood and affect. Her behavior is normal. Judgment and thought content normal.          Assessment & Plan:

## 2013-10-14 ENCOUNTER — Encounter: Payer: Self-pay | Admitting: Internal Medicine

## 2013-10-15 ENCOUNTER — Ambulatory Visit: Payer: 59 | Admitting: Internal Medicine

## 2013-10-15 DIAGNOSIS — J189 Pneumonia, unspecified organism: Secondary | ICD-10-CM | POA: Insufficient documentation

## 2013-10-15 NOTE — Assessment & Plan Note (Signed)
Clinically not much improved.  RLL infiltrate is somewhat improved on CXR.   Stop prednisone. Start Biaxin (clarithromycin), start Omnicef (cefdinir) Follow up in 1 week.  Call sooner if symptoms worsen or if not improved in 2-3 days.

## 2013-10-17 ENCOUNTER — Encounter: Payer: Self-pay | Admitting: Medical

## 2013-10-17 ENCOUNTER — Ambulatory Visit (INDEPENDENT_AMBULATORY_CARE_PROVIDER_SITE_OTHER): Payer: 59 | Admitting: Medical

## 2013-10-17 VITALS — BP 140/78 | HR 91 | Temp 98.4°F | Ht 64.75 in | Wt 175.4 lb

## 2013-10-17 DIAGNOSIS — R61 Generalized hyperhidrosis: Secondary | ICD-10-CM

## 2013-10-17 DIAGNOSIS — J189 Pneumonia, unspecified organism: Secondary | ICD-10-CM

## 2013-10-17 LAB — CBC WITH DIFFERENTIAL/PLATELET
Basophils Absolute: 0 10*3/uL (ref 0.0–0.1)
Basophils Relative: 0.4 % (ref 0.0–3.0)
EOS ABS: 0.1 10*3/uL (ref 0.0–0.7)
Eosinophils Relative: 1.4 % (ref 0.0–5.0)
HCT: 39.7 % (ref 36.0–46.0)
HEMOGLOBIN: 12.4 g/dL (ref 12.0–15.0)
LYMPHS PCT: 27.9 % (ref 12.0–46.0)
Lymphs Abs: 3 10*3/uL (ref 0.7–4.0)
MCHC: 31.2 g/dL (ref 30.0–36.0)
MCV: 71 fl — AB (ref 78.0–100.0)
Monocytes Absolute: 0.7 10*3/uL (ref 0.1–1.0)
Monocytes Relative: 6.2 % (ref 3.0–12.0)
NEUTROS ABS: 6.8 10*3/uL (ref 1.4–7.7)
Neutrophils Relative %: 64.1 % (ref 43.0–77.0)
Platelets: 388 10*3/uL (ref 150.0–400.0)
RBC: 5.59 Mil/uL — ABNORMAL HIGH (ref 3.87–5.11)
RDW: 15 % (ref 11.5–15.5)
WBC: 10.6 10*3/uL — ABNORMAL HIGH (ref 4.0–10.5)

## 2013-10-17 NOTE — Assessment & Plan Note (Signed)
Clinically improved not past 2 days. Continue the cefdinir and biaxin. Since some diaphoresis at times and return to work date approaching did get cbc today and want her to get cxr on Monday am. Then based on lab, cxr and how she is feeling will determine return to work date.

## 2013-10-17 NOTE — Patient Instructions (Signed)
I want you to continue the antibiotics and cough medicine. Get cbc today and on Monday get repeat cxr. Then on Monday will decide on return to work date.

## 2013-10-17 NOTE — Progress Notes (Signed)
Subjective:    Patient ID: Megan [name], female    DOB: January 30, 1955, 58 y.o.   MRN: [id number]  HPI  Pt in states she is feeling better now. Pt states just now feeling better 2 days ago. With activity her energy is still decreased. No fever or chills. Pt states no obvious wheezing. No use of inhaler. Pt is now on biaxin and cefdinir. No longer on prednisone. Pt is still sweating occasionally.  Pt is on cough syrup which she states is adequate.  Past Medical History  Diagnosis Date  . Hypertension   . Diabetes mellitus   . Allergy   . Hyperlipidemia   . Infectious colitis   . Diverticulosis   . Colon polyp   . Hemorrhoids   . Ventral hernia   . GERD (gastroesophageal reflux disease)     occasionally  . H/O hiatal hernia     ?  Marland Kitchen Arthritis     fingers    History   Social History  . Marital Status: Divorced    Spouse Name: N/A    Number of Children: N/A  . Years of Education: N/A   Occupational History  . Not on file.   Social History Main Topics  . Smoking status: Former Smoker -- 1.00 packs/day for 20 years    Types: Cigarettes    Quit date: 01/05/1996  . Smokeless tobacco: Never Used  . Alcohol Use: 1.8 oz/week    2 Glasses of wine, 1 Cans of beer per week  . Drug Use: No  . Sexual Activity: Not on file   Other Topics Concern  . Not on file   Social History Narrative  . No narrative on file    Past Surgical History  Procedure Laterality Date  . Inguinal hernia repair      x's 3  . Hemorrhoid surgery    . Ventral hernia repair    . Foot surgery      Right  . Colonscopy    . Diagnostic laparoscopy    . Incisional hernia repair  01/20/2011    Procedure: LAPAROSCOPIC INCISIONAL HERNIA;  Surgeon: Judieth Keens, DO;  Location: [facility];  Service: General;  Laterality: N/A;  Laparoscopic repair of a recurrent incisional hernia with Mesh  . Hernia repair  01/20/2011    incisional    Family History  Problem Relation Age of Onset  . Breast  cancer Mother   . Diabetes Mother   . Cancer Mother     breast  . Throat cancer Maternal Aunt   . Heart disease Father     heart attack  . Diabetes Brother   . Diabetes    . Cancer      throat.[name]  . Cancer Sister     Breast    Allergies  Allergen Reactions  . Aspirin Nausea Only    Current Outpatient Prescriptions on File Prior to Visit  Medication Sig Dispense Refill  . albuterol (PROVENTIL HFA;VENTOLIN HFA) 108 (90 BASE) MCG/ACT inhaler Inhale 2 puffs into the lungs every 6 (six) hours as needed for wheezing or shortness of breath.  1 Inhaler  0  . ALPRAZolam (XANAX) 0.5 MG tablet 1 po before flying  10 tablet  0  . amLODipine (NORVASC) 5 MG tablet TAKE 1 TABLET BY MOUTH EVERYDAY  90 tablet  1  . beclomethasone (QVAR) 40 MCG/ACT inhaler Inhale 2 puffs into the lungs 2 (two) times daily.  1 Inhaler  12  . Biotin 1000 MCG  tablet Take 1,000 mcg by mouth 3 (three) times daily.      . cefdinir (OMNICEF) 300 MG capsule Take 1 capsule (300 mg total) by mouth 2 (two) times daily.  14 capsule  0  . clarithromycin (BIAXIN) 500 MG tablet Take 1 tablet (500 mg total) by mouth 2 (two) times daily.  14 tablet  0  . Fish Oil-Cholecalciferol (FISH OIL + D3 PO) Take 1 capsule by mouth daily.      . fluticasone (FLONASE) 50 MCG/ACT nasal spray Place 2 sprays into both nostrils daily.  16 g  6  . FREESTYLE INSULINX TEST test strip Check blood sugar daily  100 each  12  . glucose blood (FREESTYLE LITE) test strip Use as instructed  100 each  12  . HYDROcodone-homatropine (HYCODAN) 5-1.5 MG/5ML syrup Take 5 mLs by mouth every 8 (eight) hours as needed for cough.  120 mL  0  . Lancets (FREESTYLE) lancets Use as instructed  100 each  12  . meloxicam (MOBIC) 15 MG tablet Take 1 tablet (15 mg total) by mouth daily.  30 tablet  1  . metFORMIN (GLUCOPHAGE) 1000 MG tablet Take 1 tablet (1,000 mg total) by mouth 2 (two) times daily with a meal.  60 tablet  2  . Multiple Vitamin (MULTIVITAMIN) tablet  Take 1 tablet by mouth daily.       . predniSONE (DELTASONE) 10 MG tablet Take 1 tablet (10 mg total) by mouth daily with breakfast. 4 tab po  day 1 and day 2, then 3 tab po  day 3 and day 4, then 2 tab po day 5 then 1 tab po day 6.  17 tablet  0  . vitamin B-12 (CYANOCOBALAMIN) 500 MCG tablet Take 1,000 mcg by mouth daily.        No current facility-administered medications on file prior to visit.    BP 140/78  Pulse 91  Temp(Src) 98.4 F (36.9 C) (Oral)  Ht 5' 4.75" (1.645 m)  Wt 175 lb 6.4 oz (79.561 kg)  BMI 29.40 kg/m2  SpO2 97%     Review of Systems  Constitutional: Positive for diaphoresis and fatigue. Negative for fever and chills.       Energy level is improved but get some tired with activity.  HENT: Negative.   Respiratory: Positive for cough. Negative for chest tightness, shortness of breath and wheezing.        Mild occasional cough and now productive.  Gastrointestinal: Negative.   Musculoskeletal: Negative.   Neurological: Negative.   Hematological: Negative for adenopathy. Does not bruise/bleed easily.       Objective:   Physical Exam  General  Mental Status - Alert. General Appearance - Well groomed. Not in acute distress.  Skin Rashes- No Rashes.  HEENT Head- Normal.. Eye Sclera/Conjunctiva- Left- Normal. Right- Normal. Nose & Sinuses Nasal Mucosa- Left- Not Boggy or Congested. Right- Not Boggy or Congested. .  Neck Neck- Supple. No Masses. No lymphadenopathy.   Chest and Lung Exam Auscultation: Breath Sounds:-Normal., clear evan and unlabored today Cardiovascular Auscultation:Rythm- Regular, rate and rythm Murmurs & Other Heart Sounds:Ausculatation of the heart reveal- No Murmurs.  Lymphatic Head & Neck General Head & Neck Lymphatics: Bilateral: Description- No Localized lymphadenopathy.        Assessment & Plan:

## 2013-10-20 ENCOUNTER — Encounter: Payer: Self-pay | Admitting: Medical

## 2013-10-20 ENCOUNTER — Ambulatory Visit (HOSPITAL_BASED_OUTPATIENT_CLINIC_OR_DEPARTMENT_OTHER)
Admission: RE | Admit: 2013-10-20 | Discharge: 2013-10-20 | Disposition: A | Discharge status: Home / Self Care | Payer: 59 | Source: Ambulatory Visit | Attending: Medical | Admitting: Medical

## 2013-10-20 ENCOUNTER — Telehealth: Payer: Self-pay | Admitting: Family Medicine

## 2013-10-20 ENCOUNTER — Telehealth: Payer: Self-pay | Admitting: Medical

## 2013-10-20 DIAGNOSIS — J398 Other specified diseases of upper respiratory tract: Secondary | ICD-10-CM | POA: Insufficient documentation

## 2013-10-20 DIAGNOSIS — J189 Pneumonia, unspecified organism: Secondary | ICD-10-CM

## 2013-10-20 DIAGNOSIS — Z8701 Personal history of pneumonia (recurrent): Secondary | ICD-10-CM | POA: Diagnosis not present

## 2013-10-20 DIAGNOSIS — Z09 Encounter for follow-up examination after completed treatment for conditions other than malignant neoplasm: Secondary | ICD-10-CM | POA: Insufficient documentation

## 2013-10-20 NOTE — Telephone Encounter (Signed)
Opened to make return to work note.

## 2013-10-20 NOTE — Telephone Encounter (Signed)
Pt lungs are clear. I will release her to work  [employer] [phone].  Reviewed wbc today which is very near normal. Feels sweats at times. Finish last day of antibiotic. When feels sweats check her temperature to see if actually has fever. If so consider re-xray and further work up.

## 2013-10-20 NOTE — Telephone Encounter (Signed)
Caller name: michelle Relation to pt: self Call back number: [phone] Pharmacy:  Reason for call:   Patient is requesting Xray results from this morning

## 2013-10-21 ENCOUNTER — Telehealth: Payer: Self-pay | Admitting: Medical

## 2013-10-21 NOTE — Telephone Encounter (Signed)
Preventative care healthy diet, 8 hours of sleep at night, multivitamin, avoid stress, and flu vaccine is recommended unless she has contraindication.

## 2013-10-23 NOTE — Telephone Encounter (Signed)
Note to LPN asking her to respond to pt request regarding prevention. See my advisement.

## 2013-11-24 ENCOUNTER — Telehealth: Payer: Self-pay | Admitting: Family Medicine

## 2013-11-24 ENCOUNTER — Encounter: Payer: Self-pay | Admitting: Family Medicine

## 2013-11-24 ENCOUNTER — Ambulatory Visit (INDEPENDENT_AMBULATORY_CARE_PROVIDER_SITE_OTHER): Payer: 59 | Admitting: Family Medicine

## 2013-11-24 VITALS — BP 136/72 | HR 83 | Temp 98.6°F | Wt 181.0 lb

## 2013-11-24 DIAGNOSIS — E785 Hyperlipidemia, unspecified: Secondary | ICD-10-CM

## 2013-11-24 DIAGNOSIS — E1310 Other specified diabetes mellitus with ketoacidosis without coma: Secondary | ICD-10-CM

## 2013-11-24 DIAGNOSIS — E1110 Type 2 diabetes mellitus with ketoacidosis without coma: Secondary | ICD-10-CM

## 2013-11-24 DIAGNOSIS — I10 Essential (primary) hypertension: Secondary | ICD-10-CM

## 2013-11-24 DIAGNOSIS — J189 Pneumonia, unspecified organism: Secondary | ICD-10-CM

## 2013-11-24 NOTE — Progress Notes (Signed)
Pre visit review using our clinic review tool, if applicable. No additional management support is needed unless otherwise documented below in the visit note. 

## 2013-11-24 NOTE — Patient Instructions (Signed)
Diabetes and Standards of Medical Care Diabetes is complicated. You may find that your diabetes team includes a dietitian, nurse, diabetes educator, eye doctor, and more. To help everyone know what is going on and to help you get the care you deserve, the following schedule of care was developed to help keep you on track. Below are the tests, exams, vaccines, medicines, education, and plans you will need. HbA1c test This test shows how well you have controlled your glucose over the past 2-3 months. It is used to see if your diabetes management plan needs to be adjusted.   It is performed at least 2 times a year if you are meeting treatment goals.  It is performed 4 times a year if therapy has changed or if you are not meeting treatment goals. Blood pressure test  This test is performed at every routine medical visit. The goal is less than 140/90 mm Hg for most people, but 130/80 mm Hg in some cases. Ask your health care provider about your goal. Dental exam  Follow up with the dentist regularly. Eye exam  If you are diagnosed with type 1 diabetes as a child, get an exam upon reaching the age of 37 years or older and have had diabetes for 3-5 years. Yearly eye exams are recommended after that initial eye exam.  If you are diagnosed with type 1 diabetes as an adult, get an exam within 5 years of diagnosis and then yearly.  If you are diagnosed with type 2 diabetes, get an exam as soon as possible after the diagnosis and then yearly. Foot care exam  Visual foot exams are performed at every routine medical visit. The exams check for cuts, injuries, or other problems with the feet.  A comprehensive foot exam should be done yearly. This includes visual inspection as well as assessing foot pulses and testing for loss of sensation.  Check your feet nightly for cuts, injuries, or other problems with your feet. Tell your health care provider if anything is not healing. Kidney function test (urine  microalbumin)  This test is performed once a year.  Type 1 diabetes: The first test is performed 5 years after diagnosis.  Type 2 diabetes: The first test is performed at the time of diagnosis.  A serum creatinine and estimated glomerular filtration rate (eGFR) test is done once a year to assess the level of chronic kidney disease (CKD), if present. Lipid profile (cholesterol, HDL, LDL, triglycerides)  Performed every 5 years for most people.  The goal for LDL is less than 100 mg/dL. If you are at high risk, the goal is less than 70 mg/dL.  The goal for HDL is 40 mg/dL-50 mg/dL for men and 50 mg/dL-60 mg/dL for women. An HDL cholesterol of 60 mg/dL or higher gives some protection against heart disease.  The goal for triglycerides is less than 150 mg/dL. Influenza vaccine, pneumococcal vaccine, and hepatitis B vaccine  The influenza vaccine is recommended yearly.  It is recommended that people with diabetes who are over 24 years old get the pneumonia vaccine. In some cases, two separate shots may be given. Ask your health care provider if your pneumonia vaccination is up to date.  The hepatitis B vaccine is also recommended for adults with diabetes. Diabetes self-management education  Education is recommended at diagnosis and ongoing as needed. Treatment plan  Your treatment plan is reviewed at every medical visit. Document Released: 10/30/2008 Document Revised: 05/19/2013 Document Reviewed: 06/04/2012 [facility] Patient Information 2015 [place]. This information is not intended to replace advice given to you by your health care provider. Make sure you discuss any questions you have with your health care provider.  

## 2013-11-24 NOTE — Progress Notes (Signed)
Subjective:    Patient ID: Megan [name], female    DOB: [birth year], 58 y.o.   MRN: [id number]  HPI Pt here f/u pneumonia.  Pt has finished all meds and is feeling better but is tired.  She is also here for f/u.  HPI HYPERTENSION  Blood pressure range-good  Chest pain- no      Dyspnea- no Lightheadedness- no   Edema- no Other side effects - no   Medication compliance: good Low salt diet- no  DIABETES  Blood Sugar ranges-good  Polyuria- no New Visual problems- no Hypoglycemic symptoms- no Other side effects-no Medication compliance - good Last eye exam- 2015 Foot exam- today  HYPERLIPIDEMIA  Medication compliance- good RUQ pain- no  Muscle aches- no Other side effects-no  Past Medical History  Diagnosis Date  . Hypertension   . Diabetes mellitus   . Allergy   . Hyperlipidemia   . Infectious colitis   . Diverticulosis   . Colon polyp   . Hemorrhoids   . Ventral hernia   . GERD (gastroesophageal reflux disease)     occasionally  . H/O hiatal hernia     ?  Marland Kitchen Arthritis     fingers   History   Social History  . Marital Status: Divorced    Spouse Name: N/A    Number of Children: N/A  . Years of Education: N/A   Occupational History  . Not on file.   Social History Main Topics  . Smoking status: Former Smoker -- 1.00 packs/day for 20 years    Types: Cigarettes    Quit date: 01/05/1996  . Smokeless tobacco: Never Used  . Alcohol Use: 1.8 oz/week    2 Glasses of wine, 1 Cans of beer per week  . Drug Use: No  . Sexual Activity: Not on file   Other Topics Concern  . Not on file   Social History Narrative   Family History  Problem Relation Age of Onset  . Breast cancer Mother   . Diabetes Mother   . Cancer Mother     breast  . Throat cancer Maternal Aunt   . Heart disease Father     heart attack  . Diabetes Brother   . Diabetes    . Cancer      throat.[name]  . Cancer Sister     Breast   Current Outpatient Prescriptions    Medication Sig Dispense Refill  . albuterol (PROVENTIL HFA;VENTOLIN HFA) 108 (90 BASE) MCG/ACT inhaler Inhale 2 puffs into the lungs every 6 (six) hours as needed for wheezing or shortness of breath. 1 Inhaler 0  . ALPRAZolam (XANAX) 0.5 MG tablet 1 po before flying 10 tablet 0  . amLODipine (NORVASC) 5 MG tablet TAKE 1 TABLET BY MOUTH EVERYDAY 90 tablet 1  . beclomethasone (QVAR) 40 MCG/ACT inhaler Inhale 2 puffs into the lungs 2 (two) times daily. 1 Inhaler 12  . Biotin 1000 MCG tablet Take 1,000 mcg by mouth 3 (three) times daily.    . Fish Oil-Cholecalciferol (FISH OIL + D3 PO) Take 1 capsule by mouth daily.    . fluticasone (FLONASE) 50 MCG/ACT nasal spray Place 2 sprays into both nostrils daily. 16 g 6  . FREESTYLE INSULINX TEST test strip Check blood sugar daily 100 each 12  . glucose blood (FREESTYLE LITE) test strip Use as instructed 100 each 12  . Lancets (FREESTYLE) lancets Use as instructed 100 each 12  . meloxicam (MOBIC) 15 MG tablet Take 1 tablet (  15 mg total) by mouth daily. 30 tablet 1  . metFORMIN (GLUCOPHAGE) 1000 MG tablet Take 1 tablet (1,000 mg total) by mouth 2 (two) times daily with a meal. 60 tablet 2  . Multiple Vitamin (MULTIVITAMIN) tablet Take 1 tablet by mouth daily.     . vitamin B-12 (CYANOCOBALAMIN) 500 MCG tablet Take 1,000 mcg by mouth daily.      No current facility-administered medications for this visit.     Review of Systems  As above  Objective:   Physical Exam  BP 136/72 mmHg  Pulse 83  Temp(Src) 98.6 F (37 C) (Oral)  Wt 181 lb (82.101 kg)  SpO2 97% General appearance: alert, cooperative, appears stated age and no distress Neck: no adenopathy, supple, symmetrical, trachea midline and thyroid not enlarged, symmetric, no tenderness/mass/nodules Lungs: clear to auscultation bilaterally Heart: S1, S2 normal Extremities: extremities normal, atraumatic, no cyanosis or edema Sensory exam of the foot is normal, tested with the monofilament.  Good pulses, no lesions or ulcers, good peripheral pulses.      Assessment & Plan:  1. DM (diabetes mellitus) type 2, uncontrolled, with ketoacidosis Check labs, con't meds - Basic metabolic panel; Future - Hemoglobin A1c; Future - POCT urinalysis dipstick; Future - Microalbumin / creatinine urine ratio; Future  2. Hyperlipidemia Check labs - Hepatic function panel; Future - Lipid panel; Future  3. Essential hypertension Stable con't meds  4. CAP (community acquired pneumonia) resolved

## 2013-11-24 NOTE — Assessment & Plan Note (Signed)
Resolved. F/u prn.

## 2013-12-19 ENCOUNTER — Other Ambulatory Visit (INDEPENDENT_AMBULATORY_CARE_PROVIDER_SITE_OTHER): Payer: 59

## 2013-12-19 DIAGNOSIS — R8290 Unspecified abnormal findings in urine: Secondary | ICD-10-CM

## 2013-12-19 DIAGNOSIS — E1310 Other specified diabetes mellitus with ketoacidosis without coma: Secondary | ICD-10-CM

## 2013-12-19 DIAGNOSIS — E785 Hyperlipidemia, unspecified: Secondary | ICD-10-CM

## 2013-12-19 DIAGNOSIS — R319 Hematuria, unspecified: Secondary | ICD-10-CM

## 2013-12-19 DIAGNOSIS — E1110 Type 2 diabetes mellitus with ketoacidosis without coma: Secondary | ICD-10-CM

## 2013-12-19 LAB — POCT URINALYSIS DIPSTICK
BILIRUBIN UA: NEGATIVE
GLUCOSE UA: NEGATIVE
Ketones, UA: NEGATIVE
LEUKOCYTES UA: NEGATIVE
Nitrite, UA: NEGATIVE
Spec Grav, UA: >=1.03
Urobilinogen, UA: 0.2
pH, UA: 5

## 2013-12-20 LAB — URINE CULTURE: Colony Count: 4000

## 2013-12-21 LAB — HEPATIC FUNCTION PANEL
ALK PHOS: 61 U/L (ref 39–117)
ALT: 24 U/L (ref 0–35)
AST: 21 U/L (ref 0–37)
Albumin: 3.8 g/dL (ref 3.5–5.2)
BILIRUBIN DIRECT: 0.1 mg/dL (ref 0.0–0.3)
TOTAL PROTEIN: 7.2 g/dL (ref 6.0–8.3)
Total Bilirubin: 1.1 mg/dL (ref 0.2–1.2)

## 2013-12-21 LAB — MICROALBUMIN / CREATININE URINE RATIO
CREATININE, U: 211.4 mg/dL
MICROALB/CREAT RATIO: 10.4 mg/g (ref 0.0–30.0)
Microalb, Ur: 22 mg/dL — ABNORMAL HIGH (ref 0.0–1.9)

## 2013-12-21 LAB — BASIC METABOLIC PANEL
BUN: 11 mg/dL (ref 6–23)
CALCIUM: 9.1 mg/dL (ref 8.4–10.5)
CHLORIDE: 110 meq/L (ref 96–112)
CO2: 27 meq/L (ref 19–32)
Creatinine, Ser: 0.7 mg/dL (ref 0.4–1.2)
GFR: 113.22 mL/min (ref >60.00)
GLUCOSE: 107 mg/dL — AB (ref 70–99)
Potassium: 4.3 mEq/L (ref 3.5–5.1)
SODIUM: 144 meq/L (ref 135–145)

## 2013-12-21 LAB — LIPID PANEL
CHOL/HDL RATIO: 3
Cholesterol: 158 mg/dL (ref 0–200)
HDL: 45.3 mg/dL (ref >39.00)
NonHDL: 112.7
Triglycerides: 201 mg/dL — ABNORMAL HIGH (ref 0.0–149.0)
VLDL: 40.2 mg/dL — ABNORMAL HIGH (ref 0.0–40.0)

## 2013-12-21 LAB — LDL CHOLESTEROL, DIRECT: Direct LDL: 78.3 mg/dL

## 2013-12-21 LAB — HEMOGLOBIN A1C: Hgb A1c MFr Bld: 7.2 % — ABNORMAL HIGH (ref 4.6–6.5)

## 2013-12-22 ENCOUNTER — Other Ambulatory Visit: Payer: Self-pay

## 2013-12-22 MED ORDER — FENOFIBRATE 160 MG PO TABS: 160.0000 mg | ORAL_TABLET | Freq: Every day | ORAL | Status: DC

## 2013-12-30 ENCOUNTER — Other Ambulatory Visit: Payer: Self-pay | Admitting: Obstetrics and Gynecology

## 2014-01-01 LAB — CYTOLOGY - PAP

## 2014-01-05 ENCOUNTER — Telehealth: Payer: Self-pay | Admitting: Family Medicine

## 2014-01-05 MED ORDER — METFORMIN HCL 1000 MG PO TABS: 1000.0000 mg | ORAL_TABLET | Freq: Two times a day (BID) | ORAL | Status: DC

## 2014-01-05 NOTE — Telephone Encounter (Signed)
Caller name: Shaunna Relation to pt: self Call back number: [phone] Pharmacy: [facility] on [address]  Reason for call:   Patient requesting a refill of metformin. Patient is out.

## 2014-01-05 NOTE — Telephone Encounter (Signed)
Rx faxed.    KP 

## 2014-01-07 ENCOUNTER — Other Ambulatory Visit: Payer: Self-pay | Admitting: Obstetrics and Gynecology

## 2014-01-13 LAB — CYTOLOGY - PAP

## 2014-01-26 ENCOUNTER — Telehealth: Payer: Self-pay | Admitting: Family Medicine

## 2014-01-26 NOTE — Telephone Encounter (Signed)
Patient is calling for a script for Xanax or something to help with Stress. She said her mother has Alzheimer's and it is stressing her out. She normally gets the Xanax for flights but would like an increased quantity to get her through this time.     KP

## 2014-01-26 NOTE — Telephone Encounter (Signed)
Caller name: michelle Relation to pt: self Call back number: [phone] Pharmacy: [facility] on [address]  Reason for call:   Patient states that she is stressed out and would like a refill of the anxiety med that has been prescribed to her in the past.

## 2014-01-26 NOTE — Telephone Encounter (Signed)
Ok to inc quantity to 30 and sig 1 po tid prn --- if more needed , she needs ov

## 2014-01-27 MED ORDER — ALPRAZOLAM 0.5 MG PO TABS: 0.5000 mg | ORAL_TABLET | Freq: Three times a day (TID) | ORAL | Status: DC | PRN

## 2014-01-27 NOTE — Telephone Encounter (Signed)
Message left advising Rx being faxed.     KP

## 2014-02-04 ENCOUNTER — Other Ambulatory Visit: Payer: Self-pay | Admitting: Family Medicine

## 2014-04-21 ENCOUNTER — Ambulatory Visit (INDEPENDENT_AMBULATORY_CARE_PROVIDER_SITE_OTHER): Payer: 59 | Admitting: Family Medicine

## 2014-04-21 ENCOUNTER — Encounter: Payer: Self-pay | Admitting: Family Medicine

## 2014-04-21 ENCOUNTER — Other Ambulatory Visit: Payer: Self-pay | Admitting: Family Medicine

## 2014-04-21 VITALS — BP 138/80 | HR 72 | Temp 98.4°F | Wt 179.2 lb

## 2014-04-21 DIAGNOSIS — E1151 Type 2 diabetes mellitus with diabetic peripheral angiopathy without gangrene: Secondary | ICD-10-CM

## 2014-04-21 DIAGNOSIS — R319 Hematuria, unspecified: Secondary | ICD-10-CM | POA: Diagnosis not present

## 2014-04-21 DIAGNOSIS — E1159 Type 2 diabetes mellitus with other circulatory complications: Secondary | ICD-10-CM | POA: Diagnosis not present

## 2014-04-21 DIAGNOSIS — E785 Hyperlipidemia, unspecified: Secondary | ICD-10-CM | POA: Diagnosis not present

## 2014-04-21 DIAGNOSIS — E1165 Type 2 diabetes mellitus with hyperglycemia: Principal | ICD-10-CM

## 2014-04-21 DIAGNOSIS — E119 Type 2 diabetes mellitus without complications: Secondary | ICD-10-CM

## 2014-04-21 DIAGNOSIS — I10 Essential (primary) hypertension: Secondary | ICD-10-CM | POA: Diagnosis not present

## 2014-04-21 DIAGNOSIS — IMO0002 Reserved for concepts with insufficient information to code with codable children: Secondary | ICD-10-CM

## 2014-04-21 LAB — POCT URINALYSIS DIPSTICK
Bilirubin, UA: NEGATIVE
Glucose, UA: NEGATIVE
Ketones, UA: NEGATIVE
Leukocytes, UA: NEGATIVE
Nitrite, UA: NEGATIVE
PH UA: 6
Spec Grav, UA: >=1.03
UROBILINOGEN UA: 0.2

## 2014-04-21 LAB — HEPATIC FUNCTION PANEL
ALBUMIN: 4.1 g/dL (ref 3.5–5.2)
ALK PHOS: 82 U/L (ref 39–117)
ALT: 24 U/L (ref 0–35)
AST: 19 U/L (ref 0–37)
BILIRUBIN DIRECT: 0.2 mg/dL (ref 0.0–0.3)
TOTAL PROTEIN: 8 g/dL (ref 6.0–8.3)
Total Bilirubin: 0.9 mg/dL (ref 0.2–1.2)

## 2014-04-21 LAB — BASIC METABOLIC PANEL
BUN: 10 mg/dL (ref 6–23)
CHLORIDE: 104 meq/L (ref 96–112)
CO2: 30 mEq/L (ref 19–32)
Calcium: 9.7 mg/dL (ref 8.4–10.5)
Creatinine, Ser: 0.67 mg/dL (ref 0.40–1.20)
GFR: 115.04 mL/min (ref >60.00)
GLUCOSE: 99 mg/dL (ref 70–99)
Potassium: 3.6 mEq/L (ref 3.5–5.1)
Sodium: 139 mEq/L (ref 135–145)

## 2014-04-21 LAB — MICROALBUMIN / CREATININE URINE RATIO
Creatinine,U: 123.5 mg/dL
MICROALB UR: 26.7 mg/dL — AB (ref 0.0–1.9)
Microalb Creat Ratio: 21.6 mg/g (ref 0.0–30.0)

## 2014-04-21 LAB — CBC WITH DIFFERENTIAL/PLATELET
Basophils Absolute: 0 10*3/uL (ref 0.0–0.1)
Basophils Relative: 0.3 % (ref 0.0–3.0)
EOS ABS: 0.2 10*3/uL (ref 0.0–0.7)
Eosinophils Relative: 1.5 % (ref 0.0–5.0)
HEMATOCRIT: 39.1 % (ref 36.0–46.0)
Hemoglobin: 12.5 g/dL (ref 12.0–15.0)
Lymphocytes Relative: 29.2 % (ref 12.0–46.0)
Lymphs Abs: 2.9 10*3/uL (ref 0.7–4.0)
MCHC: 32 g/dL (ref 30.0–36.0)
MCV: 67.4 fl — ABNORMAL LOW (ref 78.0–100.0)
MONOS PCT: 6.9 % (ref 3.0–12.0)
Monocytes Absolute: 0.7 10*3/uL (ref 0.1–1.0)
NEUTROS ABS: 6.2 10*3/uL (ref 1.4–7.7)
Neutrophils Relative %: 62.1 % (ref 43.0–77.0)
PLATELETS: 393 10*3/uL (ref 150.0–400.0)
RBC: 5.8 Mil/uL — ABNORMAL HIGH (ref 3.87–5.11)
RDW: 14.9 % (ref 11.5–15.5)
WBC: 10 10*3/uL (ref 4.0–10.5)

## 2014-04-21 LAB — LIPID PANEL
CHOLESTEROL: 175 mg/dL (ref 0–200)
HDL: 55.1 mg/dL (ref >39.00)
NonHDL: 119.9
Total CHOL/HDL Ratio: 3
Triglycerides: 201 mg/dL — ABNORMAL HIGH (ref 0.0–149.0)
VLDL: 40.2 mg/dL — ABNORMAL HIGH (ref 0.0–40.0)

## 2014-04-21 LAB — HEMOGLOBIN A1C: Hgb A1c MFr Bld: 7.2 % — ABNORMAL HIGH (ref 4.6–6.5)

## 2014-04-21 LAB — LDL CHOLESTEROL, DIRECT: LDL DIRECT: 88 mg/dL

## 2014-04-21 MED ORDER — METFORMIN HCL 1000 MG PO TABS: 1000.0000 mg | ORAL_TABLET | Freq: Two times a day (BID) | ORAL | Status: DC

## 2014-04-21 NOTE — Patient Instructions (Signed)

## 2014-04-21 NOTE — Assessment & Plan Note (Signed)
Stable  con't norvasc 

## 2014-04-21 NOTE — Progress Notes (Signed)
Patient ID: Megan [name], female    DOB: 1955-06-04  Age: 59 y.o. MRN: [id number]    Subjective:  Subjective HPI Megan [name] presents for f/u dm, cholesterol and htn.  She also c/o insomnia.  She goes to bed at 11p and gets up at 6am.     HPI HYPERTENSION  Blood pressure range-not checking   Chest pain- no       Dyspnea- no Lightheadedness- no   Edema- no Other side effects - no   Medication compliance: good  Low salt diet- no  DIABETES  Blood Sugar ranges-running high  Polyuria- no New Visual problems- no Hypoglycemic symptoms- no Other side effects-no Medication compliance - good Last eye exam- recent Foot exam- today  HYPERLIPIDEMIA  Medication compliance- no RUQ pain- no  Muscle aches- no Other side effects-no   Review of Systems  Constitutional: Negative for diaphoresis, appetite change, fatigue and unexpected weight change.  Eyes: Negative for pain, redness and visual disturbance.  Respiratory: Negative for cough, chest tightness, shortness of breath and wheezing.   Cardiovascular: Negative for chest pain, palpitations and leg swelling.  Endocrine: Negative for cold intolerance, heat intolerance, polydipsia, polyphagia and polyuria.  Genitourinary: Negative for dysuria, frequency and difficulty urinating.  Neurological: Negative for dizziness, light-headedness, numbness and headaches.  Psychiatric/Behavioral: Negative for behavioral problems, decreased concentration and agitation. The patient is not nervous/anxious.     History Past Medical History  Diagnosis Date  . Hypertension   . Diabetes mellitus   . Allergy   . Hyperlipidemia   . Infectious colitis   . Diverticulosis   . Colon polyp   . Hemorrhoids   . Ventral hernia   . GERD (gastroesophageal reflux disease)     occasionally  . H/O hiatal hernia     ?  Marland Kitchen Arthritis     fingers    She has past surgical history that includes Inguinal hernia repair; Hemorrhoid surgery; Ventral  hernia repair; Foot surgery; colonscopy; Diagnostic laparoscopy; Incisional hernia repair (01/20/2011); and Hernia repair (01/20/2011).   Her family history includes Breast cancer in her mother; Cancer in her mother, sister, and another family member; Diabetes in her brother, mother, and another family member; Heart disease in her father; Throat cancer in her maternal aunt.She reports that she quit smoking about 18 years ago. Her smoking use included Cigarettes. She has a 20 pack-year smoking history. She has never used smokeless tobacco. She reports that she drinks about 1.8 oz of alcohol per week. She reports that she does not use illicit drugs.  Current Outpatient Prescriptions on File Prior to Visit  Medication Sig Dispense Refill  . albuterol (PROVENTIL HFA;VENTOLIN HFA) 108 (90 BASE) MCG/ACT inhaler Inhale 2 puffs into the lungs every 6 (six) hours as needed for wheezing or shortness of breath. 1 Inhaler 0  . ALPRAZolam (XANAX) 0.5 MG tablet Take 1 tablet (0.5 mg total) by mouth 3 (three) times daily as needed for anxiety. 1 po before flying 30 tablet 0  . amLODipine (NORVASC) 5 MG tablet TAKE 1 TABLET BY MOUTH EVERY DAY 90 tablet 1  . beclomethasone (QVAR) 40 MCG/ACT inhaler Inhale 2 puffs into the lungs 2 (two) times daily. 1 Inhaler 12  . Biotin 1000 MCG tablet Take 1,000 mcg by mouth 3 (three) times daily.    . Fish Oil-Cholecalciferol (FISH OIL + D3 PO) Take 1 capsule by mouth daily.    . fluticasone (FLONASE) 50 MCG/ACT nasal spray Place 2 sprays into both nostrils daily. [place]  g 6  . FREESTYLE INSULINX TEST test strip Check blood sugar daily 100 each 12  . glucose blood (FREESTYLE LITE) test strip Use as instructed 100 each 12  . Lancets (FREESTYLE) lancets Use as instructed 100 each 12  . Multiple Vitamin (MULTIVITAMIN) tablet Take 1 tablet by mouth daily.     . vitamin B-12 (CYANOCOBALAMIN) 500 MCG tablet Take 1,000 mcg by mouth daily.     . fenofibrate 160 MG tablet Take 1 tablet (160 mg  total) by mouth daily. (Patient not taking: Reported on 04/21/2014) 30 tablet 2   No current facility-administered medications on file prior to visit.     Objective:  Objective Physical Exam  Constitutional: She is oriented to person, place, and time. She appears well-developed and well-nourished. No distress.  HENT:  Right Ear: External ear normal.  Left Ear: External ear normal.  Nose: Nose normal.  Mouth/Throat: Oropharynx is clear and moist.  Eyes: EOM are normal. Pupils are equal, round, and reactive to light.  Neck: Normal range of motion. Neck supple.  Cardiovascular: Normal rate, regular rhythm and normal heart sounds.   No murmur heard. Pulmonary/Chest: Effort normal and breath sounds normal. No respiratory distress. She has no wheezes. She has no rales. She exhibits no tenderness.  Neurological: She is alert and oriented to person, place, and time.  Psychiatric: She has a normal mood and affect. Her behavior is normal. Judgment and thought content normal.  Sensory exam of the foot is normal, tested with the monofilament. Good pulses, no lesions or ulcers, good peripheral pulses.  BP 138/80 mmHg  Pulse 72  Temp(Src) 98.4 F (36.9 C) (Oral)  Wt 179 lb 3.2 oz (81.285 kg)  SpO2 98% Wt Readings from Last 3 Encounters:  04/21/14 179 lb 3.2 oz (81.285 kg)  11/24/13 181 lb (82.101 kg)  10/17/13 175 lb 6.4 oz (79.561 kg)     Lab Results  Component Value Date   WBC 10.0 04/21/2014   HGB 12.5 04/21/2014   HCT 39.1 04/21/2014   PLT 393.0 04/21/2014   GLUCOSE 99 04/21/2014   CHOL 175 04/21/2014   TRIG 201.0* 04/21/2014   HDL 55.10 04/21/2014   LDLDIRECT 88.0 04/21/2014   LDLCALC 85 07/01/2013   ALT 24 04/21/2014   AST 19 04/21/2014   NA 139 04/21/2014   K 3.6 04/21/2014   CL 104 04/21/2014   CREATININE 0.67 04/21/2014   BUN 10 04/21/2014   CO2 30 04/21/2014   TSH 0.69 07/01/2013   HGBA1C 7.2* 04/21/2014   MICROALBUR 26.7* 04/21/2014    Dg Chest 2  View  10/20/2013   CLINICAL DATA:  Recent pneumonia with clinical improvement  EXAM: CHEST  2 VIEW  COMPARISON:  October 13, 2013 and October 06, 2013  FINDINGS: Lungs are now clear. Heart size and pulmonary vascularity are normal. No adenopathy. No bone lesions. There is mild leftward deviation of the trachea at the cervical -thoracic junction.  IMPRESSION: Lungs clear. Deviation of the trachea toward the left at the cervical -thoracic junction. This finding may indicate thyroid enlargement. Thyroid ultrasound may be helpful for further assessment in this regard.   Electronically Signed   By: Lowella Grip M.D.   On: 10/20/2013 08:11     Assessment & Plan:  Plan I have discontinued Ms. Bollig's meloxicam and metFORMIN. I am also having her maintain her vitamin B-12, glucose blood, freestyle, multivitamin, Biotin, FREESTYLE INSULINX TEST, Fish Oil-Cholecalciferol (FISH OIL + D3 PO), fluticasone, albuterol, beclomethasone, fenofibrate, ALPRAZolam, and  amLODipine.  Meds ordered this encounter  Medications  . DISCONTD: metFORMIN (GLUCOPHAGE) 1000 MG tablet    Sig: Take 1 tablet (1,000 mg total) by mouth 2 (two) times daily with a meal.    Dispense:  60 tablet    Refill:  5    D/C PREVIOUS SCRIPTS FOR THIS MEDICATION    Problem List Items Addressed This Visit    Diabetes mellitus type II, controlled    Check labs con't check ing glucose      Essential hypertension    Stable con't norvasc      Relevant Orders   Basic metabolic panel (Completed)   CBC with Differential/Platelet (Completed)   Microalbumin / creatinine urine ratio (Completed)   POCT urinalysis dipstick (Completed)   Hyperlipidemia    con't fenofibrate Pt never took statin-- she was concerned about possibility or side effects       Other Visit Diagnoses    DM (diabetes mellitus) type II uncontrolled, periph vascular disorder    -  Primary    Relevant Orders    Hemoglobin A1c (Completed)    Hyperlipidemia  LDL goal <70        Relevant Orders    Hepatic function panel (Completed)    Lipid panel (Completed)    Hematuria        Relevant Orders    Urine Culture       Follow-up: Return in about 6 months (around 10/21/2014), or if symptoms worsen or fail to improve, for f/u and labs.  Garnet Koyanagi, DO

## 2014-04-21 NOTE — Assessment & Plan Note (Signed)
con't fenofibrate Pt never took statin-- she was concerned about possibility or side effects

## 2014-04-21 NOTE — Progress Notes (Signed)
Pre visit review using our clinic review tool, if applicable. No additional management support is needed unless otherwise documented below in the visit note. 

## 2014-04-21 NOTE — Assessment & Plan Note (Signed)
Check labs con't check ing glucose

## 2014-04-22 LAB — URINE CULTURE
Colony Count: NO GROWTH
ORGANISM ID, BACTERIA: NO GROWTH

## 2014-04-28 ENCOUNTER — Telehealth: Payer: Self-pay

## 2014-04-28 DIAGNOSIS — R809 Proteinuria, unspecified: Secondary | ICD-10-CM

## 2014-04-28 MED ORDER — SITAGLIP PHOS-METFORMIN HCL ER 100-1000 MG PO TB24: 1.0000 | ORAL_TABLET | Freq: Every day | ORAL | Status: DC

## 2014-04-28 NOTE — Telephone Encounter (Signed)
-----   Message from Rosalita Chessman, DO sent at 04/26/2014 11:12 PM EDT ----- DM not controlled---- stop metformin and start janumet xr 100/1000 1 po qd #30  2 refills Inc microalbumin--   Check 24 hour urine for protein Cholesterol--- LDL goal < 70,  HDL >40,  TG < 150.  Diet and exercise will increase HDL and decrease LDL and TG.  Fish,  Fish Oil, Flaxseed oil will also help increase the HDL and decrease Triglycerides.   Recheck labs in 3 months Lipid, hep, bmp, hgba1c----DM, hyperipidemia.  Marland Kitchen

## 2014-04-28 NOTE — Telephone Encounter (Signed)
Spoke with patient and she verbalized understanding, the Rx has been faxed and the order for 24 hour urine has been placed. Copy mailed.    KP

## 2014-05-15 NOTE — Telephone Encounter (Signed)
error:[id number] ° °

## 2014-05-25 ENCOUNTER — Ambulatory Visit: Payer: 59 | Admitting: Family Medicine

## 2014-08-12 ENCOUNTER — Other Ambulatory Visit: Payer: Self-pay | Admitting: Family Medicine

## 2014-08-12 NOTE — Telephone Encounter (Signed)
90 day supply amlodipine sent to pharmacy. Pt is due for follow up in October with Dr Etter Sjogren.  Please call pt to arrange appt.

## 2014-08-12 NOTE — Telephone Encounter (Signed)
Per pt req scheduled cpe in October.

## 2014-09-16 ENCOUNTER — Other Ambulatory Visit: Payer: Self-pay | Admitting: Family Medicine

## 2014-09-17 NOTE — Telephone Encounter (Signed)
Last seen 04/21/14 and filled 01/27/14 #30  Please advise     KP

## 2014-09-18 ENCOUNTER — Other Ambulatory Visit: Payer: Self-pay | Admitting: Family Medicine

## 2014-09-22 NOTE — Telephone Encounter (Signed)
Last seen 04/21/14 and filled 09/17/14 #30   Please advise     KP

## 2014-11-02 ENCOUNTER — Encounter: Payer: Self-pay | Admitting: Family Medicine

## 2014-11-02 ENCOUNTER — Ambulatory Visit (INDEPENDENT_AMBULATORY_CARE_PROVIDER_SITE_OTHER): Payer: Commercial Managed Care - HMO | Admitting: Family Medicine

## 2014-11-02 VITALS — BP 132/82 | HR 77 | Temp 98.5°F | Ht 65.0 in | Wt 177.8 lb

## 2014-11-02 DIAGNOSIS — Z1159 Encounter for screening for other viral diseases: Secondary | ICD-10-CM

## 2014-11-02 DIAGNOSIS — Z0000 Encounter for general adult medical examination without abnormal findings: Secondary | ICD-10-CM | POA: Diagnosis not present

## 2014-11-02 DIAGNOSIS — J302 Other seasonal allergic rhinitis: Secondary | ICD-10-CM

## 2014-11-02 DIAGNOSIS — E1151 Type 2 diabetes mellitus with diabetic peripheral angiopathy without gangrene: Secondary | ICD-10-CM | POA: Diagnosis not present

## 2014-11-02 DIAGNOSIS — E785 Hyperlipidemia, unspecified: Secondary | ICD-10-CM

## 2014-11-02 DIAGNOSIS — I10 Essential (primary) hypertension: Secondary | ICD-10-CM | POA: Diagnosis not present

## 2014-11-02 DIAGNOSIS — R319 Hematuria, unspecified: Secondary | ICD-10-CM | POA: Diagnosis not present

## 2014-11-02 DIAGNOSIS — J0110 Acute frontal sinusitis, unspecified: Secondary | ICD-10-CM

## 2014-11-02 LAB — COMPREHENSIVE METABOLIC PANEL
ALBUMIN: 4.1 g/dL (ref 3.5–5.2)
ALK PHOS: 74 U/L (ref 39–117)
ALT: 21 U/L (ref 0–35)
AST: 20 U/L (ref 0–37)
BILIRUBIN TOTAL: 0.8 mg/dL (ref 0.2–1.2)
BUN: 9 mg/dL (ref 6–23)
CALCIUM: 9.5 mg/dL (ref 8.4–10.5)
CHLORIDE: 103 meq/L (ref 96–112)
CO2: 31 mEq/L (ref 19–32)
Creatinine, Ser: 0.63 mg/dL (ref 0.40–1.20)
GFR: 123.3 mL/min (ref >60.00)
Glucose, Bld: 122 mg/dL — ABNORMAL HIGH (ref 70–99)
Potassium: 3.8 mEq/L (ref 3.5–5.1)
Sodium: 142 mEq/L (ref 135–145)
Total Protein: 7.8 g/dL (ref 6.0–8.3)

## 2014-11-02 LAB — CBC WITH DIFFERENTIAL/PLATELET
BASOS ABS: 0 10*3/uL (ref 0.0–0.1)
BASOS PCT: 0.4 % (ref 0.0–3.0)
Eosinophils Absolute: 0.1 10*3/uL (ref 0.0–0.7)
Eosinophils Relative: 1.1 % (ref 0.0–5.0)
HEMATOCRIT: 38.8 % (ref 36.0–46.0)
HEMOGLOBIN: 12.4 g/dL (ref 12.0–15.0)
LYMPHS PCT: 25.4 % (ref 12.0–46.0)
Lymphs Abs: 2.7 10*3/uL (ref 0.7–4.0)
MCHC: 32 g/dL (ref 30.0–36.0)
MCV: 68.1 fl — ABNORMAL LOW (ref 78.0–100.0)
MONO ABS: 0.8 10*3/uL (ref 0.1–1.0)
Monocytes Relative: 7.4 % (ref 3.0–12.0)
Neutro Abs: 7.1 10*3/uL (ref 1.4–7.7)
Neutrophils Relative %: 65.7 % (ref 43.0–77.0)
Platelets: 410 10*3/uL — ABNORMAL HIGH (ref 150.0–400.0)
RBC: 5.7 Mil/uL — AB (ref 3.87–5.11)
RDW: 15.3 % (ref 11.5–15.5)
WBC: 10.8 10*3/uL — AB (ref 4.0–10.5)

## 2014-11-02 LAB — LIPID PANEL
CHOLESTEROL: 174 mg/dL (ref 0–200)
HDL: 60.1 mg/dL (ref >39.00)
LDL CALC: 85 mg/dL (ref 0–99)
NonHDL: 113.84
Total CHOL/HDL Ratio: 3
Triglycerides: 144 mg/dL (ref 0.0–149.0)
VLDL: 28.8 mg/dL (ref 0.0–40.0)

## 2014-11-02 LAB — POCT URINALYSIS DIPSTICK
Bilirubin, UA: NEGATIVE
Glucose, UA: NEGATIVE
Ketones, UA: NEGATIVE
LEUKOCYTES UA: NEGATIVE
Nitrite, UA: NEGATIVE
Spec Grav, UA: 1.025
Urobilinogen, UA: 0.2
pH, UA: 6

## 2014-11-02 LAB — HEMOGLOBIN A1C: HEMOGLOBIN A1C: 6.8 % — AB (ref 4.6–6.5)

## 2014-11-02 LAB — MICROALBUMIN / CREATININE URINE RATIO
Creatinine,U: 206.5 mg/dL
MICROALB/CREAT RATIO: 31.4 mg/g — AB (ref 0.0–30.0)
Microalb, Ur: 64.8 mg/dL — ABNORMAL HIGH (ref 0.0–1.9)

## 2014-11-02 LAB — TSH: TSH: 0.89 u[IU]/mL (ref 0.35–4.50)

## 2014-11-02 MED ORDER — FLUTICASONE PROPIONATE 50 MCG/ACT NA SUSP: 2.0000 | Freq: Every day | NASAL | Status: DC

## 2014-11-02 MED ORDER — AMOXICILLIN-POT CLAVULANATE 875-125 MG PO TABS: 1.0000 | ORAL_TABLET | Freq: Two times a day (BID) | ORAL | Status: DC

## 2014-11-02 MED ORDER — FLUCONAZOLE 150 MG PO TABS: ORAL_TABLET | ORAL | Status: DC

## 2014-11-02 NOTE — Progress Notes (Signed)
Subjective:     Megan [name] is a 59 y.o. female and is here for a comprehensive physical exam. The patient reports problems - sinus congestion x 2-3 weeks .  Social History   Social History  . Marital Status: Divorced    Spouse Name: N/A  . Number of Children: N/A  . Years of Education: N/A   Occupational History  . Not on file.   Social History Main Topics  . Smoking status: Former Smoker -- 1.00 packs/day for 20 years    Types: Cigarettes    Quit date: 01/05/1996  . Smokeless tobacco: Never Used  . Alcohol Use: 1.8 oz/week    2 Glasses of wine, 1 Cans of beer per week  . Drug Use: No  . Sexual Activity: Not on file   Other Topics Concern  . Not on file   Social History Narrative   Health Maintenance  Topic Date Due  . Hepatitis C Screening  09-27-1955  . HIV Screening  03/19/1968  . MAMMOGRAM  11/16/2013  . HEMOGLOBIN A1C  10/21/2014  . INFLUENZA VACCINE  11/02/2015 (Originally 08/17/2014)  . FOOT EXAM  04/21/2015  . URINE MICROALBUMIN  04/21/2015  . OPHTHALMOLOGY EXAM  10/03/2015  . PAP SMEAR  01/07/2017  . PNEUMOCOCCAL POLYSACCHARIDE VACCINE (2) 07/02/2018  . COLONOSCOPY  11/18/2019  . TETANUS/TDAP  05/04/2022  . ZOSTAVAX  Completed    The following portions of the patient's history were reviewed and updated as appropriate:  She  has a past medical history of Hypertension; Diabetes mellitus; Allergy; Hyperlipidemia; Infectious colitis; Diverticulosis; Colon polyp; Hemorrhoids; Ventral hernia; GERD (gastroesophageal reflux disease); H/O hiatal hernia; and Arthritis. She  does not have any pertinent problems on file. She  has past surgical history that includes Inguinal hernia repair; Hemorrhoid surgery; Ventral hernia repair; Foot surgery; colonscopy; Diagnostic laparoscopy; Incisional hernia repair (01/20/2011); and Hernia repair (01/20/2011). Her family history includes Breast cancer in her mother; Cancer in her mother, sister, and another family member;  Diabetes in her brother, mother, and another family member; Heart disease in her father; Throat cancer in her maternal aunt. She  reports that she quit smoking about 18 years ago. Her smoking use included Cigarettes. She has a 20 pack-year smoking history. She has never used smokeless tobacco. She reports that she drinks about 1.8 oz of alcohol per week. She reports that she does not use illicit drugs. She has a current medication list which includes the following prescription(s): albuterol, alprazolam, amlodipine, beclomethasone, biotin, fish oil-cholecalciferol, fluticasone, freestyle insulinx test, glucose blood, freestyle, metformin, multivitamin, sitagliptin-metformin hcl, vitamin b-12, amoxicillin-clavulanate, and fluconazole. Current Outpatient Prescriptions on File Prior to Visit  Medication Sig Dispense Refill  . albuterol (PROVENTIL HFA;VENTOLIN HFA) 108 (90 BASE) MCG/ACT inhaler Inhale 2 puffs into the lungs every 6 (six) hours as needed for wheezing or shortness of breath. 1 Inhaler 0  . ALPRAZolam (XANAX) 0.5 MG tablet TAKE ONE TABLET BY MOUTH THREE TIMES DAILY AS NEEDED FOR ANXIETY. TAKE ONE BEFORE FLYING 30 tablet 0  . amLODipine (NORVASC) 5 MG tablet TAKE 1 TABLET BY MOUTH DAILY 90 tablet 0  . beclomethasone (QVAR) 40 MCG/ACT inhaler Inhale 2 puffs into the lungs 2 (two) times daily. 1 Inhaler 12  . Biotin 1000 MCG tablet Take 1,000 mcg by mouth 3 (three) times daily.    . Fish Oil-Cholecalciferol (FISH OIL + D3 PO) Take 1 capsule by mouth daily.    Marland Kitchen FREESTYLE INSULINX TEST test strip Check blood sugar daily 100  each 12  . glucose blood (FREESTYLE LITE) test strip Use as instructed 100 each 12  . Lancets (FREESTYLE) lancets Use as instructed 100 each 12  . Multiple Vitamin (MULTIVITAMIN) tablet Take 1 tablet by mouth daily.     . SitaGLIPtin-MetFORMIN HCl [phone] MG TB24 Take 1 tablet by mouth daily. 30 tablet 2  . vitamin B-12 (CYANOCOBALAMIN) 500 MCG tablet Take 1,000 mcg by mouth  daily.      No current facility-administered medications on file prior to visit.   She is allergic to aspirin..  Review of Systems Review of Systems  Constitutional: Negative for activity change, appetite change and fatigue.  HENT: Negative for hearing loss, congestion, tinnitus and ear discharge.  dentist q56mEyes: Negative for visual disturbance (see optho q1y -- vision corrected to 20/20 with glasses).  Respiratory: Negative for cough, chest tightness and shortness of breath.   Cardiovascular: Negative for chest pain, palpitations and leg swelling.  Gastrointestinal: Negative for abdominal pain, diarrhea, constipation and abdominal distention.  Genitourinary: Negative for urgency, frequency, decreased urine volume and difficulty urinating.  Musculoskeletal: Negative for back pain, arthralgias and gait problem.  Skin: Negative for color change, pallor and rash.  Neurological: Negative for dizziness, light-headedness, numbness and headaches.  Hematological: Negative for adenopathy. Does not bruise/bleed easily.  Psychiatric/Behavioral: Negative for suicidal ideas, confusion, sleep disturbance, self-injury, dysphoric mood, decreased concentration and agitation.       Objective:    BP 132/82 mmHg  Pulse 77  Temp(Src) 98.5 F (36.9 C) (Oral)  Ht _0  (1.651 m)  Wt 177 lb 12.8 oz (80.65 kg)  BMI 29.59 kg/m2  SpO2 98% General appearance: alert, cooperative, appears stated age and no distress Head: Normocephalic, without obvious abnormality, atraumatic Eyes: conjunctivae/corneas clear. PERRL, EOM's intact. Fundi benign. Ears: normal TM's and external ear canals both ears Nose: green discharge, moderate congestion, turbinates red, swollen, sinus tenderness bilateral Throat: lips, mucosa, and tongue normal; teeth and gums normal Neck: moderate anterior cervical adenopathy, no carotid bruit, no JVD, supple, symmetrical, trachea midline and thyroid not enlarged, symmetric, no  tenderness/mass/nodules Back: symmetric, no curvature. ROM normal. No CVA tenderness. Lungs: clear to auscultation bilaterally Breasts: gyn Heart: regular rate and rhythm, S1, S2 normal, no murmur, click, rub or gallop Abdomen: soft, non-tender; bowel sounds normal; no masses,  no organomegaly Pelvic: deferred --gyn Extremities: extremities normal, atraumatic, no cyanosis or edema Pulses: 2+ and symmetric Skin: Skin color, texture, turgor normal. No rashes or lesions Lymph nodes: Cervical, supraclavicular, and axillary nodes normal. Neurologic: Alert and oriented X 3, normal strength and tone. Normal symmetric reflexes. Normal coordination and gait Psych- no depression, no anxiety      Assessment:    Healthy female exam.      Plan:    ghm utd Check labs See After Visit Summary for Counseling Recommendations    1. Preventative health care   - CBC with Differential/Platelet - TSH - HIV antibody  2. Seasonal allergies   - fluticasone (FLONASE) 50 MCG/ACT nasal spray; Place 2 sprays into both nostrils daily.  Dispense: 16 g; Refill: 6  3. DM (diabetes mellitus) type II controlled peripheral vascular disorder (HCC) Check labs Sugars ok per pt - Comp Met (CMET) - CBC with Differential/Platelet - Hemoglobin A1c - Lipid panel - Microalbumin / creatinine urine ratio - POCT urinalysis dipstick  4. Essential hypertension Stable con't norvasc - Comp Met (CMET) - CBC with Differential/Platelet - Microalbumin / creatinine urine ratio - POCT urinalysis dipstick  5. Hyperlipidemia  Check labs - Comp Met (CMET) - CBC with Differential/Platelet - Lipid panel - Microalbumin / creatinine urine ratio - POCT urinalysis dipstick  6. Acute frontal sinusitis, recurrence not specified   - amoxicillin-clavulanate (AUGMENTIN) 875-125 MG tablet; Take 1 tablet by mouth 2 (two) times daily.  Dispense: 20 tablet; Refill: 0 - fluconazole (DIFLUCAN) 150 MG tablet; 1 po qd x1, may  repeat 3 days prn  Dispense: 2 tablet; Refill: 0  7. Need for hepatitis C screening test   - Hepatitis C antibody

## 2014-11-02 NOTE — Addendum Note (Signed)
Addended by: Caffie Pinto on: 11/02/2014 10:31 AM   Modules accepted: Orders

## 2014-11-02 NOTE — Patient Instructions (Signed)
Preventive Care for Adults, Female A healthy lifestyle and preventive care can promote health and wellness. Preventive health guidelines for women include the following key practices.  A routine yearly physical is a good way to check with your health care provider about your health and preventive screening. It is a chance to share any concerns and updates on your health and to receive a thorough exam.  Visit your dentist for a routine exam and preventive care every 6 months. Brush your teeth twice a day and floss once a day. Good oral hygiene prevents tooth decay and gum disease.  The frequency of eye exams is based on your age, health, family medical history, use of contact lenses, and other factors. Follow your health care provider's recommendations for frequency of eye exams.  Eat a healthy diet. Foods like vegetables, fruits, whole grains, low-fat dairy products, and lean protein foods contain the nutrients you need without too many calories. Decrease your intake of foods high in solid fats, added sugars, and salt. Eat the right amount of calories for you.Get information about a proper diet from your health care provider, if necessary.  Regular physical exercise is one of the most important things you can do for your health. Most adults should get at least 150 minutes of moderate-intensity exercise (any activity that increases your heart rate and causes you to sweat) each week. In addition, most adults need muscle-strengthening exercises on 2 or more days a week.  Maintain a healthy weight. The body mass index (BMI) is a screening tool to identify possible weight problems. It provides an estimate of body fat based on height and weight. Your health care provider can find your BMI and can help you achieve or maintain a healthy weight.For adults 20 years and older:  A BMI below 18.5 is considered underweight.  A BMI of 18.5 to 24.9 is normal.  A BMI of 25 to 29.9 is considered overweight.  A  BMI of 30 and above is considered obese.  Maintain normal blood lipids and cholesterol levels by exercising and minimizing your intake of saturated fat. Eat a balanced diet with plenty of fruit and vegetables. Blood tests for lipids and cholesterol should begin at age 45 and be repeated every 5 years. If your lipid or cholesterol levels are high, you are over 50, or you are at high risk for heart disease, you may need your cholesterol levels checked more frequently.Ongoing high lipid and cholesterol levels should be treated with medicines if diet and exercise are not working.  If you smoke, find out from your health care provider how to quit. If you do not use tobacco, do not start.  Lung cancer screening is recommended for adults aged 45-80 years who are at high risk for developing lung cancer because of a history of smoking. A yearly low-dose CT scan of the lungs is recommended for people who have at least a 30-pack-year history of smoking and are a current smoker or have quit within the past 15 years. A pack year of smoking is smoking an average of 1 pack of cigarettes a day for 1 year (for example: 1 pack a day for 30 years or 2 packs a day for 15 years). Yearly screening should continue until the smoker has stopped smoking for at least 15 years. Yearly screening should be stopped for people who develop a health problem that would prevent them from having lung cancer treatment.  If you are pregnant, do not drink alcohol. If you are  breastfeeding, be very cautious about drinking alcohol. If you are not pregnant and choose to drink alcohol, do not have more than 1 drink per day. One drink is considered to be 12 ounces (355 mL) of beer, 5 ounces (148 mL) of wine, or 1.5 ounces (44 mL) of liquor.  Avoid use of street drugs. Do not share needles with anyone. Ask for help if you need support or instructions about stopping the use of drugs.  High blood pressure causes heart disease and increases the risk  of stroke. Your blood pressure should be checked at least every 1 to 2 years. Ongoing high blood pressure should be treated with medicines if weight loss and exercise do not work.  If you are 55-79 years old, ask your health care provider if you should take aspirin to prevent strokes.  Diabetes screening is done by taking a blood sample to check your blood glucose level after you have not eaten for a certain period of time (fasting). If you are not overweight and you do not have risk factors for diabetes, you should be screened once every 3 years starting at age 45. If you are overweight or obese and you are 40-70 years of age, you should be screened for diabetes every year as part of your cardiovascular risk assessment.  Breast cancer screening is essential preventive care for women. You should practice "breast self-awareness." This means understanding the normal appearance and feel of your breasts and may include breast self-examination. Any changes detected, no matter how small, should be reported to a health care provider. Women in their 20s and 30s should have a clinical breast exam (CBE) by a health care provider as part of a regular health exam every 1 to 3 years. After age 40, women should have a CBE every year. Starting at age 40, women should consider having a mammogram (breast X-ray test) every year. Women who have a family history of breast cancer should talk to their health care provider about genetic screening. Women at a high risk of breast cancer should talk to their health care providers about having an MRI and a mammogram every year.  Breast cancer gene (BRCA)-related cancer risk assessment is recommended for women who have family members with BRCA-related cancers. BRCA-related cancers include breast, ovarian, tubal, and peritoneal cancers. Having family members with these cancers may be associated with an increased risk for harmful changes (mutations) in the breast cancer genes BRCA1 and  BRCA2. Results of the assessment will determine the need for genetic counseling and BRCA1 and BRCA2 testing.  Your health care provider may recommend that you be screened regularly for cancer of the pelvic organs (ovaries, uterus, and vagina). This screening involves a pelvic examination, including checking for microscopic changes to the surface of your cervix (Pap test). You may be encouraged to have this screening done every 3 years, beginning at age 21.  For women ages 30-65, health care providers may recommend pelvic exams and Pap testing every 3 years, or they may recommend the Pap and pelvic exam, combined with testing for human papilloma virus (HPV), every 5 years. Some types of HPV increase your risk of cervical cancer. Testing for HPV may also be done on women of any age with unclear Pap test results.  Other health care providers may not recommend any screening for nonpregnant women who are considered low risk for pelvic cancer and who do not have symptoms. Ask your health care provider if a screening pelvic exam is right for   you.  If you have had past treatment for cervical cancer or a condition that could lead to cancer, you need Pap tests and screening for cancer for at least 20 years after your treatment. If Pap tests have been discontinued, your risk factors (such as having a new sexual partner) need to be reassessed to determine if screening should resume. Some women have medical problems that increase the chance of getting cervical cancer. In these cases, your health care provider may recommend more frequent screening and Pap tests.  Colorectal cancer can be detected and often prevented. Most routine colorectal cancer screening begins at the age of 50 years and continues through age 75 years. However, your health care provider may recommend screening at an earlier age if you have risk factors for colon cancer. On a yearly basis, your health care provider may provide home test kits to check  for hidden blood in the stool. Use of a small camera at the end of a tube, to directly examine the colon (sigmoidoscopy or colonoscopy), can detect the earliest forms of colorectal cancer. Talk to your health care provider about this at age 50, when routine screening begins. Direct exam of the colon should be repeated every 5-10 years through age 75 years, unless early forms of precancerous polyps or small growths are found.  People who are at an increased risk for hepatitis B should be screened for this virus. You are considered at high risk for hepatitis B if:  You were born in a country where hepatitis B occurs often. Talk with your health care provider about which countries are considered high risk.  Your parents were born in a high-risk country and you have not received a shot to protect against hepatitis B (hepatitis B vaccine).  You have HIV or AIDS.  You use needles to inject street drugs.  You live with, or have sex with, someone who has hepatitis B.  You get hemodialysis treatment.  You take certain medicines for conditions like cancer, organ transplantation, and autoimmune conditions.  Hepatitis C blood testing is recommended for all people born from 1945 through 1965 and any individual with known risks for hepatitis C.  Practice safe sex. Use condoms and avoid high-risk sexual practices to reduce the spread of sexually transmitted infections (STIs). STIs include gonorrhea, chlamydia, syphilis, trichomonas, herpes, HPV, and human immunodeficiency virus (HIV). Herpes, HIV, and HPV are viral illnesses that have no cure. They can result in disability, cancer, and death.  You should be screened for sexually transmitted illnesses (STIs) including gonorrhea and chlamydia if:  You are sexually active and are younger than 24 years.  You are older than 24 years and your health care provider tells you that you are at risk for this type of infection.  Your sexual activity has changed  since you were last screened and you are at an increased risk for chlamydia or gonorrhea. Ask your health care provider if you are at risk.  If you are at risk of being infected with HIV, it is recommended that you take a prescription medicine daily to prevent HIV infection. This is called preexposure prophylaxis (PrEP). You are considered at risk if:  You are sexually active and do not regularly use condoms or know the HIV status of your partner(s).  You take drugs by injection.  You are sexually active with a partner who has HIV.  Talk with your health care provider about whether you are at high risk of being infected with HIV. If   you choose to begin PrEP, you should first be tested for HIV. You should then be tested every 3 months for as long as you are taking PrEP.  Osteoporosis is a disease in which the bones lose minerals and strength with aging. This can result in serious bone fractures or breaks. The risk of osteoporosis can be identified using a bone density scan. Women ages 67 years and over and women at risk for fractures or osteoporosis should discuss screening with their health care providers. Ask your health care provider whether you should take a calcium supplement or vitamin D to reduce the rate of osteoporosis.  Menopause can be associated with physical symptoms and risks. Hormone replacement therapy is available to decrease symptoms and risks. You should talk to your health care provider about whether hormone replacement therapy is right for you.  Use sunscreen. Apply sunscreen liberally and repeatedly throughout the day. You should seek shade when your shadow is shorter than you. Protect yourself by wearing long sleeves, pants, a wide-brimmed hat, and sunglasses year round, whenever you are outdoors.  Once a month, do a whole body skin exam, using a mirror to look at the skin on your back. Tell your health care provider of new moles, moles that have irregular borders, moles that  are larger than a pencil eraser, or moles that have changed in shape or color.  Stay current with required vaccines (immunizations).  Influenza vaccine. All adults should be immunized every year.  Tetanus, diphtheria, and acellular pertussis (Td, Tdap) vaccine. Pregnant women should receive 1 dose of Tdap vaccine during each pregnancy. The dose should be obtained regardless of the length of time since the last dose. Immunization is preferred during the 27th-36th week of gestation. An adult who has not previously received Tdap or who does not know her vaccine status should receive 1 dose of Tdap. This initial dose should be followed by tetanus and diphtheria toxoids (Td) booster doses every 10 years. Adults with an unknown or incomplete history of completing a 3-dose immunization series with Td-containing vaccines should begin or complete a primary immunization series including a Tdap dose. Adults should receive a Td booster every 10 years.  Varicella vaccine. An adult without evidence of immunity to varicella should receive 2 doses or a second dose if she has previously received 1 dose. Pregnant females who do not have evidence of immunity should receive the first dose after pregnancy. This first dose should be obtained before leaving the health care facility. The second dose should be obtained 4-8 weeks after the first dose.  Human papillomavirus (HPV) vaccine. Females aged 13-26 years who have not received the vaccine previously should obtain the 3-dose series. The vaccine is not recommended for use in pregnant females. However, pregnancy testing is not needed before receiving a dose. If a female is found to be pregnant after receiving a dose, no treatment is needed. In that case, the remaining doses should be delayed until after the pregnancy. Immunization is recommended for any person with an immunocompromised condition through the age of 61 years if she did not get any or all doses earlier. During the  3-dose series, the second dose should be obtained 4-8 weeks after the first dose. The third dose should be obtained 24 weeks after the first dose and 16 weeks after the second dose.  Zoster vaccine. One dose is recommended for adults aged 30 years or older unless certain conditions are present.  Measles, mumps, and rubella (MMR) vaccine. Adults born  before 1957 generally are considered immune to measles and mumps. Adults born in 1957 or later should have 1 or more doses of MMR vaccine unless there is a contraindication to the vaccine or there is laboratory evidence of immunity to each of the three diseases. A routine second dose of MMR vaccine should be obtained at least 28 days after the first dose for students attending postsecondary schools, health care workers, or international travelers. People who received inactivated measles vaccine or an unknown type of measles vaccine during 1963-1967 should receive 2 doses of MMR vaccine. People who received inactivated mumps vaccine or an unknown type of mumps vaccine before 1979 and are at high risk for mumps infection should consider immunization with 2 doses of MMR vaccine. For females of childbearing age, rubella immunity should be determined. If there is no evidence of immunity, females who are not pregnant should be vaccinated. If there is no evidence of immunity, females who are pregnant should delay immunization until after pregnancy. Unvaccinated health care workers born before 1957 who lack laboratory evidence of measles, mumps, or rubella immunity or laboratory confirmation of disease should consider measles and mumps immunization with 2 doses of MMR vaccine or rubella immunization with 1 dose of MMR vaccine.  Pneumococcal 13-valent conjugate (PCV13) vaccine. When indicated, a person who is uncertain of his immunization history and has no record of immunization should receive the PCV13 vaccine. All adults 65 years of age and older should receive this  vaccine. An adult aged 19 years or older who has certain medical conditions and has not been previously immunized should receive 1 dose of PCV13 vaccine. This PCV13 should be followed with a dose of pneumococcal polysaccharide (PPSV23) vaccine. Adults who are at high risk for pneumococcal disease should obtain the PPSV23 vaccine at least 8 weeks after the dose of PCV13 vaccine. Adults older than 61 years of age who have normal immune system function should obtain the PPSV23 vaccine dose at least 1 year after the dose of PCV13 vaccine.  Pneumococcal polysaccharide (PPSV23) vaccine. When PCV13 is also indicated, PCV13 should be obtained first. All adults aged 65 years and older should be immunized. An adult younger than age 65 years who has certain medical conditions should be immunized. Any person who resides in a nursing home or long-term care facility should be immunized. An adult smoker should be immunized. People with an immunocompromised condition and certain other conditions should receive both PCV13 and PPSV23 vaccines. People with human immunodeficiency virus (HIV) infection should be immunized as soon as possible after diagnosis. Immunization during chemotherapy or radiation therapy should be avoided. Routine use of PPSV23 vaccine is not recommended for American Indians, [place] Natives, or people younger than 65 years unless there are medical conditions that require PPSV23 vaccine. When indicated, people who have unknown immunization and have no record of immunization should receive PPSV23 vaccine. One-time revaccination 5 years after the first dose of PPSV23 is recommended for people aged 19-64 years who have chronic kidney failure, nephrotic syndrome, asplenia, or immunocompromised conditions. People who received 1-2 doses of PPSV23 before age 65 years should receive another dose of PPSV23 vaccine at age 65 years or later if at least 5 years have passed since the previous dose. Doses of PPSV23 are not  needed for people immunized with PPSV23 at or after age 65 years.  Meningococcal vaccine. Adults with asplenia or persistent complement component deficiencies should receive 2 doses of quadrivalent meningococcal conjugate (MenACWY-D) vaccine. The doses should be obtained   at least 2 months apart. Microbiologists working with certain meningococcal bacteria, [place] recruits, people at risk during an outbreak, and people who travel to or live in countries with a high rate of meningitis should be immunized. A first-year college student up through age 34 years who is living in a residence hall should receive a dose if she did not receive a dose on or after her 16th birthday. Adults who have certain high-risk conditions should receive one or more doses of vaccine.  Hepatitis A vaccine. Adults who wish to be protected from this disease, have certain high-risk conditions, work with hepatitis A-infected animals, work in hepatitis A research labs, or travel to or work in countries with a high rate of hepatitis A should be immunized. Adults who were previously unvaccinated and who anticipate close contact with an international adoptee during the first 60 days after arrival in the [place] from a country with a high rate of hepatitis A should be immunized.  Hepatitis B vaccine. Adults who wish to be protected from this disease, have certain high-risk conditions, may be exposed to blood or other infectious body fluids, are household contacts or sex partners of hepatitis B positive people, are clients or workers in certain care facilities, or travel to or work in countries with a high rate of hepatitis B should be immunized.  Haemophilus influenzae type b (Hib) vaccine. A previously unvaccinated person with asplenia or sickle cell disease or having a scheduled splenectomy should receive 1 dose of Hib vaccine. Regardless of previous immunization, a recipient of a hematopoietic stem cell transplant should receive a  3-dose series 6-12 months after her successful transplant. Hib vaccine is not recommended for adults with HIV infection. Preventive Services / Frequency Ages 35 to 4 years  Blood pressure check.** / Every 3-5 years.  Lipid and cholesterol check.** / Every 5 years beginning at age 60.  Clinical breast exam.** / Every 3 years for women in their 71s and 10s.  BRCA-related cancer risk assessment.** / For women who have family members with a BRCA-related cancer (breast, ovarian, tubal, or peritoneal cancers).  Pap test.** / Every 2 years from ages 76 through 26. Every 3 years starting at age 61 through age 76 or 93 with a history of 3 consecutive normal Pap tests.  HPV screening.** / Every 3 years from ages 37 through ages 60 to 51 with a history of 3 consecutive normal Pap tests.  Hepatitis C blood test.** / For any individual with known risks for hepatitis C.  Skin self-exam. / Monthly.  Influenza vaccine. / Every year.  Tetanus, diphtheria, and acellular pertussis (Tdap, Td) vaccine.** / Consult your health care provider. Pregnant women should receive 1 dose of Tdap vaccine during each pregnancy. 1 dose of Td every 10 years.  Varicella vaccine.** / Consult your health care provider. Pregnant females who do not have evidence of immunity should receive the first dose after pregnancy.  HPV vaccine. / 3 doses over 6 months, if 93 and younger. The vaccine is not recommended for use in pregnant females. However, pregnancy testing is not needed before receiving a dose.  Measles, mumps, rubella (MMR) vaccine.** / You need at least 1 dose of MMR if you were born in 1957 or later. You may also need a 2nd dose. For females of childbearing age, rubella immunity should be determined. If there is no evidence of immunity, females who are not pregnant should be vaccinated. If there is no evidence of immunity, females who are  pregnant should delay immunization until after pregnancy.  Pneumococcal  13-valent conjugate (PCV13) vaccine.** / Consult your health care provider.  Pneumococcal polysaccharide (PPSV23) vaccine.** / 1 to 2 doses if you smoke cigarettes or if you have certain conditions.  Meningococcal vaccine.** / 1 dose if you are age 68 to 8 years and a Market researcher living in a residence hall, or have one of several medical conditions, you need to get vaccinated against meningococcal disease. You may also need additional booster doses.  Hepatitis A vaccine.** / Consult your health care provider.  Hepatitis B vaccine.** / Consult your health care provider.  Haemophilus influenzae type b (Hib) vaccine.** / Consult your health care provider. Ages 7 to 53 years  Blood pressure check.** / Every year.  Lipid and cholesterol check.** / Every 5 years beginning at age 25 years.  Lung cancer screening. / Every year if you are aged 11-80 years and have a 30-pack-year history of smoking and currently smoke or have quit within the past 15 years. Yearly screening is stopped once you have quit smoking for at least 15 years or develop a health problem that would prevent you from having lung cancer treatment.  Clinical breast exam.** / Every year after age 48 years.  BRCA-related cancer risk assessment.** / For women who have family members with a BRCA-related cancer (breast, ovarian, tubal, or peritoneal cancers).  Mammogram.** / Every year beginning at age 41 years and continuing for as long as you are in good health. Consult with your health care provider.  Pap test.** / Every 3 years starting at age 65 years through age 37 or 70 years with a history of 3 consecutive normal Pap tests.  HPV screening.** / Every 3 years from ages 72 years through ages 60 to 40 years with a history of 3 consecutive normal Pap tests.  Fecal occult blood test (FOBT) of stool. / Every year beginning at age 21 years and continuing until age 5 years. You may not need to do this test if you get  a colonoscopy every 10 years.  Flexible sigmoidoscopy or colonoscopy.** / Every 5 years for a flexible sigmoidoscopy or every 10 years for a colonoscopy beginning at age 35 years and continuing until age 48 years.  Hepatitis C blood test.** / For all people born from [birth year] through 1965 and any individual with known risks for hepatitis C.  Skin self-exam. / Monthly.  Influenza vaccine. / Every year.  Tetanus, diphtheria, and acellular pertussis (Tdap/Td) vaccine.** / Consult your health care provider. Pregnant women should receive 1 dose of Tdap vaccine during each pregnancy. 1 dose of Td every 10 years.  Varicella vaccine.** / Consult your health care provider. Pregnant females who do not have evidence of immunity should receive the first dose after pregnancy.  Zoster vaccine.** / 1 dose for adults aged 30 years or older.  Measles, mumps, rubella (MMR) vaccine.** / You need at least 1 dose of MMR if you were born in 1957 or later. You may also need a second dose. For females of childbearing age, rubella immunity should be determined. If there is no evidence of immunity, females who are not pregnant should be vaccinated. If there is no evidence of immunity, females who are pregnant should delay immunization until after pregnancy.  Pneumococcal 13-valent conjugate (PCV13) vaccine.** / Consult your health care provider.  Pneumococcal polysaccharide (PPSV23) vaccine.** / 1 to 2 doses if you smoke cigarettes or if you have certain conditions.  Meningococcal vaccine.** /  Consult your health care provider.  Hepatitis A vaccine.** / Consult your health care provider.  Hepatitis B vaccine.** / Consult your health care provider.  Haemophilus influenzae type b (Hib) vaccine.** / Consult your health care provider. Ages 64 years and over  Blood pressure check.** / Every year.  Lipid and cholesterol check.** / Every 5 years beginning at age 23 years.  Lung cancer screening. / Every year if you  are aged 16-80 years and have a 30-pack-year history of smoking and currently smoke or have quit within the past 15 years. Yearly screening is stopped once you have quit smoking for at least 15 years or develop a health problem that would prevent you from having lung cancer treatment.  Clinical breast exam.** / Every year after age 74 years.  BRCA-related cancer risk assessment.** / For women who have family members with a BRCA-related cancer (breast, ovarian, tubal, or peritoneal cancers).  Mammogram.** / Every year beginning at age 44 years and continuing for as long as you are in good health. Consult with your health care provider.  Pap test.** / Every 3 years starting at age 58 years through age 22 or 39 years with 3 consecutive normal Pap tests. Testing can be stopped between 65 and 70 years with 3 consecutive normal Pap tests and no abnormal Pap or HPV tests in the past 10 years.  HPV screening.** / Every 3 years from ages 64 years through ages 70 or 61 years with a history of 3 consecutive normal Pap tests. Testing can be stopped between 65 and 70 years with 3 consecutive normal Pap tests and no abnormal Pap or HPV tests in the past 10 years.  Fecal occult blood test (FOBT) of stool. / Every year beginning at age 40 years and continuing until age 27 years. You may not need to do this test if you get a colonoscopy every 10 years.  Flexible sigmoidoscopy or colonoscopy.** / Every 5 years for a flexible sigmoidoscopy or every 10 years for a colonoscopy beginning at age 7 years and continuing until age 32 years.  Hepatitis C blood test.** / For all people born from [birth year] through 1967 and any individual with known risks for hepatitis C.  Osteoporosis screening.** / A one-time screening for women ages 30 years and over and women at risk for fractures or osteoporosis.  Skin self-exam. / Monthly.  Influenza vaccine. / Every year.  Tetanus, diphtheria, and acellular pertussis (Tdap/Td)  vaccine.** / 1 dose of Td every 10 years.  Varicella vaccine.** / Consult your health care provider.  Zoster vaccine.** / 1 dose for adults aged 35 years or older.  Pneumococcal 13-valent conjugate (PCV13) vaccine.** / Consult your health care provider.  Pneumococcal polysaccharide (PPSV23) vaccine.** / 1 dose for all adults aged 46 years and older.  Meningococcal vaccine.** / Consult your health care provider.  Hepatitis A vaccine.** / Consult your health care provider.  Hepatitis B vaccine.** / Consult your health care provider.  Haemophilus influenzae type b (Hib) vaccine.** / Consult your health care provider. ** Family history and personal history of risk and conditions may change your health care provider's recommendations.   This information is not intended to replace advice given to you by your health care provider. Make sure you discuss any questions you have with your health care provider.   Document Released: 02/28/2001 Document Revised: 01/23/2014 Document Reviewed: 05/30/2010 Elsevier Interactive Patient Education [employer].

## 2014-11-02 NOTE — Progress Notes (Signed)
Pre visit review using our clinic review tool, if applicable. No additional management support is needed unless otherwise documented below in the visit note. 

## 2014-11-03 LAB — URINE CULTURE
COLONY COUNT: NO GROWTH
ORGANISM ID, BACTERIA: NO GROWTH

## 2014-11-03 NOTE — Addendum Note (Signed)
Addended by: Caffie Pinto on: 11/03/2014 08:33 AM   Modules accepted: Orders

## 2014-11-05 ENCOUNTER — Telehealth: Payer: Self-pay | Admitting: Emergency Medicine

## 2014-11-05 ENCOUNTER — Other Ambulatory Visit: Payer: Self-pay | Admitting: Emergency Medicine

## 2014-11-05 NOTE — Telephone Encounter (Signed)
I SPOKE WITH PATIENT TO LET HER KNOW THAT HIV ANTIBODY & HEP C ANTIBODY WAS NOT DRAWN ON OCT.17 AND DR.LOWNE SAID IT IS OK TO DRAW AT PATIENTS NEXT APPT.  I WILL FUTURE ORDER IT FOR THAT APPOINTMENT...KMP

## 2014-11-09 ENCOUNTER — Telehealth: Payer: Self-pay

## 2014-11-09 ENCOUNTER — Other Ambulatory Visit: Payer: Self-pay | Admitting: Family Medicine

## 2014-11-09 DIAGNOSIS — J302 Other seasonal allergic rhinitis: Secondary | ICD-10-CM

## 2014-11-09 MED ORDER — CLARITHROMYCIN 250 MG PO TABS: 250.0000 mg | ORAL_TABLET | Freq: Two times a day (BID) | ORAL | Status: DC

## 2014-11-09 MED ORDER — CLARITHROMYCIN ER 500 MG PO TB24: 1000.0000 mg | ORAL_TABLET | Freq: Every day | ORAL | Status: DC

## 2014-11-09 NOTE — Addendum Note (Signed)
Addended by: Tasia Catchings on: 11/09/2014 09:58 AM   Modules accepted: Orders

## 2014-11-09 NOTE — Addendum Note (Signed)
Addended by: Tasia Catchings on: 11/09/2014 05:16 PM   Modules accepted: Orders, Medications

## 2014-11-09 NOTE — Telephone Encounter (Signed)
biaxin xl 2 po qd x 14 days--- use flonase and otc antihistamine (zyrtec, claritin)----ov if no better

## 2014-11-09 NOTE — Telephone Encounter (Signed)
Rx sent to pharmacy. Will print letter for pt.

## 2014-11-09 NOTE — Telephone Encounter (Signed)
Pt states she is still have some cough, runny nose, and feels congested. Pt wants to know if she will need to come in the office or if you want to send something to the pharmacy. Please advise // HM

## 2014-11-17 ENCOUNTER — Ambulatory Visit: Payer: 59 | Admitting: Family Medicine

## 2014-12-30 ENCOUNTER — Other Ambulatory Visit: Payer: Self-pay | Admitting: Family Medicine

## 2015-03-04 ENCOUNTER — Telehealth: Payer: Self-pay | Admitting: Family Medicine

## 2015-03-04 NOTE — Telephone Encounter (Signed)
Patient has been made aware she will need an office visit.  She said she will have to call within the next 2 weeks.   KP

## 2015-03-04 NOTE — Telephone Encounter (Signed)
Pt called stating that she went to chiropractor today and has sciatic nerve pain. She said they told her they want to see her for 20 visits. Pt was advised to contact pcp for muscle relaxer. Advised pt she would need appt. Pt states that insurance will only cover 1 MD appt of any kind per pay period. Advised I would send msg for her.

## 2015-03-11 ENCOUNTER — Other Ambulatory Visit: Payer: Self-pay | Admitting: Nephrology

## 2015-03-11 DIAGNOSIS — N181 Chronic kidney disease, stage 1: Secondary | ICD-10-CM

## 2015-03-11 DIAGNOSIS — R809 Proteinuria, unspecified: Secondary | ICD-10-CM

## 2015-03-17 ENCOUNTER — Ambulatory Visit
Admission: RE | Admit: 2015-03-17 | Discharge: 2015-03-17 | Disposition: A | Discharge status: Home / Self Care | Payer: Commercial Managed Care - HMO | Source: Ambulatory Visit | Attending: Nephrology | Admitting: Nephrology

## 2015-03-17 DIAGNOSIS — R809 Proteinuria, unspecified: Secondary | ICD-10-CM

## 2015-03-17 DIAGNOSIS — N181 Chronic kidney disease, stage 1: Secondary | ICD-10-CM

## 2015-04-22 ENCOUNTER — Encounter: Payer: Self-pay | Admitting: Family Medicine

## 2015-04-22 ENCOUNTER — Ambulatory Visit (INDEPENDENT_AMBULATORY_CARE_PROVIDER_SITE_OTHER): Payer: Commercial Managed Care - HMO | Admitting: Family Medicine

## 2015-04-22 VITALS — BP 134/80 | HR 102 | Temp 98.2°F | Wt 186.6 lb

## 2015-04-22 DIAGNOSIS — R05 Cough: Secondary | ICD-10-CM

## 2015-04-22 DIAGNOSIS — J324 Chronic pansinusitis: Secondary | ICD-10-CM | POA: Diagnosis not present

## 2015-04-22 DIAGNOSIS — H6692 Otitis media, unspecified, left ear: Secondary | ICD-10-CM | POA: Diagnosis not present

## 2015-04-22 DIAGNOSIS — R059 Cough, unspecified: Secondary | ICD-10-CM

## 2015-04-22 MED ORDER — AMOXICILLIN-POT CLAVULANATE 875-125 MG PO TABS: 1.0000 | ORAL_TABLET | Freq: Two times a day (BID) | ORAL | Status: DC

## 2015-04-22 MED ORDER — PROMETHAZINE-DM 6.25-15 MG/5ML PO SYRP: 5.0000 mL | ORAL_SOLUTION | Freq: Four times a day (QID) | ORAL | Status: DC | PRN

## 2015-04-22 NOTE — Patient Instructions (Signed)

## 2015-04-22 NOTE — Progress Notes (Signed)
Patient ID: Megan [name], female    DOB: 1955-08-30  Age: 60 y.o. MRN: [id number]    Subjective:  Subjective HPI Megan [name] presents for c/o sinus congestion x 4 days.  Taking claritin and decongestant given to her at work.    Review of Systems  Constitutional: Positive for chills. Negative for fever.  HENT: Positive for congestion, postnasal drip, rhinorrhea, sinus pressure and sore throat.   Respiratory: Positive for cough, chest tightness, shortness of breath and wheezing.   Cardiovascular: Negative for chest pain, palpitations and leg swelling.  Allergic/Immunologic: Negative for environmental allergies.    History Past Medical History  Diagnosis Date  . Hypertension   . Diabetes mellitus   . Allergy   . Hyperlipidemia   . Infectious colitis   . Diverticulosis   . Colon polyp   . Hemorrhoids   . Ventral hernia   . GERD (gastroesophageal reflux disease)     occasionally  . H/O hiatal hernia     ?  Marland Kitchen Arthritis     fingers    She has past surgical history that includes Inguinal hernia repair; Hemorrhoid surgery; Ventral hernia repair; Foot surgery; colonscopy; Diagnostic laparoscopy; Incisional hernia repair (01/20/2011); and Hernia repair (01/20/2011).   Her family history includes Breast cancer in her mother; Cancer in her mother and sister; Diabetes in her brother and mother; Heart disease in her father; Throat cancer in her maternal aunt.She reports that she quit smoking about 19 years ago. Her smoking use included Cigarettes. She has a 20 pack-year smoking history. She has never used smokeless tobacco. She reports that she drinks about 1.8 oz of alcohol per week. She reports that she does not use illicit drugs.  Current Outpatient Prescriptions on File Prior to Visit  Medication Sig Dispense Refill  . albuterol (PROVENTIL HFA;VENTOLIN HFA) 108 (90 BASE) MCG/ACT inhaler Inhale 2 puffs into the lungs every 6 (six) hours as needed for wheezing or shortness of  breath. 1 Inhaler 0  . ALPRAZolam (XANAX) 0.5 MG tablet TAKE ONE TABLET BY MOUTH THREE TIMES DAILY AS NEEDED FOR ANXIETY. TAKE ONE BEFORE FLYING 30 tablet 0  . amLODipine (NORVASC) 5 MG tablet TAKE 1 TABLET BY MOUTH DAILY 90 tablet 1  . beclomethasone (QVAR) 40 MCG/ACT inhaler Inhale 2 puffs into the lungs 2 (two) times daily. 1 Inhaler 12  . Biotin 1000 MCG tablet Take 1,000 mcg by mouth 3 (three) times daily.    . Fish Oil-Cholecalciferol (FISH OIL + D3 PO) Take 1 capsule by mouth daily.    . fluticasone (FLONASE) 50 MCG/ACT nasal spray Place 2 sprays into both nostrils daily. 16 g 6  . FREESTYLE INSULINX TEST test strip Check blood sugar daily 100 each 12  . glucose blood (FREESTYLE LITE) test strip Use as instructed 100 each 12  . Lancets (FREESTYLE) lancets Use as instructed 100 each 12  . metFORMIN (GLUCOPHAGE) 1000 MG tablet TAKE 1 TABLET BY MOUTH TWICE DAILY WITH A MEAL 180 tablet 1  . Multiple Vitamin (MULTIVITAMIN) tablet Take 1 tablet by mouth daily.     . SitaGLIPtin-MetFORMIN HCl [phone] MG TB24 Take 1 tablet by mouth daily. 30 tablet 2  . vitamin B-12 (CYANOCOBALAMIN) 500 MCG tablet Take 1,000 mcg by mouth daily.      No current facility-administered medications on file prior to visit.     Objective:  Objective Physical Exam  Constitutional: She is oriented to person, place, and time. She appears well-developed and well-nourished.  HENT:  Right Ear: External ear normal.  Left Ear: External ear normal.  Nose: Right sinus exhibits maxillary sinus tenderness and frontal sinus tenderness. Left sinus exhibits maxillary sinus tenderness and frontal sinus tenderness.  Mouth/Throat: No posterior oropharyngeal erythema.  + PND + errythema  Eyes: Conjunctivae are normal. Right eye exhibits no discharge. Left eye exhibits no discharge.  Cardiovascular: Normal rate, regular rhythm and normal heart sounds.   No murmur heard. Pulmonary/Chest: Effort normal and breath sounds normal.  No respiratory distress. She has no wheezes. She has no rales. She exhibits no tenderness.  Musculoskeletal: She exhibits no edema.  Lymphadenopathy:    She has cervical adenopathy.  Neurological: She is alert and oriented to person, place, and time.  Psychiatric: She has a normal mood and affect. Her behavior is normal. Judgment and thought content normal.  Nursing note and vitals reviewed.  BP 134/80 mmHg  Pulse 102  Temp(Src) 98.2 F (36.8 C) (Oral)  Wt 186 lb 9.6 oz (84.641 kg)  SpO2 98% Wt Readings from Last 3 Encounters:  04/22/15 186 lb 9.6 oz (84.641 kg)  11/02/14 177 lb 12.8 oz (80.65 kg)  04/21/14 179 lb 3.2 oz (81.285 kg)     Lab Results  Component Value Date   WBC 10.8* 11/02/2014   HGB 12.4 11/02/2014   HCT 38.8 11/02/2014   PLT 410.0* 11/02/2014   GLUCOSE 122* 11/02/2014   CHOL 174 11/02/2014   TRIG 144.0 11/02/2014   HDL 60.10 11/02/2014   LDLDIRECT 88.0 04/21/2014   LDLCALC 85 11/02/2014   ALT 21 11/02/2014   AST 20 11/02/2014   NA 142 11/02/2014   K 3.8 11/02/2014   CL 103 11/02/2014   CREATININE 0.63 11/02/2014   BUN 9 11/02/2014   CO2 31 11/02/2014   TSH 0.89 11/02/2014   HGBA1C 6.8* 11/02/2014   MICROALBUR 64.8* 11/02/2014    US Renal  03/17/2015  CLINICAL DATA:  Chronic kidney disease and proteinuria. EXAM: RENAL / URINARY TRACT ULTRASOUND COMPLETE COMPARISON:  CT scan 10/24/2008. FINDINGS: Right Kidney: Length: 11.6 cm. Echogenicity within normal limits. No mass or hydronephrosis visualized. Left Kidney: Length: 10.8 cm. Echogenicity within normal limits. No mass or hydronephrosis visualized. Bladder: Appears normal for degree of bladder distention. IMPRESSION: Normal urinary tract ultrasound. Electronically Signed   By: Misty Stanley M.D.   On: 03/17/2015 14:12     Assessment & Plan:  Plan I have discontinued [name]'s amoxicillin-clavulanate, fluconazole, and clarithromycin. I am also having her start on amoxicillin-clavulanate and  promethazine-dextromethorphan. Additionally, I am having her maintain her vitamin B-12, glucose blood, freestyle, multivitamin, Biotin, FREESTYLE INSULINX TEST, Fish Oil-Cholecalciferol (FISH OIL + D3 PO), albuterol, beclomethasone, SitaGLIPtin-MetFORMIN HCl, ALPRAZolam, fluticasone, amLODipine, and metFORMIN.  Meds ordered this encounter  Medications  . amoxicillin-clavulanate (AUGMENTIN) 875-125 MG tablet    Sig: Take 1 tablet by mouth 2 (two) times daily.    Dispense:  20 tablet    Refill:  0  . promethazine-dextromethorphan (PROMETHAZINE-DM) 6.25-15 MG/5ML syrup    Sig: Take 5 mLs by mouth 4 (four) times daily as needed.    Dispense:  118 mL    Refill:  0    Problem List Items Addressed This Visit    None    Visit Diagnoses    Pansinusitis, unspecified chronicity    -  Primary    Relevant Medications    amoxicillin-clavulanate (AUGMENTIN) 875-125 MG tablet    promethazine-dextromethorphan (PROMETHAZINE-DM) 6.25-15 MG/5ML syrup    Acute left otitis media, recurrence not specified,  unspecified otitis media type        Relevant Medications    amoxicillin-clavulanate (AUGMENTIN) 875-125 MG tablet    promethazine-dextromethorphan (PROMETHAZINE-DM) 6.25-15 MG/5ML syrup    Cough        Relevant Medications    promethazine-dextromethorphan (PROMETHAZINE-DM) 6.25-15 MG/5ML syrup       Follow-up: Return if symptoms worsen or fail to improve.  [name], DO

## 2015-04-22 NOTE — Progress Notes (Signed)
Pre visit review using our clinic review tool, if applicable. No additional management support is needed unless otherwise documented below in the visit note. 

## 2015-04-25 ENCOUNTER — Other Ambulatory Visit: Payer: Self-pay | Admitting: Family Medicine

## 2015-04-27 ENCOUNTER — Encounter: Payer: Self-pay | Admitting: Family Medicine

## 2015-04-27 ENCOUNTER — Telehealth: Payer: Self-pay | Admitting: Family Medicine

## 2015-04-27 NOTE — Telephone Encounter (Signed)
Attempted to contact patient to reschedule appointment scheduled for 05/14/2015 with Dr. Carollee Herter. Left message on voicemail for patient to call back to reschedule. Appointment cancelled and letter mailed 04/27/2015

## 2015-04-28 ENCOUNTER — Telehealth: Payer: Self-pay | Admitting: Family Medicine
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
## 2015-04-28 NOTE — Telephone Encounter (Signed)
Left message for patient to call and reschedule 6 month follow up that was scheduled on 4/28 but provider will be out of the office. Patient thinks it is for a CPE but patient not due for CPE until 10/2015.

## 2015-04-28 NOTE — Telephone Encounter (Signed)
Use 1130 slot or put 2 15 min together.  Just please dont but several 30 min back to back

## 2015-04-28 NOTE — Telephone Encounter (Signed)
Please advise      KP 

## 2015-05-04 ENCOUNTER — Ambulatory Visit: Payer: Commercial Managed Care - HMO | Admitting: Family Medicine

## 2015-05-14 ENCOUNTER — Ambulatory Visit: Payer: Commercial Managed Care - HMO | Admitting: Family Medicine

## 2015-05-16 ENCOUNTER — Other Ambulatory Visit: Payer: Self-pay | Admitting: Family Medicine

## 2015-07-07 ENCOUNTER — Other Ambulatory Visit: Payer: Self-pay | Admitting: Family Medicine

## 2015-07-09 NOTE — Telephone Encounter (Signed)
Last seen 04/22/15 and filled 09/22/14 #30   Please advise    KP

## 2015-07-12 MED ORDER — ALPRAZOLAM 0.5 MG PO TABS: ORAL_TABLET | ORAL | Status: DC

## 2015-07-12 NOTE — Addendum Note (Signed)
Addended by: Ewing Schlein on: 07/12/2015 08:14 AM   Modules accepted: Orders

## 2015-07-12 NOTE — Telephone Encounter (Signed)
Rx faxed.    KP 

## 2015-11-08 ENCOUNTER — Ambulatory Visit (INDEPENDENT_AMBULATORY_CARE_PROVIDER_SITE_OTHER): Payer: Commercial Managed Care - HMO | Admitting: Family Medicine

## 2015-11-08 ENCOUNTER — Encounter: Payer: Self-pay | Admitting: Family Medicine

## 2015-11-08 VITALS — BP 122/82 | HR 83 | Ht 65.0 in | Wt 189.0 lb

## 2015-11-08 DIAGNOSIS — E1310 Other specified diabetes mellitus with ketoacidosis without coma: Secondary | ICD-10-CM

## 2015-11-08 DIAGNOSIS — R253 Fasciculation: Secondary | ICD-10-CM | POA: Diagnosis not present

## 2015-11-08 DIAGNOSIS — Z1159 Encounter for screening for other viral diseases: Secondary | ICD-10-CM

## 2015-11-08 DIAGNOSIS — R8290 Unspecified abnormal findings in urine: Secondary | ICD-10-CM

## 2015-11-08 DIAGNOSIS — Z8719 Personal history of other diseases of the digestive system: Secondary | ICD-10-CM

## 2015-11-08 DIAGNOSIS — Z0000 Encounter for general adult medical examination without abnormal findings: Secondary | ICD-10-CM

## 2015-11-08 DIAGNOSIS — I10 Essential (primary) hypertension: Secondary | ICD-10-CM

## 2015-11-08 DIAGNOSIS — E1110 Type 2 diabetes mellitus with ketoacidosis without coma: Secondary | ICD-10-CM

## 2015-11-08 LAB — POCT URINALYSIS DIPSTICK
BILIRUBIN UA: NEGATIVE
GLUCOSE UA: NEGATIVE
Ketones, UA: NEGATIVE
Leukocytes, UA: NEGATIVE
Nitrite, UA: NEGATIVE
SPEC GRAV UA: 1.025
Urobilinogen, UA: NEGATIVE
pH, UA: 6

## 2015-11-08 LAB — HEPATITIS C ANTIBODY: HCV Ab: NEGATIVE

## 2015-11-08 MED ORDER — AMLODIPINE BESYLATE 5 MG PO TABS: 5.0000 mg | ORAL_TABLET | Freq: Every day | ORAL | 1 refills | Status: DC

## 2015-11-08 MED ORDER — METFORMIN HCL 1000 MG PO TABS: ORAL_TABLET | ORAL | 1 refills | Status: DC

## 2015-11-08 NOTE — Patient Instructions (Signed)
Preventive Care for Adults, Female A healthy lifestyle and preventive care can promote health and wellness. Preventive health guidelines for women include the following key practices.  A routine yearly physical is a good way to check with your health care provider about your health and preventive screening. It is a chance to share any concerns and updates on your health and to receive a thorough exam.  Visit your dentist for a routine exam and preventive care every 6 months. Brush your teeth twice a day and floss once a day. Good oral hygiene prevents tooth decay and gum disease.  The frequency of eye exams is based on your age, health, family medical history, use of contact lenses, and other factors. Follow your health care provider's recommendations for frequency of eye exams.  Eat a healthy diet. Foods like vegetables, fruits, whole grains, low-fat dairy products, and lean protein foods contain the nutrients you need without too many calories. Decrease your intake of foods high in solid fats, added sugars, and salt. Eat the right amount of calories for you.Get information about a proper diet from your health care provider, if necessary.  Regular physical exercise is one of the most important things you can do for your health. Most adults should get at least 150 minutes of moderate-intensity exercise (any activity that increases your heart rate and causes you to sweat) each week. In addition, most adults need muscle-strengthening exercises on 2 or more days a week.  Maintain a healthy weight. The body mass index (BMI) is a screening tool to identify possible weight problems. It provides an estimate of body fat based on height and weight. Your health care provider can find your BMI and can help you achieve or maintain a healthy weight.For adults 20 years and older:  A BMI below 18.5 is considered underweight.  A BMI of 18.5 to 24.9 is normal.  A BMI of 25 to 29.9 is considered overweight.  A  BMI of 30 and above is considered obese.  Maintain normal blood lipids and cholesterol levels by exercising and minimizing your intake of saturated fat. Eat a balanced diet with plenty of fruit and vegetables. Blood tests for lipids and cholesterol should begin at age 45 and be repeated every 5 years. If your lipid or cholesterol levels are high, you are over 50, or you are at high risk for heart disease, you may need your cholesterol levels checked more frequently.Ongoing high lipid and cholesterol levels should be treated with medicines if diet and exercise are not working.  If you smoke, find out from your health care provider how to quit. If you do not use tobacco, do not start.  Lung cancer screening is recommended for adults aged 45-80 years who are at high risk for developing lung cancer because of a history of smoking. A yearly low-dose CT scan of the lungs is recommended for people who have at least a 30-pack-year history of smoking and are a current smoker or have quit within the past 15 years. A pack year of smoking is smoking an average of 1 pack of cigarettes a day for 1 year (for example: 1 pack a day for 30 years or 2 packs a day for 15 years). Yearly screening should continue until the smoker has stopped smoking for at least 15 years. Yearly screening should be stopped for people who develop a health problem that would prevent them from having lung cancer treatment.  If you are pregnant, do not drink alcohol. If you are  breastfeeding, be very cautious about drinking alcohol. If you are not pregnant and choose to drink alcohol, do not have more than 1 drink per day. One drink is considered to be 12 ounces (355 mL) of beer, 5 ounces (148 mL) of wine, or 1.5 ounces (44 mL) of liquor.  Avoid use of street drugs. Do not share needles with anyone. Ask for help if you need support or instructions about stopping the use of drugs.  High blood pressure causes heart disease and increases the risk  of stroke. Your blood pressure should be checked at least every 1 to 2 years. Ongoing high blood pressure should be treated with medicines if weight loss and exercise do not work.  If you are 55-79 years old, ask your health care provider if you should take aspirin to prevent strokes.  Diabetes screening is done by taking a blood sample to check your blood glucose level after you have not eaten for a certain period of time (fasting). If you are not overweight and you do not have risk factors for diabetes, you should be screened once every 3 years starting at age 45. If you are overweight or obese and you are 40-70 years of age, you should be screened for diabetes every year as part of your cardiovascular risk assessment.  Breast cancer screening is essential preventive care for women. You should practice "breast self-awareness." This means understanding the normal appearance and feel of your breasts and may include breast self-examination. Any changes detected, no matter how small, should be reported to a health care provider. Women in their 20s and 30s should have a clinical breast exam (CBE) by a health care provider as part of a regular health exam every 1 to 3 years. After age 40, women should have a CBE every year. Starting at age 40, women should consider having a mammogram (breast X-ray test) every year. Women who have a family history of breast cancer should talk to their health care provider about genetic screening. Women at a high risk of breast cancer should talk to their health care providers about having an MRI and a mammogram every year.  Breast cancer gene (BRCA)-related cancer risk assessment is recommended for women who have family members with BRCA-related cancers. BRCA-related cancers include breast, ovarian, tubal, and peritoneal cancers. Having family members with these cancers may be associated with an increased risk for harmful changes (mutations) in the breast cancer genes BRCA1 and  BRCA2. Results of the assessment will determine the need for genetic counseling and BRCA1 and BRCA2 testing.  Your health care provider may recommend that you be screened regularly for cancer of the pelvic organs (ovaries, uterus, and vagina). This screening involves a pelvic examination, including checking for microscopic changes to the surface of your cervix (Pap test). You may be encouraged to have this screening done every 3 years, beginning at age 21.  For women ages 30-65, health care providers may recommend pelvic exams and Pap testing every 3 years, or they may recommend the Pap and pelvic exam, combined with testing for human papilloma virus (HPV), every 5 years. Some types of HPV increase your risk of cervical cancer. Testing for HPV may also be done on women of any age with unclear Pap test results.  Other health care providers may not recommend any screening for nonpregnant women who are considered low risk for pelvic cancer and who do not have symptoms. Ask your health care provider if a screening pelvic exam is right for   you.  If you have had past treatment for cervical cancer or a condition that could lead to cancer, you need Pap tests and screening for cancer for at least 20 years after your treatment. If Pap tests have been discontinued, your risk factors (such as having a new sexual partner) need to be reassessed to determine if screening should resume. Some women have medical problems that increase the chance of getting cervical cancer. In these cases, your health care provider may recommend more frequent screening and Pap tests.  Colorectal cancer can be detected and often prevented. Most routine colorectal cancer screening begins at the age of 50 years and continues through age 75 years. However, your health care provider may recommend screening at an earlier age if you have risk factors for colon cancer. On a yearly basis, your health care provider may provide home test kits to check  for hidden blood in the stool. Use of a small camera at the end of a tube, to directly examine the colon (sigmoidoscopy or colonoscopy), can detect the earliest forms of colorectal cancer. Talk to your health care provider about this at age 50, when routine screening begins. Direct exam of the colon should be repeated every 5-10 years through age 75 years, unless early forms of precancerous polyps or small growths are found.  People who are at an increased risk for hepatitis B should be screened for this virus. You are considered at high risk for hepatitis B if:  You were born in a country where hepatitis B occurs often. Talk with your health care provider about which countries are considered high risk.  Your parents were born in a high-risk country and you have not received a shot to protect against hepatitis B (hepatitis B vaccine).  You have HIV or AIDS.  You use needles to inject street drugs.  You live with, or have sex with, someone who has hepatitis B.  You get hemodialysis treatment.  You take certain medicines for conditions like cancer, organ transplantation, and autoimmune conditions.  Hepatitis C blood testing is recommended for all people born from 1945 through 1965 and any individual with known risks for hepatitis C.  Practice safe sex. Use condoms and avoid high-risk sexual practices to reduce the spread of sexually transmitted infections (STIs). STIs include gonorrhea, chlamydia, syphilis, trichomonas, herpes, HPV, and human immunodeficiency virus (HIV). Herpes, HIV, and HPV are viral illnesses that have no cure. They can result in disability, cancer, and death.  You should be screened for sexually transmitted illnesses (STIs) including gonorrhea and chlamydia if:  You are sexually active and are younger than 24 years.  You are older than 24 years and your health care provider tells you that you are at risk for this type of infection.  Your sexual activity has changed  since you were last screened and you are at an increased risk for chlamydia or gonorrhea. Ask your health care provider if you are at risk.  If you are at risk of being infected with HIV, it is recommended that you take a prescription medicine daily to prevent HIV infection. This is called preexposure prophylaxis (PrEP). You are considered at risk if:  You are sexually active and do not regularly use condoms or know the HIV status of your partner(s).  You take drugs by injection.  You are sexually active with a partner who has HIV.  Talk with your health care provider about whether you are at high risk of being infected with HIV. If   you choose to begin PrEP, you should first be tested for HIV. You should then be tested every 3 months for as long as you are taking PrEP.  Osteoporosis is a disease in which the bones lose minerals and strength with aging. This can result in serious bone fractures or breaks. The risk of osteoporosis can be identified using a bone density scan. Women ages 67 years and over and women at risk for fractures or osteoporosis should discuss screening with their health care providers. Ask your health care provider whether you should take a calcium supplement or vitamin D to reduce the rate of osteoporosis.  Menopause can be associated with physical symptoms and risks. Hormone replacement therapy is available to decrease symptoms and risks. You should talk to your health care provider about whether hormone replacement therapy is right for you.  Use sunscreen. Apply sunscreen liberally and repeatedly throughout the day. You should seek shade when your shadow is shorter than you. Protect yourself by wearing long sleeves, pants, a wide-brimmed hat, and sunglasses year round, whenever you are outdoors.  Once a month, do a whole body skin exam, using a mirror to look at the skin on your back. Tell your health care provider of new moles, moles that have irregular borders, moles that  are larger than a pencil eraser, or moles that have changed in shape or color.  Stay current with required vaccines (immunizations).  Influenza vaccine. All adults should be immunized every year.  Tetanus, diphtheria, and acellular pertussis (Td, Tdap) vaccine. Pregnant women should receive 1 dose of Tdap vaccine during each pregnancy. The dose should be obtained regardless of the length of time since the last dose. Immunization is preferred during the 27th-36th week of gestation. An adult who has not previously received Tdap or who does not know her vaccine status should receive 1 dose of Tdap. This initial dose should be followed by tetanus and diphtheria toxoids (Td) booster doses every 10 years. Adults with an unknown or incomplete history of completing a 3-dose immunization series with Td-containing vaccines should begin or complete a primary immunization series including a Tdap dose. Adults should receive a Td booster every 10 years.  Varicella vaccine. An adult without evidence of immunity to varicella should receive 2 doses or a second dose if she has previously received 1 dose. Pregnant females who do not have evidence of immunity should receive the first dose after pregnancy. This first dose should be obtained before leaving the health care facility. The second dose should be obtained 4-8 weeks after the first dose.  Human papillomavirus (HPV) vaccine. Females aged 13-26 years who have not received the vaccine previously should obtain the 3-dose series. The vaccine is not recommended for use in pregnant females. However, pregnancy testing is not needed before receiving a dose. If a female is found to be pregnant after receiving a dose, no treatment is needed. In that case, the remaining doses should be delayed until after the pregnancy. Immunization is recommended for any person with an immunocompromised condition through the age of 61 years if she did not get any or all doses earlier. During the  3-dose series, the second dose should be obtained 4-8 weeks after the first dose. The third dose should be obtained 24 weeks after the first dose and 16 weeks after the second dose.  Zoster vaccine. One dose is recommended for adults aged 30 years or older unless certain conditions are present.  Measles, mumps, and rubella (MMR) vaccine. Adults born  before 1957 generally are considered immune to measles and mumps. Adults born in 1957 or later should have 1 or more doses of MMR vaccine unless there is a contraindication to the vaccine or there is laboratory evidence of immunity to each of the three diseases. A routine second dose of MMR vaccine should be obtained at least 28 days after the first dose for students attending postsecondary schools, health care workers, or international travelers. People who received inactivated measles vaccine or an unknown type of measles vaccine during 1963-1967 should receive 2 doses of MMR vaccine. People who received inactivated mumps vaccine or an unknown type of mumps vaccine before 1979 and are at high risk for mumps infection should consider immunization with 2 doses of MMR vaccine. For females of childbearing age, rubella immunity should be determined. If there is no evidence of immunity, females who are not pregnant should be vaccinated. If there is no evidence of immunity, females who are pregnant should delay immunization until after pregnancy. Unvaccinated health care workers born before 1957 who lack laboratory evidence of measles, mumps, or rubella immunity or laboratory confirmation of disease should consider measles and mumps immunization with 2 doses of MMR vaccine or rubella immunization with 1 dose of MMR vaccine.  Pneumococcal 13-valent conjugate (PCV13) vaccine. When indicated, a person who is uncertain of his immunization history and has no record of immunization should receive the PCV13 vaccine. All adults 65 years of age and older should receive this  vaccine. An adult aged 19 years or older who has certain medical conditions and has not been previously immunized should receive 1 dose of PCV13 vaccine. This PCV13 should be followed with a dose of pneumococcal polysaccharide (PPSV23) vaccine. Adults who are at high risk for pneumococcal disease should obtain the PPSV23 vaccine at least 8 weeks after the dose of PCV13 vaccine. Adults older than 62 years of age who have normal immune system function should obtain the PPSV23 vaccine dose at least 1 year after the dose of PCV13 vaccine.  Pneumococcal polysaccharide (PPSV23) vaccine. When PCV13 is also indicated, PCV13 should be obtained first. All adults aged 65 years and older should be immunized. An adult younger than age 65 years who has certain medical conditions should be immunized. Any person who resides in a nursing home or long-term care facility should be immunized. An adult smoker should be immunized. People with an immunocompromised condition and certain other conditions should receive both PCV13 and PPSV23 vaccines. People with human immunodeficiency virus (HIV) infection should be immunized as soon as possible after diagnosis. Immunization during chemotherapy or radiation therapy should be avoided. Routine use of PPSV23 vaccine is not recommended for American Indians, [place] Natives, or people younger than 65 years unless there are medical conditions that require PPSV23 vaccine. When indicated, people who have unknown immunization and have no record of immunization should receive PPSV23 vaccine. One-time revaccination 5 years after the first dose of PPSV23 is recommended for people aged 19-64 years who have chronic kidney failure, nephrotic syndrome, asplenia, or immunocompromised conditions. People who received 1-2 doses of PPSV23 before age 65 years should receive another dose of PPSV23 vaccine at age 65 years or later if at least 5 years have passed since the previous dose. Doses of PPSV23 are not  needed for people immunized with PPSV23 at or after age 65 years.  Meningococcal vaccine. Adults with asplenia or persistent complement component deficiencies should receive 2 doses of quadrivalent meningococcal conjugate (MenACWY-D) vaccine. The doses should be obtained   at least 2 months apart. Microbiologists working with certain meningococcal bacteria, [place] recruits, people at risk during an outbreak, and people who travel to or live in countries with a high rate of meningitis should be immunized. A first-year college student up through age 34 years who is living in a residence hall should receive a dose if she did not receive a dose on or after her 16th birthday. Adults who have certain high-risk conditions should receive one or more doses of vaccine.  Hepatitis A vaccine. Adults who wish to be protected from this disease, have certain high-risk conditions, work with hepatitis A-infected animals, work in hepatitis A research labs, or travel to or work in countries with a high rate of hepatitis A should be immunized. Adults who were previously unvaccinated and who anticipate close contact with an international adoptee during the first 60 days after arrival in the [place] from a country with a high rate of hepatitis A should be immunized.  Hepatitis B vaccine. Adults who wish to be protected from this disease, have certain high-risk conditions, may be exposed to blood or other infectious body fluids, are household contacts or sex partners of hepatitis B positive people, are clients or workers in certain care facilities, or travel to or work in countries with a high rate of hepatitis B should be immunized.  Haemophilus influenzae type b (Hib) vaccine. A previously unvaccinated person with asplenia or sickle cell disease or having a scheduled splenectomy should receive 1 dose of Hib vaccine. Regardless of previous immunization, a recipient of a hematopoietic stem cell transplant should receive a  3-dose series 6-12 months after her successful transplant. Hib vaccine is not recommended for adults with HIV infection. Preventive Services / Frequency Ages 35 to 4 years  Blood pressure check.** / Every 3-5 years.  Lipid and cholesterol check.** / Every 5 years beginning at age 60.  Clinical breast exam.** / Every 3 years for women in their 71s and 10s.  BRCA-related cancer risk assessment.** / For women who have family members with a BRCA-related cancer (breast, ovarian, tubal, or peritoneal cancers).  Pap test.** / Every 2 years from ages 76 through 26. Every 3 years starting at age 61 through age 76 or 93 with a history of 3 consecutive normal Pap tests.  HPV screening.** / Every 3 years from ages 37 through ages 60 to 51 with a history of 3 consecutive normal Pap tests.  Hepatitis C blood test.** / For any individual with known risks for hepatitis C.  Skin self-exam. / Monthly.  Influenza vaccine. / Every year.  Tetanus, diphtheria, and acellular pertussis (Tdap, Td) vaccine.** / Consult your health care provider. Pregnant women should receive 1 dose of Tdap vaccine during each pregnancy. 1 dose of Td every 10 years.  Varicella vaccine.** / Consult your health care provider. Pregnant females who do not have evidence of immunity should receive the first dose after pregnancy.  HPV vaccine. / 3 doses over 6 months, if 93 and younger. The vaccine is not recommended for use in pregnant females. However, pregnancy testing is not needed before receiving a dose.  Measles, mumps, rubella (MMR) vaccine.** / You need at least 1 dose of MMR if you were born in 1957 or later. You may also need a 2nd dose. For females of childbearing age, rubella immunity should be determined. If there is no evidence of immunity, females who are not pregnant should be vaccinated. If there is no evidence of immunity, females who are  pregnant should delay immunization until after pregnancy.  Pneumococcal  13-valent conjugate (PCV13) vaccine.** / Consult your health care provider.  Pneumococcal polysaccharide (PPSV23) vaccine.** / 1 to 2 doses if you smoke cigarettes or if you have certain conditions.  Meningococcal vaccine.** / 1 dose if you are age 68 to 8 years and a Market researcher living in a residence hall, or have one of several medical conditions, you need to get vaccinated against meningococcal disease. You may also need additional booster doses.  Hepatitis A vaccine.** / Consult your health care provider.  Hepatitis B vaccine.** / Consult your health care provider.  Haemophilus influenzae type b (Hib) vaccine.** / Consult your health care provider. Ages 7 to 53 years  Blood pressure check.** / Every year.  Lipid and cholesterol check.** / Every 5 years beginning at age 25 years.  Lung cancer screening. / Every year if you are aged 11-80 years and have a 30-pack-year history of smoking and currently smoke or have quit within the past 15 years. Yearly screening is stopped once you have quit smoking for at least 15 years or develop a health problem that would prevent you from having lung cancer treatment.  Clinical breast exam.** / Every year after age 48 years.  BRCA-related cancer risk assessment.** / For women who have family members with a BRCA-related cancer (breast, ovarian, tubal, or peritoneal cancers).  Mammogram.** / Every year beginning at age 41 years and continuing for as long as you are in good health. Consult with your health care provider.  Pap test.** / Every 3 years starting at age 65 years through age 37 or 70 years with a history of 3 consecutive normal Pap tests.  HPV screening.** / Every 3 years from ages 72 years through ages 60 to 40 years with a history of 3 consecutive normal Pap tests.  Fecal occult blood test (FOBT) of stool. / Every year beginning at age 21 years and continuing until age 5 years. You may not need to do this test if you get  a colonoscopy every 10 years.  Flexible sigmoidoscopy or colonoscopy.** / Every 5 years for a flexible sigmoidoscopy or every 10 years for a colonoscopy beginning at age 35 years and continuing until age 48 years.  Hepatitis C blood test.** / For all people born from [birth year] through 1965 and any individual with known risks for hepatitis C.  Skin self-exam. / Monthly.  Influenza vaccine. / Every year.  Tetanus, diphtheria, and acellular pertussis (Tdap/Td) vaccine.** / Consult your health care provider. Pregnant women should receive 1 dose of Tdap vaccine during each pregnancy. 1 dose of Td every 10 years.  Varicella vaccine.** / Consult your health care provider. Pregnant females who do not have evidence of immunity should receive the first dose after pregnancy.  Zoster vaccine.** / 1 dose for adults aged 30 years or older.  Measles, mumps, rubella (MMR) vaccine.** / You need at least 1 dose of MMR if you were born in 1957 or later. You may also need a second dose. For females of childbearing age, rubella immunity should be determined. If there is no evidence of immunity, females who are not pregnant should be vaccinated. If there is no evidence of immunity, females who are pregnant should delay immunization until after pregnancy.  Pneumococcal 13-valent conjugate (PCV13) vaccine.** / Consult your health care provider.  Pneumococcal polysaccharide (PPSV23) vaccine.** / 1 to 2 doses if you smoke cigarettes or if you have certain conditions.  Meningococcal vaccine.** /  Consult your health care provider.  Hepatitis A vaccine.** / Consult your health care provider.  Hepatitis B vaccine.** / Consult your health care provider.  Haemophilus influenzae type b (Hib) vaccine.** / Consult your health care provider. Ages 64 years and over  Blood pressure check.** / Every year.  Lipid and cholesterol check.** / Every 5 years beginning at age 23 years.  Lung cancer screening. / Every year if you  are aged 16-80 years and have a 30-pack-year history of smoking and currently smoke or have quit within the past 15 years. Yearly screening is stopped once you have quit smoking for at least 15 years or develop a health problem that would prevent you from having lung cancer treatment.  Clinical breast exam.** / Every year after age 74 years.  BRCA-related cancer risk assessment.** / For women who have family members with a BRCA-related cancer (breast, ovarian, tubal, or peritoneal cancers).  Mammogram.** / Every year beginning at age 44 years and continuing for as long as you are in good health. Consult with your health care provider.  Pap test.** / Every 3 years starting at age 58 years through age 22 or 39 years with 3 consecutive normal Pap tests. Testing can be stopped between 65 and 70 years with 3 consecutive normal Pap tests and no abnormal Pap or HPV tests in the past 10 years.  HPV screening.** / Every 3 years from ages 64 years through ages 70 or 61 years with a history of 3 consecutive normal Pap tests. Testing can be stopped between 65 and 70 years with 3 consecutive normal Pap tests and no abnormal Pap or HPV tests in the past 10 years.  Fecal occult blood test (FOBT) of stool. / Every year beginning at age 40 years and continuing until age 27 years. You may not need to do this test if you get a colonoscopy every 10 years.  Flexible sigmoidoscopy or colonoscopy.** / Every 5 years for a flexible sigmoidoscopy or every 10 years for a colonoscopy beginning at age 7 years and continuing until age 32 years.  Hepatitis C blood test.** / For all people born from [birth year] through 1967 and any individual with known risks for hepatitis C.  Osteoporosis screening.** / A one-time screening for women ages 30 years and over and women at risk for fractures or osteoporosis.  Skin self-exam. / Monthly.  Influenza vaccine. / Every year.  Tetanus, diphtheria, and acellular pertussis (Tdap/Td)  vaccine.** / 1 dose of Td every 10 years.  Varicella vaccine.** / Consult your health care provider.  Zoster vaccine.** / 1 dose for adults aged 35 years or older.  Pneumococcal 13-valent conjugate (PCV13) vaccine.** / Consult your health care provider.  Pneumococcal polysaccharide (PPSV23) vaccine.** / 1 dose for all adults aged 46 years and older.  Meningococcal vaccine.** / Consult your health care provider.  Hepatitis A vaccine.** / Consult your health care provider.  Hepatitis B vaccine.** / Consult your health care provider.  Haemophilus influenzae type b (Hib) vaccine.** / Consult your health care provider. ** Family history and personal history of risk and conditions may change your health care provider's recommendations.   This information is not intended to replace advice given to you by your health care provider. Make sure you discuss any questions you have with your health care provider.   Document Released: 02/28/2001 Document Revised: 01/23/2014 Document Reviewed: 05/30/2010 Elsevier Interactive Patient Education [employer].

## 2015-11-08 NOTE — Progress Notes (Signed)
Pre visit review using our clinic review tool, if applicable. No additional management support is needed unless otherwise documented below in the visit note. 

## 2015-11-08 NOTE — Progress Notes (Signed)
Subjective:     Megan [name] is a 60 y.o. female and is here for a comprehensive physical exam. The patient reports problems - twitching-- it started when she was young and then went away and came back and worsened  over last few months.  twitch is in shoulders only-- occurs all day long. HYPERTENSION   Blood pressure range-not checkin  Chest pain- no      Dyspnea- no Lightheadedness- no   Edema- no  Other side effects - no   Medication compliance: good Low salt diet- yes    DIABETES    Blood Sugar ranges- 117-149----- one reading over 200   Polyuria- no New Visual problems- no  Hypoglycemic symptoms- no  Other side effects-no Medication compliance - good Last eye exam- due Foot exam- today   HYPERLIPIDEMIA  Medication compliance- good RUQ pain- no  Muscle aches- no Other side effects-no   Review of Systems  Constitutional: Negative for activity change, appetite change and fatigue.  HENT: Negative for hearing loss, congestion, tinnitus and ear discharge.  dentist q71m Eyes: Negative for visual disturbance (see optho q1y -- vision corrected to 20/20 with glasses).  Respiratory: Negative for cough, chest tightness and shortness of breath.   Cardiovascular: Negative for chest pain, palpitations and leg swelling.  Gastrointestinal: Negative for abdominal pain, diarrhea, constipation and abdominal distention.  Genitourinary: Negative for urgency, frequency, decreased urine volume and difficulty urinating.  Musculoskeletal: Negative for back pain, arthralgias and gait problem.  Skin: Negative for color change, pallor and rash.  Neurological: Negative for dizziness, light-headedness, numbness and headaches.  Hematological: Negative for adenopathy. Does not bruise/bleed easily.  Psychiatric/Behavioral: Negative for suicidal ideas, confusion, sleep disturbance, self-injury, dysphoric mood, decreased concentration and agitation.         Social History   Social History  .  Marital status: Divorced    Spouse name: N/A  . Number of children: N/A  . Years of education: N/A   Occupational History  . Not on file.   Social History Main Topics  . Smoking status: Former Smoker    Packs/day: 1.00    Years: 20.00    Types: Cigarettes    Quit date: 01/05/1996  . Smokeless tobacco: Never Used  . Alcohol use 1.8 oz/week    2 Glasses of wine, 1 Cans of beer per week  . Drug use: No  . Sexual activity: Not on file   Other Topics Concern  . Not on file   Social History Narrative  . No narrative on file   Health Maintenance  Topic Date Due  . Hepatitis C Screening  02/15/1955  . HIV Screening  03/19/1968  . FOOT EXAM  04/21/2015  . HEMOGLOBIN A1C  05/03/2015  . OPHTHALMOLOGY EXAM  10/03/2015  . URINE MICROALBUMIN  11/02/2015  . INFLUENZA VACCINE  11/07/2016 (Originally 08/17/2015)  . MAMMOGRAM  12/16/2016  . PAP SMEAR  01/07/2017  . PNEUMOCOCCAL POLYSACCHARIDE VACCINE (2) 07/02/2018  . COLONOSCOPY  11/18/2019  . TETANUS/TDAP  05/04/2022  . ZOSTAVAX  Completed    The following portions of the patient's history were reviewed and updated as appropriate:  She  has a past medical history of Allergy; Arthritis; Colon polyp; Diabetes mellitus; Diverticulosis; GERD (gastroesophageal reflux disease); H/O hiatal hernia; Hemorrhoids; Hyperlipidemia; Hypertension; Infectious colitis; and Ventral hernia. She  does not have any pertinent problems on file. She  has a past surgical history that includes Inguinal hernia repair; Hemorrhoid surgery; Ventral hernia repair; Foot surgery; colonscopy; Diagnostic laparoscopy;  Incisional hernia repair (01/20/2011); and Hernia repair (01/20/2011). Her family history includes Breast cancer in her mother; Cancer in her mother and sister; Dementia in her mother; Diabetes in her brother and mother; Heart disease in her father; Throat cancer in her maternal aunt. She  reports that she quit smoking about 19 years ago. Her smoking use  included Cigarettes. She has a 20.00 pack-year smoking history. She has never used smokeless tobacco. She reports that she drinks about 1.8 oz of alcohol per week . She reports that she does not use drugs. She has a current medication list which includes the following prescription(s): albuterol, alprazolam, amlodipine, beclomethasone, fish oil-cholecalciferol, fluticasone, freestyle insulinx test, glucose blood, freestyle, metformin, multivitamin, and vitamin b-12. Current Outpatient Prescriptions on File Prior to Visit  Medication Sig Dispense Refill  . albuterol (PROVENTIL HFA;VENTOLIN HFA) 108 (90 BASE) MCG/ACT inhaler Inhale 2 puffs into the lungs every 6 (six) hours as needed for wheezing or shortness of breath. 1 Inhaler 0  . ALPRAZolam (XANAX) 0.5 MG tablet TAKE ONE TABLET BY MOUTH THREE TIMES DAILY AS NEEDED FOR ANXIETY. TAKE ONE BEFORE FLYING. 30 tablet 0  . beclomethasone (QVAR) 40 MCG/ACT inhaler Inhale 2 puffs into the lungs 2 (two) times daily. 1 Inhaler 12  . Fish Oil-Cholecalciferol (FISH OIL + D3 PO) Take 1 capsule by mouth daily.    . fluticasone (FLONASE) 50 MCG/ACT nasal spray Place 2 sprays into both nostrils daily. 16 g 6  . FREESTYLE INSULINX TEST test strip Check blood sugar daily 100 each 12  . glucose blood (FREESTYLE LITE) test strip Use as instructed 100 each 12  . Lancets (FREESTYLE) lancets Use as instructed 100 each 12  . Multiple Vitamin (MULTIVITAMIN) tablet Take 1 tablet by mouth daily.     . vitamin B-12 (CYANOCOBALAMIN) 500 MCG tablet Take 1,000 mcg by mouth daily.      No current facility-administered medications on file prior to visit.    She is allergic to aspirin..  Review of Systems Review of Systems  Constitutional: Negative for activity change, appetite change and fatigue.  HENT: Negative for hearing loss, congestion, tinnitus and ear discharge.  dentist q77m Eyes: Negative for visual disturbance (see optho q1y -- vision corrected to 20/20 with  glasses).  Respiratory: Negative for cough, chest tightness and shortness of breath.   Cardiovascular: Negative for chest pain, palpitations and leg swelling.  Gastrointestinal: Negative for abdominal pain, diarrhea, constipation and abdominal distention.  Genitourinary: Negative for urgency, frequency, decreased urine volume and difficulty urinating.  Musculoskeletal: Negative for back pain, arthralgias and gait problem.  Skin: Negative for color change, pallor and rash.  Neurological: Negative for dizziness, light-headedness, numbness and headaches.  Hematological: Negative for adenopathy. Does not bruise/bleed easily.  Psychiatric/Behavioral: Negative for suicidal ideas, confusion, sleep disturbance, self-injury, dysphoric mood, decreased concentration and agitation.       Objective:    BP 122/82 (BP Location: Left Arm, Patient Position: Sitting, Cuff Size: Normal)   Pulse 83   Ht 5\' 5"  (1.651 m)   Wt 189 lb (85.7 kg)   SpO2 98%   BMI 31.45 kg/m  General appearance: alert, cooperative, appears stated age and no distress Head: Normocephalic, without obvious abnormality, atraumatic Eyes: conjunctivae/corneas clear. PERRL, EOM's intact. Fundi benign. Ears: normal TM's and external ear canals both ears Nose: Nares normal. Septum midline. Mucosa normal. No drainage or sinus tenderness. Throat: lips, mucosa, and tongue normal; teeth and gums normal Neck: no adenopathy, no carotid bruit, no JVD, supple, symmetrical,  trachea midline and thyroid not enlarged, symmetric, no tenderness/mass/nodules Back: symmetric, no curvature. ROM normal. No CVA tenderness. Lungs: clear to auscultation bilaterally Breasts: gyn Heart: regular rate and rhythm, S1, S2 normal, no murmur, click, rub or gallop Abdomen: soft, non-tender; bowel sounds normal; no masses,  no organomegaly Pelvic: deferred--gyn Extremities: extremities normal, atraumatic, no cyanosis or edema Pulses: 2+ and symmetric Skin: Skin  color, texture, turgor normal. No rashes or lesions Lymph nodes: Cervical, supraclavicular, and axillary nodes normal. Neurologic: Alert and oriented X 3, normal strength and tone. Normal symmetric reflexes. Normal coordination and gait    Assessment:    Healthy female exam.      Plan:    ghm utd Check labs See After Visit Summary for Counseling Recommendations    1. Twitching  - Ambulatory referral to Neurology - TSH  2. History of ventral hernia  - Ambulatory referral to General Surgery  3. Preventative health care See above ghm utd Flu shot at work - POCT urinalysis dipstick - TSH - Lipid panel - CBC with Differential/Platelet - Comprehensive metabolic panel  4. Uncontrolled type 2 diabetes mellitus with ketoacidosis without coma, without long-term current use of insulin (HCC) Check labs  - POCT urinalysis dipstick - Hemoglobin A1c - metFORMIN (GLUCOPHAGE) 1000 MG tablet; TAKE 1 TABLET BY MOUTH TWICE DAILY WITH A MEAL  Dispense: 180 tablet; Refill: 1  5. Essential hypertension stable - amLODipine (NORVASC) 5 MG tablet; Take 1 tablet (5 mg total) by mouth daily.  Dispense: 90 tablet; Refill: 1

## 2015-11-09 LAB — COMPREHENSIVE METABOLIC PANEL
ALBUMIN: 4.2 g/dL (ref 3.5–5.2)
ALK PHOS: 72 U/L (ref 39–117)
ALT: 35 U/L (ref 0–35)
AST: 26 U/L (ref 0–37)
BILIRUBIN TOTAL: 0.7 mg/dL (ref 0.2–1.2)
BUN: 13 mg/dL (ref 6–23)
CALCIUM: 9.7 mg/dL (ref 8.4–10.5)
CO2: 30 mEq/L (ref 19–32)
Chloride: 103 mEq/L (ref 96–112)
Creatinine, Ser: 0.69 mg/dL (ref 0.40–1.20)
GFR: 110.64 mL/min (ref >60.00)
GLUCOSE: 99 mg/dL (ref 70–99)
POTASSIUM: 3.8 meq/L (ref 3.5–5.1)
Sodium: 140 mEq/L (ref 135–145)
TOTAL PROTEIN: 7.8 g/dL (ref 6.0–8.3)

## 2015-11-09 LAB — CBC WITH DIFFERENTIAL/PLATELET
BASOS ABS: 0 10*3/uL (ref 0.0–0.1)
Basophils Relative: 0.3 % (ref 0.0–3.0)
EOS PCT: 1.4 % (ref 0.0–5.0)
Eosinophils Absolute: 0.2 10*3/uL (ref 0.0–0.7)
HEMATOCRIT: 38.8 % (ref 36.0–46.0)
HEMOGLOBIN: 12.3 g/dL (ref 12.0–15.0)
LYMPHS PCT: 28.9 % (ref 12.0–46.0)
Lymphs Abs: 3.2 10*3/uL (ref 0.7–4.0)
MCHC: 31.8 g/dL (ref 30.0–36.0)
MONOS PCT: 7.1 % (ref 3.0–12.0)
Monocytes Absolute: 0.8 10*3/uL (ref 0.1–1.0)
Neutro Abs: 6.9 10*3/uL (ref 1.4–7.7)
Neutrophils Relative %: 62.3 % (ref 43.0–77.0)
Platelets: 370 10*3/uL (ref 150.0–400.0)
RBC: 5.71 Mil/uL — AB (ref 3.87–5.11)
RDW: 16.2 % — ABNORMAL HIGH (ref 11.5–15.5)
WBC: 11.1 10*3/uL — ABNORMAL HIGH (ref 4.0–10.5)

## 2015-11-09 LAB — LIPID PANEL
CHOL/HDL RATIO: 3
Cholesterol: 158 mg/dL (ref 0–200)
HDL: 50.3 mg/dL (ref >39.00)
LDL Cholesterol: 67 mg/dL (ref 0–99)
NONHDL: 107.4
TRIGLYCERIDES: 200 mg/dL — AB (ref 0.0–149.0)
VLDL: 40 mg/dL (ref 0.0–40.0)

## 2015-11-09 LAB — HEMOGLOBIN A1C: Hgb A1c MFr Bld: 7.2 % — ABNORMAL HIGH (ref 4.6–6.5)

## 2015-11-09 LAB — TSH: TSH: 0.92 u[IU]/mL (ref 0.35–4.50)

## 2015-11-10 LAB — URINE CULTURE

## 2015-11-15 ENCOUNTER — Telehealth: Payer: Self-pay | Admitting: Family Medicine

## 2015-11-15 MED ORDER — SITAGLIPTIN PHOS-METFORMIN HCL 50-1000 MG PO TABS: 1.0000 | ORAL_TABLET | Freq: Two times a day (BID) | ORAL | 2 refills | Status: DC

## 2015-11-15 NOTE — Telephone Encounter (Signed)
Called patient 3 days with unsuccessful attempt, I left several messages but unable to reach patient. Sent out letter with patient's results and medication instructions, and follow up appointment with PCP.

## 2015-11-15 NOTE — Telephone Encounter (Signed)
Caller name: Relationship to patient: Self Can be reached: [phone] Pharmacy:  Reason for call: Patient request call back to get lab results. May leave detailed message on VM if no answer. Patient states she called on Friday and got no return

## 2015-11-17 NOTE — Telephone Encounter (Signed)
Patient notified of results and verbalized understanding. Medication already filled to pharmacy as requested.

## 2015-11-21 ENCOUNTER — Other Ambulatory Visit: Payer: Self-pay | Admitting: Family Medicine

## 2015-11-24 ENCOUNTER — Other Ambulatory Visit: Payer: Self-pay | Admitting: Family Medicine

## 2015-11-24 NOTE — Telephone Encounter (Signed)
This medication refill request was denied today. Pt was changed to Janumet by PCP.

## 2015-12-08 ENCOUNTER — Other Ambulatory Visit: Payer: Self-pay | Admitting: Surgery

## 2015-12-08 DIAGNOSIS — R1900 Intra-abdominal and pelvic swelling, mass and lump, unspecified site: Secondary | ICD-10-CM

## 2015-12-17 NOTE — Progress Notes (Signed)
Megan [name] was seen today in neurologic consultation at the request of [name], DO.  The consultation is for the evaluation of "twitching."  Pt reports that it is in her shoulders.  She had it when she was younger ([age] years old) and it went away when she was in high school but then came back about a year ago.  It occurs intermittently.  It happens in both shoulders.  Stress will bring it out.  They are short handed at work and it seems to be worse then.  She wonders if it happens in the sleep as she is sore in the shoulders when she wakes up.  No one sleeps in the bed with her.  She thinks that maybe she can control it but it is so intermittent.  No fam hx of abnormal movements and no abnormal movements elsewhere.  No new medications.  Supposed to start a new DM med but hasn't yet.  Only shoulder moves and nothing else.  No gait changes.  No swallowing trouble.  No speech change.  Neuroimaging has not previously been performed.    ALLERGIES:   Allergies  Allergen Reactions  . Aspirin Nausea Only    CURRENT MEDICATIONS:  Outpatient Encounter Prescriptions as of 12/20/2015  Medication Sig  . albuterol (PROVENTIL HFA;VENTOLIN HFA) 108 (90 BASE) MCG/ACT inhaler Inhale 2 puffs into the lungs every 6 (six) hours as needed for wheezing or shortness of breath.  Marland Kitchen amLODipine (NORVASC) 5 MG tablet TAKE 1 TABLET BY MOUTH DAILY  . beclomethasone (QVAR) 40 MCG/ACT inhaler Inhale 2 puffs into the lungs 2 (two) times daily.  . Fish Oil-Cholecalciferol (FISH OIL + D3 PO) Take 1 capsule by mouth daily.  . fluticasone (FLONASE) 50 MCG/ACT nasal spray Place 2 sprays into both nostrils daily.  . metFORMIN (GLUCOPHAGE) 1000 MG tablet TAKE 1 TABLET BY MOUTH TWICE DAILY WITH A MEAL  . Multiple Vitamin (MULTIVITAMIN) tablet Take 1 tablet by mouth daily.   . sitaGLIPtin-metformin (JANUMET) 50-1000 MG tablet Take 1 tablet by mouth 2 (two) times daily with a meal. (Patient not taking: Reported on  12/20/2015)  . [DISCONTINUED] ALPRAZolam (XANAX) 0.5 MG tablet TAKE ONE TABLET BY MOUTH THREE TIMES DAILY AS NEEDED FOR ANXIETY. TAKE ONE BEFORE FLYING. (Patient not taking: Reported on 12/20/2015)  . [DISCONTINUED] FREESTYLE INSULINX TEST test strip Check blood sugar daily  . [DISCONTINUED] glucose blood (FREESTYLE LITE) test strip Use as instructed  . [DISCONTINUED] Lancets (FREESTYLE) lancets Use as instructed  . [DISCONTINUED] vitamin B-12 (CYANOCOBALAMIN) 500 MCG tablet Take 1,000 mcg by mouth daily.    No facility-administered encounter medications on file as of 12/20/2015.     PAST MEDICAL HISTORY:   Past Medical History:  Diagnosis Date  . Allergy   . Arthritis    fingers  . Colon polyp   . Diabetes mellitus   . Diverticulosis   . GERD (gastroesophageal reflux disease)    occasionally  . H/O hiatal hernia    ?  Marland Kitchen Hemorrhoids   . Hyperlipidemia    takes fish oil  . Hypertension   . Infectious colitis   . Ventral hernia     PAST SURGICAL HISTORY:   Past Surgical History:  Procedure Laterality Date  . colonscopy    . DIAGNOSTIC LAPAROSCOPY    . FOOT SURGERY Bilateral    hammer toes  . HEMORRHOID SURGERY    . HERNIA REPAIR  01/20/2011   incisional  . INCISIONAL HERNIA REPAIR  01/20/2011   Procedure: LAPAROSCOPIC INCISIONAL HERNIA;  Surgeon: Judieth Keens, DO;  Location: [facility];  Service: General;  Laterality: N/A;  Laparoscopic repair of a recurrent incisional hernia with Mesh  . INGUINAL HERNIA REPAIR     x's 3  . VENTRAL HERNIA REPAIR      SOCIAL HISTORY:   Social History   Social History  . Marital status: Divorced    Spouse name: N/A  . Number of children: N/A  . Years of education: N/A   Occupational History  . assessment specialist     city [place]   Social History Main Topics  . Smoking status: Former Smoker    Packs/day: 1.00    Years: 20.00    Types: Cigarettes    Quit date: 01/05/1996  . Smokeless tobacco: Never Used  . Alcohol  use Yes     Comment: wine (bottle on weekends)  . Drug use: No  . Sexual activity: Not on file   Other Topics Concern  . Not on file   Social History Narrative  . No narrative on file    FAMILY HISTORY:   Family Status  Relation Status  . Mother Deceased  . Father Deceased at age 66  . Maternal Aunt   . Brother   .    Marland Kitchen Sister     ROS:  No lateralizing weakness/paresthesia.  No swallowing trouble.  No trouble speaking.  A complete 10 system review of systems was obtained and was unremarkable apart from what is mentioned above.  PHYSICAL EXAMINATION:    VITALS:   Vitals:   12/20/15 0856  BP: 136/70  Pulse: 83  Weight: 188 lb (85.3 kg)  Height: 5\' 5"  (1.651 m)    GEN:  Normal appears female in no acute distress.  Appears stated age. HEENT:  Normocephalic, atraumatic. The mucous membranes are moist. The superficial temporal arteries are without ropiness or tenderness. Cardiovascular: Regular rate and rhythm. Lungs: Clear to auscultation bilaterally. Neck/Heme: There are no carotid bruits noted bilaterally.  NEUROLOGICAL: Orientation:  The patient is alert and oriented x 3.  Fund of knowledge is appropriate.  Recent and remote memory intact.  Attention span and concentration normal.  Repeats and names without difficulty. Cranial nerves: There is good facial symmetry. The pupils are equal round and reactive to light bilaterally. Fundoscopic exam is attempted but the disc margins are not well visualized bilaterally.   Extraocular muscles are intact and visual fields are full to confrontational testing. Speech is fluent and clear. Soft palate rises symmetrically and there is no tongue deviation. Hearing is intact to conversational tone. Tone: Tone is good throughout. Sensation: Sensation is intact to light touch and pinprick throughout (facial, trunk, extremities). Vibration is intact at the bilateral big toe. There is no extinction with double simultaneous stimulation. There  is no sensory dermatomal level identified. Coordination:  The patient has no difficulty with RAM's or FNF bilaterally. Motor: Strength is 5/5 in the bilateral upper and lower extremities.  Shoulder shrug is equal and symmetric. There is no pronator drift.  There are no fasciculations noted. DTR's: Deep tendon reflexes are 2/4 at the bilateral biceps, triceps, brachioradialis, patella and achilles.  Plantar responses are downgoing bilaterally. Gait and Station: The patient is able to ambulate without difficulty. The patient is able to heel toe walk without any difficulty. The patient is able to ambulate in a tandem fashion. The patient is able to stand in the Romberg position. Abnormal movements: I did not  note any abnormal movements during her examination today.  She stated that she did not have any.  My medical assistant did state that she had one when she put her back and it was described as a "shoulder shrug."  LABS:    Chemistry      Component Value Date/Time   NA 140 11/08/2015 1608   K 3.8 11/08/2015 1608   CL 103 11/08/2015 1608   CO2 30 11/08/2015 1608   BUN 13 11/08/2015 1608   CREATININE 0.69 11/08/2015 1608   CREATININE 0.73 04/26/2012 1539      Component Value Date/Time   CALCIUM 9.7 11/08/2015 1608   ALKPHOS 72 11/08/2015 1608   AST 26 11/08/2015 1608   ALT 35 11/08/2015 1608   BILITOT 0.7 11/08/2015 1608     Lab Results  Component Value Date   TSH 0.92 11/08/2015   Lab Results  Component Value Date   VITAMINB12 784 11/10/2010   Lab Results  Component Value Date   HGBA1C 7.2 (H) 11/08/2015   Lab Results  Component Value Date   WBC 11.1 (H) 11/08/2015   HGB 12.3 11/08/2015   HCT 38.8 11/08/2015   MCV 68.1 Repeated and verified X2. (L) 11/08/2015   PLT 370.0 11/08/2015   Lab Results  Component Value Date   ANA NEG 04/06/2009     IMPRESSION/PLAN  1. Abnormal involuntary movements  -I actually did not witness any on her examination.  I tried to  provoke it through distraction procedures, but nothing was witnessed.  Based on hx of having same as a child, I wonder if this is simple motor tics.  Only in shoulders and stress increases sx.  However, I did tell her I would do some lab work just to make sure I am not missing anything. The lab work will include:  Vitamin D, ceruloplasmin, copper, PTH, ferritin, sedimentation rate, ANA, antiphospholipid antibody, lupus anticoagulant, RPR, B12, antigliadin antibody.  I also told her that I would like to see her back in the next 8 months, just to follow her and make sure that no further syndromes or increased movements developed without me knowing.  I will be happy to see her back if symptoms develop in the meantime.  She expressed that she really did not want any medication.  Nothing focal/lateralizing on her examination today.  Much greater than 50% of this visit was spent in counseling and coordinating care.  Total face to face time:  60 min    Cc:  [name], DO

## 2015-12-20 ENCOUNTER — Other Ambulatory Visit: Payer: Self-pay | Admitting: Neurology

## 2015-12-20 ENCOUNTER — Encounter: Payer: Self-pay | Admitting: Neurology

## 2015-12-20 ENCOUNTER — Ambulatory Visit (INDEPENDENT_AMBULATORY_CARE_PROVIDER_SITE_OTHER): Payer: Commercial Managed Care - HMO | Admitting: Neurology

## 2015-12-20 ENCOUNTER — Other Ambulatory Visit: Payer: Commercial Managed Care - HMO

## 2015-12-20 VITALS — BP 136/70 | HR 83 | Ht 65.0 in | Wt 188.0 lb

## 2015-12-20 DIAGNOSIS — Z5181 Encounter for therapeutic drug level monitoring: Secondary | ICD-10-CM

## 2015-12-20 DIAGNOSIS — R5382 Chronic fatigue, unspecified: Secondary | ICD-10-CM | POA: Diagnosis not present

## 2015-12-20 DIAGNOSIS — E118 Type 2 diabetes mellitus with unspecified complications: Secondary | ICD-10-CM | POA: Diagnosis not present

## 2015-12-20 DIAGNOSIS — R259 Unspecified abnormal involuntary movements: Secondary | ICD-10-CM

## 2015-12-20 NOTE — Patient Instructions (Signed)
1. Your provider has requested that you have labwork completed today. Please go to [facility] ([id number]) on the second floor of this building before leaving the office today. You do not need to check in. If you are not called within 15 minutes please check with the front desk.   

## 2015-12-21 LAB — GLIADIN ANTIBODIES, SERUM
GLIADIN IGG: 3 U (ref <20)
Gliadin IgA: 40 Units — ABNORMAL HIGH (ref <20)

## 2015-12-21 LAB — VITAMIN D 25 HYDROXY (VIT D DEFICIENCY, FRACTURES): Vit D, 25-Hydroxy: 35 ng/mL (ref 30–100)

## 2015-12-21 LAB — ANA: Anti Nuclear Antibody(ANA): NEGATIVE

## 2015-12-21 LAB — RPR

## 2015-12-21 LAB — PTH, INTACT AND CALCIUM
CALCIUM: 9.5 mg/dL (ref 8.6–10.4)
PTH: 29 pg/mL (ref 14–64)

## 2015-12-21 LAB — VITAMIN B12: Vitamin B-12: 368 pg/mL (ref 200–1100)

## 2015-12-21 LAB — SEDIMENTATION RATE: Sed Rate: 6 mm/hr (ref 0–30)

## 2015-12-21 LAB — FERRITIN: FERRITIN: 178 ng/mL (ref 20–288)

## 2015-12-22 ENCOUNTER — Telehealth: Payer: Self-pay | Admitting: Neurology

## 2015-12-22 LAB — COPPER, URINE - RANDOM OR 24 HOUR
COPPER UR: 35 ug/L
CREATININE(CRT), U: 2.06 g/L (ref 0.30–3.00)
Copper / Creatinine Ratio: 17 ug/g creat (ref 0–49)

## 2015-12-22 LAB — CERULOPLASMIN: Ceruloplasmin: 33 mg/dL (ref 18–53)

## 2015-12-22 NOTE — Telephone Encounter (Signed)
Left message on machine for patient to call back.

## 2015-12-22 NOTE — Telephone Encounter (Signed)
-----   Message from [place], DO sent at 12/22/2015  9:09 AM EST ----- Lab looks okay

## 2015-12-23 LAB — RFX PTT-LA W/RFX TO HEX PHASE CONF: PTT-LA SCREEN: 32 s (ref <=40)

## 2015-12-23 LAB — CARDIOLIPIN ANTIBODY: PHOSPHOLIPIDS: 217 mg/dL (ref 151–264)

## 2015-12-23 LAB — LUPUS ANTICOAGULANT PANEL

## 2015-12-23 LAB — RFX DRVVT SCR W/RFLX CONF 1:1 MIX: DRVVT SCREEN: 31 s (ref <=45)

## 2015-12-23 NOTE — Telephone Encounter (Signed)
Patient made aware labs okay. 

## 2015-12-24 ENCOUNTER — Telehealth: Payer: Self-pay | Admitting: Neurology

## 2015-12-24 DIAGNOSIS — R899 Unspecified abnormal finding in specimens from other organs, systems and tissues: Secondary | ICD-10-CM

## 2015-12-24 DIAGNOSIS — K9041 Non-celiac gluten sensitivity: Secondary | ICD-10-CM

## 2015-12-24 NOTE — Telephone Encounter (Signed)
Left message on machine for patient to call back.

## 2015-12-24 NOTE — Telephone Encounter (Signed)
-----   Message from [place], DO sent at 12/24/2015 12:22 PM EST ----- Labs look okay except could have gluten sensitivity (which may be red herring in all of this).  Consult GI if patient willing

## 2015-12-27 NOTE — Telephone Encounter (Signed)
Patient made aware.  Referral sent to GI office.

## 2015-12-27 NOTE — Telephone Encounter (Signed)
Patient returned your call Friday after hours. Her # is [id number]. Thank you

## 2015-12-30 ENCOUNTER — Telehealth: Payer: Self-pay | Admitting: Neurology

## 2015-12-30 ENCOUNTER — Telehealth: Payer: Self-pay | Admitting: Family Medicine

## 2015-12-30 NOTE — Telephone Encounter (Signed)
Labs neuro did showed a gluten sensitivity Dr Tat thinking is that this is exacerbating the problem--- so she would like GI to see her but she can start on a gluten free diet in the meantime---- try paleo diet or meditaranean diet

## 2015-12-30 NOTE — Telephone Encounter (Signed)
Called and left a message for call back  

## 2015-12-30 NOTE — Telephone Encounter (Signed)
Left message on machine for patient to call back.

## 2015-12-30 NOTE — Telephone Encounter (Signed)
Caller name: Relationship to patient: Self Can be reached: [phone]  Pharmacy:  Reason for call: Patient request call back from provider because she is discouraged with the neurologist that she was referred to. States they want to send her to another specialist and she doesn't know why. States she would like for her provider to review the notes in her chart and call her back. States she respects her providers opinion, and she needs to know how she feels about the notes.

## 2015-12-30 NOTE — Telephone Encounter (Signed)
Patient needs to talk to someone about why we sent her to another doctor please call [phone]

## 2015-12-31 NOTE — Telephone Encounter (Signed)
Pt returned call.  Discussed provider's recommendation with patient.  Pt stated understanding and agrees with provider's recommendation for gluten free diet.  Pt states she's not familiar with gluten free diet and asked for patient educational materials.  Pt instruction sheets printed and mailed to patient's address.

## 2016-01-03 ENCOUNTER — Ambulatory Visit
Admission: RE | Admit: 2016-01-03 | Discharge: 2016-01-03 | Disposition: A | Discharge status: Home / Self Care | Payer: Commercial Managed Care - HMO | Source: Ambulatory Visit | Attending: Surgery | Admitting: Surgery

## 2016-01-03 DIAGNOSIS — R1900 Intra-abdominal and pelvic swelling, mass and lump, unspecified site: Secondary | ICD-10-CM

## 2016-01-03 MED ORDER — IOPAMIDOL (ISOVUE-300) INJECTION 61%
100.0000 mL | Freq: Once | INTRAVENOUS | Status: AC | PRN
  Administered 2016-01-03: 100 mL via INTRAVENOUS

## 2016-01-05 ENCOUNTER — Ambulatory Visit: Payer: Commercial Managed Care - HMO | Admitting: Family Medicine

## 2016-01-05 ENCOUNTER — Telehealth: Payer: Self-pay | Admitting: Family Medicine

## 2016-01-05 NOTE — Telephone Encounter (Signed)
No charge. 

## 2016-01-05 NOTE — Telephone Encounter (Signed)
Patient lvm 8:34am cancelling her 1:15pm appointment due to feeling better, charge or no charge

## 2016-01-18 ENCOUNTER — Encounter: Payer: Self-pay | Admitting: Gastroenterology

## 2016-02-12 ENCOUNTER — Other Ambulatory Visit: Payer: Self-pay | Admitting: Family Medicine

## 2016-02-23 DIAGNOSIS — N181 Chronic kidney disease, stage 1: Secondary | ICD-10-CM | POA: Diagnosis not present

## 2016-02-23 DIAGNOSIS — I10 Essential (primary) hypertension: Secondary | ICD-10-CM | POA: Diagnosis not present

## 2016-02-23 DIAGNOSIS — R809 Proteinuria, unspecified: Secondary | ICD-10-CM | POA: Diagnosis not present

## 2016-02-28 ENCOUNTER — Ambulatory Visit (INDEPENDENT_AMBULATORY_CARE_PROVIDER_SITE_OTHER): Payer: Commercial Managed Care - HMO | Admitting: Gastroenterology

## 2016-02-28 ENCOUNTER — Encounter: Payer: Self-pay | Admitting: Gastroenterology

## 2016-02-28 ENCOUNTER — Encounter (INDEPENDENT_AMBULATORY_CARE_PROVIDER_SITE_OTHER): Payer: Self-pay

## 2016-02-28 VITALS — BP 130/66 | HR 80 | Ht 64.5 in | Wt 185.1 lb

## 2016-02-28 DIAGNOSIS — R140 Abdominal distension (gaseous): Secondary | ICD-10-CM

## 2016-02-28 NOTE — Progress Notes (Signed)
[facility] Consult Note:  History: Megan [name] 02/28/2016  Referring physician: Yvonne R Lowne Chase, DO  Reason for consult/chief complaint: Bloated (epigastric/upper abd)   Subjective  HPI:  This is a 61-year-old woman referred by primary care. She felt that her abdomen was "bloated" by which she meant protuberant. She denies abdominal pain diarrhea nausea vomiting early satiety dysphagia or weight loss. She had a normal colonoscopy with Dr. Brodie in 2011, but it was noted to have a poor preparation. Based on the patient's symptoms, she was sent to surgery for consideration of possible hernia. CT scan did not show any hernia, and there were no apparent problems related to mesh placed for a previous hernia repair. Patient was sent to neurology for some muscle twitching of the shoulders, lab reports seem to have shown a positive antigliadin antibody. She was then referred to us for possible gluten intolerance.   ROS:  Review of Systems  Constitutional: Negative for appetite change and unexpected weight change.  HENT: Negative for mouth sores and voice change.   Eyes: Negative for pain and redness.  Respiratory: Negative for cough and shortness of breath.   Cardiovascular: Negative for chest pain and palpitations.  Genitourinary: Negative for dysuria and hematuria.  Musculoskeletal: Negative for arthralgias and myalgias.  Skin: Negative for pallor and rash.  Neurological: Negative for weakness and headaches.  Hematological: Negative for adenopathy.     Past Medical History: Past Medical History:  Diagnosis Date  . Allergy   . Arthritis    fingers  . Colon polyp   . Diabetes mellitus   . Diverticulosis   . GERD (gastroesophageal reflux disease)    occasionally  . H/O hiatal hernia    ?  . Hemorrhoids   . Hyperlipidemia    takes fish oil  . Hypertension   . Infectious colitis   . Ventral hernia      Past Surgical History: Past Surgical  History:  Procedure Laterality Date  . colonscopy    . DIAGNOSTIC LAPAROSCOPY    . FOOT SURGERY Bilateral    hammer toes  . HEMORRHOID SURGERY    . HERNIA REPAIR  01/20/2011   incisional  . INCISIONAL HERNIA REPAIR  01/20/2011   Procedure: LAPAROSCOPIC INCISIONAL HERNIA;  Surgeon: Brian David Layton, DO;  Location: [facility];  Service: General;  Laterality: N/A;  Laparoscopic repair of a recurrent incisional hernia with Mesh  . INGUINAL HERNIA REPAIR     x's 3  . VENTRAL HERNIA REPAIR       Family History: Family History  Problem Relation Age of Onset  . Breast cancer Mother   . Diabetes Mother   . Dementia Mother   . Liver cancer Mother   . Heart disease Father     heart attack  . Diabetes Brother   . Breast cancer Sister     Social History: Social History   Social History  . Marital status: Divorced    Spouse name: N/A  . Number of children: 1  . Years of education: N/A   Occupational History  . assessment specialist     city of [place]   Social History Main Topics  . Smoking status: Former Smoker    Packs/day: 1.00    Years: 20.00    Types: Cigarettes    Quit date: 01/05/1996  . Smokeless tobacco: Never Used  . Alcohol use Yes     Comment: wine (bottle on weekends)  . Drug use: No  . Sexual activity:   Not Asked   Other Topics Concern  . None   Social History Narrative  . None    Allergies: Allergies  Allergen Reactions  . Aspirin Nausea Only    Outpatient Meds: Current Outpatient Prescriptions  Medication Sig Dispense Refill  . albuterol (PROVENTIL HFA;VENTOLIN HFA) 108 (90 BASE) MCG/ACT inhaler Inhale 2 puffs into the lungs every 6 (six) hours as needed for wheezing or shortness of breath. 1 Inhaler 0  . amLODipine (NORVASC) 5 MG tablet TAKE 1 TABLET BY MOUTH DAILY 90 tablet 0  . beclomethasone (QVAR) 40 MCG/ACT inhaler Inhale 2 puffs into the lungs 2 (two) times daily. 1 Inhaler 12  . Fish Oil-Cholecalciferol (FISH OIL + D3 PO) Take 1  capsule by mouth daily.    . fluticasone (FLONASE) 50 MCG/ACT nasal spray Place 2 sprays into both nostrils daily. (Patient taking differently: Place 2 sprays into both nostrils as needed. ) 16 g 6  . losartan (COZAAR) 25 MG tablet Take 1 tablet by mouth daily.    . Multiple Vitamin (MULTIVITAMIN) tablet Take 1 tablet by mouth daily.     . Multiple Vitamins-Minerals (HAIR/SKIN/NAILS/BIOTIN PO) Take 1 tablet by mouth daily.    . sitaGLIPtin-metformin (JANUMET) 50-1000 MG tablet Take 1 tablet by mouth 2 (two) times daily with a meal. 60 tablet 2   No current facility-administered medications for this visit.       ___________________________________________________________________ Objective   Exam:  BP 130/66 (BP Location: Left Arm, Patient Position: Sitting, Cuff Size: Normal)   Pulse 80   Ht 5' 4.5" (1.638 m) Comment: height measured without shoes  Wt 185 lb 2 oz (84 kg)   BMI 31.29 kg/m    General: this is a(n) Well-appearing woman   Eyes: sclera anicteric, no redness  ENT: oral mucosa moist without lesions, no cervical or supraclavicular lymphadenopathy, good dentition  CV: RRR without murmur, S1/S2, no JVD, no peripheral edema  Resp: clear to auscultation bilaterally, normal RR and effort noted  GI: soft, protuberant, no tenderness, with active bowel sounds. No guarding or palpable organomegaly noted.  Skin; warm and dry, no rash or jaundice noted  Neuro: awake, alert and oriented x 3. Normal gross motor function and fluent speech  Labs:  Antibody tests as noted above  Radiologic Studies:  CT abdomen and pelvis ordered by surgery was unremarkable except for incidental diverticulosis and uterine fibroids  Assessment: Encounter Diagnosis  Name Primary?  . Bloating Yes  Patient has a protuberant abdomen because of her body habitus, she does not seem to have any pathologic cause for that. Do not think she has gluten intolerance. The gliadin antibody has poor  specificity, the patient does not have symptoms of celiac sprue.  Plan:  No further testing planned at this time.  I advised the patient to have a screening colonoscopy sometime this year, which would be about a 7 year interval from the last time. The reason for this rather than 10 years is the poor preparation noted on last colonoscopy report. He feels she would probably be ready to do that next fall after she has met her annual insurance deductible. I asked her to contact us at that time, and we'll also put a six-month reminder in the system.  Thank you for the courtesy of this consult.  Please call me with any questions or concerns.  Danaria Larsen L Danis III  CC: Yvonne R Lowne Chase, DO  

## 2016-02-28 NOTE — Patient Instructions (Signed)
You will be due for a recall colonoscopy in 08-2016. We will send you a reminder in the mail when it gets closer to that time.  If you are age 63 or older, your body mass index should be between 23-30. Your Body mass index is 31.29 kg/m. If this is out of the aforementioned range listed, please consider follow up with your Primary Care Provider.  If you are age 66 or younger, your body mass index should be between 19-25. Your Body mass index is 31.29 kg/m. If this is out of the aformentioned range listed, please consider follow up with your Primary Care Provider.   Thank you for choosing [facility]  Dr Wilfrid Lund III

## 2016-03-07 ENCOUNTER — Other Ambulatory Visit (HOSPITAL_COMMUNITY): Payer: Self-pay | Admitting: Nephrology

## 2016-03-07 DIAGNOSIS — N181 Chronic kidney disease, stage 1: Secondary | ICD-10-CM

## 2016-03-13 DIAGNOSIS — M2042 Other hammer toe(s) (acquired), left foot: Secondary | ICD-10-CM | POA: Diagnosis not present

## 2016-03-13 DIAGNOSIS — E1351 Other specified diabetes mellitus with diabetic peripheral angiopathy without gangrene: Secondary | ICD-10-CM | POA: Diagnosis not present

## 2016-03-13 DIAGNOSIS — M2041 Other hammer toe(s) (acquired), right foot: Secondary | ICD-10-CM | POA: Diagnosis not present

## 2016-03-14 DIAGNOSIS — N181 Chronic kidney disease, stage 1: Secondary | ICD-10-CM | POA: Diagnosis not present

## 2016-03-14 DIAGNOSIS — I10 Essential (primary) hypertension: Secondary | ICD-10-CM | POA: Diagnosis not present

## 2016-05-01 ENCOUNTER — Other Ambulatory Visit: Payer: Self-pay | Admitting: Family Medicine

## 2016-05-16 ENCOUNTER — Other Ambulatory Visit: Payer: Self-pay | Admitting: Family Medicine

## 2016-06-19 ENCOUNTER — Encounter: Payer: Self-pay | Admitting: Family

## 2016-06-19 ENCOUNTER — Ambulatory Visit (INDEPENDENT_AMBULATORY_CARE_PROVIDER_SITE_OTHER): Payer: Commercial Managed Care - HMO | Admitting: Family

## 2016-06-19 VITALS — BP 131/72 | HR 78 | Temp 98.3°F | Resp 16 | Ht 65.0 in | Wt 187.0 lb

## 2016-06-19 DIAGNOSIS — E041 Nontoxic single thyroid nodule: Secondary | ICD-10-CM

## 2016-06-19 DIAGNOSIS — J40 Bronchitis, not specified as acute or chronic: Secondary | ICD-10-CM | POA: Diagnosis not present

## 2016-06-19 MED ORDER — BENZONATATE 100 MG PO CAPS: 100.0000 mg | ORAL_CAPSULE | Freq: Three times a day (TID) | ORAL | 0 refills | Status: DC | PRN

## 2016-06-19 MED ORDER — DOXYCYCLINE HYCLATE 100 MG PO TABS
100.0000 mg | ORAL_TABLET | Freq: Two times a day (BID) | ORAL | 0 refills | Status: DC
Start: 2016-06-19 — End: 2016-08-21

## 2016-06-19 NOTE — Patient Instructions (Signed)
Start doxycycline for your bronchitis. You may use tessalon as needed for cough. Call if symptoms worsen or if symptoms are not improved in 3-4 days.

## 2016-06-19 NOTE — Progress Notes (Signed)
Subjective:    Patient ID: Megan [name], female    DOB: Mar 08, 1955, 61 y.o.   MRN: [id number]  HPI  Megan [name] is a 61 yr old female who presents today with chief complaint of cough. She reports that cough is productive and is associated with nasal congestion. Cough has been present x 6 days. Has been using robitussin, loratadine, vicks nasal spray.  Denies fever but notes + "sweats."    Review of Systems See HPI  Past Medical History:  Diagnosis Date  . Allergy   . Arthritis    fingers  . Colon polyp   . Diabetes mellitus   . Diverticulosis   . GERD (gastroesophageal reflux disease)    occasionally  . H/O hiatal hernia    ?  Marland Kitchen Hemorrhoids   . Hyperlipidemia    takes fish oil  . Hypertension   . Infectious colitis   . Ventral hernia      Social History   Social History  . Marital status: Divorced    Spouse name: N/A  . Number of children: 1  . Years of education: N/A   Occupational History  . assessment specialist     city of [place]   Social History Main Topics  . Smoking status: Former Smoker    Packs/day: 1.00    Years: 20.00    Types: Cigarettes    Quit date: 01/05/1996  . Smokeless tobacco: Never Used  . Alcohol use No  . Drug use: No  . Sexual activity: Not on file   Other Topics Concern  . Not on file   Social History Narrative  . No narrative on file    Past Surgical History:  Procedure Laterality Date  . colonscopy    . DIAGNOSTIC LAPAROSCOPY    . FOOT SURGERY Bilateral    hammer toes  . HEMORRHOID SURGERY    . HERNIA REPAIR  01/20/2011   incisional  . INCISIONAL HERNIA REPAIR  01/20/2011   Procedure: LAPAROSCOPIC INCISIONAL HERNIA;  Surgeon: Judieth Keens, DO;  Location: [facility];  Service: General;  Laterality: N/A;  Laparoscopic repair of a recurrent incisional hernia with Mesh  . INGUINAL HERNIA REPAIR     x's 3  . VENTRAL HERNIA REPAIR      Family History  Problem Relation Age of Onset  . Breast cancer Mother     . Diabetes Mother   . Dementia Mother   . Liver cancer Mother   . Heart disease Father        heart attack  . Diabetes Brother   . Breast cancer Sister     Allergies  Allergen Reactions  . Aspirin Nausea Only    Current Outpatient Prescriptions on File Prior to Visit  Medication Sig Dispense Refill  . albuterol (PROVENTIL HFA;VENTOLIN HFA) 108 (90 BASE) MCG/ACT inhaler Inhale 2 puffs into the lungs every 6 (six) hours as needed for wheezing or shortness of breath. 1 Inhaler 0  . amLODipine (NORVASC) 5 MG tablet TAKE 1 TABLET BY MOUTH DAILY 90 tablet 0  . Fish Oil-Cholecalciferol (FISH OIL + D3 PO) Take 1 capsule by mouth daily.    Marland Kitchen JANUMET 50-1000 MG tablet TAKE ONE TABLET BY MOUTH TWICE DAILY WITH MEALS 60 tablet 2  . losartan (COZAAR) 25 MG tablet Take 1 tablet by mouth daily.    . Multiple Vitamin (MULTIVITAMIN) tablet Take 1 tablet by mouth daily. OVER 50 VITAMIN    . Multiple Vitamins-Minerals (HAIR/SKIN/NAILS/BIOTIN PO) Take 1  tablet by mouth daily.    . beclomethasone (QVAR) 40 MCG/ACT inhaler Inhale 2 puffs into the lungs 2 (two) times daily. (Patient not taking: Reported on 06/19/2016) 1 Inhaler 12   No current facility-administered medications on file prior to visit.     BP 131/72 (BP Location: Right Arm, Cuff Size: Large)   Pulse 78   Temp 98.3 F (36.8 C) (Oral)   Resp 16   Ht 5\' 5"  (1.651 m)   Wt 187 lb (84.8 kg)   SpO2 100%   BMI 31.12 kg/m       Objective:   Physical Exam  Constitutional: She is oriented to person, place, and time. She appears well-developed and well-nourished.  HENT:  Head: Normocephalic and atraumatic.  Right Ear: Tympanic membrane and ear canal normal.  Left Ear: Tympanic membrane and ear canal normal.  Mouth/Throat: No oropharyngeal exudate, posterior oropharyngeal edema or posterior oropharyngeal erythema.  Neck:  Right thyroid enlargement noted  Cardiovascular: Normal rate, regular rhythm and normal heart sounds.   No murmur  heard. Pulmonary/Chest: Effort normal and breath sounds normal. No respiratory distress. She has no wheezes.  Neurological: She is alert and oriented to person, place, and time.  Skin: Skin is warm and dry.  Psychiatric: She has a normal mood and affect. Her behavior is normal. Judgment and thought content normal.          Assessment & Plan:  Bronchitis- offered zpak, pt notes that it "does not work for me." Will rx with doxycycline. Will also give tessalon prn cough.  Pt is advised to call if symptoms worsen or if they fail to improve.    Right thyroid nodule- chart reviewed-Has hx of right sided thyroid nodule and it has been biopsied in the past.

## 2016-06-29 ENCOUNTER — Telehealth: Payer: Self-pay | Admitting: Family Medicine

## 2016-06-29 MED ORDER — FLUCONAZOLE 150 MG PO TABS: ORAL_TABLET | ORAL | 0 refills | Status: DC

## 2016-06-29 NOTE — Telephone Encounter (Signed)
Patient informed and sent to pharmacy

## 2016-06-29 NOTE — Telephone Encounter (Signed)
Patient called in she is needing something for a yeast infection. She said when she takes antibiotics she get them, she uses [facility] on [address]   [phone] work number

## 2016-06-29 NOTE — Telephone Encounter (Signed)
Diflucan 150 mg #2  1 po qd x1, may repeat in 3 days prn

## 2016-07-06 ENCOUNTER — Encounter: Payer: Self-pay | Admitting: Gastroenterology

## 2016-08-07 ENCOUNTER — Other Ambulatory Visit: Payer: Self-pay | Admitting: Family Medicine

## 2016-08-07 NOTE — Progress Notes (Signed)
Megan [name] was seen today in neurologic consultation at the request of [name], DO.  The consultation is for the evaluation of "twitching."  Pt reports that it is in her shoulders.  She had it when she was younger ([age] years old) and it went away when she was in high school but then came back about a year ago.  It occurs intermittently.  It happens in both shoulders.  Stress will bring it out.  They are short handed at work and it seems to be worse then.  She wonders if it happens in the sleep as she is sore in the shoulders when she wakes up.  No one sleeps in the bed with her.  She thinks that maybe she can control it but it is so intermittent.  No fam hx of abnormal movements and no abnormal movements elsewhere.  No new medications.  Supposed to start a new DM med but hasn't yet.  Only shoulder moves and nothing else.  No gait changes.  No swallowing trouble.  No speech change.  Neuroimaging has not previously been performed.    08/21/16 update:  Patient is seen in follow-up.  She had extensive lab work done since last visit.  Urinary copper was not done as she did not provide a urine sample.  Her lab work was positive for gliadin antibodies.  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  They felt she had no symptoms consistent with this.   her B12 was slightly low on lab work.  Pt states that she has the shoulder movements when she is stressed and it seems to go one into the other shoulder.  Had same movements as a child but went away for years.  No falls.  No speech trouble.  Under more stress because sold her house in 1 day and didn't have another so living with her sister temporarily.    ALLERGIES:   Allergies  Allergen Reactions  . Aspirin Nausea Only    CURRENT MEDICATIONS:  Outpatient Encounter Prescriptions as of 08/21/2016  Medication Sig  . albuterol (PROVENTIL HFA;VENTOLIN HFA) 108 (90 BASE) MCG/ACT inhaler Inhale 2 puffs into the lungs  every 6 (six) hours as needed for wheezing or shortness of breath.  Marland Kitchen amLODipine (NORVASC) 5 MG tablet TAKE 1 TABLET BY MOUTH ONCE DAILY  . beclomethasone (QVAR) 40 MCG/ACT inhaler Inhale 2 puffs into the lungs 2 (two) times daily.  . Fish Oil-Cholecalciferol (FISH OIL + D3 PO) Take 1 capsule by mouth daily.  Marland Kitchen JANUMET 50-1000 MG tablet TAKE 1 TABLET BY MOUTH TWICE DAILY WITH MEALS  . losartan (COZAAR) 25 MG tablet Take 1 tablet by mouth daily.  . Multiple Vitamin (MULTIVITAMIN) tablet Take 1 tablet by mouth daily. OVER 50 VITAMIN  . [DISCONTINUED] amLODipine (NORVASC) 5 MG tablet TAKE 1 TABLET BY MOUTH DAILY  . [DISCONTINUED] benzonatate (TESSALON) 100 MG capsule Take 1 capsule (100 mg total) by mouth 3 (three) times daily as needed.  . [DISCONTINUED] doxycycline (VIBRA-TABS) 100 MG tablet Take 1 tablet (100 mg total) by mouth 2 (two) times daily.  . [DISCONTINUED] fluconazole (DIFLUCAN) 150 MG tablet Take 1 by mouth once and repeat in 3 days if needed.  . [DISCONTINUED] JANUMET 50-1000 MG tablet TAKE ONE TABLET BY MOUTH TWICE DAILY WITH MEALS  . [DISCONTINUED] Multiple Vitamins-Minerals (HAIR/SKIN/NAILS/BIOTIN PO) Take 1 tablet by mouth daily.   No facility-administered encounter medications on file as of 08/21/2016.  PAST MEDICAL HISTORY:   Past Medical History:  Diagnosis Date  . Allergy   . Arthritis    fingers  . Colon polyp   . Diabetes mellitus   . Diverticulosis   . GERD (gastroesophageal reflux disease)    occasionally  . H/O hiatal hernia    ?  Marland Kitchen Hemorrhoids   . Hyperlipidemia    takes fish oil  . Hypertension   . Infectious colitis   . Ventral hernia     PAST SURGICAL HISTORY:   Past Surgical History:  Procedure Laterality Date  . colonscopy    . DIAGNOSTIC LAPAROSCOPY    . FOOT SURGERY Bilateral    hammer toes  . HEMORRHOID SURGERY    . HERNIA REPAIR  01/20/2011   incisional  . INCISIONAL HERNIA REPAIR  01/20/2011   Procedure: LAPAROSCOPIC INCISIONAL  HERNIA;  Surgeon: Judieth Keens, DO;  Location: [facility];  Service: General;  Laterality: N/A;  Laparoscopic repair of a recurrent incisional hernia with Mesh  . INGUINAL HERNIA REPAIR     x's 3  . VENTRAL HERNIA REPAIR      SOCIAL HISTORY:   Social History   Social History  . Marital status: Divorced    Spouse name: N/A  . Number of children: 1  . Years of education: N/A   Occupational History  . assessment specialist     city of [place]   Social History Main Topics  . Smoking status: Former Smoker    Packs/day: 1.00    Years: 20.00    Types: Cigarettes    Quit date: 01/05/1996  . Smokeless tobacco: Never Used  . Alcohol use No  . Drug use: No  . Sexual activity: Not on file   Other Topics Concern  . Not on file   Social History Narrative  . No narrative on file    FAMILY HISTORY:   Family Status  Relation Status  . Mother Deceased  . Father Deceased at age 79  . Brother (Not Specified)  . Sister (Not Specified)    ROS:  No lateralizing weakness/paresthesia.  No swallowing trouble.  No trouble speaking.  A complete 10 system review of systems was obtained and was unremarkable apart from what is mentioned above.  PHYSICAL EXAMINATION:    VITALS:   Vitals:   08/21/16 1411  BP: 120/60  Pulse: 80  Weight: 191 lb (86.6 kg)  Height: 5\' 5"  (1.651 m)    GEN:  Normal appears female in no acute distress.  Appears stated age. HEENT:  Normocephalic, atraumatic. The mucous membranes are moist. The superficial temporal arteries are without ropiness or tenderness. Cardiovascular: Regular rate and rhythm. Lungs: Clear to auscultation bilaterally. Neck/Heme: There are no carotid bruits noted bilaterally.  NEUROLOGICAL: Orientation:  The patient is alert and oriented x 3.   Cranial nerves: There is good facial symmetry.  Speech is fluent and clear. Soft palate rises symmetrically and there is no tongue deviation. Hearing is intact to conversational tone. Tone:  Tone is good throughout. Sensation: Sensation is intact to light touch  Coordination:  The patient has no difficulty with RAM's or FNF bilaterally. Motor: Strength is 5/5 in the bilateral upper and lower extremities.  Shoulder shrug is equal and symmetric. There is no pronator drift.  There are no fasciculations noted. DTR's: Deep tendon reflexes are 2/4 at the bilateral biceps, triceps, brachioradialis, patella and achilles.  Plantar responses are downgoing bilaterally. Gait and Station: The patient is able to ambulate without  difficulty. The patient is able to heel toe walk without any difficulty. The patient is able to ambulate in a tandem fashion. The patient is able to stand in the Romberg position. Abnormal movements: I did not note any abnormal movements during her examination today.  She stated that she did not have any.    LABS:    Chemistry      Component Value Date/Time   NA 140 11/08/2015 1608   K 3.8 11/08/2015 1608   CL 103 11/08/2015 1608   CO2 30 11/08/2015 1608   BUN 13 11/08/2015 1608   CREATININE 0.69 11/08/2015 1608   CREATININE 0.73 04/26/2012 1539      Component Value Date/Time   CALCIUM 9.5 12/20/2015 0956   ALKPHOS 72 11/08/2015 1608   AST 26 11/08/2015 1608   ALT 35 11/08/2015 1608   BILITOT 0.7 11/08/2015 1608     Lab Results  Component Value Date   TSH 0.92 11/08/2015   Lab Results  Component Value Date   VITAMINB12 368 12/20/2015   Lab Results  Component Value Date   HGBA1C 7.2 (H) 11/08/2015   Lab Results  Component Value Date   WBC 11.1 (H) 11/08/2015   HGB 12.3 11/08/2015   HCT 38.8 11/08/2015   MCV 68.1 Repeated and verified X2. (L) 11/08/2015   PLT 370.0 11/08/2015   Lab Results  Component Value Date   ANA NEG 12/20/2015   Lab Results  Component Value Date   VITAMINB12 368 12/20/2015     IMPRESSION/PLAN  1. Abnormal involuntary movements  -probably a simple motor tic.  I still haven't noted any on examination in the last 2  visits.  Pt has no other neuro s/s.  Offered her klonopin but she really didn't want anything.  She has valium that she rarely uses.  Encouraged her to call me if new neuro sx's or if anything worsens.    2.  Mild b12 deficiency  -will start oral b12 supplements  3.  F/u prn    Cc:  [name], DO

## 2016-08-20 ENCOUNTER — Other Ambulatory Visit: Payer: Self-pay | Admitting: Family Medicine

## 2016-08-21 ENCOUNTER — Encounter: Payer: Self-pay | Admitting: Neurology

## 2016-08-21 ENCOUNTER — Other Ambulatory Visit: Payer: Self-pay | Admitting: Family Medicine

## 2016-08-21 ENCOUNTER — Ambulatory Visit (INDEPENDENT_AMBULATORY_CARE_PROVIDER_SITE_OTHER): Payer: 59 | Admitting: Neurology

## 2016-08-21 VITALS — BP 120/60 | HR 80 | Ht 65.0 in | Wt 191.0 lb

## 2016-08-21 DIAGNOSIS — R259 Unspecified abnormal involuntary movements: Secondary | ICD-10-CM | POA: Diagnosis not present

## 2016-08-21 DIAGNOSIS — E538 Deficiency of other specified B group vitamins: Secondary | ICD-10-CM

## 2016-08-21 NOTE — Patient Instructions (Signed)
Take B12 - 1055mcg daily  Call me if symptoms increase or if you need a follow up visit

## 2016-08-28 ENCOUNTER — Encounter: Payer: Self-pay | Admitting: Gastroenterology

## 2016-10-11 ENCOUNTER — Ambulatory Visit (AMBULATORY_SURGERY_CENTER): Payer: Self-pay | Admitting: *Deleted

## 2016-10-11 VITALS — Ht 65.0 in | Wt 190.0 lb

## 2016-10-11 DIAGNOSIS — Z86010 Personal history of colonic polyps: Secondary | ICD-10-CM

## 2016-10-11 MED ORDER — NA SULFATE-K SULFATE-MG SULF 17.5-3.13-1.6 GM/177ML PO SOLN: ORAL | 0 refills | Status: DC

## 2016-10-11 NOTE — Progress Notes (Signed)
Patient denies any allergies to eggs or soy. Patient denies any problems with anesthesia/sedation. Patient denies any oxygen use at home and does not take any diet/weight loss medications. EMMI education assisgned to patient on colonoscopy, this was explained and instructions given to patient. 2 day prep instructions given to patient during PV.

## 2016-10-13 ENCOUNTER — Encounter: Payer: Self-pay | Admitting: Gastroenterology

## 2016-10-16 ENCOUNTER — Ambulatory Visit (INDEPENDENT_AMBULATORY_CARE_PROVIDER_SITE_OTHER): Payer: 59 | Admitting: Family

## 2016-10-16 ENCOUNTER — Encounter: Payer: Self-pay | Admitting: Family

## 2016-10-16 VITALS — BP 117/66 | HR 79 | Temp 98.5°F | Resp 18 | Ht 65.0 in | Wt 191.8 lb

## 2016-10-16 DIAGNOSIS — E049 Nontoxic goiter, unspecified: Secondary | ICD-10-CM

## 2016-10-16 DIAGNOSIS — R1310 Dysphagia, unspecified: Secondary | ICD-10-CM | POA: Diagnosis not present

## 2016-10-16 MED ORDER — OMEPRAZOLE 40 MG PO CPDR
40.0000 mg | DELAYED_RELEASE_CAPSULE | Freq: Every day | ORAL | 0 refills | Status: DC
Start: 2016-10-16 — End: 2017-02-05

## 2016-10-16 NOTE — Patient Instructions (Signed)
You will be contacted for scheduling your thyroid ultrasound. Please begin omeprazole, reflux medication, to see if this helps with your swallowing issues. Call if new or worsening symptoms.  Follow-up with Dr.Lowne in 1 month.

## 2016-10-16 NOTE — Progress Notes (Signed)
Subjective:    Patient ID: Megan [name], female    DOB: 1955-03-23, 61 y.o.   MRN: [id number]  HPI  Megan [name] is a 61 yr old female who presents today with c/o throat fullness. Sometimes feels like her pill "gets stuck." No trouble swallowing food.  Mild cough, no fevers.      Review of Systems See HPI  Past Medical History:  Diagnosis Date  . Allergy   . Arthritis    fingers  . Colon polyp   . Diabetes mellitus   . Diverticulosis   . GERD (gastroesophageal reflux disease)    occasionally  . H/O hiatal hernia    ?  Marland Kitchen Hemorrhoids   . Hyperlipidemia    takes fish oil  . Hypertension   . Infectious colitis   . Ventral hernia      Social History   Social History  . Marital status: Divorced    Spouse name: N/A  . Number of children: 1  . Years of education: N/A   Occupational History  . 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   Social History Main Topics  . Smoking status: Former Smoker    Packs/day: 1.00    Years: 20.00    Types: Cigarettes    Quit date: 01/05/1996  . Smokeless tobacco: Never Used  . Alcohol use 2.4 oz/week    4 Glasses of wine per week  . Drug use: No  . Sexual activity: Not on file   Other Topics Concern  . Not on file   Social History Narrative  . No narrative on file    Past Surgical History:  Procedure Laterality Date  . colonscopy    . DIAGNOSTIC LAPAROSCOPY    . FOOT SURGERY Bilateral    hammer toes  . HEMORRHOID SURGERY    . HERNIA REPAIR  01/20/2011   incisional  . INCISIONAL HERNIA REPAIR  01/20/2011   Procedure: LAPAROSCOPIC INCISIONAL HERNIA;  Surgeon: Judieth Keens, DO;  Location: [facility];  Service: General;  Laterality: N/A;  Laparoscopic repair of a recurrent incisional hernia with Mesh  . INGUINAL HERNIA REPAIR     x's 3  . VENTRAL HERNIA REPAIR      Family History  Problem Relation Age of Onset  . Breast cancer Mother   . Diabetes Mother   . Dementia Mother   . Liver cancer Mother   .  Heart disease Father        heart attack  . Diabetes Brother   . Breast cancer Sister   . Colon cancer Neg Hx   . Stomach cancer Neg Hx     Allergies  Allergen Reactions  . Aspirin Nausea Only    Current Outpatient Prescriptions on File Prior to Visit  Medication Sig Dispense Refill  . amLODipine (NORVASC) 5 MG tablet TAKE 1 TABLET BY MOUTH ONCE DAILY 90 tablet 0  . Cyanocobalamin (VITAMIN B 12 PO) Take 1 tablet by mouth daily.    . Fish Oil-Cholecalciferol (FISH OIL + D3 PO) Take 1 capsule by mouth daily.    Marland Kitchen JANUMET 50-1000 MG tablet TAKE 1 TABLET BY MOUTH TWICE DAILY WITH MEALS 60 tablet 2  . losartan (COZAAR) 25 MG tablet Take 1 tablet by mouth daily.    . Multiple Vitamin (MULTIVITAMIN) tablet Take 1 tablet by mouth daily. OVER 50 VITAMIN    . Na Sulfate-K Sulfate-Mg Sulf 17.5-3.13-1.6 GM/180ML SOLN Suprep (no substitutions)-TAKE AS DIRECTED. 354 mL 0  No current facility-administered medications on file prior to visit.     BP 117/66 (BP Location: Right Arm, Cuff Size: Large)   Pulse 79   Temp 98.5 F (36.9 C) (Oral)   Resp 18   Ht 5\' 5"  (1.651 m)   Wt 191 lb 12.8 oz (87 kg)   SpO2 99%   BMI 31.92 kg/m       Objective:   Physical Exam  Constitutional: She appears well-developed and well-nourished.  Neck:  Right sided thyroid fullness.   Cardiovascular: Normal rate, regular rhythm and normal heart sounds.   No murmur heard. Pulmonary/Chest: Effort normal and breath sounds normal. No respiratory distress. She has no wheezes.  Lymphadenopathy:    She has no cervical adenopathy.  Psychiatric: She has a normal mood and affect. Her behavior is normal. Judgment and thought content normal.          Assessment & Plan:   Dysphagia- the patient has a known history of goiter with right-sided thyroid nodule. Nodule is apparent today on exam. She had biopsy performed 4 years ago which was benign. Her dysphagia may be secondary to compressive symptoms from the  thyroid enlargement. She has not had any thyroid imaging in 4 years. Will obtain follow-up thyroid ultrasound. I have also advised her on a trial of a proton pump inhibitor. She is advised to follow-up with her PCP in one month. If symptoms are not improved consider GI evaluation.

## 2016-10-25 ENCOUNTER — Encounter: Payer: Self-pay | Admitting: Gastroenterology

## 2016-10-25 ENCOUNTER — Ambulatory Visit (AMBULATORY_SURGERY_CENTER): Payer: 59 | Admitting: Gastroenterology

## 2016-10-25 VITALS — BP 116/74 | HR 72 | Temp 96.8°F | Resp 13 | Ht 65.0 in | Wt 191.0 lb

## 2016-10-25 DIAGNOSIS — Z1212 Encounter for screening for malignant neoplasm of rectum: Secondary | ICD-10-CM

## 2016-10-25 DIAGNOSIS — Z86010 Personal history of colonic polyps: Secondary | ICD-10-CM

## 2016-10-25 DIAGNOSIS — Z1211 Encounter for screening for malignant neoplasm of colon: Secondary | ICD-10-CM | POA: Diagnosis present

## 2016-10-25 MED ORDER — SODIUM CHLORIDE 0.9 % IV SOLN: 500.0000 mL | INTRAVENOUS | Status: DC

## 2016-10-25 NOTE — Progress Notes (Signed)
To PACU, VSS. Report to RN.tb 

## 2016-10-25 NOTE — Op Note (Signed)
[facility] Patient Name: Megan [name] Procedure Date: 10/25/2016 8:26 AM MRN: [id number] Endoscopist: Mallie Mussel L. Loletha Carrow , MD Age: 61 Referring MD:  Date of Birth: Sep 15, 1955 Gender: Female Account #: [id number] Procedure:                Colonoscopy Indications:              Surveillance: Personal history of colonic polyps                            (unknown histology) on colonoscopy in 2006. No                            polyps on 2001 colonoscopy, but inadequate prep on                            that exam. Medicines:                Monitored Anesthesia Care Procedure:                Pre-Anesthesia Assessment:                           - Prior to the procedure, a History and Physical                            was performed, and patient medications and                            allergies were reviewed. The patient's tolerance of                            previous anesthesia was also reviewed. The risks                            and benefits of the procedure and the sedation                            options and risks were discussed with the patient.                            All questions were answered, and informed consent                            was obtained. Prior Anticoagulants: The patient has                            taken no previous anticoagulant or antiplatelet                            agents. ASA Grade Assessment: II - A patient with                            mild systemic disease. After reviewing the risks  and benefits, the patient was deemed in                            satisfactory condition to undergo the procedure.                           After obtaining informed consent, the colonoscope                            was passed under direct vision. Throughout the                            procedure, the patient's blood pressure, pulse, and                            oxygen saturations were monitored continuously. The                             Colonoscope was introduced through the anus and                            advanced to the the cecum, identified by                            appendiceal orifice and ileocecal valve. The                            colonoscopy was performed without difficulty. The                            patient tolerated the procedure well. The quality                            of the bowel preparation was excellent. The                            ileocecal valve, appendiceal orifice, and rectum                            were photographed. The quality of the bowel                            preparation was evaluated using the BBPS [facility]                            Bowel Preparation Scale) with scores of: Right                            Colon = 3, Transverse Colon = 3 and Left Colon = 3                            (entire mucosa seen well with no residual staining,  small fragments of stool or opaque liquid). The                            total BBPS score equals 9. The bowel preparation                            used was SUPREP. Scope In: 8:34:50 AM Scope Out: 8:47:27 AM Scope Withdrawal Time: 0 hours 8 minutes 46 seconds  Total Procedure Duration: 0 hours 12 minutes 37 seconds  Findings:                 The perianal and digital rectal examinations were                            normal.                           Multiple diverticula were found in the entire colon.                           The exam was otherwise without abnormality on                            direct and retroflexion views. Complications:            No immediate complications. Estimated Blood Loss:     Estimated blood loss: none. Impression:               - Diverticulosis in the entire examined colon.                           - The examination was otherwise normal on direct                            and retroflexion views.                           - No specimens  collected. Recommendation:           - Patient has a contact number available for                            emergencies. The signs and symptoms of potential                            delayed complications were discussed with the                            patient. Return to normal activities tomorrow.                            Written discharge instructions were provided to the                            patient.                           -  Resume previous diet.                           - Continue present medications.                           - Repeat colonoscopy in 10 years for screening                            purposes. Haneefah Venturini L. Loletha Carrow, MD 10/25/2016 8:54:05 AM This report has been signed electronically.

## 2016-10-25 NOTE — Progress Notes (Signed)
Pt's states no medical or surgical changes since previsit or office visit. 

## 2016-10-25 NOTE — Patient Instructions (Signed)
YOU HAD AN ENDOSCOPIC PROCEDURE TODAY AT [facility]:   Refer to the procedure report that was given to you for any specific questions about what was found during the examination.  If the procedure report does not answer your questions, please call your gastroenterologist to clarify.  If you requested that your care partner not be given the details of your procedure findings, then the procedure report has been included in a sealed envelope for you to review at your convenience later.  YOU SHOULD EXPECT: Some feelings of bloating in the abdomen. Passage of more gas than usual.  Walking can help get rid of the air that was put into your GI tract during the procedure and reduce the bloating. If you had a lower endoscopy (such as a colonoscopy or flexible sigmoidoscopy) you may notice spotting of blood in your stool or on the toilet paper. If you underwent a bowel prep for your procedure, you may not have a normal bowel movement for a few days.  Please Note:  You might notice some irritation and congestion in your nose or some drainage.  This is from the oxygen used during your procedure.  There is no need for concern and it should clear up in a day or so.  SYMPTOMS TO REPORT IMMEDIATELY:   Following lower endoscopy (colonoscopy or flexible sigmoidoscopy):  Excessive amounts of blood in the stool  Significant tenderness or worsening of abdominal pains  Swelling of the abdomen that is new, acute  Fever of 100F or higher   For urgent or emergent issues, a gastroenterologist can be reached at any hour by calling [phone].   DIET:  We do recommend a small meal at first, but then you may proceed to your regular diet.  Drink plenty of fluids but you should avoid alcoholic beverages for 24 hours.  ACTIVITY:  You should plan to take it easy for the rest of today and you should NOT DRIVE or use heavy machinery until tomorrow (because of the sedation medicines used during the test).     FOLLOW UP: Our staff will call the number listed on your records the next business day following your procedure to check on you and address any questions or concerns that you may have regarding the information given to you following your procedure. If we do not reach you, we will leave a message.  However, if you are feeling well and you are not experiencing any problems, there is no need to return our call.  We will assume that you have returned to your regular daily activities without incident.  If any biopsies were taken you will be contacted by phone or by letter within the next 1-3 weeks.  Please call us at [phone] if you have not heard about the biopsies in 3 weeks.    SIGNATURES/CONFIDENTIALITY: You and/or your care partner have signed paperwork which will be entered into your electronic medical record.  These signatures attest to the fact that that the information above on your After Visit Summary has been reviewed and is understood.  Full responsibility of the confidentiality of this discharge information lies with you and/or your care-partner.  Diverticulosis and high fiber diet information given.  Recall 10 years-2028

## 2016-10-26 ENCOUNTER — Telehealth: Payer: Self-pay | Admitting: *Deleted

## 2016-10-26 NOTE — Telephone Encounter (Signed)
  Follow up Call-  Call back number 10/25/2016  Post procedure Call Back phone  # [phone]  Permission to leave phone message Yes  Some recent data might be hidden     Patient questions:  Do you have a fever, pain , or abdominal swelling? No. Pain Score  0 *  Have you tolerated food without any problems? Yes.    Have you been able to return to your normal activities? Yes.    Do you have any questions about your discharge instructions: Diet   No. Medications  No. Follow up visit  No.  Do you have questions or concerns about your Care? No.  Actions: * If pain score is 4 or above: No action needed, pain <4.

## 2016-10-31 DIAGNOSIS — R3121 Asymptomatic microscopic hematuria: Secondary | ICD-10-CM | POA: Diagnosis not present

## 2016-11-13 ENCOUNTER — Other Ambulatory Visit: Payer: Self-pay | Admitting: Family Medicine

## 2016-12-18 ENCOUNTER — Other Ambulatory Visit: Payer: Self-pay | Admitting: Family Medicine

## 2017-01-01 DIAGNOSIS — N3001 Acute cystitis with hematuria: Secondary | ICD-10-CM | POA: Diagnosis not present

## 2017-01-02 ENCOUNTER — Ambulatory Visit: Payer: 59 | Admitting: Medical

## 2017-01-12 DIAGNOSIS — Z01419 Encounter for gynecological examination (general) (routine) without abnormal findings: Secondary | ICD-10-CM | POA: Diagnosis not present

## 2017-01-12 LAB — HM PAP SMEAR: HPV, high-risk: NEGATIVE

## 2017-01-29 ENCOUNTER — Telehealth: Payer: Self-pay | Admitting: Family Medicine

## 2017-01-29 DIAGNOSIS — I10 Essential (primary) hypertension: Secondary | ICD-10-CM

## 2017-01-29 DIAGNOSIS — E785 Hyperlipidemia, unspecified: Secondary | ICD-10-CM

## 2017-01-29 DIAGNOSIS — E118 Type 2 diabetes mellitus with unspecified complications: Secondary | ICD-10-CM

## 2017-01-29 NOTE — Telephone Encounter (Signed)
Copied from [place] [id number]. Topic: Inquiry >> Jan 29, 2017  2:02 PM Oliver Pila B wrote: Reason for CRM: pt has a cpe in April, and would like a fasting lab order created for her prior to coming in for the visit, contact pt if needed

## 2017-01-30 NOTE — Telephone Encounter (Signed)
Left message on machine that orders have been placed and she just need to call and make a lab only appt before her visit.

## 2017-02-01 NOTE — Progress Notes (Signed)
Megan [name] was seen today in neurologic consultation at the request of [name], DO.  The consultation is for the evaluation of "twitching."  Pt reports that it is in her shoulders.  She had it when she was younger (35-64 years old) and it went away when she was in high school but then came back about a year ago.  It occurs intermittently.  It happens in both shoulders.  Stress will bring it out.  They are short handed at work and it seems to be worse then.  She wonders if it happens in the sleep as she is sore in the shoulders when she wakes up.  No one sleeps in the bed with her.  She thinks that maybe she can control it but it is so intermittent.  No fam hx of abnormal movements and no abnormal movements elsewhere.  No new medications.  Supposed to start a new DM med but hasn't yet.  Only shoulder moves and nothing else.  No gait changes.  No swallowing trouble.  No speech change.  Neuroimaging has not previously been performed.    08/21/16 update:  Patient is seen in follow-up.  She had extensive lab work done since last visit.  Urinary copper was not done as she did not provide a urine sample.  Her lab work was positive for gliadin antibodies.  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  They felt she had no symptoms consistent with this.   her B12 was slightly low on lab work.  Pt states that she has the shoulder movements when she is stressed and it seems to go one into the other shoulder.  Had same movements as a child but went away for years.  No falls.  No speech trouble.  Under more stress because sold her house in 1 day and didn't have another so living with her sister temporarily.    02/05/17 update:  Pt seen in f/u.  She wanted to make a f/u appointment because she thinks that her sx's are getting worse.  She states that if she is stressed, she will feel a stress in the shoulders and if she pays attention to it she can control the jerking of the  shoulder.  It never happens when at home or on the weekends, when she is not working.  Work has become more stressful.  She did quit drinking EtOH last October.  Still living with her sister which is stressful  ALLERGIES:   Allergies  Allergen Reactions  . Aspirin Nausea Only    CURRENT MEDICATIONS:  Outpatient Encounter Medications as of 02/05/2017  Medication Sig  . amLODipine (NORVASC) 5 MG tablet TAKE 1 TABLET BY MOUTH ONCE DAILY  . Cyanocobalamin (VITAMIN B 12 PO) Take 1 tablet by mouth daily.  . Fish Oil-Cholecalciferol (FISH OIL + D3 PO) Take 1 capsule by mouth daily.  Marland Kitchen JANUMET 50-1000 MG tablet TAKE 1 TABLET BY MOUTH TWICE DAILY WITH MEALS  . losartan (COZAAR) 25 MG tablet Take 1 tablet by mouth daily.  . Multiple Vitamin (MULTIVITAMIN) tablet Take 1 tablet by mouth daily. OVER 50 VITAMIN  . [DISCONTINUED] omeprazole (PRILOSEC) 40 MG capsule Take 1 capsule (40 mg total) by mouth daily.   No facility-administered encounter medications on file as of 02/05/2017.     PAST MEDICAL HISTORY:   Past Medical History:  Diagnosis Date  . Allergy   . Arthritis    fingers  . Colon  polyp   . Diabetes mellitus   . Diverticulosis   . GERD (gastroesophageal reflux disease)    occasionally  . H/O hiatal hernia    ?  Marland Kitchen Hemorrhoids   . Hyperlipidemia    takes fish oil  . Hypertension   . Infectious colitis   . Ventral hernia     PAST SURGICAL HISTORY:   Past Surgical History:  Procedure Laterality Date  . colonscopy    . DIAGNOSTIC LAPAROSCOPY    . FOOT SURGERY Bilateral    hammer toes  . HEMORRHOID SURGERY    . HERNIA REPAIR  01/20/2011   incisional  . INCISIONAL HERNIA REPAIR  01/20/2011   Procedure: LAPAROSCOPIC INCISIONAL HERNIA;  Surgeon: Judieth Keens, DO;  Location: [facility];  Service: General;  Laterality: N/A;  Laparoscopic repair of a recurrent incisional hernia with Mesh  . INGUINAL HERNIA REPAIR     x's 3  . VENTRAL HERNIA REPAIR      SOCIAL HISTORY:     Social History   Socioeconomic History  . Marital status: Divorced    Spouse name: Not on file  . Number of children: 1  . Years of education: Not on file  . Highest education level: Not on file  Social Needs  . Financial resource strain: Not on file  . Food insecurity - worry: Not on file  . Food insecurity - inability: Not on file  . Transportation needs - medical: Not on file  . Transportation needs - non-medical: Not on file  Occupational History  . Occupation: assessment specialist    Comment: city of [place]  Tobacco Use  . Smoking status: Former Smoker    Packs/day: 1.00    Years: 20.00    Pack years: 20.00    Types: Cigarettes    Last attempt to quit: 01/05/1996    Years since quitting: 21.1  . Smokeless tobacco: Never Used  Substance and Sexual Activity  . Alcohol use: Yes    Alcohol/week: 2.4 oz    Types: 4 Glasses of wine per week  . Drug use: No  . Sexual activity: Not on file  Other Topics Concern  . Not on file  Social History Narrative  . Not on file    FAMILY HISTORY:   Family Status  Relation Name Status  . Mother  Deceased  . Father  Deceased at age 41  . Brother  (Not Specified)  . Sister  (Not Specified)  . Neg Hx  (Not Specified)    ROS:  No lateralizing weakness/paresthesia.  No swallowing trouble.  No trouble speaking.  A complete 10 system review of systems was obtained and was unremarkable apart from what is mentioned above.  PHYSICAL EXAMINATION:    VITALS:   Vitals:   02/05/17 1041  BP: 132/78  Pulse: 94  SpO2: 97%  Weight: 192 lb (87.1 kg)  Height: 5\' 5"  (1.651 m)    GEN:  Normal appears female in no acute distress.  Appears stated age. HEENT:  Normocephalic, atraumatic. The mucous membranes are moist. The superficial temporal arteries are without ropiness or tenderness. Cardiovascular: Regular rate and rhythm. Lungs: Clear to auscultation bilaterally. Neck/Heme: There are no carotid bruits noted  bilaterally.  NEUROLOGICAL: Orientation:  The patient is alert and oriented x 3.   Cranial nerves: There is good facial symmetry.  Speech is fluent and clear. Soft palate rises symmetrically and there is no tongue deviation. Hearing is intact to conversational tone. Tone: Tone is good throughout.  Sensation: Sensation is intact to light touch  Coordination:  The patient has no difficulty with RAM's or FNF bilaterally. Motor: Strength is 5/5 in the bilateral upper and lower extremities.  Shoulder shrug is equal and symmetric. There is no pronator drift.  There are no fasciculations noted. Gait and Station: The patient is able to ambulate without difficulty Abnormal movements: none noted during the examination today  LABS:    Chemistry      Component Value Date/Time   NA 140 11/08/2015 1608   K 3.8 11/08/2015 1608   CL 103 11/08/2015 1608   CO2 30 11/08/2015 1608   BUN 13 11/08/2015 1608   CREATININE 0.69 11/08/2015 1608   CREATININE 0.73 04/26/2012 1539      Component Value Date/Time   CALCIUM 9.5 12/20/2015 0956   ALKPHOS 72 11/08/2015 1608   AST 26 11/08/2015 1608   ALT 35 11/08/2015 1608   BILITOT 0.7 11/08/2015 1608     Lab Results  Component Value Date   TSH 0.92 11/08/2015   Lab Results  Component Value Date   VITAMINB12 368 12/20/2015   Lab Results  Component Value Date   HGBA1C 7.2 (H) 11/08/2015   Lab Results  Component Value Date   WBC 11.1 (H) 11/08/2015   HGB 12.3 11/08/2015   HCT 38.8 11/08/2015   MCV 68.1 Repeated and verified X2. (L) 11/08/2015   PLT 370.0 11/08/2015   Lab Results  Component Value Date   ANA NEG 12/20/2015   Lab Results  Component Value Date   VITAMINB12 368 12/20/2015     IMPRESSION/PLAN  1. Abnormal involuntary movements  -I have seen her several times now and have never seen the movements.  They really only occur at work when she is stressed.  She had motor tics when she was a child and this may represent a simple  motor tic, which can come out when stressed.  We talked about various treatments, but she really wants no medication for this.  She and I talked about biofeedback, but insurance may not pay for that.  She is interested in it and would like to see if insurance would pay for that.  I will send a referral to integrative therapies.  If not, I do think that cognitive behavioral therapy may be of value for her, in an effort to manage reaction to stress better while at work.  She may be willing to do this if her insurance does not pay for cognitive behavioral therapy.  She will call me once integrative therapies calls her, if her insurance does not pay for this.  2.  Mild b12 deficiency  -She is now on a B12 supplement.  3.  Follow-up will be as needed.    Cc:  [name], DO

## 2017-02-05 ENCOUNTER — Encounter: Payer: Self-pay | Admitting: Neurology

## 2017-02-05 ENCOUNTER — Ambulatory Visit (INDEPENDENT_AMBULATORY_CARE_PROVIDER_SITE_OTHER): Payer: 59 | Admitting: Neurology

## 2017-02-05 VITALS — BP 132/78 | HR 94 | Ht 65.0 in | Wt 192.0 lb

## 2017-02-05 DIAGNOSIS — R259 Unspecified abnormal involuntary movements: Secondary | ICD-10-CM

## 2017-02-05 DIAGNOSIS — F959 Tic disorder, unspecified: Secondary | ICD-10-CM

## 2017-02-05 DIAGNOSIS — F411 Generalized anxiety disorder: Secondary | ICD-10-CM | POA: Diagnosis not present

## 2017-02-18 ENCOUNTER — Other Ambulatory Visit: Payer: Self-pay | Admitting: Family Medicine

## 2017-02-19 DIAGNOSIS — G4709 Other insomnia: Secondary | ICD-10-CM | POA: Diagnosis not present

## 2017-02-25 ENCOUNTER — Other Ambulatory Visit: Payer: Self-pay | Admitting: Family Medicine

## 2017-04-12 DIAGNOSIS — J101 Influenza due to other identified influenza virus with other respiratory manifestations: Secondary | ICD-10-CM | POA: Diagnosis not present

## 2017-04-16 ENCOUNTER — Encounter: Payer: Self-pay | Admitting: Family Medicine

## 2017-04-16 ENCOUNTER — Ambulatory Visit (INDEPENDENT_AMBULATORY_CARE_PROVIDER_SITE_OTHER): Payer: 59 | Admitting: Family Medicine

## 2017-04-16 VITALS — BP 138/70 | HR 95 | Temp 98.1°F | Resp 16 | Ht 65.0 in | Wt 193.6 lb

## 2017-04-16 DIAGNOSIS — J40 Bronchitis, not specified as acute or chronic: Secondary | ICD-10-CM | POA: Diagnosis not present

## 2017-04-16 DIAGNOSIS — R509 Fever, unspecified: Secondary | ICD-10-CM | POA: Diagnosis not present

## 2017-04-16 LAB — POC INFLUENZA A&B (BINAX/QUICKVUE)
Influenza A, POC: NEGATIVE
Influenza B, POC: NEGATIVE

## 2017-04-16 MED ORDER — DOXYCYCLINE HYCLATE 100 MG PO TABS: 100.0000 mg | ORAL_TABLET | Freq: Two times a day (BID) | ORAL | 0 refills | Status: DC

## 2017-04-16 NOTE — Progress Notes (Signed)
Subjective:  I acted as a Education administrator for [employer]. Megan [name], Megan [name]   Patient ID: Megan [name], female    DOB: 1955/10/14, 62 y.o.   MRN: [id number]  Chief Complaint  Patient presents with  . possible flu    HPI  Patient is in today for flu like symptoms.  [facility] and dx with flu  She was given xofluza and feels no better.  She is now coughing up yellow mucous.  No more fever.  She is only occasionally taking mucinex.    Patient Care Team: Megan [name], Megan Apa, DO as PCP - General Megan Chance, NP as Nurse Practitioner (Obstetrics and Gynecology)   Past Medical History:  Diagnosis Date  . Allergy   . Arthritis    fingers  . Colon polyp   . Diabetes mellitus   . Diverticulosis   . GERD (gastroesophageal reflux disease)    occasionally  . H/O hiatal hernia    ?  Marland Kitchen Hemorrhoids   . Hyperlipidemia    takes fish oil  . Hypertension   . Infectious colitis   . Ventral hernia     Past Surgical History:  Procedure Laterality Date  . colonscopy    . DIAGNOSTIC LAPAROSCOPY    . FOOT SURGERY Bilateral    hammer toes  . HEMORRHOID SURGERY    . HERNIA REPAIR  01/20/2011   incisional  . INCISIONAL HERNIA REPAIR  01/20/2011   Procedure: LAPAROSCOPIC INCISIONAL HERNIA;  Surgeon: Megan Keens, DO;  Location: [facility];  Service: General;  Laterality: N/A;  Laparoscopic repair of a recurrent incisional hernia with Mesh  . INGUINAL HERNIA REPAIR     x's 3  . VENTRAL HERNIA REPAIR      Family History  Problem Relation Age of Onset  . Breast cancer Mother   . Diabetes Mother   . Dementia Mother   . Liver cancer Mother   . Heart disease Father        heart attack  . Diabetes Brother   . Breast cancer Sister   . Colon cancer Neg Hx   . Stomach cancer Neg Hx     Social History   Socioeconomic History  . Marital status: Divorced    Spouse name: Not on file  . Number of children: 1  . Years of education: Not on file  . Highest education  level: Not on file  Occupational History  . Occupation: assessment specialist    Comment: city of [employer]  Social Needs  . Financial resource strain: Not on file  . Food insecurity:    Worry: Not on file    Inability: Not on file  . Transportation needs:    Medical: Not on file    Non-medical: Not on file  Tobacco Use  . Smoking status: Former Smoker    Packs/day: 1.00    Years: 20.00    Pack years: 20.00    Types: Cigarettes    Last attempt to quit: 01/05/1996    Years since quitting: 21.2  . Smokeless tobacco: Never Used  Substance and Sexual Activity  . Alcohol use: Yes    Alcohol/week: 2.4 oz    Types: 4 Glasses of wine per week  . Drug use: No  . Sexual activity: Not on file  Lifestyle  . Physical activity:    Days per week: Not on file    Minutes per session: Not on file  . Stress: Not on file  Relationships  . Social connections:    Talks on phone: Not on file    Gets together: Not on file    Attends religious service: Not on file    Active member of club or organization: Not on file    Attends meetings of clubs or organizations: Not on file    Relationship status: Not on file  . Intimate partner violence:    Fear of current or ex partner: Not on file    Emotionally abused: Not on file    Physically abused: Not on file    Forced sexual activity: Not on file  Other Topics Concern  . Not on file  Social History Narrative  . Not on file    Outpatient Medications Prior to Visit  Medication Sig Dispense Refill  . amLODipine (NORVASC) 5 MG tablet TAKE 1 TABLET BY MOUTH ONCE DAILY 90 tablet 0  . amLODipine (NORVASC) 5 MG tablet TAKE 1 TABLET BY MOUTH ONCE DAILY 90 tablet 0  . Cyanocobalamin (VITAMIN B 12 PO) Take 1 tablet by mouth daily.    . Fish Oil-Cholecalciferol (FISH OIL + D3 PO) Take 1 capsule by mouth daily.    Marland Kitchen JANUMET 50-1000 MG tablet TAKE 1 TABLET BY MOUTH TWICE DAILY WITH MEALS 60 tablet 2  . losartan (COZAAR) 25 MG tablet Take 1 tablet by  mouth daily.    . Multiple Vitamin (MULTIVITAMIN) tablet Take 1 tablet by mouth daily. OVER 50 VITAMIN     No facility-administered medications prior to visit.     Allergies  Allergen Reactions  . Aspirin Nausea Only    Review of Systems  Constitutional: Positive for malaise/fatigue. Negative for fever.  HENT: Negative for congestion.   Eyes: Negative for blurred vision.  Respiratory: Positive for cough and sputum production. Negative for shortness of breath.   Cardiovascular: Negative for chest pain, palpitations and leg swelling.  Gastrointestinal: Negative for vomiting.  Musculoskeletal: Negative for back pain.  Skin: Negative for rash.  Neurological: Negative for loss of consciousness and headaches.       Objective:    Physical Exam  Constitutional: She is oriented to person, place, and time. She appears well-developed and well-nourished.  HENT:  Right Ear: External ear normal.  Left Ear: External ear normal.  + PND + errythema  Eyes: Conjunctivae are normal. Right eye exhibits no discharge. Left eye exhibits no discharge.  Cardiovascular: Normal rate, regular rhythm and normal heart sounds.  No murmur heard. Pulmonary/Chest: Effort normal. No respiratory distress. She has decreased breath sounds. She has no wheezes. She has no rales. She exhibits no tenderness.  Musculoskeletal: She exhibits no edema.  Lymphadenopathy:    She has cervical adenopathy.  Neurological: She is alert and oriented to person, place, and time.  Nursing note and vitals reviewed.   BP 138/70 (BP Location: Left Arm, Patient Position: Sitting, Cuff Size: Normal)   Pulse 95   Temp 98.1 F (36.7 C) (Oral)   Resp 16   Ht 5\' 5"  (1.651 m)   Wt 193 lb 9.6 oz (87.8 kg)   SpO2 98%   BMI 32.22 kg/m  Wt Readings from Last 3 Encounters:  04/16/17 193 lb 9.6 oz (87.8 kg)  02/05/17 192 lb (87.1 kg)  10/25/16 191 lb (86.6 kg)   BP Readings from Last 3 Encounters:  04/16/17 138/70  02/05/17  132/78  10/25/16 116/74     Immunization History  Administered Date(s) Administered  . Influenza Whole 11/06/2011  . Pneumococcal Polysaccharide-23 07/01/2013  .  Tdap 05/03/2012  . Zoster 07/01/2013    Health Maintenance  Topic Date Due  . HIV Screening  03/19/1968  . FOOT EXAM  04/21/2015  . OPHTHALMOLOGY EXAM  10/03/2015  . HEMOGLOBIN A1C  05/08/2016  . INFLUENZA VACCINE  08/16/2017  . PNEUMOCOCCAL POLYSACCHARIDE VACCINE (2) 07/02/2018  . MAMMOGRAM  02/05/2019  . PAP SMEAR  01/31/2020  . TETANUS/TDAP  05/04/2022  . COLONOSCOPY  10/26/2026  . Hepatitis C Screening  Completed    Lab Results  Component Value Date   WBC 11.1 (H) 11/08/2015   HGB 12.3 11/08/2015   HCT 38.8 11/08/2015   PLT 370.0 11/08/2015   GLUCOSE 99 11/08/2015   CHOL 158 11/08/2015   TRIG 200.0 (H) 11/08/2015   HDL 50.30 11/08/2015   LDLDIRECT 88.0 04/21/2014   LDLCALC 67 11/08/2015   ALT 35 11/08/2015   AST 26 11/08/2015   NA 140 11/08/2015   K 3.8 11/08/2015   CL 103 11/08/2015   CREATININE 0.69 11/08/2015   BUN 13 11/08/2015   CO2 30 11/08/2015   TSH 0.92 11/08/2015   HGBA1C 7.2 (H) 11/08/2015   MICROALBUR 64.8 (H) 11/02/2014    Lab Results  Component Value Date   TSH 0.92 11/08/2015   Lab Results  Component Value Date   WBC 11.1 (H) 11/08/2015   HGB 12.3 11/08/2015   HCT 38.8 11/08/2015   MCV 68.1 Repeated and verified X2. (L) 11/08/2015   PLT 370.0 11/08/2015   Lab Results  Component Value Date   NA 140 11/08/2015   K 3.8 11/08/2015   CO2 30 11/08/2015   GLUCOSE 99 11/08/2015   BUN 13 11/08/2015   CREATININE 0.69 11/08/2015   BILITOT 0.7 11/08/2015   ALKPHOS 72 11/08/2015   AST 26 11/08/2015   ALT 35 11/08/2015   PROT 7.8 11/08/2015   ALBUMIN 4.2 11/08/2015   CALCIUM 9.5 12/20/2015   GFR 110.64 11/08/2015   Lab Results  Component Value Date   CHOL 158 11/08/2015   Lab Results  Component Value Date   HDL 50.30 11/08/2015   Lab Results  Component Value Date     LDLCALC 67 11/08/2015   Lab Results  Component Value Date   TRIG 200.0 (H) 11/08/2015   Lab Results  Component Value Date   CHOLHDL 3 11/08/2015   Lab Results  Component Value Date   HGBA1C 7.2 (H) 11/08/2015         Assessment & Plan:   Problem List Items Addressed This Visit    None    Visit Diagnoses    Fever, unspecified fever cause    -  Primary   Relevant Orders   POC Influenza A&B (Binax test) (Completed)   Bronchitis       Relevant Medications   doxycycline (VIBRA-TABS) 100 MG tablet    con't mucinex Drink fluids rto prn   I am having Megan [name] start on doxycycline. I am also having her maintain her multivitamin, Fish Oil-Cholecalciferol (FISH OIL + D3 PO), losartan, amLODipine, Cyanocobalamin (VITAMIN B 12 PO), amLODipine, and JANUMET.  Meds ordered this encounter  Medications  . doxycycline (VIBRA-TABS) 100 MG tablet    Sig: Take 1 tablet (100 mg total) by mouth 2 (two) times daily.    Dispense:  20 tablet    Refill:  0    CMA served as scribe during this visit. History, Physical and Plan performed by medical provider. Documentation and orders reviewed and attested to.  [name],  DO

## 2017-04-16 NOTE — Patient Instructions (Signed)

## 2017-04-30 ENCOUNTER — Ambulatory Visit (INDEPENDENT_AMBULATORY_CARE_PROVIDER_SITE_OTHER): Payer: 59 | Admitting: Family Medicine

## 2017-04-30 ENCOUNTER — Encounter: Payer: Self-pay | Admitting: Family Medicine

## 2017-04-30 ENCOUNTER — Ambulatory Visit (HOSPITAL_BASED_OUTPATIENT_CLINIC_OR_DEPARTMENT_OTHER)
Admission: RE | Admit: 2017-04-30 | Discharge: 2017-04-30 | Disposition: A | Discharge status: Home / Self Care | Payer: 59 | Source: Ambulatory Visit | Attending: Family Medicine | Admitting: Family Medicine

## 2017-04-30 ENCOUNTER — Other Ambulatory Visit: Payer: Self-pay | Admitting: *Deleted

## 2017-04-30 VITALS — BP 136/80 | HR 83 | Temp 98.5°F | Resp 16 | Ht 65.0 in | Wt 190.6 lb

## 2017-04-30 DIAGNOSIS — E1165 Type 2 diabetes mellitus with hyperglycemia: Secondary | ICD-10-CM

## 2017-04-30 DIAGNOSIS — E042 Nontoxic multinodular goiter: Secondary | ICD-10-CM | POA: Diagnosis not present

## 2017-04-30 DIAGNOSIS — E1151 Type 2 diabetes mellitus with diabetic peripheral angiopathy without gangrene: Secondary | ICD-10-CM | POA: Diagnosis not present

## 2017-04-30 DIAGNOSIS — Z0001 Encounter for general adult medical examination with abnormal findings: Secondary | ICD-10-CM | POA: Diagnosis not present

## 2017-04-30 DIAGNOSIS — E118 Type 2 diabetes mellitus with unspecified complications: Secondary | ICD-10-CM | POA: Diagnosis not present

## 2017-04-30 DIAGNOSIS — R05 Cough: Secondary | ICD-10-CM | POA: Diagnosis not present

## 2017-04-30 DIAGNOSIS — I10 Essential (primary) hypertension: Secondary | ICD-10-CM | POA: Diagnosis not present

## 2017-04-30 DIAGNOSIS — Z8639 Personal history of other endocrine, nutritional and metabolic disease: Secondary | ICD-10-CM

## 2017-04-30 DIAGNOSIS — J302 Other seasonal allergic rhinitis: Secondary | ICD-10-CM | POA: Diagnosis not present

## 2017-04-30 DIAGNOSIS — E049 Nontoxic goiter, unspecified: Secondary | ICD-10-CM | POA: Diagnosis not present

## 2017-04-30 DIAGNOSIS — R059 Cough, unspecified: Secondary | ICD-10-CM

## 2017-04-30 DIAGNOSIS — E785 Hyperlipidemia, unspecified: Secondary | ICD-10-CM

## 2017-04-30 DIAGNOSIS — IMO0002 Reserved for concepts with insufficient information to code with codable children: Secondary | ICD-10-CM

## 2017-04-30 LAB — CBC WITH DIFFERENTIAL/PLATELET
Basophils Absolute: 0.1 10*3/uL (ref 0.0–0.1)
Basophils Relative: 0.8 % (ref 0.0–3.0)
Eosinophils Absolute: 0.1 10*3/uL (ref 0.0–0.7)
Eosinophils Relative: 1.3 % (ref 0.0–5.0)
HCT: 36.7 % (ref 36.0–46.0)
Hemoglobin: 11.6 g/dL — ABNORMAL LOW (ref 12.0–15.0)
LYMPHS ABS: 2.7 10*3/uL (ref 0.7–4.0)
Lymphocytes Relative: 30.6 % (ref 12.0–46.0)
MCHC: 31.7 g/dL (ref 30.0–36.0)
MONO ABS: 0.7 10*3/uL (ref 0.1–1.0)
MONOS PCT: 7.6 % (ref 3.0–12.0)
NEUTROS ABS: 5.3 10*3/uL (ref 1.4–7.7)
NEUTROS PCT: 59.7 % (ref 43.0–77.0)
PLATELETS: 431 10*3/uL — AB (ref 150.0–400.0)
RBC: 5.52 Mil/uL — AB (ref 3.87–5.11)
RDW: 15.5 % (ref 11.5–15.5)
WBC: 8.9 10*3/uL (ref 4.0–10.5)

## 2017-04-30 LAB — TSH: TSH: 0.76 u[IU]/mL (ref 0.35–4.50)

## 2017-04-30 LAB — LIPID PANEL
CHOL/HDL RATIO: 3
Cholesterol: 148 mg/dL (ref 0–200)
HDL: 48.4 mg/dL (ref >39.00)
LDL CALC: 72 mg/dL (ref 0–99)
NONHDL: 99.81
Triglycerides: 138 mg/dL (ref 0.0–149.0)
VLDL: 27.6 mg/dL (ref 0.0–40.0)

## 2017-04-30 LAB — COMPREHENSIVE METABOLIC PANEL
ALT: 26 U/L (ref 0–35)
AST: 21 U/L (ref 0–37)
Albumin: 4 g/dL (ref 3.5–5.2)
Alkaline Phosphatase: 65 U/L (ref 39–117)
BILIRUBIN TOTAL: 0.7 mg/dL (ref 0.2–1.2)
BUN: 8 mg/dL (ref 6–23)
CHLORIDE: 103 meq/L (ref 96–112)
CO2: 26 meq/L (ref 19–32)
CREATININE: 0.7 mg/dL (ref 0.40–1.20)
Calcium: 9.2 mg/dL (ref 8.4–10.5)
GFR: 108.3 mL/min (ref >60.00)
GLUCOSE: 114 mg/dL — AB (ref 70–99)
Potassium: 4 mEq/L (ref 3.5–5.1)
Sodium: 137 mEq/L (ref 135–145)
Total Protein: 7.7 g/dL (ref 6.0–8.3)

## 2017-04-30 LAB — HEMOGLOBIN A1C: Hgb A1c MFr Bld: 7.3 % — ABNORMAL HIGH (ref 4.6–6.5)

## 2017-04-30 MED ORDER — LOSARTAN POTASSIUM 25 MG PO TABS: 25.0000 mg | ORAL_TABLET | Freq: Every day | ORAL | 1 refills | Status: DC

## 2017-04-30 MED ORDER — MONTELUKAST SODIUM 10 MG PO TABS: 10.0000 mg | ORAL_TABLET | Freq: Every day | ORAL | 3 refills | Status: DC

## 2017-04-30 MED ORDER — SITAGLIPTIN PHOS-METFORMIN HCL 50-1000 MG PO TABS: 1.0000 | ORAL_TABLET | Freq: Two times a day (BID) | ORAL | 1 refills | Status: DC

## 2017-04-30 NOTE — Progress Notes (Signed)
Subjective:     Megan [name] is a 62 y.o. female and is here for a comprehensive physical exam. The patient reports no problems.  HPI HYPERTENSION   Blood pressure range-not checking   Chest pain- no      Dyspnea- no Lightheadedness- no   Edema- no  Other side effects - no   Medication compliance: good Low salt diet- yes    DIABETES    Blood Sugar ranges-130-140  Polyuria- no New Visual problems- no  Hypoglycemic symptoms- no  Other side effects-no Medication compliance - good Last eye exam- 3 weeks ago Foot exam- today     Social History   Socioeconomic History  . Marital status: Divorced    Spouse name: Not on file  . Number of children: 1  . Years of education: Not on file  . Highest education level: Not on file  Occupational History  . Occupation: assessment specialist    Comment: city of [employer]  Social Needs  . Financial resource strain: Not on file  . Food insecurity:    Worry: Not on file    Inability: Not on file  . Transportation needs:    Medical: Not on file    Non-medical: Not on file  Tobacco Use  . Smoking status: Former Smoker    Packs/day: 1.00    Years: 20.00    Pack years: 20.00    Types: Cigarettes    Last attempt to quit: 01/05/1996    Years since quitting: 21.3  . Smokeless tobacco: Never Used  Substance and Sexual Activity  . Alcohol use: Not Currently    Alcohol/week: 2.4 oz    Types: 4 Glasses of wine per week    Frequency: Never  . Drug use: No  . Sexual activity: Not on file  Lifestyle  . Physical activity:    Days per week: Not on file    Minutes per session: Not on file  . Stress: Not on file  Relationships  . Social connections:    Talks on phone: Not on file    Gets together: Not on file    Attends religious service: Not on file    Active member of club or organization: Not on file    Attends meetings of clubs or organizations: Not on file    Relationship status: Not on file  . Intimate partner violence:     Fear of current or ex partner: Not on file    Emotionally abused: Not on file    Physically abused: Not on file    Forced sexual activity: Not on file  Other Topics Concern  . Not on file  Social History Narrative   Exercise 3 times a week   Health Maintenance  Topic Date Due  . HIV Screening  03/19/1968  . FOOT EXAM  04/21/2015  . OPHTHALMOLOGY EXAM  10/03/2015  . HEMOGLOBIN A1C  05/08/2016  . INFLUENZA VACCINE  08/16/2017  . MAMMOGRAM  02/04/2018  . PAP SMEAR  01/31/2020  . TETANUS/TDAP  05/04/2022  . COLONOSCOPY  10/26/2026  . Hepatitis C Screening  Completed    The following portions of the patient's history were reviewed and updated as appropriate:  She  has a past medical history of Allergy, Arthritis, Colon polyp, Diabetes mellitus, Diverticulosis, GERD (gastroesophageal reflux disease), H/O hiatal hernia, Hemorrhoids, Hyperlipidemia, Hypertension, Infectious colitis, and Ventral hernia. She does not have any pertinent problems on file. She  has a past surgical history that includes Inguinal hernia repair; Hemorrhoid  surgery; Ventral hernia repair; Foot surgery (Bilateral); colonscopy; Diagnostic laparoscopy; Incisional hernia repair (01/20/2011); and Hernia repair (01/20/2011). Her family history includes Breast cancer in her mother and sister; Dementia in her mother; Diabetes in her brother and mother; Heart disease in her father; Liver cancer in her mother. She  reports that she quit smoking about 21 years ago. Her smoking use included cigarettes. She has a 20.00 pack-year smoking history. She has never used smokeless tobacco. She reports that she drank about 2.4 oz of alcohol per week. She reports that she does not use drugs. She has a current medication list which includes the following prescription(s): amlodipine, cyanocobalamin, fish oil-cholecalciferol, losartan, montelukast, multivitamin, and sitagliptin-metformin. Current Outpatient Medications on File Prior to Visit   Medication Sig Dispense Refill  . amLODipine (NORVASC) 5 MG tablet TAKE 1 TABLET BY MOUTH ONCE DAILY 90 tablet 0  . Cyanocobalamin (VITAMIN B 12 PO) Take 1 tablet by mouth daily.    . Fish Oil-Cholecalciferol (FISH OIL + D3 PO) Take 1 capsule by mouth daily.    . Multiple Vitamin (MULTIVITAMIN) tablet Take 1 tablet by mouth daily. OVER 50 VITAMIN     No current facility-administered medications on file prior to visit.    She is allergic to aspirin..  Review of Systems Review of Systems  Constitutional: Negative for activity change, appetite change and fatigue.  HENT: Negative for hearing loss, congestion, tinnitus and ear discharge.  dentist q23m Eyes: Negative for visual disturbance (see optho q1y -- vision corrected to 20/20 with glasses).  Respiratory: Negative for cough, chest tightness and shortness of breath.   Cardiovascular: Negative for chest pain, palpitations and leg swelling.  Gastrointestinal: Negative for abdominal pain, diarrhea, constipation and abdominal distention.  Genitourinary: Negative for urgency, frequency, decreased urine volume and difficulty urinating.  Musculoskeletal: Negative for back pain, arthralgias and gait problem.  Skin: Negative for color change, pallor and rash.  Neurological: Negative for dizziness, light-headedness, numbness and headaches.  Hematological: Negative for adenopathy. Does not bruise/bleed easily.  Psychiatric/Behavioral: Negative for suicidal ideas, confusion, sleep disturbance, self-injury, dysphoric mood, decreased concentration and agitation.       Objective:    BP 136/80 (BP Location: Right Arm, Cuff Size: Normal)   Pulse 83   Temp 98.5 F (36.9 C) (Oral)   Resp 16   Ht 5\' 5"  (1.651 m)   Wt 190 lb 9.6 oz (86.5 kg)   SpO2 98%   BMI 31.72 kg/m  General appearance: alert, cooperative, appears stated age and no distress Head: Normocephalic, without obvious abnormality, atraumatic Eyes: negative findings: lids and lashes  normal and pupils equal, round, reactive to light and accomodation Ears: normal TM's and external ear canals both ears Nose: Nares normal. Septum midline. Mucosa normal. No drainage or sinus tenderness. Throat: lips, mucosa, and tongue normal; teeth and gums normal Neck: no adenopathy, no carotid bruit, no JVD, supple, symmetrical, trachea midline and thyroid not enlarged, symmetric, no tenderness/mass/nodules Back: symmetric, no curvature. ROM normal. No CVA tenderness. Lungs: clear to auscultation bilaterally Breasts: gyn Heart: regular rate and rhythm, S1, S2 normal, no murmur, click, rub or gallop Abdomen: soft, non-tender; bowel sounds normal; no masses,  no organomegaly Pelvic: deferred--gyn Extremities: extremities normal, atraumatic, no cyanosis or edema Pulses: 2+ and symmetric Skin: Skin color, texture, turgor normal. No rashes or lesions Lymph nodes: Cervical, supraclavicular, and axillary nodes normal. Neurologic: Alert and oriented X 3, normal strength and tone. Normal symmetric reflexes. Normal coordination and gait    Assessment:  Healthy female exam.     Plan:    gyn utd Check labs  See After Visit Summary for Counseling Recommendations    1. Essential hypertension Stable  - losartan (COZAAR) 25 MG tablet; Take 1 tablet (25 mg total) by mouth daily.  Dispense: 90 tablet; Refill: 1 - CBC with Differential/Platelet - Comprehensive metabolic panel - TSH  2. DM (diabetes mellitus) type II uncontrolled, periph vascular disorder ([place]) hgba1c to be checked, minimize simple carbs. Increase exercise as tolerated. Continue current meds  - sitaGLIPtin-metformin (JANUMET) 50-1000 MG tablet; Take 1 tablet by mouth 2 (two) times daily with a meal.  Dispense: 180 tablet; Refill: 1  3. Hx of thyroid nodule  - US THYROID; Future  4. Seasonal allergies con't antihistamine -- add singulair  - montelukast (SINGULAIR) 10 MG tablet; Take 1 tablet (10 mg total) by mouth at  bedtime.  Dispense: 30 tablet; Refill: 3  5. Cough Check cxr---  Add singulair - DG Chest 2 View; Future - DG Chest 2 View  6. Controlled type 2 diabetes mellitus with complication, without long-term current use of insulin (HCC) Check labs  See above - CBC with Differential/Platelet - Hemoglobin A1c - TSH  7. Hyperlipidemia, unspecified hyperlipidemia type Tolerating statin, encouraged heart healthy diet, avoid trans fats, minimize simple carbs and saturated fats. Increase exercise as tolerated - CBC with Differential/Platelet - Lipid panel - TSH

## 2017-04-30 NOTE — Patient Instructions (Signed)
Preventive Care 40-64 Years, Female Preventive care refers to lifestyle choices and visits with your health care provider that can promote health and wellness. What does preventive care include?  A yearly physical exam. This is also called an annual well check.  Dental exams once or twice a year.  Routine eye exams. Ask your health care provider how often you should have your eyes checked.  Personal lifestyle choices, including: ? Daily care of your teeth and gums. ? Regular physical activity. ? Eating a healthy diet. ? Avoiding tobacco and drug use. ? Limiting alcohol use. ? Practicing safe sex. ? Taking low-dose aspirin daily starting at age 58. ? Taking vitamin and mineral supplements as recommended by your health care provider. What happens during an annual well check? The services and screenings done by your health care provider during your annual well check will depend on your age, overall health, lifestyle risk factors, and family history of disease. Counseling Your health care provider may ask you questions about your:  Alcohol use.  Tobacco use.  Drug use.  Emotional well-being.  Home and relationship well-being.  Sexual activity.  Eating habits.  Work and work Statistician.  Method of birth control.  Menstrual cycle.  Pregnancy history.  Screening You may have the following tests or measurements:  Height, weight, and BMI.  Blood pressure.  Lipid and cholesterol levels. These may be checked every 5 years, or more frequently if you are over 81 years old.  Skin check.  Lung cancer screening. You may have this screening every year starting at age 78 if you have a 30-pack-year history of smoking and currently smoke or have quit within the past 15 years.  Fecal occult blood test (FOBT) of the stool. You may have this test every year starting at age 65.  Flexible sigmoidoscopy or colonoscopy. You may have a sigmoidoscopy every 5 years or a colonoscopy  every 10 years starting at age 30.  Hepatitis C blood test.  Hepatitis B blood test.  Sexually transmitted disease (STD) testing.  Diabetes screening. This is done by checking your blood sugar (glucose) after you have not eaten for a while (fasting). You may have this done every 1-3 years.  Mammogram. This may be done every 1-2 years. Talk to your health care provider about when you should start having regular mammograms. This may depend on whether you have a family history of breast cancer.  BRCA-related cancer screening. This may be done if you have a family history of breast, ovarian, tubal, or peritoneal cancers.  Pelvic exam and Pap test. This may be done every 3 years starting at age 80. Starting at age 36, this may be done every 5 years if you have a Pap test in combination with an HPV test.  Bone density scan. This is done to screen for osteoporosis. You may have this scan if you are at high risk for osteoporosis.  Discuss your test results, treatment options, and if necessary, the need for more tests with your health care provider. Vaccines Your health care provider may recommend certain vaccines, such as:  Influenza vaccine. This is recommended every year.  Tetanus, diphtheria, and acellular pertussis (Tdap, Td) vaccine. You may need a Td booster every 10 years.  Varicella vaccine. You may need this if you have not been vaccinated.  Zoster vaccine. You may need this after age 5.  Measles, mumps, and rubella (MMR) vaccine. You may need at least one dose of MMR if you were born in  1957 or later. You may also need a second dose.  Pneumococcal 13-valent conjugate (PCV13) vaccine. You may need this if you have certain conditions and were not previously vaccinated.  Pneumococcal polysaccharide (PPSV23) vaccine. You may need one or two doses if you smoke cigarettes or if you have certain conditions.  Meningococcal vaccine. You may need this if you have certain  conditions.  Hepatitis A vaccine. You may need this if you have certain conditions or if you travel or work in places where you may be exposed to hepatitis A.  Hepatitis B vaccine. You may need this if you have certain conditions or if you travel or work in places where you may be exposed to hepatitis B.  Haemophilus influenzae type b (Hib) vaccine. You may need this if you have certain conditions.  Talk to your health care provider about which screenings and vaccines you need and how often you need them. This information is not intended to replace advice given to you by your health care provider. Make sure you discuss any questions you have with your health care provider. Document Released: 01/29/2015 Document Revised: 09/22/2015 Document Reviewed: 11/03/2014 Elsevier Interactive Patient Education  2018 [employer]  

## 2017-05-02 ENCOUNTER — Telehealth: Payer: Self-pay

## 2017-05-02 ENCOUNTER — Other Ambulatory Visit: Payer: Self-pay | Admitting: *Deleted

## 2017-05-02 DIAGNOSIS — E785 Hyperlipidemia, unspecified: Secondary | ICD-10-CM

## 2017-05-02 DIAGNOSIS — I10 Essential (primary) hypertension: Secondary | ICD-10-CM

## 2017-05-02 DIAGNOSIS — IMO0002 Reserved for concepts with insufficient information to code with codable children: Secondary | ICD-10-CM

## 2017-05-02 DIAGNOSIS — E1165 Type 2 diabetes mellitus with hyperglycemia: Principal | ICD-10-CM

## 2017-05-02 DIAGNOSIS — E1151 Type 2 diabetes mellitus with diabetic peripheral angiopathy without gangrene: Secondary | ICD-10-CM

## 2017-05-02 MED ORDER — DAPAGLIFLOZIN PROPANEDIOL 5 MG PO TABS: 5.0000 mg | ORAL_TABLET | Freq: Every day | ORAL | 2 refills | Status: DC

## 2017-05-02 NOTE — Telephone Encounter (Signed)
PA initiated via Covermymeds; KEY: [id number]. Awaiting determination.

## 2017-05-02 NOTE — Telephone Encounter (Signed)
PA denied. Wilder Glade is a plan exclusion, no further information provided.

## 2017-05-03 MED ORDER — EMPAGLIFLOZIN 10 MG PO TABS: 10.0000 mg | ORAL_TABLET | Freq: Every day | ORAL | 2 refills | Status: DC

## 2017-05-03 NOTE — Telephone Encounter (Signed)
Does pt have formulary at home?  Can she print dm meds from formulary on line?

## 2017-05-03 NOTE — Telephone Encounter (Signed)
jardiance 10 mg #30  2 refills  1 po qd  Recheck labs in 3 months

## 2017-05-03 NOTE — Telephone Encounter (Signed)
J[facility]iance sent to [facility]. Pt has labs scheduled for July 2019.

## 2017-05-03 NOTE — Telephone Encounter (Signed)
Placed on ledge.  

## 2017-05-15 ENCOUNTER — Other Ambulatory Visit: Payer: Self-pay

## 2017-05-15 MED ORDER — REPAGLINIDE 0.5 MG PO TABS: 0.5000 mg | ORAL_TABLET | Freq: Three times a day (TID) | ORAL | 0 refills | Status: DC

## 2017-05-21 ENCOUNTER — Encounter: Payer: Self-pay | Admitting: Family Medicine

## 2017-05-21 ENCOUNTER — Other Ambulatory Visit: Payer: Self-pay | Admitting: Family Medicine

## 2017-05-21 ENCOUNTER — Ambulatory Visit (INDEPENDENT_AMBULATORY_CARE_PROVIDER_SITE_OTHER): Payer: 59 | Admitting: Family Medicine

## 2017-05-21 VITALS — BP 135/84 | HR 81 | Temp 98.9°F | Resp 16 | Ht 65.5 in | Wt 193.2 lb

## 2017-05-21 DIAGNOSIS — J40 Bronchitis, not specified as acute or chronic: Secondary | ICD-10-CM | POA: Diagnosis not present

## 2017-05-21 DIAGNOSIS — R0981 Nasal congestion: Secondary | ICD-10-CM

## 2017-05-21 DIAGNOSIS — E118 Type 2 diabetes mellitus with unspecified complications: Secondary | ICD-10-CM | POA: Diagnosis not present

## 2017-05-21 MED ORDER — FLUTICASONE PROPIONATE 50 MCG/ACT NA SUSP: 2.0000 | Freq: Every day | NASAL | 6 refills | Status: DC

## 2017-05-21 MED ORDER — LEVOFLOXACIN 500 MG PO TABS: 500.0000 mg | ORAL_TABLET | Freq: Every day | ORAL | 0 refills | Status: DC

## 2017-05-21 MED ORDER — PROMETHAZINE-DM 6.25-15 MG/5ML PO SYRP
5.0000 mL | ORAL_SOLUTION | Freq: Four times a day (QID) | ORAL | 0 refills | Status: DC | PRN
Start: 2017-05-21 — End: 2017-11-19

## 2017-05-21 MED FILL — PROMETHAZINE-DM SYRUP: 6.25-15 | 6 days supply | Qty: 118 | Fill #0

## 2017-05-21 MED FILL — levoFLOXacin 500 MG TABS: 500 | 7 days supply | Qty: 7 | Fill #0

## 2017-05-21 MED FILL — FLUTICASONE PROP 50 MCG SPR: 50 | 30 days supply | Qty: 16 | Fill #0

## 2017-05-21 NOTE — Assessment & Plan Note (Signed)
levaquin Cough meds per orders rto or call prn

## 2017-05-21 NOTE — Assessment & Plan Note (Signed)
Glucose running low Dec prandin to qd with biggest meal  Call if any questions

## 2017-05-21 NOTE — Progress Notes (Signed)
Patient ID: Megan [name], female   DOB: Jun 15, 1955, 62 y.o.   MRN: [id number]     Subjective:  I acted as a Education administrator for Dr. Carollee Herter.  [name], [name]   Patient ID: Megan [name], female    DOB: [birth year], 62 y.o.   MRN: [id number]  Chief Complaint  Patient presents with  . Cough    Cough  Episode onset: never completely went away since last treated in April. The cough is non-productive. Associated symptoms include nasal congestion. Pertinent negatives include no ear congestion, ear pain, fever, headaches, postnasal drip or wheezing. Treatments tried: singulair.    Patient is in today for cough She is no better See last ov  Pt also c/o low blood sugar since starting prandin-- she only took one with breakfast and she became shaky and BS--67.    Patient Care Team: Carollee Herter, Alferd Apa, DO as PCP - General Molli Posey, MD as Consulting Physician (Obstetrics and Gynecology)   Past Medical History:  Diagnosis Date  . Allergy   . Arthritis    fingers  . Colon polyp   . Diabetes mellitus   . Diverticulosis   . GERD (gastroesophageal reflux disease)    occasionally  . H/O hiatal hernia    ?  Marland Kitchen Hemorrhoids   . Hyperlipidemia    takes fish oil  . Hypertension   . Infectious colitis   . Ventral hernia     Past Surgical History:  Procedure Laterality Date  . colonscopy    . DIAGNOSTIC LAPAROSCOPY    . FOOT SURGERY Bilateral    hammer toes  . HEMORRHOID SURGERY    . HERNIA REPAIR  01/20/2011   incisional  . INCISIONAL HERNIA REPAIR  01/20/2011   Procedure: LAPAROSCOPIC INCISIONAL HERNIA;  Surgeon: Judieth Keens, DO;  Location: [facility];  Service: General;  Laterality: N/A;  Laparoscopic repair of a recurrent incisional hernia with Mesh  . INGUINAL HERNIA REPAIR     x's 3  . VENTRAL HERNIA REPAIR      Family History  Problem Relation Age of Onset  . Breast cancer Mother   . Diabetes Mother   . Dementia Mother   . Liver cancer Mother   . Heart  disease Father        heart attack  . Diabetes Brother   . Breast cancer Sister   . Colon cancer Neg Hx   . Stomach cancer Neg Hx     Social History   Socioeconomic History  . Marital status: Divorced    Spouse name: Not on file  . Number of children: 1  . Years of education: Not on file  . Highest education level: Not on file  Occupational History  . Occupation: assessment specialist    Comment: city of [employer]  Social Needs  . Financial resource strain: Not on file  . Food insecurity:    Worry: Not on file    Inability: Not on file  . Transportation needs:    Medical: Not on file    Non-medical: Not on file  Tobacco Use  . Smoking status: Former Smoker    Packs/day: 1.00    Years: 20.00    Pack years: 20.00    Types: Cigarettes    Last attempt to quit: 01/05/1996    Years since quitting: 21.3  . Smokeless tobacco: Never Used  Substance and Sexual Activity  . Alcohol use: Not Currently    Alcohol/week: 2.4 oz    Types: 4  Glasses of wine per week    Frequency: Never  . Drug use: No  . Sexual activity: Not on file  Lifestyle  . Physical activity:    Days per week: Not on file    Minutes per session: Not on file  . Stress: Not on file  Relationships  . Social connections:    Talks on phone: Not on file    Gets together: Not on file    Attends religious service: Not on file    Active member of club or organization: Not on file    Attends meetings of clubs or organizations: Not on file    Relationship status: Not on file  . Intimate partner violence:    Fear of current or ex partner: Not on file    Emotionally abused: Not on file    Physically abused: Not on file    Forced sexual activity: Not on file  Other Topics Concern  . Not on file  Social History Narrative   Exercise 3 times a week    Outpatient Medications Prior to Visit  Medication Sig Dispense Refill  . amLODipine (NORVASC) 5 MG tablet TAKE 1 TABLET BY MOUTH ONCE DAILY 90 tablet 0  .  Cyanocobalamin (VITAMIN B 12 PO) Take 1 tablet by mouth daily.    . Fish Oil-Cholecalciferol (FISH OIL + D3 PO) Take 1 capsule by mouth daily.    Marland Kitchen losartan (COZAAR) 25 MG tablet Take 1 tablet (25 mg total) by mouth daily. 90 tablet 1  . montelukast (SINGULAIR) 10 MG tablet Take 1 tablet (10 mg total) by mouth at bedtime. 30 tablet 3  . Multiple Vitamin (MULTIVITAMIN) tablet Take 1 tablet by mouth daily. OVER 50 VITAMIN    . repaglinide (PRANDIN) 0.5 MG tablet Take 1 tablet (0.5 mg total) by mouth 3 (three) times daily before meals. 90 tablet 0  . sitaGLIPtin-metformin (JANUMET) 50-1000 MG tablet Take 1 tablet by mouth 2 (two) times daily with a meal. 180 tablet 1  . empagliflozin (JARDIANCE) 10 MG TABS tablet Take 10 mg by mouth daily. 30 tablet 2   No facility-administered medications prior to visit.     Allergies  Allergen Reactions  . Aspirin Nausea Only    Review of Systems  Constitutional: Negative for fever.  HENT: Negative for ear pain and postnasal drip.   Respiratory: Positive for cough and sputum production. Negative for wheezing.   Neurological: Negative for headaches.       Objective:    Physical Exam  Constitutional: She is oriented to person, place, and time. She appears well-developed and well-nourished.  HENT:  Right Ear: External ear normal.  Left Ear: External ear normal.  + PND + errythema  Eyes: Conjunctivae are normal. Right eye exhibits no discharge. Left eye exhibits no discharge.  Cardiovascular: Normal rate, regular rhythm and normal heart sounds.  No murmur heard. Pulmonary/Chest: No respiratory distress. She has no wheezes. She has rhonchi in the right middle field and the right lower field. She has no rales. She exhibits no tenderness.  Musculoskeletal: She exhibits no edema.  Lymphadenopathy:    She has no cervical adenopathy.  Neurological: She is alert and oriented to person, place, and time.  Nursing note and vitals reviewed.   BP 135/84  (BP Location: Left Arm, Cuff Size: Normal)   Pulse 81   Temp 98.9 F (37.2 C) (Oral)   Resp 16   Ht 5' 5.5" (1.664 m)   Wt 193 lb 3.2 oz (87.6  kg)   SpO2 99%   BMI 31.66 kg/m  Wt Readings from Last 3 Encounters:  05/21/17 193 lb 3.2 oz (87.6 kg)  04/30/17 190 lb 9.6 oz (86.5 kg)  04/16/17 193 lb 9.6 oz (87.8 kg)   BP Readings from Last 3 Encounters:  05/21/17 135/84  04/30/17 136/80  04/16/17 138/70     Immunization History  Administered Date(s) Administered  . Influenza Whole 11/06/2011  . Pneumococcal Polysaccharide-23 07/01/2013  . Tdap 05/03/2012  . Zoster 07/01/2013    Health Maintenance  Topic Date Due  . HIV Screening  03/19/1968  . OPHTHALMOLOGY EXAM  10/03/2015  . INFLUENZA VACCINE  08/16/2017  . HEMOGLOBIN A1C  10/30/2017  . MAMMOGRAM  02/04/2018  . FOOT EXAM  05/01/2018  . PAP SMEAR  01/31/2020  . TETANUS/TDAP  05/04/2022  . COLONOSCOPY  10/26/2026  . Hepatitis C Screening  Completed    Lab Results  Component Value Date   WBC 8.9 04/30/2017   HGB 11.6 (L) 04/30/2017   HCT 36.7 04/30/2017   PLT 431.0 (H) 04/30/2017   GLUCOSE 114 (H) 04/30/2017   CHOL 148 04/30/2017   TRIG 138.0 04/30/2017   HDL 48.40 04/30/2017   LDLDIRECT 88.0 04/21/2014   LDLCALC 72 04/30/2017   ALT 26 04/30/2017   AST 21 04/30/2017   NA 137 04/30/2017   K 4.0 04/30/2017   CL 103 04/30/2017   CREATININE 0.70 04/30/2017   BUN 8 04/30/2017   CO2 26 04/30/2017   TSH 0.76 04/30/2017   HGBA1C 7.3 (H) 04/30/2017   MICROALBUR 64.8 (H) 11/02/2014    Lab Results  Component Value Date   TSH 0.76 04/30/2017   Lab Results  Component Value Date   WBC 8.9 04/30/2017   HGB 11.6 (L) 04/30/2017   HCT 36.7 04/30/2017   MCV 66.5 Repeated and verified X2. (L) 04/30/2017   PLT 431.0 (H) 04/30/2017   Lab Results  Component Value Date   NA 137 04/30/2017   K 4.0 04/30/2017   CO2 26 04/30/2017   GLUCOSE 114 (H) 04/30/2017   BUN 8 04/30/2017   CREATININE 0.70 04/30/2017    BILITOT 0.7 04/30/2017   ALKPHOS 65 04/30/2017   AST 21 04/30/2017   ALT 26 04/30/2017   PROT 7.7 04/30/2017   ALBUMIN 4.0 04/30/2017   CALCIUM 9.2 04/30/2017   GFR 108.30 04/30/2017   Lab Results  Component Value Date   CHOL 148 04/30/2017   Lab Results  Component Value Date   HDL 48.40 04/30/2017   Lab Results  Component Value Date   LDLCALC 72 04/30/2017   Lab Results  Component Value Date   TRIG 138.0 04/30/2017   Lab Results  Component Value Date   CHOLHDL 3 04/30/2017   Lab Results  Component Value Date   HGBA1C 7.3 (H) 04/30/2017         Assessment & Plan:   Problem List Items Addressed This Visit      Unprioritized   Bronchitis - Primary    levaquin Cough meds per orders rto or call prn       Relevant Medications   levofloxacin (LEVAQUIN) 500 MG tablet   promethazine-dextromethorphan (PROMETHAZINE-DM) 6.25-15 MG/5ML syrup   Diabetes mellitus type II, controlled ([place])    Glucose running low Dec prandin to qd with biggest meal  Call if any questions        Other Visit Diagnoses    Nasal congestion       Relevant Medications  fluticasone (FLONASE) 50 MCG/ACT nasal spray      I have discontinued Claria Dice. Dolce's empagliflozin. I am also having her start on levofloxacin, promethazine-dextromethorphan, and fluticasone. Additionally, I am having her maintain her multivitamin, Fish Oil-Cholecalciferol (FISH OIL + D3 PO), amLODipine, Cyanocobalamin (VITAMIN B 12 PO), losartan, sitaGLIPtin-metformin, montelukast, and repaglinide.  Meds ordered this encounter  Medications  . levofloxacin (LEVAQUIN) 500 MG tablet    Sig: Take 1 tablet (500 mg total) by mouth daily.    Dispense:  7 tablet    Refill:  0  . promethazine-dextromethorphan (PROMETHAZINE-DM) 6.25-15 MG/5ML syrup    Sig: Take 5 mLs by mouth 4 (four) times daily as needed.    Dispense:  118 mL    Refill:  0  . fluticasone (FLONASE) 50 MCG/ACT nasal spray    Sig: Place 2 sprays  into both nostrils daily.    Dispense:  16 g    Refill:  6    CMA served as scribe during this visit. History, Physical and Plan performed by medical provider. Documentation and orders reviewed and attested to.  [name], DO

## 2017-05-21 NOTE — Patient Instructions (Signed)

## 2017-07-24 DIAGNOSIS — R2689 Other abnormalities of gait and mobility: Secondary | ICD-10-CM | POA: Diagnosis not present

## 2017-07-24 DIAGNOSIS — M79671 Pain in right foot: Secondary | ICD-10-CM | POA: Diagnosis not present

## 2017-07-24 DIAGNOSIS — M7661 Achilles tendinitis, right leg: Secondary | ICD-10-CM | POA: Diagnosis not present

## 2017-07-31 ENCOUNTER — Other Ambulatory Visit: Payer: 59

## 2017-08-07 ENCOUNTER — Other Ambulatory Visit (INDEPENDENT_AMBULATORY_CARE_PROVIDER_SITE_OTHER): Payer: 59

## 2017-08-07 DIAGNOSIS — I10 Essential (primary) hypertension: Secondary | ICD-10-CM

## 2017-08-07 DIAGNOSIS — E1165 Type 2 diabetes mellitus with hyperglycemia: Secondary | ICD-10-CM | POA: Diagnosis not present

## 2017-08-07 DIAGNOSIS — E785 Hyperlipidemia, unspecified: Secondary | ICD-10-CM

## 2017-08-07 DIAGNOSIS — IMO0002 Reserved for concepts with insufficient information to code with codable children: Secondary | ICD-10-CM

## 2017-08-07 DIAGNOSIS — E1151 Type 2 diabetes mellitus with diabetic peripheral angiopathy without gangrene: Secondary | ICD-10-CM

## 2017-08-07 LAB — COMPREHENSIVE METABOLIC PANEL
ALT: 33 U/L (ref 0–35)
AST: 23 U/L (ref 0–37)
Albumin: 4.1 g/dL (ref 3.5–5.2)
Alkaline Phosphatase: 65 U/L (ref 39–117)
BUN: 13 mg/dL (ref 6–23)
CHLORIDE: 104 meq/L (ref 96–112)
CO2: 27 meq/L (ref 19–32)
CREATININE: 0.82 mg/dL (ref 0.40–1.20)
Calcium: 9.3 mg/dL (ref 8.4–10.5)
GFR: 90.15 mL/min (ref >60.00)
GLUCOSE: 136 mg/dL — AB (ref 70–99)
Potassium: 4.4 mEq/L (ref 3.5–5.1)
SODIUM: 140 meq/L (ref 135–145)
Total Bilirubin: 1 mg/dL (ref 0.2–1.2)
Total Protein: 7.6 g/dL (ref 6.0–8.3)

## 2017-08-07 LAB — HEMOGLOBIN A1C: Hgb A1c MFr Bld: 6.9 % — ABNORMAL HIGH (ref 4.6–6.5)

## 2017-08-07 LAB — LIPID PANEL
CHOL/HDL RATIO: 3
Cholesterol: 149 mg/dL (ref 0–200)
HDL: 44.9 mg/dL (ref >39.00)
LDL Cholesterol: 74 mg/dL (ref 0–99)
NONHDL: 104.1
Triglycerides: 152 mg/dL — ABNORMAL HIGH (ref 0.0–149.0)
VLDL: 30.4 mg/dL (ref 0.0–40.0)

## 2017-08-13 ENCOUNTER — Telehealth: Payer: Self-pay

## 2017-08-13 DIAGNOSIS — IMO0002 Reserved for concepts with insufficient information to code with codable children: Secondary | ICD-10-CM

## 2017-08-13 DIAGNOSIS — E1165 Type 2 diabetes mellitus with hyperglycemia: Secondary | ICD-10-CM

## 2017-08-13 DIAGNOSIS — E785 Hyperlipidemia, unspecified: Secondary | ICD-10-CM

## 2017-08-13 DIAGNOSIS — E1151 Type 2 diabetes mellitus with diabetic peripheral angiopathy without gangrene: Secondary | ICD-10-CM

## 2017-08-13 NOTE — Telephone Encounter (Signed)
Lab results reviewed with pt.

## 2017-08-14 DIAGNOSIS — M79671 Pain in right foot: Secondary | ICD-10-CM | POA: Diagnosis not present

## 2017-08-14 DIAGNOSIS — M7661 Achilles tendinitis, right leg: Secondary | ICD-10-CM | POA: Diagnosis not present

## 2017-08-14 DIAGNOSIS — E139 Other specified diabetes mellitus without complications: Secondary | ICD-10-CM | POA: Diagnosis not present

## 2017-08-29 DIAGNOSIS — R809 Proteinuria, unspecified: Secondary | ICD-10-CM | POA: Diagnosis not present

## 2017-08-29 DIAGNOSIS — N181 Chronic kidney disease, stage 1: Secondary | ICD-10-CM | POA: Diagnosis not present

## 2017-08-29 DIAGNOSIS — I10 Essential (primary) hypertension: Secondary | ICD-10-CM | POA: Diagnosis not present

## 2017-09-13 DIAGNOSIS — E139 Other specified diabetes mellitus without complications: Secondary | ICD-10-CM | POA: Diagnosis not present

## 2017-09-13 DIAGNOSIS — M7661 Achilles tendinitis, right leg: Secondary | ICD-10-CM | POA: Diagnosis not present

## 2017-09-21 DIAGNOSIS — M62830 Muscle spasm of back: Secondary | ICD-10-CM | POA: Diagnosis not present

## 2017-09-21 DIAGNOSIS — M546 Pain in thoracic spine: Secondary | ICD-10-CM | POA: Diagnosis not present

## 2017-09-21 DIAGNOSIS — M7912 Myalgia of auxiliary muscles, head and neck: Secondary | ICD-10-CM | POA: Diagnosis not present

## 2017-09-24 DIAGNOSIS — M546 Pain in thoracic spine: Secondary | ICD-10-CM | POA: Diagnosis not present

## 2017-09-24 DIAGNOSIS — M7912 Myalgia of auxiliary muscles, head and neck: Secondary | ICD-10-CM | POA: Diagnosis not present

## 2017-09-24 DIAGNOSIS — M62830 Muscle spasm of back: Secondary | ICD-10-CM | POA: Diagnosis not present

## 2017-11-13 ENCOUNTER — Other Ambulatory Visit: Payer: 59

## 2017-11-17 ENCOUNTER — Other Ambulatory Visit: Payer: Self-pay | Admitting: Family Medicine

## 2017-11-19 ENCOUNTER — Encounter: Payer: Self-pay | Admitting: Family Medicine

## 2017-11-19 ENCOUNTER — Ambulatory Visit (INDEPENDENT_AMBULATORY_CARE_PROVIDER_SITE_OTHER): Payer: 59 | Admitting: Family Medicine

## 2017-11-19 VITALS — BP 140/72 | HR 94 | Temp 98.3°F | Resp 18 | Ht 65.5 in | Wt 191.0 lb

## 2017-11-19 DIAGNOSIS — R0981 Nasal congestion: Secondary | ICD-10-CM

## 2017-11-19 DIAGNOSIS — R05 Cough: Secondary | ICD-10-CM

## 2017-11-19 DIAGNOSIS — R059 Cough, unspecified: Secondary | ICD-10-CM

## 2017-11-19 MED ORDER — FLUTICASONE PROPIONATE 50 MCG/ACT NA SUSP: 2.0000 | Freq: Every day | NASAL | 6 refills | Status: DC

## 2017-11-19 MED ORDER — BENZONATATE 100 MG PO CAPS: 100.0000 mg | ORAL_CAPSULE | Freq: Three times a day (TID) | ORAL | 0 refills | Status: DC | PRN

## 2017-11-19 MED ORDER — PROMETHAZINE-DM 6.25-15 MG/5ML PO SYRP: 5.0000 mL | ORAL_SOLUTION | Freq: Four times a day (QID) | ORAL | 0 refills | Status: DC | PRN

## 2017-11-19 MED ORDER — DOXYCYCLINE HYCLATE 100 MG PO CAPS: 100.0000 mg | ORAL_CAPSULE | Freq: Two times a day (BID) | ORAL | 0 refills | Status: DC

## 2017-11-19 NOTE — Progress Notes (Signed)
[place] [facility] [address], [place], [place] [address] [phone]  Date:  11/19/2017   Name:  Megan [name]   DOB:  1955/02/02   MRN:  [id number]  PCP:  [name], DO    Chief Complaint: Cough (congestion, cough productive of mucous, trouble sleeping, no fever, aches or chills,did not get flu shot)   History of Present Illness:  Megan [name] is a 62 y.o. very pleasant female patient who presents with the following:  Pt of Dr. Etter Sjogren with history of obesity, well controlled DM, HTN, allergic rhinitis I have not seen this pt in the past  Today she notes illness with sx of cough, the cough keeping her awake.  Her sx got worse while at a foodball game in [facility] over the weekend- she thinks maybe due to weather change.   Her sx started late last week She has not noted any fever She does not feel bad overall- no body aches or chills No diarrhea No sick contacts  No ST or earache No vomiting or rash   She is concerned about bacterial bronchitis and requests and antibiotic to make her cough go away.  Explained that abx unfortunately do not treat cough, only bacterial infection    Lab Results  Component Value Date   HGBA1C 6.9 (H) 08/07/2017     Patient Active Problem List   Diagnosis Date Noted  . Bronchitis 05/21/2017  . CAP (community acquired pneumonia) 10/15/2013  . Wheezing 09/29/2013  . Obesity (BMI 30-39.9) 07/01/2013  . Pain, knee 10/02/2012  . Gastrocnemius muscle strain 10/02/2012  . Wrist sprain 10/02/2012  . Thyroid nodule 05/03/2012  . Rash and nonspecific skin eruption 03/14/2012  . HAIR LOSS 03/28/2010  . COLONIC POLYPS, HX OF 11/09/2009  . HEMANGIOMA, HEPATIC 11/04/2009  . UNSPECIFIED ANEMIA 11/04/2009  . DIVERTICULOSIS, COLON 11/04/2009  . FATTY LIVER DISEASE 11/04/2009  . PALPITATIONS 09/06/2009  . Chronic fatigue 08/07/2008  . ALLERGIC RHINITIS 04/10/2007  . Diabetes  mellitus type II, controlled ([place]) 12/21/2006  . Hyperlipidemia 04/12/2006  . Essential hypertension 04/12/2006    Past Medical History:  Diagnosis Date  . Allergy   . Arthritis    fingers  . Colon polyp   . Diabetes mellitus   . Diverticulosis   . GERD (gastroesophageal reflux disease)    occasionally  . H/O hiatal hernia    ?  Marland Kitchen Hemorrhoids   . Hyperlipidemia    takes fish oil  . Hypertension   . Infectious colitis   . Ventral hernia     Past Surgical History:  Procedure Laterality Date  . colonscopy    . DIAGNOSTIC LAPAROSCOPY    . FOOT SURGERY Bilateral    hammer toes  . HEMORRHOID SURGERY    . HERNIA REPAIR  01/20/2011   incisional  . INCISIONAL HERNIA REPAIR  01/20/2011   Procedure: LAPAROSCOPIC INCISIONAL HERNIA;  Surgeon: Judieth Keens, DO;  Location: [facility];  Service: General;  Laterality: N/A;  Laparoscopic repair of a recurrent incisional hernia with Mesh  . INGUINAL HERNIA REPAIR     x's 3  . VENTRAL HERNIA REPAIR      Social History   Tobacco Use  . Smoking status: Former Smoker    Packs/day: 1.00    Years: 20.00    Pack years: 20.00    Types: Cigarettes    Last attempt to quit: 01/05/1996    Years  since quitting: 21.8  . Smokeless tobacco: Never Used  Substance Use Topics  . Alcohol use: Not Currently    Alcohol/week: 4.0 standard drinks    Types: 4 Glasses of wine per week    Frequency: Never  . Drug use: No    Family History  Problem Relation Age of Onset  . Breast cancer Mother   . Diabetes Mother   . Dementia Mother   . Liver cancer Mother   . Heart disease Father        heart attack  . Diabetes Brother   . Breast cancer Sister   . Colon cancer Neg Hx   . Stomach cancer Neg Hx     Allergies  Allergen Reactions  . Aspirin Nausea Only    Medication list has been reviewed and updated.  Current Outpatient Medications on File Prior to Visit  Medication Sig Dispense Refill  . amLODipine (NORVASC) 5 MG tablet TAKE 1  TABLET BY MOUTH EVERY DAY 90 tablet 1  . Cyanocobalamin (VITAMIN B 12 PO) Take 1 tablet by mouth daily.    . Fish Oil-Cholecalciferol (FISH OIL + D3 PO) Take 1 capsule by mouth daily.    . fluticasone (FLONASE) 50 MCG/ACT nasal spray Place 2 sprays into both nostrils daily. 16 g 6  . levofloxacin (LEVAQUIN) 500 MG tablet Take 1 tablet (500 mg total) by mouth daily. 7 tablet 0  . losartan (COZAAR) 25 MG tablet Take 1 tablet (25 mg total) by mouth daily. 90 tablet 1  . montelukast (SINGULAIR) 10 MG tablet Take 1 tablet (10 mg total) by mouth at bedtime. 30 tablet 3  . Multiple Vitamin (MULTIVITAMIN) tablet Take 1 tablet by mouth daily. OVER 50 VITAMIN    . repaglinide (PRANDIN) 0.5 MG tablet Take 1 tablet (0.5 mg total) by mouth 3 (three) times daily before meals. 90 tablet 0  . sitaGLIPtin-metformin (JANUMET) 50-1000 MG tablet Take 1 tablet by mouth 2 (two) times daily with a meal. 180 tablet 1   No current facility-administered medications on file prior to visit.     Review of Systems:  As per HPI- otherwise negative. No wheezing noted at home    Physical Examination: Vitals:   11/19/17 0849  BP: 140/72  Pulse: 94  Resp: 18  Temp: 98.3 F (36.8 C)  SpO2: 98%   Vitals:   11/19/17 0849  Weight: 191 lb (86.6 kg)  Height: 5' 5.5" (1.664 m)   Body mass index is 31.3 kg/m. Ideal Body Weight: Weight in (lb) to have BMI = 25: 152.2  GEN: WDWN, NAD, Non-toxic, A & O x 3, obese, otherwise looks well  HEENT: Atraumatic, Normocephalic. Neck supple. No masses, No LAD.  Bilateral TM wnl, oropharynx normal.  PEERL,EOMI.   Ears and Nose: No external deformity. CV: RRR, No M/G/R. No JVD. No thrill. No extra heart sounds. PULM: CTA B, no wheezes, crackles, rhonchi. No retractions. No resp. distress. No accessory muscle use. ABD: S, NT, ND EXTR: No c/c/e NEURO Normal gait.  PSYCH: Normally interactive. Conversant. Not depressed or anxious appearing.  Calm demeanor.    Assessment and  Plan: Cough - Plan: promethazine-dextromethorphan (PROMETHAZINE-DM) 6.25-15 MG/5ML syrup, benzonatate (TESSALON) 100 MG capsule, doxycycline (VIBRAMYCIN) 100 MG capsule  Nasal congestion - Plan: fluticasone (FLONASE) 50 MCG/ACT nasal spray  Here today with cough for 3-4 days otherwise feeling well rx for tessalon and phenergan DM- cautioned regarding sedation Refilled her flonase Gave rx for doxycycline to hold and use if not feeling  better in the next few days- if sx do not resolve she may develop bronchitis and could use abx. Explained how to use this med   Signed Lamar Blinks, MD

## 2017-11-19 NOTE — Patient Instructions (Signed)
It was good to see you today- I hope that you feel better soon!   I refilled your flonase for you to use  Also, tessalon perles (non drowsy) and your cough syrup- this will make you drowsy so use caution  I wrote an rx for doxycycline for you to hold and use if not better in the next few days.  However, at this point I suspect you have a viral infection which will likely not respond to antibiotics.    Let me know if you have any concerns

## 2017-11-30 ENCOUNTER — Other Ambulatory Visit (INDEPENDENT_AMBULATORY_CARE_PROVIDER_SITE_OTHER): Payer: 59

## 2017-11-30 DIAGNOSIS — E785 Hyperlipidemia, unspecified: Secondary | ICD-10-CM | POA: Diagnosis not present

## 2017-11-30 DIAGNOSIS — E1165 Type 2 diabetes mellitus with hyperglycemia: Secondary | ICD-10-CM | POA: Diagnosis not present

## 2017-11-30 DIAGNOSIS — E1151 Type 2 diabetes mellitus with diabetic peripheral angiopathy without gangrene: Secondary | ICD-10-CM

## 2017-11-30 DIAGNOSIS — IMO0002 Reserved for concepts with insufficient information to code with codable children: Secondary | ICD-10-CM

## 2017-11-30 LAB — COMPREHENSIVE METABOLIC PANEL
ALT: 35 U/L (ref 0–35)
AST: 27 U/L (ref 0–37)
Albumin: 4 g/dL (ref 3.5–5.2)
Alkaline Phosphatase: 70 U/L (ref 39–117)
BILIRUBIN TOTAL: 0.5 mg/dL (ref 0.2–1.2)
BUN: 11 mg/dL (ref 6–23)
CHLORIDE: 106 meq/L (ref 96–112)
CO2: 26 meq/L (ref 19–32)
CREATININE: 0.73 mg/dL (ref 0.40–1.20)
Calcium: 9.1 mg/dL (ref 8.4–10.5)
GFR: 102.99 mL/min (ref >60.00)
GLUCOSE: 131 mg/dL — AB (ref 70–99)
Potassium: 4.1 mEq/L (ref 3.5–5.1)
Sodium: 140 mEq/L (ref 135–145)
Total Protein: 7.1 g/dL (ref 6.0–8.3)

## 2017-11-30 LAB — LIPID PANEL
CHOL/HDL RATIO: 3
Cholesterol: 152 mg/dL (ref 0–200)
HDL: 45.5 mg/dL (ref >39.00)
LDL CALC: 82 mg/dL (ref 0–99)
NONHDL: 106.6
Triglycerides: 125 mg/dL (ref 0.0–149.0)
VLDL: 25 mg/dL (ref 0.0–40.0)

## 2017-11-30 LAB — HEMOGLOBIN A1C: HEMOGLOBIN A1C: 7.1 % — AB (ref 4.6–6.5)

## 2017-12-05 ENCOUNTER — Other Ambulatory Visit: Payer: Self-pay | Admitting: *Deleted

## 2017-12-05 DIAGNOSIS — IMO0002 Reserved for concepts with insufficient information to code with codable children: Secondary | ICD-10-CM

## 2017-12-05 DIAGNOSIS — E1165 Type 2 diabetes mellitus with hyperglycemia: Secondary | ICD-10-CM

## 2017-12-05 DIAGNOSIS — E785 Hyperlipidemia, unspecified: Secondary | ICD-10-CM

## 2017-12-05 DIAGNOSIS — E1151 Type 2 diabetes mellitus with diabetic peripheral angiopathy without gangrene: Secondary | ICD-10-CM

## 2017-12-05 DIAGNOSIS — I10 Essential (primary) hypertension: Secondary | ICD-10-CM

## 2017-12-11 ENCOUNTER — Other Ambulatory Visit: Payer: Self-pay | Admitting: Family Medicine

## 2017-12-11 DIAGNOSIS — E1165 Type 2 diabetes mellitus with hyperglycemia: Principal | ICD-10-CM

## 2017-12-11 DIAGNOSIS — IMO0002 Reserved for concepts with insufficient information to code with codable children: Secondary | ICD-10-CM

## 2017-12-11 DIAGNOSIS — E1151 Type 2 diabetes mellitus with diabetic peripheral angiopathy without gangrene: Secondary | ICD-10-CM

## 2018-03-07 ENCOUNTER — Other Ambulatory Visit (INDEPENDENT_AMBULATORY_CARE_PROVIDER_SITE_OTHER): Payer: 59

## 2018-03-07 DIAGNOSIS — E1151 Type 2 diabetes mellitus with diabetic peripheral angiopathy without gangrene: Secondary | ICD-10-CM | POA: Diagnosis not present

## 2018-03-07 DIAGNOSIS — E785 Hyperlipidemia, unspecified: Secondary | ICD-10-CM

## 2018-03-07 DIAGNOSIS — I10 Essential (primary) hypertension: Secondary | ICD-10-CM

## 2018-03-07 DIAGNOSIS — E1165 Type 2 diabetes mellitus with hyperglycemia: Secondary | ICD-10-CM

## 2018-03-07 DIAGNOSIS — IMO0002 Reserved for concepts with insufficient information to code with codable children: Secondary | ICD-10-CM

## 2018-03-07 LAB — COMPREHENSIVE METABOLIC PANEL
ALK PHOS: 70 U/L (ref 39–117)
ALT: 29 U/L (ref 0–35)
AST: 22 U/L (ref 0–37)
Albumin: 4.1 g/dL (ref 3.5–5.2)
BUN: 13 mg/dL (ref 6–23)
CO2: 28 mEq/L (ref 19–32)
Calcium: 9.4 mg/dL (ref 8.4–10.5)
Chloride: 107 mEq/L (ref 96–112)
Creatinine, Ser: 0.78 mg/dL (ref 0.40–1.20)
GFR: 89.7 mL/min (ref >60.00)
Glucose, Bld: 111 mg/dL — ABNORMAL HIGH (ref 70–99)
Potassium: 4.8 mEq/L (ref 3.5–5.1)
Sodium: 143 mEq/L (ref 135–145)
TOTAL PROTEIN: 7.2 g/dL (ref 6.0–8.3)
Total Bilirubin: 0.6 mg/dL (ref 0.2–1.2)

## 2018-03-07 LAB — LIPID PANEL
Cholesterol: 138 mg/dL (ref 0–200)
HDL: 47.7 mg/dL (ref >39.00)
LDL Cholesterol: 68 mg/dL (ref 0–99)
NonHDL: 90.28
Total CHOL/HDL Ratio: 3
Triglycerides: 111 mg/dL (ref 0.0–149.0)
VLDL: 22.2 mg/dL (ref 0.0–40.0)

## 2018-03-07 LAB — HEMOGLOBIN A1C: Hgb A1c MFr Bld: 6.9 % — ABNORMAL HIGH (ref 4.6–6.5)

## 2018-03-12 ENCOUNTER — Encounter: Payer: Self-pay | Admitting: *Deleted

## 2018-04-05 ENCOUNTER — Ambulatory Visit: Payer: Self-pay

## 2018-04-05 NOTE — Telephone Encounter (Signed)
Left VM to return call to office 

## 2018-04-05 NOTE — Telephone Encounter (Addendum)
Pt called and said she has just traveled to [place] to visit family   She was in [place] 3/11- 3/18.  She has developed cold symptoms since returning Stuffy nose and cough. She has no known contact with COVID-19. Family she visited does have cold symptoms.  She denies SOB chest pain and fever. She is diabetic.  Per protocol call placed to office [place].  Per Santiago Glad note will be route to office. Pt has been informed to quarantine until advised by Dr Carollee Herter. Pt will monitor her temperature twice per day. She will call back with worsening symptoms.  Stay at home advice read to patient per CDC guidelines.  Reason for Disposition . [1] Travel from or living in high risk area (identified by [facility]) AND [2] within last 14 days AND [3] NO cough or fever or breathing difficulty  Answer Assessment - Initial Assessment Questions 1. CONFIRMED CASE: "Who is the person with the confirmed COVID-19 infection that you were exposed to?"     No known contact 2. PLACE of CONTACT: "Where were you when you were exposed to COVID-19  (coronavirus disease 2019)?" (e.g., city, state, country)     [place] 3. TYPE of CONTACT: "How much contact was there?" (e.g., live in same house, work in same office, same school)     household 4. DATE of CONTACT: "When did you have contact with a coronavirus patient?" (e.g., days)    Unsure traveled from 3/11- 3/18 5. DURATION of CONTACT: "How long were you in contact with the COVID-19 (coronavirus disease) patient?" (e.g., a few seconds, passed by person, a few minutes, live with the patient)    Unsure 6. SYMPTOMS: "Do you have any symptoms?" (e.g., fever, cough, breathing difficulty)    Cold, cough 7. PREGNANCY OR POSTPARTUM: "Is there any chance you are pregnant?" "When was your last menstrual period?" "Did you deliver in the last 2 weeks?"     No 8. HIGH RISK: "Do you have any heart or lung problems? Do you have a weakened immune system?" (e.g., CHF, COPD, asthma, HIV positive,  chemotherapy, renal failure, diabetes mellitus, sickle cell anemia)     diabetes  Protocols used: CORONAVIRUS (COVID-19) EXPOSURE-A-AH

## 2018-04-07 NOTE — Telephone Encounter (Signed)
Agree with advice given If she gets worse we can see her or consider web ex visit

## 2018-04-09 ENCOUNTER — Telehealth: Payer: Self-pay | Admitting: *Deleted

## 2018-04-09 ENCOUNTER — Other Ambulatory Visit: Payer: Self-pay

## 2018-04-09 ENCOUNTER — Ambulatory Visit (INDEPENDENT_AMBULATORY_CARE_PROVIDER_SITE_OTHER): Payer: 59 | Admitting: Family Medicine

## 2018-04-09 ENCOUNTER — Encounter: Payer: Self-pay | Admitting: Family Medicine

## 2018-04-09 DIAGNOSIS — J0140 Acute pansinusitis, unspecified: Secondary | ICD-10-CM

## 2018-04-09 MED ORDER — DOXYCYCLINE HYCLATE 100 MG PO TABS: 100.0000 mg | ORAL_TABLET | Freq: Two times a day (BID) | ORAL | 0 refills | Status: DC

## 2018-04-09 MED ORDER — BENZONATATE 100 MG PO CAPS: 200.0000 mg | ORAL_CAPSULE | Freq: Three times a day (TID) | ORAL | 0 refills | Status: DC | PRN

## 2018-04-09 NOTE — Patient Instructions (Signed)
Sinusitis, Adult  Sinusitis is inflammation of your sinuses. Sinuses are hollow spaces in the bones around your face. Your sinuses are located:   Around your eyes.   In the middle of your forehead.   Behind your nose.   In your cheekbones.  Mucus normally drains out of your sinuses. When your nasal tissues become inflamed or swollen, mucus can become trapped or blocked. This allows bacteria, viruses, and fungi to grow, which leads to infection. Most infections of the sinuses are caused by a virus.  Sinusitis can develop quickly. It can last for up to 4 weeks (acute) or for more than 12 weeks (chronic). Sinusitis often develops after a cold.  What are the causes?  This condition is caused by anything that creates swelling in the sinuses or stops mucus from draining. This includes:   Allergies.   Asthma.   Infection from bacteria or viruses.   Deformities or blockages in your nose or sinuses.   Abnormal growths in the nose (nasal polyps).   Pollutants, such as chemicals or irritants in the air.   Infection from fungi (rare).  What increases the risk?  You are more likely to develop this condition if you:   Have a weak body defense system (immune system).   Do a lot of swimming or diving.   Overuse nasal sprays.   Smoke.  What are the signs or symptoms?  The main symptoms of this condition are pain and a feeling of pressure around the affected sinuses. Other symptoms include:   Stuffy nose or congestion.   Thick drainage from your nose.   Swelling and warmth over the affected sinuses.   Headache.   Upper toothache.   A cough that may get worse at night.   Extra mucus that collects in the throat or the back of the nose (postnasal drip).   Decreased sense of smell and taste.   Fatigue.   A fever.   Sore throat.   Bad breath.  How is this diagnosed?  This condition is diagnosed based on:   Your symptoms.   Your medical history.   A physical exam.   Tests to find out if your condition is  acute or chronic. This may include:  ? Checking your nose for nasal polyps.  ? Viewing your sinuses using a device that has a light (endoscope).  ? Testing for allergies or bacteria.  ? Imaging tests, such as an MRI or CT scan.  In rare cases, a bone biopsy may be done to rule out more serious types of fungal sinus disease.  How is this treated?  Treatment for sinusitis depends on the cause and whether your condition is chronic or acute.   If caused by a virus, your symptoms should go away on their own within 10 days. You may be given medicines to relieve symptoms. They include:  ? Medicines that shrink swollen nasal passages (topical intranasal decongestants).  ? Medicines that treat allergies (antihistamines).  ? A spray that eases inflammation of the nostrils (topical intranasal corticosteroids).  ? Rinses that help get rid of thick mucus in your nose (nasal saline washes).   If caused by bacteria, your health care provider may recommend waiting to see if your symptoms improve. Most bacterial infections will get better without antibiotic medicine. You may be given antibiotics if you have:  ? A severe infection.  ? A weak immune system.   If caused by narrow nasal passages or nasal polyps, you may need   to have surgery.  Follow these instructions at home:  Medicines   Take, use, or apply over-the-counter and prescription medicines only as told by your health care provider. These may include nasal sprays.   If you were prescribed an antibiotic medicine, take it as told by your health care provider. Do not stop taking the antibiotic even if you start to feel better.  Hydrate and humidify     Drink enough fluid to keep your urine pale yellow. Staying hydrated will help to thin your mucus.   Use a cool mist humidifier to keep the humidity level in your home above 50%.   Inhale steam for 10-15 minutes, 3-4 times a day, or as told by your health care provider. You can do this in the bathroom while a hot shower is  running.   Limit your exposure to cool or dry air.  Rest   Rest as much as possible.   Sleep with your head raised (elevated).   Make sure you get enough sleep each night.  General instructions     Apply a warm, moist washcloth to your face 3-4 times a day or as told by your health care provider. This will help with discomfort.   Wash your hands often with soap and water to reduce your exposure to germs. If soap and water are not available, use hand sanitizer.   Do not smoke. Avoid being around people who are smoking (secondhand smoke).   Keep all follow-up visits as told by your health care provider. This is important.  Contact a health care provider if:   You have a fever.   Your symptoms get worse.   Your symptoms do not improve within 10 days.  Get help right away if:   You have a severe headache.   You have persistent vomiting.   You have severe pain or swelling around your face or eyes.   You have vision problems.   You develop confusion.   Your neck is stiff.   You have trouble breathing.  Summary   Sinusitis is soreness and inflammation of your sinuses. Sinuses are hollow spaces in the bones around your face.   This condition is caused by nasal tissues that become inflamed or swollen. The swelling traps or blocks the flow of mucus. This allows bacteria, viruses, and fungi to grow, which leads to infection.   If you were prescribed an antibiotic medicine, take it as told by your health care provider. Do not stop taking the antibiotic even if you start to feel better.   Keep all follow-up visits as told by your health care provider. This is important.  This information is not intended to replace advice given to you by your health care provider. Make sure you discuss any questions you have with your health care provider.  Document Released: 01/02/2005 Document Revised: 06/04/2017 Document Reviewed: 06/04/2017  Elsevier Interactive Patient Education  2019 [employer]

## 2018-04-09 NOTE — Telephone Encounter (Signed)
Spoke with patient about her visit.  She stated that she traveled to [place] from 03/27/18-04/03/18.  She has a cough, sweats, and post nasal drip.  She spoke with the nurse and has been taking claritin and robitussin.  She usually gets this about twice a year.

## 2018-04-09 NOTE — Progress Notes (Signed)
Virtual Visit via Telephone Note  I connected with Megan [name] on 04/09/18 at  9:15 AM EDT by telephone and verified that I am speaking with the correct person using two identifiers.   I discussed the limitations, risks, security and privacy concerns of performing an evaluation and management service by telephone and the availability of in person appointments. I also discussed with the patient that there may be a patient responsible charge related to this service. The patient expressed understanding and agreed to proceed.   History of Present Illness: 2 week hx congestion , sinus pressure---pt was in [place] for 2 weeks   Pt using otc  + cough, production    + robitussin and dayquil with no relief.  No fever, no body aches    + chills      Observations/Objective: No fever  Glucose is good  No headache    Assessment and Plan: Sinusitis -- doxy and steroid nasal spray and tessalon perles  Follow Up Instructions:    I discussed the assessment and treatment plan with the patient. The patient was provided an opportunity to ask questions and all were answered. The patient agreed with the plan and demonstrated an understanding of the instructions.   The patient was advised to call back or seek an in-person evaluation if the symptoms worsen or if the condition fails to improve as anticipated.  I provided 15 minutes of non-face-to-face time during this encounter.   [name], DO

## 2018-04-09 NOTE — Telephone Encounter (Signed)
Pt did phone visit today

## 2018-04-11 NOTE — Telephone Encounter (Signed)
Follow up call made to patient states she spoke with Dr. Carollee Herter and was given RX's.

## 2018-04-17 ENCOUNTER — Other Ambulatory Visit: Payer: Self-pay | Admitting: Family Medicine

## 2018-04-17 ENCOUNTER — Telehealth: Payer: Self-pay | Admitting: Family Medicine

## 2018-04-17 DIAGNOSIS — J324 Chronic pansinusitis: Secondary | ICD-10-CM

## 2018-04-17 MED ORDER — CEFDINIR 300 MG PO CAPS: 300.0000 mg | ORAL_CAPSULE | Freq: Two times a day (BID) | ORAL | 0 refills | Status: DC

## 2018-04-17 MED ORDER — PROMETHAZINE-DM 6.25-15 MG/5ML PO SYRP: 5.0000 mL | ORAL_SOLUTION | Freq: Four times a day (QID) | ORAL | 0 refills | Status: DC | PRN

## 2018-04-17 NOTE — Telephone Encounter (Signed)
Sent omnicef and phenergan dm to pharmacy If no better with this -- will need web ex

## 2018-04-17 NOTE — Telephone Encounter (Signed)
Copied from [place] [id number]. Topic: Quick Communication - See Telephone Encounter >> Apr 17, 2018 12:52 PM Vernona Rieger wrote:  CRM for notification. See Telephone encounter for: 04/17/18  Patient was given a cough syrup and doxycycline (VIBRA-TABS) 100 MG tablet on 3/24 and just feels no better. She has finished the syrup and has 1 day left of the antibiotic. She is still coughing and a little congestion. She has been checking her temperature everyday and she has had no fever. She would like a nurse to call her

## 2018-04-17 NOTE — Telephone Encounter (Signed)
Please advise 

## 2018-04-18 NOTE — Telephone Encounter (Signed)
Left detailed message on machine about note.

## 2018-04-21 ENCOUNTER — Other Ambulatory Visit: Payer: Self-pay

## 2018-04-21 ENCOUNTER — Ambulatory Visit (HOSPITAL_COMMUNITY)
Admission: EM | Admit: 2018-04-21 | Discharge: 2018-04-21 | Disposition: A | Discharge status: Home / Self Care | Payer: 59 | Attending: Family Medicine | Admitting: Family Medicine

## 2018-04-21 ENCOUNTER — Encounter (HOSPITAL_COMMUNITY): Payer: Self-pay | Admitting: Emergency Medicine

## 2018-04-21 DIAGNOSIS — J069 Acute upper respiratory infection, unspecified: Secondary | ICD-10-CM | POA: Diagnosis not present

## 2018-04-21 DIAGNOSIS — B9789 Other viral agents as the cause of diseases classified elsewhere: Secondary | ICD-10-CM | POA: Diagnosis not present

## 2018-04-21 NOTE — ED Provider Notes (Addendum)
[facility]    CSN: [id number] Arrival date & time: 04/21/18  1139     History   Chief Complaint Chief Complaint  Patient presents with  . Cough    HPI Megan [name] is a 63 y.o. female history of hypertension, hyperlipidemia, DM type II presenting today for evaluation of shortness of breath.  Patient states that over the past few weeks she has had cough, congestion and rhinorrhea.  She has been seen by her PCP multiple times and treated for sinusitis.  Initially she was prescribed doxycycline and Tessalon, this did not help her symptoms and was switched to [place] 3 days ago.  Yesterday she was feeling largely improved and had more energy.  Today when she woke up she felt slightly more fatigued and felt as if she was more short of breath and "talking more with breathing".  Denies chest pain.  Her cough has improved and has been relatively mild.  Denies any fevers.  Denies any exposure to any one who has tested positive for COVID-19.  Did recently travel to [place] from 3/11 until 3/18.  Has a very remote smoking history, quit 24 years ago.  HPI  Past Medical History:  Diagnosis Date  . Allergy   . Arthritis    fingers  . Colon polyp   . Diabetes mellitus   . Diverticulosis   . GERD (gastroesophageal reflux disease)    occasionally  . H/O hiatal hernia    ?  Marland Kitchen Hemorrhoids   . Hyperlipidemia    takes fish oil  . Hypertension   . Infectious colitis   . Ventral hernia     Patient Active Problem List   Diagnosis Date Noted  . Bronchitis 05/21/2017  . CAP (community acquired pneumonia) 10/15/2013  . Wheezing 09/29/2013  . Obesity (BMI 30-39.9) 07/01/2013  . Pain, knee 10/02/2012  . Gastrocnemius muscle strain 10/02/2012  . Wrist sprain 10/02/2012  . Thyroid nodule 05/03/2012  . Rash and nonspecific skin eruption 03/14/2012  . HAIR LOSS 03/28/2010  . COLONIC POLYPS, HX OF 11/09/2009  . HEMANGIOMA, HEPATIC 11/04/2009  . UNSPECIFIED ANEMIA 11/04/2009   . DIVERTICULOSIS, COLON 11/04/2009  . FATTY LIVER DISEASE 11/04/2009  . PALPITATIONS 09/06/2009  . Chronic fatigue 08/07/2008  . ALLERGIC RHINITIS 04/10/2007  . Diabetes mellitus type II, controlled ([place]) 12/21/2006  . Hyperlipidemia 04/12/2006  . Essential hypertension 04/12/2006    Past Surgical History:  Procedure Laterality Date  . colonscopy    . DIAGNOSTIC LAPAROSCOPY    . FOOT SURGERY Bilateral    hammer toes  . HEMORRHOID SURGERY    . HERNIA REPAIR  01/20/2011   incisional  . INCISIONAL HERNIA REPAIR  01/20/2011   Procedure: LAPAROSCOPIC INCISIONAL HERNIA;  Surgeon: Judieth Keens, DO;  Location: [facility];  Service: General;  Laterality: N/A;  Laparoscopic repair of a recurrent incisional hernia with Mesh  . INGUINAL HERNIA REPAIR     x's 3  . VENTRAL HERNIA REPAIR      OB History   No obstetric history on file.      Home Medications    Prior to Admission medications   Medication Sig Start Date End Date Taking? Authorizing Provider  amLODipine (NORVASC) 5 MG tablet TAKE 1 TABLET BY MOUTH EVERY DAY 11/20/17   Carollee Herter, Alferd Apa, DO  benzonatate (TESSALON PERLES) 100 MG capsule Take 2 capsules (200 mg total) by mouth 3 (three) times daily as needed for cough. 04/09/18   [name],  DO  benzonatate (TESSALON) 100 MG capsule Take 1 capsule (100 mg total) by mouth 3 (three) times daily as needed for cough. 11/19/17   Copland, Gay Filler, MD  cefdinir (OMNICEF) 300 MG capsule Take 1 capsule (300 mg total) by mouth 2 (two) times daily. 04/17/18   [name], DO  Cyanocobalamin (VITAMIN B 12 PO) Take 1 tablet by mouth daily.    [provider]  doxycycline (VIBRA-TABS) 100 MG tablet Take 1 tablet (100 mg total) by mouth 2 (two) times daily. 04/09/18   [name], DO  doxycycline (VIBRAMYCIN) 100 MG capsule Take 1 capsule (100 mg total) by mouth 2 (two) times daily. 11/19/17   Copland, Gay Filler, MD  Fish Oil-Cholecalciferol (FISH OIL +  D3 PO) Take 1 capsule by mouth daily.    [provider]  fluticasone (FLONASE) 50 MCG/ACT nasal spray Place 2 sprays into both nostrils daily. 11/19/17   Copland, Gay Filler, MD  JANUMET 50-1000 MG tablet TAKE 1 TABLET BY MOUTH TWICE DAILY WITH A MEAL 12/12/17   Carollee Herter, Alferd Apa, DO  losartan (COZAAR) 25 MG tablet Take 1 tablet (25 mg total) by mouth daily. 04/30/17   Roma Schanz R, DO  montelukast (SINGULAIR) 10 MG tablet Take 1 tablet (10 mg total) by mouth at bedtime. 04/30/17   [name], DO  Multiple Vitamin (MULTIVITAMIN) tablet Take 1 tablet by mouth daily. OVER 50 VITAMIN    [provider]  promethazine-dextromethorphan (PROMETHAZINE-DM) 6.25-15 MG/5ML syrup Take 5 mLs by mouth 4 (four) times daily as needed. 11/19/17   Copland, Gay Filler, MD  promethazine-dextromethorphan (PROMETHAZINE-DM) 6.25-15 MG/5ML syrup Take 5 mLs by mouth 4 (four) times daily as needed. 04/17/18   [name], DO  repaglinide (PRANDIN) 0.5 MG tablet Take 1 tablet (0.5 mg total) by mouth 3 (three) times daily before meals. 05/15/17   [name], DO    Family History Family History  Problem Relation Age of Onset  . Breast cancer Mother   . Diabetes Mother   . Dementia Mother   . Liver cancer Mother   . Heart disease Father        heart attack  . Diabetes Brother   . Breast cancer Sister   . Colon cancer Neg Hx   . Stomach cancer Neg Hx     Social History Social History   Tobacco Use  . Smoking status: Former Smoker    Packs/day: 1.00    Years: 20.00    Pack years: 20.00    Types: Cigarettes    Last attempt to quit: 01/05/1996    Years since quitting: 22.3  . Smokeless tobacco: Never Used  Substance Use Topics  . Alcohol use: Not Currently    Alcohol/week: 4.0 standard drinks    Types: 4 Glasses of wine per week    Frequency: Never  . Drug use: No     Allergies   Aspirin   Review of Systems Review of Systems  Constitutional:  Negative for activity change, appetite change, chills, fatigue and fever.  HENT: Positive for congestion. Negative for ear pain, rhinorrhea, sinus pressure, sore throat and trouble swallowing.   Eyes: Negative for discharge and redness.  Respiratory: Positive for cough and shortness of breath. Negative for chest tightness.   Cardiovascular: Negative for chest pain.  Gastrointestinal: Negative for abdominal pain, diarrhea, nausea and vomiting.  Musculoskeletal: Negative for myalgias.  Skin: Negative for rash.  Neurological:  Negative for dizziness, light-headedness and headaches.     Physical Exam Triage Vital Signs ED Triage Vitals  Enc Vitals Group     BP 04/21/18 1226 138/75     Pulse Rate 04/21/18 1226 82     Resp 04/21/18 1226 16     Temp 04/21/18 1226 98.1 F (36.7 C)     Temp Source 04/21/18 1226 Oral     SpO2 04/21/18 1226 100 %     Weight --      Height --      Head Circumference --      Peak Flow --      Pain Score 04/21/18 1236 3     Pain Loc --      Pain Edu? --      Excl. in [place]? --    No data found.  Updated Vital Signs BP 138/75 (BP Location: Right Arm)   Pulse 82   Temp 98.1 F (36.7 C) (Oral)   Resp 16   SpO2 100%   Visual Acuity Right Eye Distance:   Left Eye Distance:   Bilateral Distance:    Right Eye Near:   Left Eye Near:    Bilateral Near:     Physical Exam Vitals signs and nursing note reviewed.  Constitutional:      General: She is not in acute distress.    Appearance: She is well-developed.  HENT:     Head: Normocephalic and atraumatic.     Ears:     Comments: Bilateral ears without tenderness to palpation of external auricle, tragus and mastoid, EAC's without erythema or swelling, TM's with good bony landmarks and cone of light. Non erythematous.     Mouth/Throat:     Comments: Oral mucosa pink and moist, no tonsillar enlargement or exudate. Posterior pharynx patent and nonerythematous, no uvula deviation or swelling. Normal  phonation. Eyes:     Conjunctiva/sclera: Conjunctivae normal.  Neck:     Musculoskeletal: Neck supple.  Cardiovascular:     Rate and Rhythm: Normal rate and regular rhythm.     Heart sounds: No murmur.  Pulmonary:     Effort: Pulmonary effort is normal. No respiratory distress.     Breath sounds: Normal breath sounds.     Comments: Breathing comfortably at rest, CTABL, no wheezing, rales or other adventitious sounds auscultated No accessory muscle use Abdominal:     Palpations: Abdomen is soft.     Tenderness: There is no abdominal tenderness.  Skin:    General: Skin is warm and dry.  Neurological:     Mental Status: She is alert.      UC Treatments / Results  Labs (all labs ordered are listed, but only abnormal results are displayed) Labs Reviewed - No data to display  EKG None  Radiology No results found.  Procedures Procedures (including critical care time)  Medications Ordered in UC Medications - No data to display  Initial Impression / Assessment and Plan / UC Course  I have reviewed the triage vital signs and the nursing notes.  Pertinent labs & imaging results that were available during my care of the patient were reviewed by me and considered in my medical decision making (see chart for details).     63 year old female with 1 day of shortness of breath.  Patient without fever, tachycardia or hypoxia.  Lungs clear to auscultation throughout.  Recently been on doxycycline and Omnicef for sinusitis.  Given recent antibiotics, stable vital signs as well as lungs clear  to auscultation and patient does not appear to be struggling to breathe, do not suspect underlying pneumonia or respiratory complications related to COVID-19.  At this time will recommend patient to continue course of Omnicef, cough syrup as needed and monitor for symptoms to continue to improve.  Rest and drink plenty of fluids.did offer patient albuterol inhaler to use as needed when feeling short  of breath, but she was resistant to this as this makes her heart race and have palpitations.  No shortness of breath likely related to constellation of URI symptoms versus underlying PE.  Advised to continue to monitor and follow-up if shortness of breath worsening.  Discussed strict return precautions. Patient verbalized understanding and is agreeable with plan.  Final Clinical Impressions(s) / UC Diagnoses   Final diagnoses:  Viral URI with cough     Discharge Instructions     Rest, Drink fluids Continue to use cough syrup and antibiotic  Follow up if developing increased shortness of breath or difficulty breathing    ED Prescriptions    None     Controlled Substance Prescriptions [place] Controlled Substance Registry consulted? Not Applicable   Janith Lima, PA-C 04/21/18 [address] [place], [place] 04/21/18 1303

## 2018-04-21 NOTE — Discharge Instructions (Signed)
Rest, Drink fluids Continue to use cough syrup and antibiotic  Follow up if developing increased shortness of breath or difficulty breathing

## 2018-04-21 NOTE — ED Triage Notes (Signed)
Pt here for increased cough and congestion with some SOB today; pt currently being treated for bronchitis; pt returned for [place] on 04/03/18

## 2018-04-25 ENCOUNTER — Other Ambulatory Visit: Payer: Self-pay

## 2018-04-25 ENCOUNTER — Ambulatory Visit (INDEPENDENT_AMBULATORY_CARE_PROVIDER_SITE_OTHER): Payer: 59 | Admitting: Family Medicine

## 2018-04-25 ENCOUNTER — Encounter: Payer: Self-pay | Admitting: Family Medicine

## 2018-04-25 ENCOUNTER — Ambulatory Visit: Payer: Self-pay | Admitting: *Deleted

## 2018-04-25 ENCOUNTER — Other Ambulatory Visit: Payer: Self-pay | Admitting: Family Medicine

## 2018-04-25 DIAGNOSIS — J0140 Acute pansinusitis, unspecified: Secondary | ICD-10-CM | POA: Diagnosis not present

## 2018-04-25 DIAGNOSIS — R0981 Nasal congestion: Secondary | ICD-10-CM

## 2018-04-25 DIAGNOSIS — I10 Essential (primary) hypertension: Secondary | ICD-10-CM

## 2018-04-25 MED ORDER — FLUTICASONE PROPIONATE 50 MCG/ACT NA SUSP: 2.0000 | Freq: Every day | NASAL | 6 refills | Status: DC

## 2018-04-25 NOTE — Telephone Encounter (Signed)
Pt states on second course of antibiotics and "Just not feeling any better." States has 2 days left of cefdinir, started 04/17/2018. Seen at [facility] 04/21/2018,Viral URI. Pt states cough is resolved, LGT this am 99.6, states rechecked later in am prior to call, 97.5, oral, digital. States had taken RXed cough medication, "Made me loopy." Denies any further cough however.States ears are "Clogged." Pt requesting appt. States "I'm tired of feeling sick, it's been 20 days." Concerned as she had traveled to [place] early March. Call transferred to practice for consideration of virtual appt.  Reason for Disposition . Requesting regular office appointment  Answer Assessment - Initial Assessment Questions 1. REASON FOR CALL or QUESTION: "What is your reason for calling today?" or "How can I best help you?" or "What question do you have that I can help answer?"    "Don't know if I need to be seen again, not feeling any better."  Protocols used: INFORMATION ONLY CALL-A-AH

## 2018-04-25 NOTE — Telephone Encounter (Signed)
Pt had virtual visit today at 2:15.

## 2018-04-25 NOTE — Assessment & Plan Note (Signed)
Finish abx  flonase And otc antiinflammatory Pt did not want to use pred taper Call or rto prn

## 2018-04-25 NOTE — Progress Notes (Signed)
Virtual Visit via Video Note  I connected with Megan [name] on 04/25/18 at  2:15 PM EDT by a video enabled telemedicine application and verified that I am speaking with the correct person using two identifiers.   I discussed the limitations of evaluation and management by telemedicine and the availability of in person appointments. The patient expressed understanding and agreed to proceed.  History of Present Illness: Pt is home --- f/u on er visit for uri.  Pt was given cough med 21 days ago in [place].  She has been taking the omnicef    Her ears feel full  No fevers + sinus pressure      Observations/Objective: No fevers no sob Pt was unable to check other vs No cp,   Assessment and Plan: 1. Acute non-recurrent pansinusitis flonase , finish abx otc antihistamine rto prn  Pt did not want pred taper  2. Nasal congestion   - fluticasone (FLONASE) 50 MCG/ACT nasal spray; Place 2 sprays into both nostrils daily.  Dispense: 16 g; Refill: 6   - fluticasone (FLONASE) 50 MCG/ACT nasal spray; Place 2 sprays into both nostrils daily.  Dispense: 16 g; Refill: 6 Follow Up Instructions:    I discussed the assessment and treatment plan with the patient. The patient was provided an opportunity to ask questions and all were answered. The patient agreed with the plan and demonstrated an understanding of the instructions.   The patient was advised to call back or seek an in-person evaluation if the symptoms worsen or if the condition fails to improve as anticipated.  I provided 20 minutes of non-face-to-face time during this encounter.   [name], DO

## 2018-05-03 ENCOUNTER — Telehealth: Payer: Self-pay | Admitting: Family Medicine

## 2018-05-03 NOTE — Telephone Encounter (Signed)
Copied from [place] [id number]. Topic: General - Inquiry >> May 03, 2018 11:35 AM Richardo Priest, NT wrote: Reason for CRM: Patient is requesting a medication for her yeast infection due to it being caused by her 2 rounds of antibiotics. Call back is [phone]. Preferred pharmacy is [facility] ([address]), [place] [phone] (Phone) [phone] (Fax)

## 2018-05-06 ENCOUNTER — Other Ambulatory Visit: Payer: Self-pay | Admitting: *Deleted

## 2018-05-06 MED ORDER — FLUCONAZOLE 150 MG PO TABS: ORAL_TABLET | ORAL | 0 refills | Status: DC

## 2018-05-06 NOTE — Telephone Encounter (Signed)
Fluconazole reorder per antibiotic protocol per Dr. Etter Sjogren .

## 2018-05-19 ENCOUNTER — Other Ambulatory Visit: Payer: Self-pay | Admitting: Family Medicine

## 2018-05-19 DIAGNOSIS — I10 Essential (primary) hypertension: Secondary | ICD-10-CM

## 2018-05-22 ENCOUNTER — Other Ambulatory Visit: Payer: Self-pay | Admitting: Obstetrics and Gynecology

## 2018-05-22 DIAGNOSIS — Z1329 Encounter for screening for other suspected endocrine disorder: Secondary | ICD-10-CM

## 2018-05-30 ENCOUNTER — Ambulatory Visit
Admission: RE | Admit: 2018-05-30 | Discharge: 2018-05-30 | Disposition: A | Discharge status: Home / Self Care | Payer: 59 | Source: Ambulatory Visit | Attending: Obstetrics and Gynecology | Admitting: Obstetrics and Gynecology

## 2018-05-30 DIAGNOSIS — Z1329 Encounter for screening for other suspected endocrine disorder: Secondary | ICD-10-CM

## 2018-06-19 ENCOUNTER — Other Ambulatory Visit: Payer: Self-pay | Admitting: Family Medicine

## 2018-06-19 DIAGNOSIS — IMO0002 Reserved for concepts with insufficient information to code with codable children: Secondary | ICD-10-CM

## 2018-06-19 DIAGNOSIS — E1151 Type 2 diabetes mellitus with diabetic peripheral angiopathy without gangrene: Secondary | ICD-10-CM

## 2018-06-20 NOTE — Telephone Encounter (Signed)
Patient calling to check status of medication refill. States that she is completely out of this medication. Advised of refill policy and they we are awaiting approval from Dr Etter Sjogren. Please advise.

## 2018-08-01 ENCOUNTER — Other Ambulatory Visit: Payer: Self-pay | Admitting: Obstetrics and Gynecology

## 2018-08-01 DIAGNOSIS — Z803 Family history of malignant neoplasm of breast: Secondary | ICD-10-CM

## 2018-08-01 DIAGNOSIS — Z9189 Other specified personal risk factors, not elsewhere classified: Secondary | ICD-10-CM

## 2018-08-09 ENCOUNTER — Emergency Department (HOSPITAL_COMMUNITY)
Admission: EM | Admit: 2018-08-09 | Discharge: 2018-08-09 | Disposition: A | Discharge status: Home / Self Care | Payer: 59 | Attending: Emergency Medicine | Admitting: Emergency Medicine

## 2018-08-09 ENCOUNTER — Ambulatory Visit (INDEPENDENT_AMBULATORY_CARE_PROVIDER_SITE_OTHER): Payer: 59 | Admitting: Family Medicine

## 2018-08-09 ENCOUNTER — Encounter: Payer: Self-pay | Admitting: Family Medicine

## 2018-08-09 ENCOUNTER — Ambulatory Visit: Payer: Self-pay | Admitting: *Deleted

## 2018-08-09 ENCOUNTER — Other Ambulatory Visit: Payer: Self-pay

## 2018-08-09 VITALS — BP 193/96 | HR 91 | Ht 65.5 in | Wt 190.0 lb

## 2018-08-09 DIAGNOSIS — I10 Essential (primary) hypertension: Secondary | ICD-10-CM

## 2018-08-09 DIAGNOSIS — R05 Cough: Secondary | ICD-10-CM | POA: Diagnosis not present

## 2018-08-09 DIAGNOSIS — J069 Acute upper respiratory infection, unspecified: Secondary | ICD-10-CM

## 2018-08-09 DIAGNOSIS — Z20828 Contact with and (suspected) exposure to other viral communicable diseases: Secondary | ICD-10-CM | POA: Diagnosis not present

## 2018-08-09 DIAGNOSIS — Z7984 Long term (current) use of oral hypoglycemic drugs: Secondary | ICD-10-CM | POA: Diagnosis not present

## 2018-08-09 DIAGNOSIS — Z79899 Other long term (current) drug therapy: Secondary | ICD-10-CM | POA: Insufficient documentation

## 2018-08-09 DIAGNOSIS — Z87891 Personal history of nicotine dependence: Secondary | ICD-10-CM | POA: Diagnosis not present

## 2018-08-09 DIAGNOSIS — E119 Type 2 diabetes mellitus without complications: Secondary | ICD-10-CM | POA: Insufficient documentation

## 2018-08-09 DIAGNOSIS — R059 Cough, unspecified: Secondary | ICD-10-CM

## 2018-08-09 LAB — I-STAT CHEM 8, ED
BUN: 7 mg/dL — ABNORMAL LOW (ref 8–23)
Calcium, Ion: 1.11 mmol/L — ABNORMAL LOW (ref 1.15–1.40)
Chloride: 105 mmol/L (ref 98–111)
Creatinine, Ser: 0.7 mg/dL (ref 0.44–1.00)
Glucose, Bld: 195 mg/dL — ABNORMAL HIGH (ref 70–99)
HCT: 42 % (ref 36.0–46.0)
Hemoglobin: 14.3 g/dL (ref 12.0–15.0)
Potassium: 3.4 mmol/L — ABNORMAL LOW (ref 3.5–5.1)
Sodium: 140 mmol/L (ref 135–145)
TCO2: 23 mmol/L (ref 22–32)

## 2018-08-09 MED ORDER — FLUTICASONE PROPIONATE 50 MCG/ACT NA SUSP: 2.0000 | Freq: Every day | NASAL | 6 refills | Status: DC

## 2018-08-09 MED ORDER — LORATADINE 10 MG PO TABS: 10.0000 mg | ORAL_TABLET | Freq: Every day | ORAL | 11 refills | Status: DC

## 2018-08-09 NOTE — ED Notes (Signed)
Pt provided with a happy meal and drink per Dr. Sabra Heck.

## 2018-08-09 NOTE — Telephone Encounter (Signed)
Pt called after rechecking her b/p and it is 188/107 now. She denies shortness of breath, weakness, chest pain, headache or blurred vision. She is stating that she is stressed right now. She is at work. She rechecked her b/p just now and she gave the top number only which was 201. She is advised to go to to an ED to be assessed. She voiced understanding and leaving to go right now. Attempted to call [facility] at [facility], no answer. Routing to the practice for review.  Reason for Disposition . [3] Systolic BP  >= 500 OR Diastolic >= 938  AND [1] having NO cardiac or neurologic symptoms  Answer Assessment - Initial Assessment Questions 1. BLOOD PRESSURE: "What is the blood pressure?" "Did you take at least two measurements 5 minutes apart?"    188/107 and 201/?, pt did not get the whole reading. 2. ONSET: "When did you take your blood pressure?"     This morning and now 3. HOW: "How did you obtain the blood pressure?" (e.g., visiting nurse, automatic home BP monitor)     Automatic home BP monitor 4. HISTORY: "Do you have a history of high blood pressure?"     yes 5. MEDICATIONS: "Are you taking any medications for blood pressure?" "Have you missed any doses recently?"    No missed any medications 6. OTHER SYMPTOMS: "Do you have any symptoms?" (e.g., headache, chest pain, blurred vision, difficulty breathing, weakness)     no 7. PREGNANCY: "Is there any chance you are pregnant?" "When was your last menstrual period?"     n/a  Protocols used: HIGH BLOOD PRESSURE-A-AH

## 2018-08-09 NOTE — ED Notes (Signed)
Patient verbalizes understanding of discharge instructions. Opportunity for questioning and answers were provided. Armband removed by staff, pt discharged from ED.  

## 2018-08-09 NOTE — Discharge Instructions (Signed)
Your blood work was unremarkable and reassuring.  Please take your blood pressure daily at about 10:00 in the morning approximately 2 hours after you take your morning blood pressure medicines.  If this number is consistently elevated then you may want to start taking a double dose of your amlodipine at 10 mg instead of 5 mg.  Please avoid taking over-the-counter decongestants or antihistamines other than taking Flonase and Claritin.  If you need something more you may want to try Coricidin which is blood pressure safe.

## 2018-08-09 NOTE — Telephone Encounter (Signed)
Noted Pt has been taking decongestants  She was advised during tele visit to stop

## 2018-08-09 NOTE — Assessment & Plan Note (Signed)
bp very high today but pt states she had to run to the bp cuff and she has taken decongestant Pt will call later with a repeat bp reading

## 2018-08-09 NOTE — ED Triage Notes (Signed)
Pt endorses being sent by pcp due to htn. Pt was originally being seen for covid test due to multiple people at work being positive. Has no covid sx. Hypertensive in triage. Has hx of same. Takes BP meds.

## 2018-08-09 NOTE — Progress Notes (Signed)
Virtual Visit via Video Note  I connected with Megan [name] on 08/09/18 at  1:30 PM EDT by a video enabled telemedicine application and verified that I am speaking with the correct person using two identifiers.  --- unable to get microphone and camera were not working   Location: Patient: work Provider: home    I discussed the limitations of evaluation and management by telemedicine and the availability of in person appointments. The patient expressed understanding and agreed to proceed.  History of Present Illness: Pt is at work c/o exposure to covid  Pt c/o cough and congestion    Observations/Objective: Marland Kitchen Vitals:   08/09/18 1338  BP: (!) 193/96  Pulse: 91  pt had to run to the bp machine--- she will re check it   Follow Up Instructions: 1. Cough  - Novel Coronavirus, NAA ([facility]) - loratadine (CLARITIN) 10 MG tablet; Take 1 tablet (10 mg total) by mouth daily.  Dispense: 30 tablet; Refill: 11 - fluticasone (FLONASE) 50 MCG/ACT nasal spray; Place 2 sprays into both nostrils daily.  Dispense: 16 g; Refill: 6  2. Viral upper respiratory tract infection Call is symptoms do not improve Do not take any decongestants  - loratadine (CLARITIN) 10 MG tablet; Take 1 tablet (10 mg total) by mouth daily.  Dispense: 30 tablet; Refill: 11 - fluticasone (FLONASE) 50 MCG/ACT nasal spray; Place 2 sprays into both nostrils daily.  Dispense: 16 g; Refill: 6  3. Essential hypertension Pt bp is very high-- she will call later with a repeat reading She states she had to run to the bp cuff and she took a decongestant for the last few days     I discussed the assessment and treatment plan with the patient. The patient was provided an opportunity to ask questions and all were answered. The patient agreed with the plan and demonstrated an understanding of the instructions.   The patient was advised to call back or seek an in-person evaluation if the symptoms worsen or if the condition fails  to improve as anticipated.  I provided 15 minutes of non-face-to-face time during this encounter.   [name], DO

## 2018-08-09 NOTE — ED Provider Notes (Signed)
[facility] EMERGENC[facility] DEPARTMENT Provider Note   CSN: [id number] Arrival date & time: 08/09/18  1508    History   Chief Complaint Chief Complaint  Patient presents with  . Hypertension    HPI Megan [name] is a 63 y.o. female.     HPI  The patient is a 63 year old female, she has a known history of diabetes as well as hypertension, hyperlipidemia, she is under a significant amount of stress with work and life and states that during her visit with her family doctor today to request a test for coronavirus she was found to be hypertensive when she went to her office at work and found herself to be in the 341 systolic range.  The patient denies having any symptoms with this including chest pain coughing shortness of breath headache numbness weakness or any other symptoms however it was recommended that she be seen immediately because of severe hypertension.  I have reviewed the notes from the office visit, she also had a repeat measurement which was over 200.  The patient is taking 5 mg of Norvasc and 25 mg of losartan daily, she does not miss her medications.  She is compliant with her diabetic medications as well and is set to see her nephrologist on August 8.  While the patient has been waiting in the emergency department in the waiting room her blood pressure has dropped spontaneously now to 153/78.  She has been taking some over-the-counter decongestants recently because of some allergy type symptoms.  Past Medical History:  Diagnosis Date  . Allergy   . Arthritis    fingers  . Colon polyp   . Diabetes mellitus   . Diverticulosis   . GERD (gastroesophageal reflux disease)    occasionally  . H/O hiatal hernia    ?  Marland Kitchen Hemorrhoids   . Hyperlipidemia    takes fish oil  . Hypertension   . Infectious colitis   . Ventral hernia     Patient Active Problem List   Diagnosis Date Noted  . Acute non-recurrent pansinusitis 04/25/2018  . Bronchitis 05/21/2017   . CAP (community acquired pneumonia) 10/15/2013  . Wheezing 09/29/2013  . Obesity (BMI 30-39.9) 07/01/2013  . Pain, knee 10/02/2012  . Gastrocnemius muscle strain 10/02/2012  . Wrist sprain 10/02/2012  . Thyroid nodule 05/03/2012  . Rash and nonspecific skin eruption 03/14/2012  . HAIR LOSS 03/28/2010  . COLONIC POLYPS, HX OF 11/09/2009  . HEMANGIOMA, HEPATIC 11/04/2009  . UNSPECIFIED ANEMIA 11/04/2009  . DIVERTICULOSIS, COLON 11/04/2009  . FATTY LIVER DISEASE 11/04/2009  . PALPITATIONS 09/06/2009  . Chronic fatigue 08/07/2008  . ALLERGIC RHINITIS 04/10/2007  . Diabetes mellitus type II, controlled ([place]) 12/21/2006  . Hyperlipidemia 04/12/2006  . Essential hypertension 04/12/2006    Past Surgical History:  Procedure Laterality Date  . colonscopy    . DIAGNOSTIC LAPAROSCOPY    . FOOT SURGERY Bilateral    hammer toes  . HEMORRHOID SURGERY    . HERNIA REPAIR  01/20/2011   incisional  . INCISIONAL HERNIA REPAIR  01/20/2011   Procedure: LAPAROSCOPIC INCISIONAL HERNIA;  Surgeon: Judieth Keens, DO;  Location: [facility] ORS;  Service: General;  Laterality: N/A;  Laparoscopic repair of a recurrent incisional hernia with Mesh  . INGUINAL HERNIA REPAIR     x's 3  . VENTRAL HERNIA REPAIR       OB History   No obstetric history on file.      Home Medications    Prior  to Admission medications   Medication Sig Start Date End Date Taking? Authorizing Provider  amLODipine (NORVASC) 5 MG tablet TAKE 1 TABLET BY MOUTH EVERY DAY 05/20/18   Carollee Herter, Alferd Apa, DO  Cyanocobalamin (VITAMIN B 12 PO) Take 1 tablet by mouth daily.    [provider]  Fish Oil-Cholecalciferol (FISH OIL + D3 PO) Take 1 capsule by mouth daily.    [provider]  fluconazole (DIFLUCAN) 150 MG tablet Take 1 by mouth once and repeat in 3 days if needed. 05/06/18   Roma Schanz R, DO  fluticasone (FLONASE) 50 MCG/ACT nasal spray Place 2 sprays into both nostrils daily. 04/25/18   Roma Schanz R, DO  fluticasone (FLONASE) 50 MCG/ACT nasal spray Place 2 sprays into both nostrils daily. 08/09/18   Lowne Chase, Yvonne R, DO  JANUMET 50-1000 MG tablet TAKE 1 TABLET BY MOUTH TWICE DAILY WITH A MEAL 06/20/18   Debbrah Alar, NP  loratadine (CLARITIN) 10 MG tablet Take 1 tablet (10 mg total) by mouth daily. 08/09/18   [name], DO  losartan (COZAAR) 25 MG tablet Take 1 tablet by mouth once daily 04/29/18   Carollee Herter, Kendrick Fries R, DO  montelukast (SINGULAIR) 10 MG tablet Take 1 tablet (10 mg total) by mouth at bedtime. 04/30/17   [name], DO  Multiple Vitamin (MULTIVITAMIN) tablet Take 1 tablet by mouth daily. OVER 50 VITAMIN    [provider]  repaglinide (PRANDIN) 0.5 MG tablet Take 1 tablet (0.5 mg total) by mouth 3 (three) times daily before meals. 05/15/17   [name], DO    Family History Family History  Problem Relation Age of Onset  . Breast cancer Mother   . Diabetes Mother   . Dementia Mother   . Liver cancer Mother   . Heart disease Father        heart attack  . Diabetes Brother   . Breast cancer Sister   . Colon cancer Neg Hx   . Stomach cancer Neg Hx     Social History Social History   Tobacco Use  . Smoking status: Former Smoker    Packs/day: 1.00    Years: 20.00    Pack years: 20.00    Types: Cigarettes    Quit date: 01/05/1996    Years since quitting: 22.6  . Smokeless tobacco: Never Used  Substance Use Topics  . Alcohol use: Not Currently    Alcohol/week: 4.0 standard drinks    Types: 4 Glasses of wine per week    Frequency: Never  . Drug use: No     Allergies   Aspirin   Review of Systems Review of Systems  All other systems reviewed and are negative.    Physical Exam Updated Vital Signs BP (!) 165/86 (BP Location: Right Arm)   Pulse 84   Temp 99 F (37.2 C) (Oral)   Resp 18   SpO2 100%   Physical Exam Vitals signs and nursing note reviewed.  Constitutional:      General: She  is not in acute distress.    Appearance: She is well-developed.  HENT:     Head: Normocephalic and atraumatic.     Mouth/Throat:     Pharynx: No oropharyngeal exudate.  Eyes:     General: No scleral icterus.       Right eye: No discharge.        Left eye: No discharge.     Conjunctiva/sclera: Conjunctivae  normal.     Pupils: Pupils are equal, round, and reactive to light.  Neck:     Musculoskeletal: Normal range of motion and neck supple.     Thyroid: No thyromegaly.     Vascular: No JVD.  Cardiovascular:     Rate and Rhythm: Normal rate and regular rhythm.     Heart sounds: Normal heart sounds. No murmur. No friction rub. No gallop.   Pulmonary:     Effort: Pulmonary effort is normal. No respiratory distress.     Breath sounds: Normal breath sounds. No wheezing or rales.  Abdominal:     General: Bowel sounds are normal. There is no distension.     Palpations: Abdomen is soft. There is no mass.     Tenderness: There is no abdominal tenderness.  Musculoskeletal: Normal range of motion.        General: No tenderness.  Lymphadenopathy:     Cervical: No cervical adenopathy.  Skin:    General: Skin is warm and dry.     Findings: No erythema or rash.  Neurological:     Mental Status: She is alert.     Coordination: Coordination normal.     Comments: Normal strength sensation and cranial nerves III through XII diffusely.  Normal gait, normal memory.  Psychiatric:        Behavior: Behavior normal.      ED Treatments / Results  Labs (all labs ordered are listed, but only abnormal results are displayed) Labs Reviewed  I-STAT CHEM 8, ED - Abnormal; Notable for the following components:      Result Value   Potassium 3.4 (*)    BUN 7 (*)    Glucose, Bld 195 (*)    Calcium, Ion 1.11 (*)    All other components within normal limits  NOVEL CORONAVIRUS, NAA (HOSPITAL ORDER, SEND-OUT TO REF LAB)    EKG None  Radiology No results found.  Procedures Procedures (including  critical care time)  Medications Ordered in ED Medications - No data to display   Initial Impression / Assessment and Plan / ED Course  I have reviewed the triage vital signs and the nursing notes.  Pertinent labs & imaging results that were available during my care of the patient were reviewed by me and considered in my medical decision making (see chart for details).  Clinical Course as of Aug 08 2144  Fri Aug 09, 7671  [id number] [facility] panel is normal should have a glucose of 195, patient stable for discharge   [BM]    Clinical Course User Index [BM] Noemi Chapel, MD       The patient is very well-appearing, she is essentially normotensive at this point.  I have encouraged her to continue to take her blood pressure at home regularly and that she can double the dose of her amlodipine or her losartan if she finds that she is consistently hypertensive.  I have also recommended medications other than what she has been taking to help prevent ongoing hypertension.  The patient is agreeable to this plan, we will check a metabolic panel prior to discharge.  He does not want any medications here  Labs unremarkable, stable for discharge.  Final Clinical Impressions(s) / ED Diagnoses   Final diagnoses:  Hypertension, unspecified type    ED Discharge Orders    None       Noemi Chapel, MD 08/09/18 2146

## 2018-08-11 LAB — NOVEL CORONAVIRUS, NAA (HOSP ORDER, SEND-OUT TO REF LAB; TAT 18-24 HRS): SARS-CoV-2, NAA: NOT DETECTED

## 2018-08-12 ENCOUNTER — Other Ambulatory Visit (INDEPENDENT_AMBULATORY_CARE_PROVIDER_SITE_OTHER): Payer: 59

## 2018-08-12 ENCOUNTER — Other Ambulatory Visit: Payer: Self-pay

## 2018-08-12 ENCOUNTER — Ambulatory Visit (INDEPENDENT_AMBULATORY_CARE_PROVIDER_SITE_OTHER)
Admission: RE | Admit: 2018-08-12 | Discharge: 2018-08-12 | Disposition: A | Discharge status: Home / Self Care | Payer: 59 | Source: Ambulatory Visit | Attending: Family Medicine | Admitting: Family Medicine

## 2018-08-12 ENCOUNTER — Ambulatory Visit (INDEPENDENT_AMBULATORY_CARE_PROVIDER_SITE_OTHER): Payer: 59 | Admitting: Family Medicine

## 2018-08-12 ENCOUNTER — Encounter: Payer: Self-pay | Admitting: Family Medicine

## 2018-08-12 VITALS — BP 146/80

## 2018-08-12 DIAGNOSIS — M545 Low back pain, unspecified: Secondary | ICD-10-CM

## 2018-08-12 DIAGNOSIS — I10 Essential (primary) hypertension: Secondary | ICD-10-CM | POA: Diagnosis not present

## 2018-08-12 LAB — URINALYSIS, ROUTINE W REFLEX MICROSCOPIC
Bilirubin Urine: NEGATIVE
Hgb urine dipstick: NEGATIVE
Leukocytes,Ua: NEGATIVE
Nitrite: NEGATIVE
Specific Gravity, Urine: >=1.03 — AB (ref 1.000–1.030)
Urine Glucose: NEGATIVE
Urobilinogen, UA: 0.2 (ref 0.0–1.0)
pH: 5 (ref 5.0–8.0)

## 2018-08-12 MED ORDER — LOSARTAN POTASSIUM 50 MG PO TABS: 50.0000 mg | ORAL_TABLET | Freq: Every day | ORAL | 3 refills | Status: DC

## 2018-08-12 NOTE — Progress Notes (Deleted)
Patient ID: Megan [name], female    DOB: 1955-06-07  Age: 63 y.o. MRN: [id number]    Subjective:  Subjective  HPI Megan [name] presents for ***  Review of Systems  History Past Medical History:  Diagnosis Date  . Allergy   . Arthritis    fingers  . Colon polyp   . Diabetes mellitus   . Diverticulosis   . GERD (gastroesophageal reflux disease)    occasionally  . H/O hiatal hernia    ?  Marland Kitchen Hemorrhoids   . Hyperlipidemia    takes fish oil  . Hypertension   . Infectious colitis   . Ventral hernia     She has a past surgical history that includes Inguinal hernia repair; Hemorrhoid surgery; Ventral hernia repair; Foot surgery (Bilateral); colonscopy; Diagnostic laparoscopy; Incisional hernia repair (01/20/2011); and Hernia repair (01/20/2011).   Her family history includes Breast cancer in her mother and sister; Dementia in her mother; Diabetes in her brother and mother; Heart disease in her father; Liver cancer in her mother.She reports that she quit smoking about 22 years ago. Her smoking use included cigarettes. She has a 20.00 pack-year smoking history. She has never used smokeless tobacco. She reports previous alcohol use of about 4.0 standard drinks of alcohol per week. She reports that she does not use drugs.  Current Outpatient Medications on File Prior to Visit  Medication Sig Dispense Refill  . amLODipine (NORVASC) 5 MG tablet TAKE 1 TABLET BY MOUTH EVERY DAY 90 tablet 1  . Cyanocobalamin (VITAMIN B 12 PO) Take 1 tablet by mouth daily.    . Fish Oil-Cholecalciferol (FISH OIL + D3 PO) Take 1 capsule by mouth daily.    . fluconazole (DIFLUCAN) 150 MG tablet Take 1 by mouth once and repeat in 3 days if needed. 2 tablet 0  . fluticasone (FLONASE) 50 MCG/ACT nasal spray Place 2 sprays into both nostrils daily. 16 g 6  . fluticasone (FLONASE) 50 MCG/ACT nasal spray Place 2 sprays into both nostrils daily. 16 g 6  . JANUMET 50-1000 MG tablet TAKE 1 TABLET BY MOUTH TWICE  DAILY WITH A MEAL 180 tablet 0  . loratadine (CLARITIN) 10 MG tablet Take 1 tablet (10 mg total) by mouth daily. 30 tablet 11  . losartan (COZAAR) 25 MG tablet Take 1 tablet by mouth once daily 90 tablet 1  . montelukast (SINGULAIR) 10 MG tablet Take 1 tablet (10 mg total) by mouth at bedtime. 30 tablet 3  . Multiple Vitamin (MULTIVITAMIN) tablet Take 1 tablet by mouth daily. OVER 50 VITAMIN    . repaglinide (PRANDIN) 0.5 MG tablet Take 1 tablet (0.5 mg total) by mouth 3 (three) times daily before meals. 90 tablet 0   No current facility-administered medications on file prior to visit.      Objective:  Objective  Physical Exam Nursing note reviewed.    BP (!) 146/80  Wt Readings from Last 3 Encounters:  08/09/18 190 lb (86.2 kg)  11/19/17 191 lb (86.6 kg)  05/21/17 193 lb 3.2 oz (87.6 kg)     Lab Results  Component Value Date   WBC 8.9 04/30/2017   HGB 14.3 08/09/2018   HCT 42.0 08/09/2018   PLT 431.0 (H) 04/30/2017   GLUCOSE 195 (H) 08/09/2018   CHOL 138 03/07/2018   TRIG 111.0 03/07/2018   HDL 47.70 03/07/2018   LDLDIRECT 88.0 04/21/2014   LDLCALC 68 03/07/2018   ALT 29 03/07/2018   AST 22 03/07/2018   NA  140 08/09/2018   K 3.4 (L) 08/09/2018   CL 105 08/09/2018   CREATININE 0.70 08/09/2018   BUN 7 (L) 08/09/2018   CO2 28 03/07/2018   TSH 0.76 04/30/2017   HGBA1C 6.9 (H) 03/07/2018   MICROALBUR 64.8 (H) 11/02/2014    No results found.   Assessment & Plan:  Plan  I am having Megan [name] maintain her multivitamin, Fish Oil-Cholecalciferol (FISH OIL + D3 PO), Cyanocobalamin (VITAMIN B 12 PO), montelukast, repaglinide, fluticasone, losartan, fluconazole, amLODipine, Janumet, loratadine, and fluticasone.  No orders of the defined types were placed in this encounter.   Problem List Items Addressed This Visit    None      Follow-up: No follow-ups on file.  [name], DO

## 2018-08-12 NOTE — Progress Notes (Signed)
Virtual Visit via Video Note  I connected with Megan [name] on 08/12/18 at  2:00 PM EDT by a video enabled telemedicine application and verified that I am speaking with the correct person using two identifiers.  Location: Patient: home  Provider: office    I discussed the limitations of evaluation and management by telemedicine and the availability of in person appointments. The patient expressed understanding and agreed to proceed.  History of Present Illness: Pt is home c/o elevated bp-- she was in the er Friday-- pt denies any cp, sob  She is also c/o low back pain and urinary frequency.  No dysuria    Observations/Objective: Vitals:   08/12/18 1358  BP: (!) 146/80    Pt is in nad Er visit reviewed  Assessment and Plan: 1. Essential hypertension con't nor[employer]asc [employer] cozaar to 50 mg daily  - losartan (COZAAR) 50 MG tablet; Take 1 tablet (50 mg total) by mouth daily.  Dispense: 30 tablet; Refill: 3  2. Acute midline low back pain without sciatica Check ua and xray  - DG Lumbar Spine Complete; Future - Urinalysis, Routine w reflex microscopic   Follow Up Instructions:    I discussed the assessment and treatment plan with the patient. The patient was provided an opportunity to ask questions and all were answered. The patient agreed with the plan and demonstrated an understanding of the instructions.   The patient was advised to call back or seek an in-person evaluation if the symptoms worsen or if the condition fails to improve as anticipated.  I provided 20 minutes of non-face-to-face time during this encounter.   [name], DO

## 2018-08-14 ENCOUNTER — Other Ambulatory Visit: Payer: Self-pay

## 2018-08-14 DIAGNOSIS — R350 Frequency of micturition: Secondary | ICD-10-CM

## 2018-08-15 ENCOUNTER — Other Ambulatory Visit: Payer: 59

## 2018-08-15 DIAGNOSIS — R350 Frequency of micturition: Secondary | ICD-10-CM

## 2018-08-16 LAB — URINE CULTURE
MICRO NUMBER:: 720476
SPECIMEN QUALITY:: ADEQUATE

## 2018-08-28 ENCOUNTER — Other Ambulatory Visit: Payer: 59

## 2018-10-05 ENCOUNTER — Other Ambulatory Visit: Payer: Self-pay

## 2018-10-05 DIAGNOSIS — Z20822 Contact with and (suspected) exposure to covid-19: Secondary | ICD-10-CM

## 2018-10-06 LAB — NOVEL CORONAVIRUS, NAA: SARS-CoV-2, NAA: NOT DETECTED

## 2018-10-08 ENCOUNTER — Telehealth: Payer: Self-pay | Admitting: Hematology

## 2018-10-08 NOTE — Telephone Encounter (Signed)
PT is aware covid 19 test is negative

## 2018-11-04 ENCOUNTER — Other Ambulatory Visit: Payer: Self-pay

## 2018-11-05 ENCOUNTER — Encounter: Payer: Self-pay | Admitting: Family Medicine

## 2018-11-05 ENCOUNTER — Other Ambulatory Visit: Payer: Self-pay

## 2018-11-05 ENCOUNTER — Ambulatory Visit (INDEPENDENT_AMBULATORY_CARE_PROVIDER_SITE_OTHER): Payer: 59 | Admitting: Family Medicine

## 2018-11-05 VITALS — BP 128/78 | HR 78 | Temp 97.5°F | Resp 18 | Ht 65.5 in | Wt 186.2 lb

## 2018-11-05 DIAGNOSIS — E1169 Type 2 diabetes mellitus with other specified complication: Secondary | ICD-10-CM | POA: Diagnosis not present

## 2018-11-05 DIAGNOSIS — E785 Hyperlipidemia, unspecified: Secondary | ICD-10-CM

## 2018-11-05 DIAGNOSIS — E1165 Type 2 diabetes mellitus with hyperglycemia: Secondary | ICD-10-CM | POA: Diagnosis not present

## 2018-11-05 DIAGNOSIS — G4700 Insomnia, unspecified: Secondary | ICD-10-CM

## 2018-11-05 DIAGNOSIS — I10 Essential (primary) hypertension: Secondary | ICD-10-CM

## 2018-11-05 DIAGNOSIS — J301 Allergic rhinitis due to pollen: Secondary | ICD-10-CM

## 2018-11-05 DIAGNOSIS — R5383 Other fatigue: Secondary | ICD-10-CM | POA: Diagnosis not present

## 2018-11-05 LAB — CBC WITH DIFFERENTIAL/PLATELET
Basophils Absolute: 0.1 10*3/uL (ref 0.0–0.1)
Basophils Relative: 1 % (ref 0.0–3.0)
Eosinophils Absolute: 0.1 10*3/uL (ref 0.0–0.7)
Eosinophils Relative: 0.8 % (ref 0.0–5.0)
HCT: 36.6 % (ref 36.0–46.0)
Hemoglobin: 11.6 g/dL — ABNORMAL LOW (ref 12.0–15.0)
Lymphocytes Relative: 28.7 % (ref 12.0–46.0)
Lymphs Abs: 2.7 10*3/uL (ref 0.7–4.0)
MCHC: 31.6 g/dL (ref 30.0–36.0)
MCV: 67.8 fl — ABNORMAL LOW (ref 78.0–100.0)
Monocytes Absolute: 0.7 10*3/uL (ref 0.1–1.0)
Monocytes Relative: 7.5 % (ref 3.0–12.0)
Neutro Abs: 5.8 10*3/uL (ref 1.4–7.7)
Neutrophils Relative %: 62 % (ref 43.0–77.0)
Platelets: 369 10*3/uL (ref 150.0–400.0)
RBC: 5.4 Mil/uL — ABNORMAL HIGH (ref 3.87–5.11)
RDW: 16 % — ABNORMAL HIGH (ref 11.5–15.5)
WBC: 9.3 10*3/uL (ref 4.0–10.5)

## 2018-11-05 LAB — COMPREHENSIVE METABOLIC PANEL
ALT: 29 U/L (ref 0–35)
AST: 23 U/L (ref 0–37)
Albumin: 4.2 g/dL (ref 3.5–5.2)
Alkaline Phosphatase: 65 U/L (ref 39–117)
BUN: 11 mg/dL (ref 6–23)
CO2: 27 mEq/L (ref 19–32)
Calcium: 9.3 mg/dL (ref 8.4–10.5)
Chloride: 102 mEq/L (ref 96–112)
Creatinine, Ser: 0.74 mg/dL (ref 0.40–1.20)
GFR: 95.12 mL/min (ref >60.00)
Glucose, Bld: 125 mg/dL — ABNORMAL HIGH (ref 70–99)
Potassium: 4.1 mEq/L (ref 3.5–5.1)
Sodium: 139 mEq/L (ref 135–145)
Total Bilirubin: 0.8 mg/dL (ref 0.2–1.2)
Total Protein: 7.4 g/dL (ref 6.0–8.3)

## 2018-11-05 LAB — LIPID PANEL
Cholesterol: 153 mg/dL (ref 0–200)
HDL: 50.8 mg/dL (ref >39.00)
LDL Cholesterol: 72 mg/dL (ref 0–99)
NonHDL: 101.79
Total CHOL/HDL Ratio: 3
Triglycerides: 149 mg/dL (ref 0.0–149.0)
VLDL: 29.8 mg/dL (ref 0.0–40.0)

## 2018-11-05 LAB — VITAMIN B12: Vitamin B-12: 350 pg/mL (ref 211–911)

## 2018-11-05 LAB — POC URINALSYSI DIPSTICK (AUTOMATED)
Bilirubin, UA: NEGATIVE
Glucose, UA: NEGATIVE
Ketones, UA: NEGATIVE
Leukocytes, UA: NEGATIVE
Nitrite, UA: NEGATIVE
Protein, UA: NEGATIVE
Spec Grav, UA: 1.015 (ref 1.010–1.025)
Urobilinogen, UA: 0.2 E.U./dL
pH, UA: 6 (ref 5.0–8.0)

## 2018-11-05 LAB — HEMOGLOBIN A1C: Hgb A1c MFr Bld: 7.4 % — ABNORMAL HIGH (ref 4.6–6.5)

## 2018-11-05 LAB — MICROALBUMIN / CREATININE URINE RATIO
Creatinine,U: 75.8 mg/dL
Microalb Creat Ratio: 4.1 mg/g (ref 0.0–30.0)
Microalb, Ur: 3.1 mg/dL — ABNORMAL HIGH (ref 0.0–1.9)

## 2018-11-05 LAB — VITAMIN D 25 HYDROXY (VIT D DEFICIENCY, FRACTURES): VITD: 44.1 ng/mL (ref 30.00–100.00)

## 2018-11-05 LAB — TSH: TSH: 0.75 u[IU]/mL (ref 0.35–4.50)

## 2018-11-05 MED ORDER — TRAZODONE HCL 50 MG PO TABS: 25.0000 mg | ORAL_TABLET | Freq: Every evening | ORAL | 3 refills | Status: DC | PRN

## 2018-11-05 NOTE — Assessment & Plan Note (Signed)
Due to inc stress  Pt feels this is temporary and will improve once she is retired trazadone 50 mg 1/2-1 tab po qhs ---prn----try to not take more than 3-4 x a week F/u prn

## 2018-11-05 NOTE — Assessment & Plan Note (Signed)
Check labs hgba1c to be checked.  minimize simple carbs. Increase exercise as tolerated. Continue current meds  

## 2018-11-05 NOTE — Assessment & Plan Note (Signed)
Well controlled, no changes to meds. Encouraged heart healthy diet such as the DASH diet and exercise as tolerated. 0---  Running high at home ? If its her bp cuff She will change the batteries and see if it makes a difference --- also suggested she bring it in so we can check it

## 2018-11-05 NOTE — Patient Instructions (Signed)

## 2018-11-05 NOTE — Addendum Note (Signed)
Addended by: Sanda Linger on: 11/05/2018 11:47 AM   Modules accepted: Orders

## 2018-11-05 NOTE — Progress Notes (Signed)
Patient ID: Megan [name], female    DOB: 08/01/1955  Age: 63 y.o. MRN: [id number]    Subjective:  Subjective  HPI Megan [name] presents for f/u dm , chol and bp.  She also c/o insomnia-- she is under a lot of stress trying to get ready for retirement and feeling lonely due to covid.  No other complaints  HYPERTENSION   Blood pressure range-140-160/90s  Chest pain- no      Dyspnea- no Lightheadedness- no   Edema- no  Other side effects - no   Medication compliance: good  Low salt diet- yes    DIABETES    Blood Sugar ranges-120-170  Polyuria- no New Visual problems- no  Hypoglycemic symptoms- no  Other side effects-no Medication compliance - good  Last eye exam- due Foot exam- today   HYPERLIPIDEMIA  Medication compliance- good RUQ pain- no  Muscle aches- no Other side effects-no      Review of Systems  Constitutional: Positive for fatigue. Negative for appetite change, diaphoresis and unexpected weight change.  Eyes: Negative for pain, redness and visual disturbance.  Respiratory: Negative for cough, chest tightness, shortness of breath and wheezing.   Cardiovascular: Negative for chest pain, palpitations and leg swelling.  Endocrine: Negative for cold intolerance, heat intolerance, polydipsia, polyphagia and polyuria.  Genitourinary: Negative for difficulty urinating, dysuria and frequency.  Neurological: Negative for dizziness, light-headedness, numbness and headaches.    History Past Medical History:  Diagnosis Date  . Allergy   . Arthritis    fingers  . Colon polyp   . Diabetes mellitus   . Diverticulosis   . GERD (gastroesophageal reflux disease)    occasionally  . H/O hiatal hernia    ?  Marland Kitchen Hemorrhoids   . Hyperlipidemia    takes fish oil  . Hypertension   . Infectious colitis   . Ventral hernia     She has a past surgical history that includes Inguinal hernia repair; Hemorrhoid surgery; Ventral hernia repair; Foot surgery (Bilateral);  colonscopy; Diagnostic laparoscopy; Incisional hernia repair (01/20/2011); and Hernia repair (01/20/2011).   Her family history includes Breast cancer in her mother and sister; Dementia in her mother; Diabetes in her brother and mother; Heart disease in her father; Liver cancer in her mother.She reports that she quit smoking about 22 years ago. Her smoking use included cigarettes. She has a 20.00 pack-year smoking history. She has never used smokeless tobacco. She reports previous alcohol use of about 4.0 standard drinks of alcohol per week. She reports that she does not use drugs.  Current Outpatient Medications on File Prior to Visit  Medication Sig Dispense Refill  . amLODipine (NORVASC) 5 MG tablet TAKE 1 TABLET BY MOUTH EVERY DAY 90 tablet 1  . Cyanocobalamin (VITAMIN B 12 PO) Take 1 tablet by mouth daily.    . Fish Oil-Cholecalciferol (FISH OIL + D3 PO) Take 1 capsule by mouth daily.    . fluticasone (FLONASE) 50 MCG/ACT nasal spray Place 2 sprays into both nostrils daily. 16 g 6  . fluticasone (FLONASE) 50 MCG/ACT nasal spray Place 2 sprays into both nostrils daily. 16 g 6  . JANUMET 50-1000 MG tablet TAKE 1 TABLET BY MOUTH TWICE DAILY WITH A MEAL 180 tablet 0  . loratadine (CLARITIN) 10 MG tablet Take 1 tablet (10 mg total) by mouth daily. 30 tablet 11  . losartan (COZAAR) 100 MG tablet TK 1 T PO D    . montelukast (SINGULAIR) 10 MG tablet Take 1 tablet (  10 mg total) by mouth at bedtime. 30 tablet 3  . Multiple Vitamin (MULTIVITAMIN) tablet Take 1 tablet by mouth daily. OVER 50 VITAMIN    . repaglinide (PRANDIN) 0.5 MG tablet Take 1 tablet (0.5 mg total) by mouth 3 (three) times daily before meals. 90 tablet 0  . losartan (COZAAR) 50 MG tablet Take 1 tablet (50 mg total) by mouth daily. (Patient not taking: Reported on 11/05/2018) 30 tablet 3   No current facility-administered medications on file prior to visit.      Objective:  Objective  Physical Exam Vitals signs and nursing note  reviewed.  Constitutional:      Appearance: She is well-developed.  HENT:     Head: Normocephalic and atraumatic.  Eyes:     Conjunctiva/sclera: Conjunctivae normal.  Neck:     Musculoskeletal: Normal range of motion and neck supple.     Thyroid: No thyromegaly.     Vascular: No carotid bruit or JVD.  Cardiovascular:     Rate and Rhythm: Normal rate and regular rhythm.     Heart sounds: Normal heart sounds. No murmur.  Pulmonary:     Effort: Pulmonary effort is normal. No respiratory distress.     Breath sounds: Normal breath sounds. No wheezing or rales.  Chest:     Chest wall: No tenderness.  Neurological:     Mental Status: She is alert and oriented to person, place, and time.    Diabetic Foot Exam - Simple   Simple Foot Form Diabetic Foot exam was performed with the following findings: Yes 11/05/2018  9:29 AM  Visual Inspection No deformities, no ulcerations, no other skin breakdown bilaterally: Yes Sensation Testing Intact to touch and monofilament testing bilaterally: Yes Pulse Check Posterior Tibialis and Dorsalis pulse intact bilaterally: Yes Comments     BP 128/78 (BP Location: Left Arm, Patient Position: Sitting, Cuff Size: Normal)   Pulse 78   Temp (!) 97.5 F (36.4 C) (Temporal)   Resp 18   Ht 5' 5.5" (1.664 m)   Wt 186 lb 3.2 oz (84.5 kg)   SpO2 98%   BMI 30.51 kg/m  Wt Readings from Last 3 Encounters:  11/05/18 186 lb 3.2 oz (84.5 kg)  08/09/18 190 lb (86.2 kg)  11/19/17 191 lb (86.6 kg)     Lab Results  Component Value Date   WBC 8.9 04/30/2017   HGB 14.3 08/09/2018   HCT 42.0 08/09/2018   PLT 431.0 (H) 04/30/2017   GLUCOSE 195 (H) 08/09/2018   CHOL 138 03/07/2018   TRIG 111.0 03/07/2018   HDL 47.70 03/07/2018   LDLDIRECT 88.0 04/21/2014   LDLCALC 68 03/07/2018   ALT 29 03/07/2018   AST 22 03/07/2018   NA 140 08/09/2018   K 3.4 (L) 08/09/2018   CL 105 08/09/2018   CREATININE 0.70 08/09/2018   BUN 7 (L) 08/09/2018   CO2 28  03/07/2018   TSH 0.76 04/30/2017   HGBA1C 6.9 (H) 03/07/2018   MICROALBUR 64.8 (H) 11/02/2014    Dg Lumbar Spine Complete  Result Date: 08/12/2018 CLINICAL DATA:  Low back pain with flexion for 3 weeks. No known injury. EXAM: LUMBAR SPINE - COMPLETE 4+ VIEW COMPARISON:  CT abdomen and pelvis 01/03/2016. FINDINGS: There is straightening of the normal lumbar lordosis. Vertebral body height and alignment are maintained. Degenerative endplate sclerosis anteriorly at L3-4 is unchanged. Intervertebral disc space height is maintained. Paraspinous structures demonstrate atherosclerosis. IMPRESSION: No acute abnormality. Degenerative endplate sclerosis at the anterior aspect of L3-4  is unchanged since 2017. Electronically Signed   By: Inge Rise M.D.   On: 08/12/2018 15:54     Assessment & Plan:  Plan  I have discontinued Claria Dice. O[place]go's fluconazole. I am also having her start on traZODone. Additionally, I am having her maintain her multivitamin, Fish Oil-Cholecalciferol (FISH OIL + D3 PO), Cyanocobalamin (VITAMIN B 12 PO), montelukast, repaglinide, fluticasone, amLODipine, Janumet, loratadine, fluticasone, losartan, and losartan.  Meds ordered this encounter  Medications  . traZODone (DESYREL) 50 MG tablet    Sig: Take 0.5-1 tablets (25-50 mg total) by mouth at bedtime as needed for sleep.    Dispense:  30 tablet    Refill:  3    Problem List Items Addressed This Visit      Unprioritized   Allergic rhinitis    Stable con't meds      Essential hypertension    Well controlled, no changes to meds. Encouraged heart healthy diet such as the DASH diet and exercise as tolerated. 0---  Running high at home ? If its her bp cuff She will change the batteries and see if it makes a difference --- also suggested she bring it in so we can check it       Relevant Medications   losartan (COZAAR) 100 MG tablet   Other Relevant Orders   CBC with Differential/Platelet   POCT Urinalysis  Dipstick (Automated) (Completed)   Microalbumin / creatinine urine ratio   Fatigue - Primary    Due to inc stress  Pt feels this is temporary and will improve once she is retired trazadone 50 mg 1/2-1 tab po qhs ---prn----try to not take more than 3-4 x a week F/u prn       Relevant Orders   CBC with Differential/Platelet   TSH   Vitamin B12   Vitamin D (25 hydroxy)   Hyperlipidemia    Tolerating statin, encouraged heart healthy diet, avoid trans fats, minimize simple carbs and saturated fats. Increase exercise as tolerated      Relevant Medications   losartan (COZAAR) 100 MG tablet   Uncontrolled type 2 diabetes mellitus with hyperglycemia ([place])    Check labs  hgba1c to be checked  minimize simple carbs. Increase exercise as tolerated. Continue current meds       Relevant Medications   losartan (COZAAR) 100 MG tablet   Other Relevant Orders   Hemoglobin A1c   Comprehensive metabolic panel   POCT Urinalysis Dipstick (Automated) (Completed)   Microalbumin / creatinine urine ratio    Other Visit Diagnoses    Hyperlipidemia associated with type 2 diabetes mellitus (H[facility])       Relevant Medications   losartan (COZAAR) 100 MG tablet   Other Relevant Orders   Lipid panel   Insomnia, unspecified type       Relevant Medications   traZODone (DESYREL) 50 MG tablet      Follow-up: Return if symptoms worsen or fail to improve, for as scheduled .  [name], DO

## 2018-11-05 NOTE — Assessment & Plan Note (Signed)
Tolerating statin, encouraged heart healthy diet, avoid trans fats, minimize simple carbs and saturated fats. Increase exercise as tolerated 

## 2018-11-05 NOTE — Assessment & Plan Note (Signed)
Stable con't meds 

## 2018-11-07 ENCOUNTER — Other Ambulatory Visit: Payer: Self-pay | Admitting: Family Medicine

## 2018-11-07 DIAGNOSIS — D649 Anemia, unspecified: Secondary | ICD-10-CM

## 2018-11-07 DIAGNOSIS — E11649 Type 2 diabetes mellitus with hypoglycemia without coma: Secondary | ICD-10-CM

## 2018-11-07 DIAGNOSIS — E1169 Type 2 diabetes mellitus with other specified complication: Secondary | ICD-10-CM

## 2018-11-07 DIAGNOSIS — E785 Hyperlipidemia, unspecified: Secondary | ICD-10-CM

## 2018-11-07 LAB — URINE CULTURE
MICRO NUMBER:: 1008904
Result:: NO GROWTH
SPECIMEN QUALITY:: ADEQUATE

## 2018-11-08 ENCOUNTER — Telehealth: Payer: Self-pay

## 2018-11-08 ENCOUNTER — Other Ambulatory Visit: Payer: Self-pay

## 2018-11-08 MED ORDER — REPAGLINIDE 0.5 MG PO TABS: 0.5000 mg | ORAL_TABLET | Freq: Three times a day (TID) | ORAL | 0 refills | Status: DC

## 2018-11-08 NOTE — Telephone Encounter (Signed)
Reviewed labs with patient and advised to try OTC Muncinex per Dr. Etter Sjogren

## 2018-11-08 NOTE — Telephone Encounter (Signed)
Pt states she was just seen Tuesday and declined another appt. Pt states that she had a little bit of a cough then but it is worse and kept her up all night. She doesn't want another $25 copay.   Pt also asked why she hasn't been called for lab results. Advised pt that results were recently reviewed by provider and CMA should be contacting when available.   [place], [place] AT [place] [phone] (Phone) [phone] (Fax)

## 2018-11-08 NOTE — Telephone Encounter (Signed)
Copied from [place] [id number]. Topic: General - Other >> Nov 08, 2018  7:36 AM Carolyn Stare wrote: Pt call to ask if Dr Etter Sjogren will call her in some something for a cough, she said she was up all night coughing  [place]

## 2018-11-22 ENCOUNTER — Other Ambulatory Visit: Payer: Self-pay | Admitting: *Deleted

## 2018-11-22 MED ORDER — AMLODIPINE BESYLATE 5 MG PO TABS: 5.0000 mg | ORAL_TABLET | Freq: Every day | ORAL | 1 refills | Status: DC

## 2018-12-10 ENCOUNTER — Other Ambulatory Visit: Payer: Self-pay

## 2018-12-10 ENCOUNTER — Ambulatory Visit (HOSPITAL_COMMUNITY)
Admission: EM | Admit: 2018-12-10 | Discharge: 2018-12-10 | Disposition: A | Discharge status: Home / Self Care | Payer: 59 | Attending: Family Medicine | Admitting: Family Medicine

## 2018-12-10 ENCOUNTER — Encounter (HOSPITAL_COMMUNITY): Payer: Self-pay | Admitting: Emergency Medicine

## 2018-12-10 DIAGNOSIS — Z9189 Other specified personal risk factors, not elsewhere classified: Secondary | ICD-10-CM | POA: Insufficient documentation

## 2018-12-10 DIAGNOSIS — Z20822 Contact with and (suspected) exposure to covid-19: Secondary | ICD-10-CM

## 2018-12-10 DIAGNOSIS — Z20828 Contact with and (suspected) exposure to other viral communicable diseases: Secondary | ICD-10-CM | POA: Diagnosis not present

## 2018-12-10 NOTE — ED Provider Notes (Signed)
[facility]    CSN: [id number] Arrival date & time: 12/10/18  1035      History   Chief Complaint Chief Complaint  Patient presents with  . Labs Only    HPI Megan [name] is a 63 y.o. female.   HPI  Patient works for the [employer] [place] [employer].  She states that she was in an office with a person on the left work on 12/06/2018 because her husband developed Covid.  She was with the coworker just prior to that.  It has been 5 days.  She is asymptomatic.  She would like to have testing to make sure that she is not infected.  She has multiple medical problems and is concerned about exposure.  Past Medical History:  Diagnosis Date  . Allergy   . Arthritis    fingers  . Colon polyp   . Diabetes mellitus   . Diverticulosis   . GERD (gastroesophageal reflux disease)    occasionally  . H/O hiatal hernia    ?  Marland Kitchen Hemorrhoids   . Hyperlipidemia    takes fish oil  . Hypertension   . Infectious colitis   . Ventral hernia     Patient Active Problem List   Diagnosis Date Noted  . Uncontrolled type 2 diabetes mellitus with hyperglycemia ([place]) 11/05/2018  . Fatigue 11/05/2018  . Acute non-recurrent pansinusitis 04/25/2018  . Bronchitis 05/21/2017  . CAP (community acquired pneumonia) 10/15/2013  . Wheezing 09/29/2013  . Obesity (BMI 30-39.9) 07/01/2013  . Pain, knee 10/02/2012  . Gastrocnemius muscle strain 10/02/2012  . Wrist sprain 10/02/2012  . Thyroid nodule 05/03/2012  . Rash and nonspecific skin eruption 03/14/2012  . HAIR LOSS 03/28/2010  . COLONIC POLYPS, HX OF 11/09/2009  . HEMANGIOMA, HEPATIC 11/04/2009  . UNSPECIFIED ANEMIA 11/04/2009  . DIVERTICULOSIS, COLON 11/04/2009  . FATTY LIVER DISEASE 11/04/2009  . PALPITATIONS 09/06/2009  . Chronic fatigue 08/07/2008  . Allergic rhinitis 04/10/2007  . Diabetes mellitus type II, controlled ([place]) 12/21/2006  . Hyperlipidemia 04/12/2006  . Essential hypertension 04/12/2006    Past Surgical History:   Procedure Laterality Date  . colonscopy    . DIAGNOSTIC LAPAROSCOPY    . FOOT SURGERY Bilateral    hammer toes  . HEMORRHOID SURGERY    . HERNIA REPAIR  01/20/2011   incisional  . INCISIONAL HERNIA REPAIR  01/20/2011   Procedure: LAPAROSCOPIC INCISIONAL HERNIA;  Surgeon: Judieth Keens, DO;  Location: [facility];  Service: General;  Laterality: N/A;  Laparoscopic repair of a recurrent incisional hernia with Mesh  . INGUINAL HERNIA REPAIR     x's 3  . VENTRAL HERNIA REPAIR      OB History   No obstetric history on file.      Home Medications    Prior to Admission medications   Medication Sig Start Date End Date Taking? Authorizing Provider  amLODipine (NORVASC) 5 MG tablet Take 1 tablet (5 mg total) by mouth daily. 11/22/18   [name], DO  Cyanocobalamin (VITAMIN B 12 PO) Take 1 tablet by mouth daily.    [provider]  Fish Oil-Cholecalciferol (FISH OIL + D3 PO) Take 1 capsule by mouth daily.    [provider]  fluticasone (FLONASE) 50 MCG/ACT nasal spray Place 2 sprays into both nostrils daily. 04/25/18   Roma Schanz R, DO  fluticasone (FLONASE) 50 MCG/ACT nasal spray Place 2 sprays into both nostrils daily. 08/09/18   [name], DO  JANUMET 50-1000 MG tablet TAKE 1 TABLET BY MOUTH TWICE DAILY WITH A MEAL 06/20/18   Debbrah Alar, NP  loratadine (CLARITIN) 10 MG tablet Take 1 tablet (10 mg total) by mouth daily. 08/09/18   [name], DO  losartan (COZAAR) 100 MG tablet TK 1 T PO D 10/18/18   [provider]  montelukast (SINGULAIR) 10 MG tablet Take 1 tablet (10 mg total) by mouth at bedtime. 04/30/17   [name], DO  Multiple Vitamin (MULTIVITAMIN) tablet Take 1 tablet by mouth daily. OVER 50 VITAMIN    [provider]  repaglinide (PRANDIN) 0.5 MG tablet Take 1 tablet (0.5 mg total) by mouth 3 (three) times daily before meals. 11/08/18   [name], DO  traZODone (DESYREL) 50  MG tablet Take 0.5-1 tablets (25-50 mg total) by mouth at bedtime as needed for sleep. 11/05/18   [name], DO    Family History Family History  Problem Relation Age of Onset  . Breast cancer Mother   . Diabetes Mother   . Dementia Mother   . Liver cancer Mother   . Heart disease Father        heart attack  . Diabetes Brother   . Breast cancer Sister   . Colon cancer Neg Hx   . Stomach cancer Neg Hx     Social History Social History   Tobacco Use  . Smoking status: Former Smoker    Packs/day: 1.00    Years: 20.00    Pack years: 20.00    Types: Cigarettes    Quit date: 01/05/1996    Years since quitting: 22.9  . Smokeless tobacco: Never Used  Substance Use Topics  . Alcohol use: Not Currently    Alcohol/week: 4.0 standard drinks    Types: 4 Glasses of wine per week    Frequency: Never  . Drug use: No     Allergies   Aspirin   Review of Systems Review of Systems  Constitutional: Negative.  Negative for chills and fever.  HENT: Negative for ear pain and sore throat.   Eyes: Negative for pain and visual disturbance.  Respiratory: Negative for cough and shortness of breath.   Cardiovascular: Negative for chest pain and palpitations.  Gastrointestinal: Negative for abdominal pain and vomiting.  Genitourinary: Negative for dysuria and hematuria.  Musculoskeletal: Negative for arthralgias, back pain and myalgias.  Skin: Negative for color change and rash.  Neurological: Negative for seizures, syncope and headaches.  All other systems reviewed and are negative.    Physical Exam Triage Vital Signs ED Triage Vitals  Enc Vitals Group     BP 12/10/18 1118 134/84     Pulse Rate 12/10/18 1118 82     Resp --      Temp 12/10/18 1118 98.4 F (36.9 C)     Temp Source 12/10/18 1118 Oral     SpO2 12/10/18 1118 98 %     Weight --      Height --      Head Circumference --      Peak Flow --      Pain Score 12/10/18 1114 0     Pain Loc --      Pain Edu?  --      Excl. in [place]? --    No data found.  Updated Vital Signs BP 134/84 (BP Location: Right Arm)   Pulse 82   Temp 98.4 F (36.9 C) (Oral)  SpO2 98%      Physical Exam Vitals signs reviewed.  Constitutional:      General: She is not in acute distress.    Appearance: She is well-developed.     Comments: Patient appears well.  Mask in place.  HENT:     Head: Normocephalic and atraumatic.  Eyes:     Conjunctiva/sclera: Conjunctivae normal.     Pupils: Pupils are equal, round, and reactive to light.  Neck:     Musculoskeletal: Normal range of motion.  Cardiovascular:     Rate and Rhythm: Normal rate.  Pulmonary:     Effort: Pulmonary effort is normal. No respiratory distress.  Abdominal:     General: There is no distension.     Palpations: Abdomen is soft.  Musculoskeletal: Normal range of motion.  Skin:    General: Skin is warm and dry.  Neurological:     Mental Status: She is alert.  Psychiatric:        Mood and Affect: Mood normal.        Behavior: Behavior normal.      UC Treatments / Results  Labs (all labs ordered are listed, but only abnormal results are displayed) Labs Reviewed  NOVEL CORONAVIRUS, NAA (HOSP ORDER, SEND-OUT TO REF LAB; TAT 18-24 HRS)    EKG   Radiology No results found.  Procedures Procedures (including critical care time)  Medications Ordered in UC Medications - No data to display  Initial Impression / Assessment and Plan / UC Course  I have reviewed the triage vital signs and the nursing notes.  Pertinent labs & imaging results that were available during my care of the patient were reviewed by me and considered in my medical decision making (see chart for details).     I reviewed coronavirus symptoms.  Testing.  Obtain results.  Treatment if positive.  Quarantine Final Clinical Impressions(s) / UC Diagnoses   Final diagnoses:  At increased risk of exposure to COVID-19 virus  Encounter for laboratory testing for  COVID-19 virus     Discharge Instructions     Quarantine at home until your test is available Tylenol for pain or fever Call for problems   ED Prescriptions    None     PDMP not reviewed this encounter.   Raylene Everts, MD 12/10/18 1141

## 2018-12-10 NOTE — ED Triage Notes (Signed)
12/06/2018 was in an office with a person that has been out of work since that time and it is known now that the other person is negative, but the spouse is positive.    The patient has no symptoms

## 2018-12-10 NOTE — Discharge Instructions (Addendum)
Quarantine at home until your test is available Tylenol for pain or fever Call for problems

## 2018-12-12 LAB — NOVEL CORONAVIRUS, NAA (HOSP ORDER, SEND-OUT TO REF LAB; TAT 18-24 HRS): SARS-CoV-2, NAA: NOT DETECTED

## 2018-12-25 ENCOUNTER — Other Ambulatory Visit: Payer: Self-pay | Admitting: *Deleted

## 2018-12-25 DIAGNOSIS — IMO0002 Reserved for concepts with insufficient information to code with codable children: Secondary | ICD-10-CM

## 2018-12-25 DIAGNOSIS — E1151 Type 2 diabetes mellitus with diabetic peripheral angiopathy without gangrene: Secondary | ICD-10-CM

## 2018-12-25 DIAGNOSIS — E1165 Type 2 diabetes mellitus with hyperglycemia: Secondary | ICD-10-CM

## 2018-12-25 MED ORDER — JANUMET 50-1000 MG PO TABS: ORAL_TABLET | ORAL | 0 refills | Status: DC

## 2018-12-25 NOTE — Telephone Encounter (Signed)
Received request from [facility] for Janumet 50/1000mg  twice a day. Refill sent.

## 2018-12-31 ENCOUNTER — Other Ambulatory Visit: Payer: Self-pay

## 2019-01-01 ENCOUNTER — Encounter: Payer: 59 | Admitting: Family Medicine

## 2019-01-03 ENCOUNTER — Ambulatory Visit (INDEPENDENT_AMBULATORY_CARE_PROVIDER_SITE_OTHER): Payer: 59 | Admitting: Family Medicine

## 2019-01-03 ENCOUNTER — Other Ambulatory Visit: Payer: Self-pay

## 2019-01-03 ENCOUNTER — Encounter: Payer: Self-pay | Admitting: Family Medicine

## 2019-01-03 VITALS — BP 120/78 | HR 87 | Temp 97.2°F | Resp 18 | Ht 65.5 in | Wt 189.2 lb

## 2019-01-03 DIAGNOSIS — E041 Nontoxic single thyroid nodule: Secondary | ICD-10-CM

## 2019-01-03 DIAGNOSIS — E118 Type 2 diabetes mellitus with unspecified complications: Secondary | ICD-10-CM

## 2019-01-03 DIAGNOSIS — E785 Hyperlipidemia, unspecified: Secondary | ICD-10-CM | POA: Diagnosis not present

## 2019-01-03 DIAGNOSIS — Z23 Encounter for immunization: Secondary | ICD-10-CM | POA: Diagnosis not present

## 2019-01-03 DIAGNOSIS — E669 Obesity, unspecified: Secondary | ICD-10-CM

## 2019-01-03 DIAGNOSIS — I10 Essential (primary) hypertension: Secondary | ICD-10-CM

## 2019-01-03 DIAGNOSIS — Z0000 Encounter for general adult medical examination without abnormal findings: Secondary | ICD-10-CM | POA: Diagnosis not present

## 2019-01-03 NOTE — Patient Instructions (Signed)
Preventive Care 38 Years and Older, Female Preventive care refers to lifestyle choices and visits with your health care provider that can promote health and wellness. This includes:  A yearly physical exam. This is also called an annual well check.  Regular dental and eye exams.  Immunizations.  Screening for certain conditions.  Healthy lifestyle choices, such as diet and exercise. What can I expect for my preventive care visit? Physical exam Your health care provider will check:  Height and weight. These may be used to calculate body mass index (BMI), which is a measurement that tells if you are at a healthy weight.  Heart rate and blood pressure.  Your skin for abnormal spots. Counseling Your health care provider may ask you questions about:  Alcohol, tobacco, and drug use.  Emotional well-being.  Home and relationship well-being.  Sexual activity.  Eating habits.  History of falls.  Memory and ability to understand (cognition).  Work and work Statistician.  Pregnancy and menstrual history. What immunizations do I need?  Influenza (flu) vaccine  This is recommended every year. Tetanus, diphtheria, and pertussis (Tdap) vaccine  You may need a Td booster every 10 years. Varicella (chickenpox) vaccine  You may need this vaccine if you have not already been vaccinated. Zoster (shingles) vaccine  You may need this after age 65. Pneumococcal conjugate (PCV13) vaccine  One dose is recommended after age 65. Pneumococcal polysaccharide (PPSV23) vaccine  One dose is recommended after age 65. Measles, mumps, and rubella (MMR) vaccine  You may need at least one dose of MMR if you were born in 1957 or later. You may also need a second dose. Meningococcal conjugate (MenACWY) vaccine  You may need this if you have certain conditions. Hepatitis A vaccine  You may need this if you have certain conditions or if you travel or work in places where you may be exposed  to hepatitis A. Hepatitis B vaccine  You may need this if you have certain conditions or if you travel or work in places where you may be exposed to hepatitis B. Haemophilus influenzae type b (Hib) vaccine  You may need this if you have certain conditions. You may receive vaccines as individual doses or as more than one vaccine together in one shot (combination vaccines). Talk with your health care provider about the risks and benefits of combination vaccines. What tests do I need? Blood tests  Lipid and cholesterol levels. These may be checked every 5 years, or more frequently depending on your overall health.  Hepatitis C test.  Hepatitis B test. Screening  Lung cancer screening. You may have this screening every year starting at age 65 if you have a 30-pack-year history of smoking and currently smoke or have quit within the past 15 years.  Colorectal cancer screening. All adults should have this screening starting at age 65 and continuing until age 15. Your health care provider may recommend screening at age 65 if you are at increased risk. You will have tests every 1-10 years, depending on your results and the type of screening test.  Diabetes screening. This is done by checking your blood sugar (glucose) after you have not eaten for a while (fasting). You may have this done every 1-3 years.  Mammogram. This may be done every 1-2 years. Talk with your health care provider about how often you should have regular mammograms.  BRCA-related cancer screening. This may be done if you have a family history of breast, ovarian, tubal, or peritoneal cancers.  Other tests  Sexually transmitted disease (STD) testing.  Bone density scan. This is done to screen for osteoporosis. You may have this done starting at age 65. Follow these instructions at home: Eating and drinking  Eat a diet that includes fresh fruits and vegetables, whole grains, lean protein, and low-fat dairy products. Limit  your intake of foods with high amounts of sugar, saturated fats, and salt.  Take vitamin and mineral supplements as recommended by your health care provider.  Do not drink alcohol if your health care provider tells you not to drink.  If you drink alcohol: ? Limit how much you have to 0-1 drink a day. ? Be aware of how much alcohol is in your drink. In the [place] one drink equals one 12 oz bottle of beer (355 mL), one 5 oz glass of wine (148 mL), or one 1 oz glass of hard liquor (44 mL). Lifestyle  Take daily care of your teeth and gums.  Stay active. Exercise for at least 30 minutes on 5 or more days each week.  Do not use any products that contain nicotine or tobacco, such as cigarettes, e-cigarettes, and chewing tobacco. If you need help quitting, ask your health care provider.  If you are sexually active, practice safe sex. Use a condom or other form of protection in order to prevent STIs (sexually transmitted infections).  Talk with your health care provider about taking a low-dose aspirin or statin. What's next?  Go to your health care provider once a year for a well check visit.  Ask your health care provider how often you should have your eyes and teeth checked.  Stay up to date on all vaccines. This information is not intended to replace advice given to you by your health care provider. Make sure you discuss any questions you have with your health care provider. Document Released: 01/29/2015 Document Revised: 12/27/2017 Document Reviewed: 12/27/2017 Elsevier Patient Education  2020 [employer].

## 2019-01-03 NOTE — Progress Notes (Signed)
Subjective:     Megan [name] is a 63 y.o. female and is here for a comprehensive physical exam. The patient reports no problems.  HYPERTENSION   Blood pressure range-up and down-- looked at home readings  Chest pain- no      Dyspnea- no Lightheadedness- no   Edema- no  Other side effects - no   Medication compliance: good Low salt diet- yes    DIABETES    Blood Sugar ranges-129-180-  Polyuria- no New Visual problems- no  Hypoglycemic symptoms- no  Other side effects-no Medication compliance - good Last eye exam-next one is tomorrow Foot exam- today   HYPERLIPIDEMIA  Medication compliance- good RUQ pain- no  Muscle aches- no Other side effects-no      Social History   Socioeconomic History  . Marital status: Divorced    Spouse name: Not on file  . Number of children: 1  . Years of education: Not on file  . Highest education level: Not on file  Occupational History  . Occupation: assessment specialist    Comment: city of [place]  Tobacco Use  . Smoking status: Former Smoker    Packs/day: 1.00    Years: 20.00    Pack years: 20.00    Types: Cigarettes    Quit date: 01/05/1996    Years since quitting: 23.0  . Smokeless tobacco: Never Used  Substance and Sexual Activity  . Alcohol use: Not Currently    Alcohol/week: 4.0 standard drinks    Types: 4 Glasses of wine per week  . Drug use: No  . Sexual activity: Not on file  Other Topics Concern  . Not on file  Social History Narrative   Exercise 3 times a week   Social Determinants of Health   Financial Resource Strain:   . Difficulty of Paying Living Expenses: Not on file  Food Insecurity:   . Worried About Charity fundraiser in the Last Year: Not on file  . Ran Out of Food in the Last Year: Not on file  Transportation Needs:   . Lack of Transportation (Medical): Not on file  . Lack of Transportation (Non-Medical): Not on file  Physical Activity:   . Days of Exercise per Week: Not on file   . Minutes of Exercise per Session: Not on file  Stress:   . Feeling of Stress : Not on file  Social Connections:   . Frequency of Communication with Friends and Family: Not on file  . Frequency of Social Gatherings with Friends and Family: Not on file  . Attends Religious Services: Not on file  . Active Member of Clubs or Organizations: Not on file  . Attends Archivist Meetings: Not on file  . Marital Status: Not on file  Intimate Partner Violence:   . Fear of Current or Ex-Partner: Not on file  . Emotionally Abused: Not on file  . Physically Abused: Not on file  . Sexually Abused: Not on file   Health Maintenance  Topic Date Due  . HIV Screening  03/19/1970  . OPHTHALMOLOGY EXAM  10/03/2015  . PNA vac Low Risk Adult (1 of 2 - PCV13) 08/12/2019 (Originally 03/20/2018)  . MAMMOGRAM  02/21/2019  . HEMOGLOBIN A1C  05/06/2019  . FOOT EXAM  11/05/2019  . PAP SMEAR-Modifier  01/31/2020  . TETANUS/TDAP  05/04/2022  . COLONOSCOPY  10/26/2026  . INFLUENZA VACCINE  Completed  . DEXA SCAN  Completed  . Hepatitis C Screening  Completed  The following portions of the patient's history were reviewed and updated as appropriate: She  has a past medical history of Allergy, Arthritis, Colon polyp, Diabetes mellitus, Diverticulosis, GERD (gastroesophageal reflux disease), H/O hiatal hernia, Hemorrhoids, Hyperlipidemia, Hypertension, Infectious colitis, and Ventral hernia. She does not have any pertinent problems on file. She  has a past surgical history that includes Inguinal hernia repair; Hemorrhoid surgery; Ventral hernia repair; Foot surgery (Bilateral); colonscopy; Diagnostic laparoscopy; Incisional hernia repair (01/20/2011); and Hernia repair (01/20/2011). Her family history includes Breast cancer in her mother and sister; Dementia in her mother; Diabetes in her brother and mother; Heart disease in her father; Liver cancer in her mother. She  reports that she quit smoking about 23  years ago. Her smoking use included cigarettes. She has a 20.00 pack-year smoking history. She has never used smokeless tobacco. She reports previous alcohol use of about 4.0 standard drinks of alcohol per week. She reports that she does not use drugs. She has a current medication list which includes the following prescription(s): amlodipine, cyanocobalamin, fish oil-cholecalciferol, fluticasone, fluticasone, loratadine, losartan, montelukast, multivitamin, repaglinide, janumet, and trazodone. Current Outpatient Medications on File Prior to Visit  Medication Sig Dispense Refill  . amLODipine (NORVASC) 5 MG tablet Take 1 tablet (5 mg total) by mouth daily. 90 tablet 1  . Cyanocobalamin (VITAMIN B 12 PO) Take 1 tablet by mouth daily.    . Fish Oil-Cholecalciferol (FISH OIL + D3 PO) Take 1 capsule by mouth daily.    . fluticasone (FLONASE) 50 MCG/ACT nasal spray Place 2 sprays into both nostrils daily. 16 g 6  . fluticasone (FLONASE) 50 MCG/ACT nasal spray Place 2 sprays into both nostrils daily. 16 g 6  . loratadine (CLARITIN) 10 MG tablet Take 1 tablet (10 mg total) by mouth daily. 30 tablet 11  . losartan (COZAAR) 100 MG tablet TK 1 T PO D    . montelukast (SINGULAIR) 10 MG tablet Take 1 tablet (10 mg total) by mouth at bedtime. 30 tablet 3  . Multiple Vitamin (MULTIVITAMIN) tablet Take 1 tablet by mouth daily. OVER 50 VITAMIN    . repaglinide (PRANDIN) 0.5 MG tablet Take 1 tablet (0.5 mg total) by mouth 3 (three) times daily before meals. 90 tablet 0  . sitaGLIPtin-metformin (JANUMET) 50-1000 MG tablet TAKE 1 TABLET BY MOUTH TWICE DAILY WITH A MEAL 180 tablet 0  . traZODone (DESYREL) 50 MG tablet Take 0.5-1 tablets (25-50 mg total) by mouth at bedtime as needed for sleep. 30 tablet 3   No current facility-administered medications on file prior to visit.   She is allergic to aspirin..  Review of Systems Review of Systems  Constitutional: Negative for activity change, appetite change and  fatigue.  HENT: Negative for hearing loss, congestion, tinnitus and ear discharge.  dentist q55m Eyes: Negative for visual disturbance (see optho q1y -- vision corrected to 20/20 with glasses).  Respiratory: Negative for cough, chest tightness and shortness of breath.   Cardiovascular: Negative for chest pain, palpitations and leg swelling.  Gastrointestinal: Negative for abdominal pain, diarrhea, constipation and abdominal distention.  Genitourinary: Negative for urgency, frequency, decreased urine volume and difficulty urinating.  Musculoskeletal: Negative for back pain, arthralgias and gait problem.  Skin: Negative for color change, pallor and rash.  Neurological: Negative for dizziness, light-headedness, numbness and headaches.  Hematological: Negative for adenopathy. Does not bruise/bleed easily.  Psychiatric/Behavioral: Negative for suicidal ideas, confusion, sleep disturbance, self-injury, dysphoric mood, decreased concentration and agitation.       Objective:  BP 120/78 (BP Location: Right Arm, Patient Position: Sitting, Cuff Size: Normal)   Pulse 87   Temp (!) 97.2 F (36.2 C) (Temporal)   Resp 18   Ht 5' 5.5" (1.664 m)   Wt 189 lb 3.2 oz (85.8 kg)   SpO2 98%   BMI 31.01 kg/m  General appearance: alert, cooperative, appears stated age and no distress Head: Normocephalic, without obvious abnormality, atraumatic Eyes: negative findings: lids and lashes normal, conjunctivae and sclerae normal and pupils equal, round, reactive to light and accomodation Ears: normal TM's and external ear canals both ears Nose: Nares normal. Septum midline. Mucosa normal. No drainage or sinus tenderness. Throat: lips, mucosa, and tongue normal; teeth and gums normal Neck: no adenopathy, no carotid bruit, no JVD, supple, symmetrical, trachea midline and thyroid not enlarged, symmetric, no tenderness/mass/nodules Back: symmetric, no curvature. ROM normal. No CVA tenderness. Lungs: clear to  auscultation bilaterally Breasts: gyn Heart: regular rate and rhythm, S1, S2 normal, no murmur, click, rub or gallop Abdomen: soft, non-tender; bowel sounds normal; no masses,  no organomegaly Pelvic: deferred Extremities: extremities normal, atraumatic, no cyanosis or edema Pulses: 2+ and symmetric Skin: Skin color, texture, turgor normal. No rashes or lesions Lymph nodes: Cervical, supraclavicular, and axillary nodes normal. Neurologic: Alert and oriented X 3, normal strength and tone. Normal symmetric reflexes. Normal coordination and gait    Assessment:    Healthy female exam.      Plan:    ghm utd Check labs  See After Visit Summary for Counseling Recommendations    1. Need for pneumococcal vaccination  - PREVNAR - 13 (pneumococcal conjugate vaccine 13)  2. Controlled type 2 diabetes mellitus with complication, without long-term current use of insulin (HCC) hgba1c acceptable, minimize simple carbs. Increase exercise as tolerated. Continue current meds  Lab Results  Component Value Date   HGBA1C 7.4 (H) 11/05/2018     3. Hyperlipidemia, unspecified hyperlipidemia type Tolerating statin, encouraged heart healthy diet, avoid trans fats, minimize simple carbs and saturated fats. Increase exercise as tolerated Lab Results  Component Value Date   CHOL 153 11/05/2018   HDL 50.80 11/05/2018   LDLCALC 72 11/05/2018   LDLDIRECT 88.0 04/21/2014   TRIG 149.0 11/05/2018   CHOLHDL 3 11/05/2018    4. Essential hypertension Well controlled, no changes to meds. Encouraged heart healthy diet such as the DASH diet and exercise as tolerated.   5. Thyroid nodule Stable   6. Obesity (BMI 30-39.9) D/w pt diet and exercise  7. Preventative health care See above

## 2019-01-05 NOTE — Assessment & Plan Note (Signed)
   Tolerating statin, encouraged heart healthy diet, avoid trans fats, minimize simple carbs and saturated fats. Increase exercise as tolerated Lab Results  Component Value Date   CHOL 153 11/05/2018   HDL 50.80 11/05/2018   LDLCALC 72 11/05/2018   LDLDIRECT 88.0 04/21/2014   TRIG 149.0 11/05/2018   CHOLHDL 3 11/05/2018

## 2019-01-05 NOTE — Assessment & Plan Note (Addendum)
hgba1c acceptable, minimize simple carbs. Increase exercise as tolerated. Continue current meds  Lab Results  Component Value Date   HGBA1C 7.4 (H) 11/05/2018

## 2019-01-05 NOTE — Assessment & Plan Note (Signed)
Well controlled, no changes to meds. Encouraged heart healthy diet such as the DASH diet and exercise as tolerated.  °

## 2019-01-05 NOTE — Assessment & Plan Note (Signed)
D/w pt diet and exercise  

## 2019-01-05 NOTE — Assessment & Plan Note (Signed)
Lab Results  Component Value Date   TSH 0.75 11/05/2018

## 2019-02-25 ENCOUNTER — Other Ambulatory Visit: Payer: Self-pay

## 2019-02-25 ENCOUNTER — Encounter (HOSPITAL_COMMUNITY): Payer: Self-pay | Admitting: Emergency Medicine

## 2019-02-25 ENCOUNTER — Ambulatory Visit (HOSPITAL_COMMUNITY)
Admission: EM | Admit: 2019-02-25 | Discharge: 2019-02-25 | Disposition: A | Discharge status: Home / Self Care | Payer: Medicare Other | Attending: Family Medicine | Admitting: Family Medicine

## 2019-02-25 DIAGNOSIS — Z20822 Contact with and (suspected) exposure to covid-19: Secondary | ICD-10-CM | POA: Diagnosis present

## 2019-02-25 DIAGNOSIS — I10 Essential (primary) hypertension: Secondary | ICD-10-CM | POA: Diagnosis present

## 2019-02-25 NOTE — ED Provider Notes (Signed)
[facility]   [id number] 02/25/19 Arrival Time: [id number]  ASSESSMENT & PLAN:  1. Exposure to COVID-19 virus   2. Elevated blood pressure reading with diagnosis of hypertension      COVID-19 testing sent.   Follow-up Information    [name], DO.   Specialty: Family Medicine Why: To recheck your blood pressure. Contact information: [address] [place] [address] [phone]           Reviewed expectations re: course of current medical issues. Questions answered. Outlined signs and symptoms indicating need for more acute intervention. Understanding verbalized. After Visit Summary given.   SUBJECTIVE: History from: patient. Megan [name] is a 64 y.o. female who requests COVID-19 testing. Known COVID-19 contact: reports recent exposure. Recent travel: none. Denies: runny nose, congestion, fever, cough, sore throat, difficulty breathing and headache. Normal PO intake without n/v/d.  Increased blood pressure noted today. Reports that she is treated for HTN in the past. She reports no chest pain on exertion, no dyspnea on exertion, no swelling of ankles, no orthostatic dizziness or lightheadedness, no orthopnea or paroxysmal nocturnal dyspnea, no palpitations and no intermittent claudication symptoms.   OBJECTIVE:  Vitals:   02/25/19 1717  BP: (!) 168/74  Pulse: 96  Resp: 18  Temp: 98.6 F (37 C)  TempSrc: Oral  SpO2: 100%    General appearance: alert; no distress Eyes: PERRLA; EOMI; conjunctiva normal HENT: [place]; AT; nasal mucosa normal; oral mucosa normal Neck: supple  Lungs: speaks full sentences without difficulty; unlabored Extremities: no edema Skin: warm and dry Neurologic: normal gait Psychological: alert and cooperative; normal mood and affect  Labs:  Labs Reviewed  NOVEL CORONAVIRUS, NAA (HOSP ORDER, SEND-OUT TO REF LAB; TAT 18-24 HRS)      Allergies  Allergen Reactions  . Aspirin Nausea Only     Past Medical History:  Diagnosis Date  . Allergy   . Arthritis    fingers  . Colon polyp   . Diabetes mellitus   . Diverticulosis   . GERD (gastroesophageal reflux disease)    occasionally  . H/O hiatal hernia    ?  Marland Kitchen Hemorrhoids   . Hyperlipidemia    takes fish oil  . Hypertension   . Infectious colitis   . Ventral hernia    Social History   Socioeconomic History  . Marital status: Divorced    Spouse name: Not on file  . Number of children: 1  . Years of education: Not on file  . Highest education level: Not on file  Occupational History  . Occupation: assessment specialist    Comment: city of [place]  Tobacco Use  . Smoking status: Former Smoker    Packs/day: 1.00    Years: 20.00    Pack years: 20.00    Types: Cigarettes    Quit date: 01/05/1996    Years since quitting: 23.1  . Smokeless tobacco: Never Used  Substance and Sexual Activity  . Alcohol use: Not Currently    Alcohol/week: 4.0 standard drinks    Types: 4 Glasses of wine per week  . Drug use: No  . Sexual activity: Not on file  Other Topics Concern  . Not on file  Social History Narrative   Exercise 3 times a week   Social Determinants of Health   Financial Resource Strain:   . Difficulty of Paying Living Expenses: Not on file  Food Insecurity:   . Worried About Charity fundraiser in  the Last Year: Not on file  . Ran Out of Food in the Last Year: Not on file  Transportation Needs:   . Lack of Transportation (Medical): Not on file  . Lack of Transportation (Non-Medical): Not on file  Physical Activity:   . Days of Exercise per Week: Not on file  . Minutes of Exercise per Session: Not on file  Stress:   . Feeling of Stress : Not on file  Social Connections:   . Frequency of Communication with Friends and Family: Not on file  . Frequency of Social Gatherings with Friends and Family: Not on file  . Attends Religious Services: Not on file  . Active Member of Clubs or Organizations:  Not on file  . Attends Archivist Meetings: Not on file  . Marital Status: Not on file  Intimate Partner Violence:   . Fear of Current or Ex-Partner: Not on file  . Emotionally Abused: Not on file  . Physically Abused: Not on file  . Sexually Abused: Not on file   Family History  Problem Relation Age of Onset  . Breast cancer Mother   . Diabetes Mother   . Dementia Mother   . Liver cancer Mother   . Heart disease Father        heart attack  . Diabetes Brother   . Breast cancer Sister   . Colon cancer Neg Hx   . Stomach cancer Neg Hx    Past Surgical History:  Procedure Laterality Date  . colonscopy    . DIAGNOSTIC LAPAROSCOPY    . FOOT SURGERY Bilateral    hammer toes  . HEMORRHOID SURGERY    . HERNIA REPAIR  01/20/2011   incisional  . INCISIONAL HERNIA REPAIR  01/20/2011   Procedure: LAPAROSCOPIC INCISIONAL HERNIA;  Surgeon: Judieth Keens, DO;  Location: [facility];  Service: General;  Laterality: N/A;  Laparoscopic repair of a recurrent incisional hernia with Mesh  . INGUINAL HERNIA REPAIR     x's 3  . VENTRAL HERNIA REPAIR       Vanessa Kick, MD 02/25/19 [phone]

## 2019-02-25 NOTE — ED Triage Notes (Signed)
Pt here for covid test after possible exposure 2 weeks ago

## 2019-02-25 NOTE — Discharge Instructions (Addendum)
You have been tested for COVID-19 today. °If your test returns positive, you will receive a phone call from [facility] regarding your results. °Negative test results are not called. °Both positive and negative results area always visible on MyChart. °If you do not have a MyChart account, sign up instructions are provided in your discharge papers. °Please do not hesitate to contact us should you have questions or concerns. ° °

## 2019-02-26 ENCOUNTER — Telehealth: Payer: Self-pay | Admitting: Family Medicine

## 2019-02-26 NOTE — Telephone Encounter (Signed)
Please advise. No coupons in the office

## 2019-02-26 NOTE — Telephone Encounter (Signed)
No coupons or samples Maybe pt assistance through [place] to [email or web address] and print pt assistance form -- pt can actually fill it out on line and we can see if she can get it from the company free

## 2019-02-26 NOTE — Telephone Encounter (Signed)
Patient states that she is out of her and she has retired in the beginning of February. Patient states that she is now of Medicare and when representative signed her up the did not sign her up for Part D that covers her medications. Patient would like to know if theres anything we can do for her since her medication is $600.00 without insurance.  Patient would like samples if we have them. Please advise.  Medication needed  sitaGLIPtin-metformin (JANUMET) 50-1000 MG tablet

## 2019-02-27 LAB — NOVEL CORONAVIRUS, NAA (HOSP ORDER, SEND-OUT TO REF LAB; TAT 18-24 HRS): SARS-CoV-2, NAA: NOT DETECTED

## 2019-02-27 MED ORDER — METFORMIN HCL 1000 MG PO TABS: 1000.0000 mg | ORAL_TABLET | Freq: Two times a day (BID) | ORAL | 2 refills | Status: DC

## 2019-02-27 MED ORDER — GLIMEPIRIDE 2 MG PO TABS: 2.0000 mg | ORAL_TABLET | Freq: Every day | ORAL | 2 refills | Status: DC

## 2019-02-27 NOTE — Telephone Encounter (Signed)
Patient was provided info. She now has clinical question about other meds that she may be able to take. Or if there is anything else. Patient would like Dr. Etter Sjogren to call her back. She stated she has been a patient for over  20 years and feels like she could at least get a call.

## 2019-02-27 NOTE — Telephone Encounter (Signed)
Left detailed VM. Advised patient to try to apply for the patience assistance program for the Janumet. Also advised that we were sending in two meds. Metformin and Amaryl. Advised that the Amaryl can lower blood sugars and to let us know if she was having low blood sugars and advised to f/u in 3 months.

## 2019-03-17 ENCOUNTER — Telehealth: Payer: Self-pay

## 2019-03-17 DIAGNOSIS — E1151 Type 2 diabetes mellitus with diabetic peripheral angiopathy without gangrene: Secondary | ICD-10-CM

## 2019-03-17 DIAGNOSIS — IMO0002 Reserved for concepts with insufficient information to code with codable children: Secondary | ICD-10-CM

## 2019-03-17 MED ORDER — JANUMET 50-1000 MG PO TABS: ORAL_TABLET | ORAL | 0 refills | Status: DC

## 2019-03-17 NOTE — Telephone Encounter (Signed)
Refill sent.

## 2019-03-17 NOTE — Telephone Encounter (Signed)
The patients Pharmacy called in to see if Dr. Etter Sjogren can send over a prescription for sitaGLIPtin-metformin (JANUMET) 50-1000 MG tablet [id number]    Patient is in store waiting on prescription   Please follow up with [facility] at [place] at [phone]

## 2019-03-18 ENCOUNTER — Other Ambulatory Visit: Payer: Self-pay | Admitting: *Deleted

## 2019-03-18 DIAGNOSIS — I10 Essential (primary) hypertension: Secondary | ICD-10-CM | POA: Diagnosis not present

## 2019-03-18 DIAGNOSIS — IMO0002 Reserved for concepts with insufficient information to code with codable children: Secondary | ICD-10-CM

## 2019-03-18 DIAGNOSIS — I129 Hypertensive chronic kidney disease with stage 1 through stage 4 chronic kidney disease, or unspecified chronic kidney disease: Secondary | ICD-10-CM | POA: Diagnosis not present

## 2019-03-18 DIAGNOSIS — E1129 Type 2 diabetes mellitus with other diabetic kidney complication: Secondary | ICD-10-CM | POA: Diagnosis not present

## 2019-03-18 DIAGNOSIS — E1151 Type 2 diabetes mellitus with diabetic peripheral angiopathy without gangrene: Secondary | ICD-10-CM

## 2019-03-18 DIAGNOSIS — E1122 Type 2 diabetes mellitus with diabetic chronic kidney disease: Secondary | ICD-10-CM | POA: Diagnosis not present

## 2019-03-18 DIAGNOSIS — N181 Chronic kidney disease, stage 1: Secondary | ICD-10-CM | POA: Diagnosis not present

## 2019-03-18 MED ORDER — JANUMET 50-1000 MG PO TABS: ORAL_TABLET | ORAL | 1 refills | Status: DC

## 2019-04-02 ENCOUNTER — Telehealth: Payer: Self-pay

## 2019-04-02 MED ORDER — REPAGLINIDE 0.5 MG PO TABS: 0.5000 mg | ORAL_TABLET | Freq: Three times a day (TID) | ORAL | 3 refills | Status: DC

## 2019-04-02 NOTE — Telephone Encounter (Signed)
Patient called in to see if Dr. Etter Sjogren can send in a prescription for  repaglinide (PRANDIN) 0.5 MG tablet [id number]   Please send it to : [facility] [place], [place] [facility] [place] [address] [facility]  [address] [place] [address]  Phone:  [phone] Fax:  [phone]  DEA #:  [id number]

## 2019-04-02 NOTE — Telephone Encounter (Signed)
Refill sent, notified pt. 

## 2019-04-10 ENCOUNTER — Other Ambulatory Visit: Payer: Self-pay

## 2019-04-10 ENCOUNTER — Encounter (HOSPITAL_COMMUNITY): Payer: Self-pay

## 2019-04-10 ENCOUNTER — Ambulatory Visit (HOSPITAL_COMMUNITY)
Admission: EM | Admit: 2019-04-10 | Discharge: 2019-04-10 | Disposition: A | Discharge status: Home / Self Care | Payer: Medicare Other | Attending: Emergency Medicine | Admitting: Emergency Medicine

## 2019-04-10 DIAGNOSIS — Z833 Family history of diabetes mellitus: Secondary | ICD-10-CM | POA: Diagnosis not present

## 2019-04-10 DIAGNOSIS — E785 Hyperlipidemia, unspecified: Secondary | ICD-10-CM | POA: Diagnosis not present

## 2019-04-10 DIAGNOSIS — K219 Gastro-esophageal reflux disease without esophagitis: Secondary | ICD-10-CM | POA: Insufficient documentation

## 2019-04-10 DIAGNOSIS — Z20822 Contact with and (suspected) exposure to covid-19: Secondary | ICD-10-CM | POA: Insufficient documentation

## 2019-04-10 DIAGNOSIS — Z8249 Family history of ischemic heart disease and other diseases of the circulatory system: Secondary | ICD-10-CM | POA: Insufficient documentation

## 2019-04-10 DIAGNOSIS — R509 Fever, unspecified: Secondary | ICD-10-CM | POA: Diagnosis not present

## 2019-04-10 DIAGNOSIS — Z800 Family history of malignant neoplasm of digestive organs: Secondary | ICD-10-CM | POA: Insufficient documentation

## 2019-04-10 DIAGNOSIS — Z886 Allergy status to analgesic agent status: Secondary | ICD-10-CM | POA: Insufficient documentation

## 2019-04-10 DIAGNOSIS — Z87891 Personal history of nicotine dependence: Secondary | ICD-10-CM | POA: Insufficient documentation

## 2019-04-10 DIAGNOSIS — E119 Type 2 diabetes mellitus without complications: Secondary | ICD-10-CM | POA: Diagnosis not present

## 2019-04-10 DIAGNOSIS — I10 Essential (primary) hypertension: Secondary | ICD-10-CM | POA: Diagnosis not present

## 2019-04-10 DIAGNOSIS — Z7984 Long term (current) use of oral hypoglycemic drugs: Secondary | ICD-10-CM | POA: Insufficient documentation

## 2019-04-10 DIAGNOSIS — Z79899 Other long term (current) drug therapy: Secondary | ICD-10-CM | POA: Diagnosis not present

## 2019-04-10 DIAGNOSIS — Z803 Family history of malignant neoplasm of breast: Secondary | ICD-10-CM | POA: Insufficient documentation

## 2019-04-10 NOTE — ED Triage Notes (Signed)
Pt c/o fever onset today with Tmax of 100. Also c/o sinus congestion, cough for a couple days; but states she has been working outside lately and taking zyrtec.  Also states she received her first dose COVID injection on 03/27/19. Pt appears anxious, speaking rapidly, stating, "I was worried about the fever because it's one of the symptoms of COVID".  Denies abdom pain, n/v/d, or dysuria sx.

## 2019-04-10 NOTE — Discharge Instructions (Signed)
Tylenol and Ibuprofen for fever Monitor MyChart for COVID results Continue zyrtec, add in flonase if needed Continue blood pressure medicine as prescribed and monitor Please follow up with your primary care doctor to recheck blood pressure and discuss any need for medication changes.   Please go to Emergency Room if you start to experience severe headache, vision changes, decreased urine production, chest pain, shortness of breath, speech slurring, one sided weakness.

## 2019-04-11 LAB — SARS CORONAVIRUS 2 (TAT 6-24 HRS): SARS Coronavirus 2: NEGATIVE

## 2019-04-11 NOTE — ED Provider Notes (Signed)
[facility]    CSN: [id number] Arrival date & time: 04/10/19  1958      History   Chief Complaint Chief Complaint  Patient presents with  . Fever  . Hypertension    HPI Megan [name] is a 64 y.o. female history of GERD, DM type II, hypertension, presenting today for evaluation of a fever and elevated blood pressure.  Patient notes that she has had some sinus congestion and cough that have been mild for a couple of days.  She notes that this can be normal for her.  She has been taking daily Zyrtec for this.  She got concerned as she began to feel really warm this afternoon and noted a temperature of 100.  She denies any chest pain or shortness of breath.  Denies any abdominal pain nausea vomiting or diarrhea.  Denies any urinary symptoms of dysuria, or increased frequency urgency or hematuria.  She received her for the first Covid vaccine injection on 3/11 without any immediate complications.  Denies any close sick contacts or known exposure to Covid.  She also noted that her blood pressure has been elevated today as well at 160/100.  Takes amlodipine and losartan for blood pressure.  Checks blood pressure regularly.  Denies headaches vision changes, difficulty speaking or weakness.  HPI  Past Medical History:  Diagnosis Date  . Allergy   . Arthritis    fingers  . Colon polyp   . Diabetes mellitus   . Diverticulosis   . GERD (gastroesophageal reflux disease)    occasionally  . H/O hiatal hernia    ?  Marland Kitchen Hemorrhoids   . Hyperlipidemia    takes fish oil  . Hypertension   . Infectious colitis   . Ventral hernia     Patient Active Problem List   Diagnosis Date Noted  . Uncontrolled type 2 diabetes mellitus with hyperglycemia ([place]) 11/05/2018  . Fatigue 11/05/2018  . Acute non-recurrent pansinusitis 04/25/2018  . Bronchitis 05/21/2017  . CAP (community acquired pneumonia) 10/15/2013  . Wheezing 09/29/2013  . Obesity (BMI 30-39.9) 07/01/2013  . Pain, knee  10/02/2012  . Gastrocnemius muscle strain 10/02/2012  . Wrist sprain 10/02/2012  . Thyroid nodule 05/03/2012  . Rash and nonspecific skin eruption 03/14/2012  . HAIR LOSS 03/28/2010  . COLONIC POLYPS, HX OF 11/09/2009  . HEMANGIOMA, HEPATIC 11/04/2009  . UNSPECIFIED ANEMIA 11/04/2009  . DIVERTICULOSIS, COLON 11/04/2009  . FATTY LIVER DISEASE 11/04/2009  . PALPITATIONS 09/06/2009  . Chronic fatigue 08/07/2008  . Allergic rhinitis 04/10/2007  . Diabetes mellitus type II, controlled ([place]) 12/21/2006  . Hyperlipidemia 04/12/2006  . Essential hypertension 04/12/2006    Past Surgical History:  Procedure Laterality Date  . colonscopy    . DIAGNOSTIC LAPAROSCOPY    . FOOT SURGERY Bilateral    hammer toes  . HEMORRHOID SURGERY    . HERNIA REPAIR  01/20/2011   incisional  . INCISIONAL HERNIA REPAIR  01/20/2011   Procedure: LAPAROSCOPIC INCISIONAL HERNIA;  Surgeon: Judieth Keens, DO;  Location: [facility];  Service: General;  Laterality: N/A;  Laparoscopic repair of a recurrent incisional hernia with Mesh  . INGUINAL HERNIA REPAIR     x's 3  . VENTRAL HERNIA REPAIR      OB History   No obstetric history on file.      Home Medications    Prior to Admission medications   Medication Sig Start Date End Date Taking? Authorizing Provider  amLODipine (NORVASC) 5 MG tablet Take 1  tablet (5 mg total) by mouth daily. 11/22/18   [name], DO  Cyanocobalamin (VITAMIN B 12 PO) Take 1 tablet by mouth daily.    [provider]  Fish Oil-Cholecalciferol (FISH OIL + D3 PO) Take 1 capsule by mouth daily.    [provider]  fluticasone (FLONASE) 50 MCG/ACT nasal spray Place 2 sprays into both nostrils daily. 04/25/18   Roma Schanz R, DO  fluticasone (FLONASE) 50 MCG/ACT nasal spray Place 2 sprays into both nostrils daily. 08/09/18   [name], DO  glimepiride (AMARYL) 2 MG tablet Take 1 tablet (2 mg total) by mouth daily before breakfast. 02/27/19    Carollee Herter, Alferd Apa, DO  loratadine (CLARITIN) 10 MG tablet Take 1 tablet (10 mg total) by mouth daily. 08/09/18   [name], DO  losartan (COZAAR) 100 MG tablet TK 1 T PO D 10/18/18   [provider]  metFORMIN (GLUCOPHAGE) 1000 MG tablet Take 1 tablet (1,000 mg total) by mouth 2 (two) times daily with a meal. 02/27/19   Carollee Herter, Yvonne R, DO  montelukast (SINGULAIR) 10 MG tablet Take 1 tablet (10 mg total) by mouth at bedtime. 04/30/17   [name], DO  Multiple Vitamin (MULTIVITAMIN) tablet Take 1 tablet by mouth daily. OVER 50 VITAMIN    [provider]  repaglinide (PRANDIN) 0.5 MG tablet Take 1 tablet (0.5 mg total) by mouth 3 (three) times daily before meals. 04/02/19   [name], DO  sitaGLIPtin-metformin (JANUMET) 50-1000 MG tablet TAKE 1 TABLET BY MOUTH TWICE DAILY WITH A MEAL 03/18/19   Carollee Herter, Alferd Apa, DO  sitaGLIPtin-metformin (JANUMET) 50-1000 MG tablet janumet      tab 50-1000    [provider]  traZODone (DESYREL) 50 MG tablet Take 0.5-1 tablets (25-50 mg total) by mouth at bedtime as needed for sleep. 11/05/18   [name], DO    Family History Family History  Problem Relation Age of Onset  . Breast cancer Mother   . Diabetes Mother   . Dementia Mother   . Liver cancer Mother   . Heart disease Father        heart attack  . Diabetes Brother   . Breast cancer Sister   . Colon cancer Neg Hx   . Stomach cancer Neg Hx     Social History Social History   Tobacco Use  . Smoking status: Former Smoker    Packs/day: 1.00    Years: 20.00    Pack years: 20.00    Types: Cigarettes    Quit date: 01/05/1996    Years since quitting: 23.2  . Smokeless tobacco: Never Used  Substance Use Topics  . Alcohol use: Not Currently    Alcohol/week: 4.0 standard drinks    Types: 4 Glasses of wine per week  . Drug use: No     Allergies   Aspirin   Review of Systems Review of Systems   Constitutional: Positive for fever. Negative for fatigue.  HENT: Positive for congestion and rhinorrhea. Negative for sinus pressure and sore throat.   Eyes: Negative for photophobia, pain and visual disturbance.  Respiratory: Positive for cough. Negative for shortness of breath.   Cardiovascular: Negative for chest pain.  Gastrointestinal: Negative for abdominal pain, nausea and vomiting.  Genitourinary: Negative for decreased urine volume and hematuria.  Musculoskeletal: Negative for myalgias, neck pain and neck stiffness.  Neurological: Negative for dizziness, syncope, facial  asymmetry, speech difficulty, weakness, light-headedness, numbness and headaches.     Physical Exam Triage Vital Signs ED Triage Vitals  Enc Vitals Group     BP 04/10/19 2005 (!) 161/90     Pulse Rate 04/10/19 2005 (!) 109     Resp 04/10/19 2005 18     Temp 04/10/19 2005 99.4 F (37.4 C)     Temp Source 04/10/19 2005 Oral     SpO2 04/10/19 2005 98 %     Weight --      Height --      Head Circumference --      Peak Flow --      Pain Score 04/10/19 2014 0     Pain Loc --      Pain Edu? --      Excl. in [place]? --    No data found.  Updated Vital Signs BP (!) 161/90 (BP Location: Right Arm)   Pulse (!) 109   Temp 99.4 F (37.4 C) (Oral)   Resp 18   SpO2 98%   Visual Acuity Right Eye Distance:   Left Eye Distance:   Bilateral Distance:    Right Eye Near:   Left Eye Near:    Bilateral Near:     Physical Exam Vitals and nursing note reviewed.  Constitutional:      General: She is not in acute distress.    Appearance: She is well-developed.  HENT:     Head: Normocephalic and atraumatic.     Ears:     Comments: Bilateral ears without tenderness to palpation of external auricle, tragus and mastoid, EAC's without erythema or swelling, TM's with good bony landmarks and cone of light. Non erythematous.     Mouth/Throat:     Comments: Oral mucosa pink and moist, no tonsillar enlargement or  exudate. Posterior pharynx patent and nonerythematous, no uvula deviation or swelling. Normal phonation. Eyes:     Extraocular Movements: Extraocular movements intact.     Conjunctiva/sclera: Conjunctivae normal.     Pupils: Pupils are equal, round, and reactive to light.  Cardiovascular:     Rate and Rhythm: Normal rate and regular rhythm.     Heart sounds: No murmur.  Pulmonary:     Effort: Pulmonary effort is normal. No respiratory distress.     Breath sounds: Normal breath sounds.     Comments: Breathing comfortably at rest, CTABL, no wheezing, rales or other adventitious sounds auscultated Abdominal:     Palpations: Abdomen is soft.     Tenderness: There is no abdominal tenderness.  Musculoskeletal:     Cervical back: Neck supple.  Skin:    General: Skin is warm and dry.  Neurological:     General: No focal deficit present.     Mental Status: She is alert and oriented to person, place, and time. Mental status is at baseline.     Cranial Nerves: No cranial nerve deficit.     Motor: No weakness.     Gait: Gait normal.     Comments: Speech clear, face symmetric      UC Treatments / Results  Labs (all labs ordered are listed, but only abnormal results are displayed) Labs Reviewed  SARS CORONAVIRUS 2 (TAT 6-24 HRS)    EKG   Radiology No results found.  Procedures Procedures (including critical care time)  Medications Ordered in UC Medications - No data to display  Initial Impression / Assessment and Plan / UC Course  I have reviewed the triage vital  signs and the nursing notes.  Pertinent labs & imaging results that were available during my care of the patient were reviewed by me and considered in my medical decision making (see chart for details).     Covid PCR pending, vital signs stable in clinic here today with slight low-grade fever.  A few days of congestion/cough, lungs clear.  No other symptoms/signs of infection.  Recommending symptomatic and  supportive care, Tylenol and ibuprofen as needed for any fevers, continue Zyrtec, may supplement with other OTC medicines for congestion/cough.  Rest and fluids.  Continue to monitor blood pressure, no red flags for hypertensive urgency/emergency, ACS or CVA at this time.  Continue to take medicines as prescribed, follow-up with PCP if consistently remaining elevated.  Discussed strict return precautions. Patient verbalized understanding and is agreeable with plan.  Final Clinical Impressions(s) / UC Diagnoses   Final diagnoses:  Fever, unspecified  Essential hypertension     Discharge Instructions     Tylenol and Ibuprofen for fever Monitor MyChart for COVID results Continue zyrtec, add in flonase if needed Continue blood pressure medicine as prescribed and monitor Please follow up with your primary care doctor to recheck blood pressure and discuss any need for medication changes.   Please go to Emergency Room if you start to experience severe headache, vision changes, decreased urine production, chest pain, shortness of breath, speech slurring, one sided weakness.   ED Prescriptions    None     PDMP not reviewed this encounter.   Janith Lima, PA-C 04/11/19 1056

## 2019-05-16 ENCOUNTER — Other Ambulatory Visit: Payer: Self-pay

## 2019-05-16 MED ORDER — AMLODIPINE BESYLATE 5 MG PO TABS: 5.0000 mg | ORAL_TABLET | Freq: Every day | ORAL | 1 refills | Status: DC

## 2019-06-09 ENCOUNTER — Encounter: Payer: Self-pay | Admitting: Family Medicine

## 2019-06-09 ENCOUNTER — Ambulatory Visit (INDEPENDENT_AMBULATORY_CARE_PROVIDER_SITE_OTHER): Payer: Medicare Other | Admitting: Family Medicine

## 2019-06-09 ENCOUNTER — Ambulatory Visit (HOSPITAL_BASED_OUTPATIENT_CLINIC_OR_DEPARTMENT_OTHER)
Admission: RE | Admit: 2019-06-09 | Discharge: 2019-06-09 | Disposition: A | Discharge status: Home / Self Care | Payer: Medicare Other | Source: Ambulatory Visit | Attending: Family Medicine | Admitting: Family Medicine

## 2019-06-09 ENCOUNTER — Other Ambulatory Visit: Payer: Self-pay

## 2019-06-09 VITALS — BP 116/60 | HR 90 | Temp 97.2°F | Resp 18 | Ht 65.6 in | Wt 186.4 lb

## 2019-06-09 DIAGNOSIS — M79605 Pain in left leg: Secondary | ICD-10-CM | POA: Diagnosis not present

## 2019-06-09 DIAGNOSIS — E1169 Type 2 diabetes mellitus with other specified complication: Secondary | ICD-10-CM

## 2019-06-09 DIAGNOSIS — M5441 Lumbago with sciatica, right side: Secondary | ICD-10-CM

## 2019-06-09 DIAGNOSIS — M5442 Lumbago with sciatica, left side: Secondary | ICD-10-CM | POA: Insufficient documentation

## 2019-06-09 DIAGNOSIS — E1165 Type 2 diabetes mellitus with hyperglycemia: Secondary | ICD-10-CM | POA: Diagnosis not present

## 2019-06-09 DIAGNOSIS — M79604 Pain in right leg: Secondary | ICD-10-CM | POA: Diagnosis not present

## 2019-06-09 DIAGNOSIS — E785 Hyperlipidemia, unspecified: Secondary | ICD-10-CM

## 2019-06-09 DIAGNOSIS — M545 Low back pain: Secondary | ICD-10-CM | POA: Diagnosis not present

## 2019-06-09 MED ORDER — CYCLOBENZAPRINE HCL 10 MG PO TABS: 10.0000 mg | ORAL_TABLET | Freq: Three times a day (TID) | ORAL | 0 refills | Status: DC | PRN

## 2019-06-09 NOTE — Progress Notes (Signed)
Patient ID: Megan [name], female    DOB: Sep 28, 1955  Age: 64 y.o. MRN: [id number]    Subjective:  Subjective  HPI Megan [name] presents for a fall about 1 month ago and now has burning in thighs and L hip  Pain with standing / sitting    No other complaints  Review of Systems  Constitutional: Negative for appetite change, diaphoresis, fatigue and unexpected weight change.  Eyes: Negative for pain, redness and visual disturbance.  Respiratory: Negative for cough, chest tightness, shortness of breath and wheezing.   Cardiovascular: Negative for chest pain, palpitations and leg swelling.  Endocrine: Negative for cold intolerance, heat intolerance, polydipsia, polyphagia and polyuria.  Genitourinary: Negative for difficulty urinating, dysuria and frequency.  Neurological: Positive for numbness. Negative for dizziness, light-headedness and headaches.    History Past Medical History:  Diagnosis Date  . Allergy   . Arthritis    fingers  . Colon polyp   . Diabetes mellitus   . Diverticulosis   . GERD (gastroesophageal reflux disease)    occasionally  . H/O hiatal hernia    ?  Marland Kitchen Hemorrhoids   . Hyperlipidemia    takes fish oil  . Hypertension   . Infectious colitis   . Ventral hernia     Megan [name] has a past surgical history that includes Inguinal hernia repair; Hemorrhoid surgery; Ventral hernia repair; Foot surgery (Bilateral); colonscopy; Diagnostic laparoscopy; Incisional hernia repair (01/20/2011); and Hernia repair (01/20/2011).   Her family history includes Breast cancer in her mother and sister; Dementia in her mother; Diabetes in her brother and mother; Heart disease in her father; Liver cancer in her mother.Megan [name] reports that Megan [name] quit smoking about 23 years ago. Her smoking use included cigarettes. Megan [name] has a 20.00 pack-year smoking history. Megan [name] has never used smokeless tobacco. Megan [name] reports previous alcohol use of about 4.0 standard drinks of alcohol per week. Megan [name] reports that  Megan [name] does not use drugs.  Current Outpatient Medications on File Prior to Visit  Medication Sig Dispense Refill  . amLODipine (NORVASC) 5 MG tablet Take 1 tablet (5 mg total) by mouth daily. 90 tablet 1  . Cyanocobalamin (VITAMIN B 12 PO) Take 1 tablet by mouth daily.    . Fish Oil-Cholecalciferol (FISH OIL + D3 PO) Take 1 capsule by mouth daily.    . fluticasone (FLONASE) 50 MCG/ACT nasal spray Place 2 sprays into both nostrils daily. 16 g 6  . fluticasone (FLONASE) 50 MCG/ACT nasal spray Place 2 sprays into both nostrils daily. 16 g 6  . glimepiride (AMARYL) 2 MG tablet Take 1 tablet (2 mg total) by mouth daily before breakfast. 30 tablet 2  . loratadine (CLARITIN) 10 MG tablet Take 1 tablet (10 mg total) by mouth daily. 30 tablet 11  . losartan (COZAAR) 100 MG tablet TK 1 T PO D    . metFORMIN (GLUCOPHAGE) 1000 MG tablet Take 1 tablet (1,000 mg total) by mouth 2 (two) times daily with a meal. 60 tablet 2  . montelukast (SINGULAIR) 10 MG tablet Take 1 tablet (10 mg total) by mouth at bedtime. 30 tablet 3  . Multiple Vitamin (MULTIVITAMIN) tablet Take 1 tablet by mouth daily. OVER 50 VITAMIN    . repaglinide (PRANDIN) 0.5 MG tablet Take 1 tablet (0.5 mg total) by mouth 3 (three) times daily before meals. 90 tablet 3  . sitaGLIPtin-metformin (JANUMET) 50-1000 MG tablet TAKE 1 TABLET BY MOUTH TWICE DAILY WITH A MEAL 180 tablet 1  . sitaGLIPtin-metformin (JANUMET) 50-1000  MG tablet janumet      tab 50-1000    . traZODone (DESYREL) 50 MG tablet Take 0.5-1 tablets (25-50 mg total) by mouth at bedtime as needed for sleep. 30 tablet 3   No current facility-administered medications on file prior to visit.     Objective:  Objective  Physical Exam Vitals and nursing note reviewed.  Constitutional:      Appearance: Megan [name] is well-developed.  HENT:     Head: Normocephalic and atraumatic.  Eyes:     Conjunctiva/sclera: Conjunctivae normal.  Neck:     Thyroid: No thyromegaly.     Vascular: No  carotid bruit or JVD.  Cardiovascular:     Rate and Rhythm: Normal rate and regular rhythm.     Heart sounds: Normal heart sounds. No murmur.  Pulmonary:     Effort: Pulmonary effort is normal. No respiratory distress.     Breath sounds: Normal breath sounds. No wheezing or rales.  Chest:     Chest wall: No tenderness.  Musculoskeletal:     Cervical back: Normal range of motion and neck supple.  Neurological:     Mental Status: Megan [name] is alert and oriented to person, place, and time.     Cranial Nerves: Cranial nerves are intact.     Sensory: Sensation is intact.     Motor: Motor function is intact. No weakness, atrophy or abnormal muscle tone.     Gait: Gait is intact.     Deep Tendon Reflexes: Reflexes are normal and symmetric. Babinski sign absent on the right side. Babinski sign absent on the left side.    BP 116/60 (BP Location: Right Arm, Patient Position: Sitting, Cuff Size: Normal)   Pulse 90   Temp (!) 97.2 F (36.2 C) (Temporal)   Resp 18   Ht 5' 5.6" (1.666 m)   Wt 186 lb 6.4 oz (84.6 kg)   SpO2 98%   BMI 30.45 kg/m  Wt Readings from Last 3 Encounters:  06/09/19 186 lb 6.4 oz (84.6 kg)  01/03/19 189 lb 3.2 oz (85.8 kg)  11/05/18 186 lb 3.2 oz (84.5 kg)     Lab Results  Component Value Date   WBC 9.3 11/05/2018   HGB 11.6 (L) 11/05/2018   HCT 36.6 11/05/2018   PLT 369.0 11/05/2018   GLUCOSE 125 (H) 11/05/2018   CHOL 153 11/05/2018   TRIG 149.0 11/05/2018   HDL 50.80 11/05/2018   LDLDIRECT 88.0 04/21/2014   LDLCALC 72 11/05/2018   ALT 29 11/05/2018   AST 23 11/05/2018   NA 139 11/05/2018   K 4.1 11/05/2018   CL 102 11/05/2018   CREATININE 0.74 11/05/2018   BUN 11 11/05/2018   CO2 27 11/05/2018   TSH 0.75 11/05/2018   HGBA1C 7.4 (H) 11/05/2018   MICROALBUR 3.1 (H) 11/05/2018    No results found.   Assessment & Plan:  Plan  I am having Megan [name] start on cyclobenzaprine. I am also having her maintain her multivitamin, Fish  Oil-Cholecalciferol (FISH OIL + D3 PO), Cyanocobalamin (VITAMIN B 12 PO), montelukast, fluticasone, loratadine, fluticasone, losartan, traZODone, metFORMIN, glimepiride, Janumet, repaglinide, Janumet, and amLODipine.  Meds ordered this encounter  Medications  . cyclobenzaprine (FLEXERIL) 10 MG tablet    Sig: Take 1 tablet (10 mg total) by mouth 3 (three) times daily as needed for muscle spasms.    Dispense:  30 tablet    Refill:  0    Problem List Items Addressed This Visit    None  Visit Diagnoses    Acute bilateral low back pain with bilateral sciatica    -  Primary   Relevant Medications   cyclobenzaprine (FLEXERIL) 10 MG tablet   Other Relevant Orders   DG Lumbar Spine Complete    xray --- consider PT Alt between heat and ice  Rest   Follow-up: Return in about 2 weeks (around 06/23/2019), or if symptoms worsen or fail to improve.  [name], DO

## 2019-06-09 NOTE — Patient Instructions (Signed)
Acute Back Pain, Adult Acute back pain is sudden and usually short-lived. It is often caused by an injury to the muscles and tissues in the back. The injury may result from:  A muscle or ligament getting overstretched or torn (strained). Ligaments are tissues that connect bones to each other. Lifting something improperly can cause a back strain.  Wear and tear (degeneration) of the spinal disks. Spinal disks are circular tissue that provides cushioning between the bones of the spine (vertebrae).  Twisting motions, such as while playing sports or doing yard work.  A hit to the back.  Arthritis. You may have a physical exam, lab tests, and imaging tests to find the cause of your pain. Acute back pain usually goes away with rest and home care. Follow these instructions at home: Managing pain, stiffness, and swelling  Take over-the-counter and prescription medicines only as told by your health care provider.  Your health care provider may recommend applying ice during the first 24-48 hours after your pain starts. To do this: ? Put ice in a plastic bag. ? Place a towel between your skin and the bag. ? Leave the ice on for 20 minutes, 2-3 times a day.  If directed, apply heat to the affected area as often as told by your health care provider. Use the heat source that your health care provider recommends, such as a moist heat pack or a heating pad. ? Place a towel between your skin and the heat source. ? Leave the heat on for 20-30 minutes. ? Remove the heat if your skin turns bright red. This is especially important if you are unable to feel pain, heat, or cold. You have a greater risk of getting burned. Activity   Do not stay in bed. Staying in bed for more than 1-2 days can delay your recovery.  Sit up and stand up straight. Avoid leaning forward when you sit, or hunching over when you stand. ? If you work at a desk, sit close to it so you do not need to lean over. Keep your chin tucked  in. Keep your neck drawn back, and keep your elbows bent at a right angle. Your arms should look like the letter "L." ? Sit high and close to the steering wheel when you drive. Add lower back (lumbar) support to your car seat, if needed.  Take short walks on even surfaces as soon as you are able. Try to increase the length of time you walk each day.  Do not sit, drive, or stand in one place for more than 30 minutes at a time. Sitting or standing for long periods of time can put stress on your back.  Do not drive or use heavy machinery while taking prescription pain medicine.  Use proper lifting techniques. When you bend and lift, use positions that put less stress on your back: ? Bend your knees. ? Keep the load close to your body. ? Avoid twisting.  Exercise regularly as told by your health care provider. Exercising helps your back heal faster and helps prevent back injuries by keeping muscles strong and flexible.  Work with a physical therapist to make a safe exercise program, as recommended by your health care provider. Do any exercises as told by your physical therapist. Lifestyle  Maintain a healthy weight. Extra weight puts stress on your back and makes it difficult to have good posture.  Avoid activities or situations that make you feel anxious or stressed. Stress and anxiety increase muscle   tension and can make back pain worse. Learn ways to manage anxiety and stress, such as through exercise. General instructions  Sleep on a firm mattress in a comfortable position. Try lying on your side with your knees slightly bent. If you lie on your back, put a pillow under your knees.  Follow your treatment plan as told by your health care provider. This may include: ? Cognitive or behavioral therapy. ? Acupuncture or massage therapy. ? Meditation or yoga. Contact a health care provider if:  You have pain that is not relieved with rest or medicine.  You have increasing pain going down  into your legs or buttocks.  Your pain does not improve after 2 weeks.  You have pain at night.  You lose weight without trying.  You have a fever or chills. Get help right away if:  You develop new bowel or bladder control problems.  You have unusual weakness or numbness in your arms or legs.  You develop nausea or vomiting.  You develop abdominal pain.  You feel faint. Summary  Acute back pain is sudden and usually short-lived.  Use proper lifting techniques. When you bend and lift, use positions that put less stress on your back.  Take over-the-counter and prescription medicines and apply heat or ice as directed by your health care provider. This information is not intended to replace advice given to you by your health care provider. Make sure you discuss any questions you have with your health care provider. Document Revised: 04/23/2018 Document Reviewed: 08/16/2016 Elsevier Patient Education  2020 [employer]  

## 2019-06-19 DIAGNOSIS — Z1231 Encounter for screening mammogram for malignant neoplasm of breast: Secondary | ICD-10-CM | POA: Diagnosis not present

## 2019-06-20 ENCOUNTER — Telehealth: Payer: Self-pay | Admitting: Family Medicine

## 2019-06-20 DIAGNOSIS — E1151 Type 2 diabetes mellitus with diabetic peripheral angiopathy without gangrene: Secondary | ICD-10-CM

## 2019-06-20 DIAGNOSIS — IMO0002 Reserved for concepts with insufficient information to code with codable children: Secondary | ICD-10-CM

## 2019-06-20 MED ORDER — JANUMET 50-1000 MG PO TABS: ORAL_TABLET | ORAL | 1 refills | Status: DC

## 2019-06-20 NOTE — Telephone Encounter (Signed)
Refill sent.

## 2019-06-20 NOTE — Telephone Encounter (Signed)
Medication: sitaGLIPtin-metformin (JANUMET) 50-1000 MG tablet   Has the patient contacted their pharmacy? No. (If no, request that the patient contact the pharmacy for the refill.) (If yes, when and what did the pharmacy advise?)  Preferred Pharmacy (with phone number or street name): [facility], [place] - [place] [facility]  [address] [place] [address]  Phone:  [phone] Fax:  [phone]  DEA #:  [id number]  Agent: Please be advised that RX refills may take up to 3 business days. We ask that you follow-up with your pharmacy.

## 2019-07-03 DIAGNOSIS — M5410 Radiculopathy, site unspecified: Secondary | ICD-10-CM | POA: Diagnosis not present

## 2019-07-03 DIAGNOSIS — M545 Low back pain: Secondary | ICD-10-CM | POA: Diagnosis not present

## 2019-07-03 DIAGNOSIS — M25561 Pain in right knee: Secondary | ICD-10-CM | POA: Diagnosis not present

## 2019-07-10 ENCOUNTER — Ambulatory Visit: Payer: 59 | Admitting: Family Medicine

## 2019-07-14 ENCOUNTER — Other Ambulatory Visit: Payer: Medicare Other

## 2019-07-14 ENCOUNTER — Other Ambulatory Visit (INDEPENDENT_AMBULATORY_CARE_PROVIDER_SITE_OTHER): Payer: Medicare Other

## 2019-07-14 ENCOUNTER — Other Ambulatory Visit: Payer: Self-pay | Admitting: Family Medicine

## 2019-07-14 DIAGNOSIS — E1169 Type 2 diabetes mellitus with other specified complication: Secondary | ICD-10-CM

## 2019-07-14 DIAGNOSIS — E1165 Type 2 diabetes mellitus with hyperglycemia: Secondary | ICD-10-CM | POA: Diagnosis not present

## 2019-07-14 DIAGNOSIS — E785 Hyperlipidemia, unspecified: Secondary | ICD-10-CM | POA: Diagnosis not present

## 2019-07-14 LAB — COMPREHENSIVE METABOLIC PANEL
ALT: 41 U/L — ABNORMAL HIGH (ref 0–35)
AST: 30 U/L (ref 0–37)
Albumin: 4.3 g/dL (ref 3.5–5.2)
Alkaline Phosphatase: 61 U/L (ref 39–117)
BUN: 16 mg/dL (ref 6–23)
CO2: 27 mEq/L (ref 19–32)
Calcium: 10.2 mg/dL (ref 8.4–10.5)
Chloride: 103 mEq/L (ref 96–112)
Creatinine, Ser: 0.79 mg/dL (ref 0.40–1.20)
GFR: 88.02 mL/min (ref >60.00)
Glucose, Bld: 131 mg/dL — ABNORMAL HIGH (ref 70–99)
Potassium: 4.2 mEq/L (ref 3.5–5.1)
Sodium: 139 mEq/L (ref 135–145)
Total Bilirubin: 0.6 mg/dL (ref 0.2–1.2)
Total Protein: 8 g/dL (ref 6.0–8.3)

## 2019-07-14 LAB — LIPID PANEL
Cholesterol: 149 mg/dL (ref 0–200)
HDL: 45.8 mg/dL (ref >39.00)
LDL Cholesterol: 73 mg/dL (ref 0–99)
NonHDL: 103.22
Total CHOL/HDL Ratio: 3
Triglycerides: 150 mg/dL — ABNORMAL HIGH (ref 0.0–149.0)
VLDL: 30 mg/dL (ref 0.0–40.0)

## 2019-07-14 LAB — MICROALBUMIN / CREATININE URINE RATIO
Creatinine,U: 40.9 mg/dL
Microalb Creat Ratio: 2 mg/g (ref 0.0–30.0)
Microalb, Ur: 0.8 mg/dL (ref 0.0–1.9)

## 2019-07-14 LAB — HEMOGLOBIN A1C: Hgb A1c MFr Bld: 7.3 % — ABNORMAL HIGH (ref 4.6–6.5)

## 2019-07-16 ENCOUNTER — Other Ambulatory Visit: Payer: Self-pay

## 2019-07-16 DIAGNOSIS — M545 Low back pain: Secondary | ICD-10-CM | POA: Diagnosis not present

## 2019-07-16 MED ORDER — GLIMEPIRIDE 4 MG PO TABS
4.0000 mg | ORAL_TABLET | Freq: Every day | ORAL | 2 refills | Status: DC
Start: 2019-07-16 — End: 2019-09-02

## 2019-07-17 ENCOUNTER — Ambulatory Visit: Payer: Medicare Other | Admitting: Family Medicine

## 2019-07-17 DIAGNOSIS — M48061 Spinal stenosis, lumbar region without neurogenic claudication: Secondary | ICD-10-CM | POA: Diagnosis not present

## 2019-07-17 DIAGNOSIS — M545 Low back pain: Secondary | ICD-10-CM | POA: Diagnosis not present

## 2019-08-02 ENCOUNTER — Other Ambulatory Visit: Payer: Self-pay | Admitting: Family Medicine

## 2019-08-12 ENCOUNTER — Telehealth: Payer: Self-pay | Admitting: Family Medicine

## 2019-08-12 DIAGNOSIS — E1151 Type 2 diabetes mellitus with diabetic peripheral angiopathy without gangrene: Secondary | ICD-10-CM

## 2019-08-12 DIAGNOSIS — IMO0002 Reserved for concepts with insufficient information to code with codable children: Secondary | ICD-10-CM

## 2019-08-12 NOTE — Telephone Encounter (Signed)
CallerSymia Herdt  Call Back # [phone]   sitaGLIPtin-metformin (JANUMET) 50-1000 MG tablet [[id number]]    Patient states she would like a call back in regards to medication.

## 2019-08-12 NOTE — Telephone Encounter (Signed)
Patient is currently taking Glimiperide 4 mg daily, Prandin 0.5 mg TID, and Janument 50-1000 mg Bid for diabetes. Per your last lab note patient is to be on all three. However patient feels that this is ust too much medication to be taking for this. She did mention that the glimiperide makes her sick on her stomach. Please advise if there is a change that could be made to decrease medication usage.

## 2019-08-12 NOTE — Telephone Encounter (Signed)
Hgba1c is not at goal <6,5 She was >7 We can refer to endo --- here Cut glimiperide in 1/2 until she sees her

## 2019-08-13 NOTE — Telephone Encounter (Signed)
Patient notified and verbalized understanding. I have placed endo referral for patient.

## 2019-09-02 ENCOUNTER — Ambulatory Visit (INDEPENDENT_AMBULATORY_CARE_PROVIDER_SITE_OTHER): Payer: Medicare Other | Admitting: Internal Medicine

## 2019-09-02 ENCOUNTER — Encounter: Payer: Self-pay | Admitting: Internal Medicine

## 2019-09-02 ENCOUNTER — Other Ambulatory Visit: Payer: Self-pay

## 2019-09-02 VITALS — BP 140/70 | HR 76 | Ht 65.6 in | Wt 186.6 lb

## 2019-09-02 DIAGNOSIS — E1165 Type 2 diabetes mellitus with hyperglycemia: Secondary | ICD-10-CM | POA: Diagnosis not present

## 2019-09-02 DIAGNOSIS — E785 Hyperlipidemia, unspecified: Secondary | ICD-10-CM | POA: Insufficient documentation

## 2019-09-02 MED ORDER — GLIMEPIRIDE 4 MG PO TABS: 6.0000 mg | ORAL_TABLET | Freq: Every day | ORAL | 3 refills | Status: DC

## 2019-09-02 NOTE — Progress Notes (Signed)
Name: Megan [name]  MRN/ DOB: [id number], 03-26-1955   Age/ Sex: 64 y.o., female    PCP: Carollee Herter, Alferd Apa, DO   Reason for Endocrinology Evaluation: Type 2 Diabetes Mellitus     Date of Initial Endocrinology Visit: 09/02/2019     PATIENT IDENTIFIER: [name] is a 64 y.o. female with a past medical history of T2DM, HTN and Dyslipidemia. The patient presented for initial endocrinology clinic visit on 09/02/2019 for consultative assistance with her diabetes management.    HPI: Ms. Bacorn was    Diagnosed with DM in 2008 Prior Medications tried/Intolerance:  Currently checking blood sugars 1 x / day,  before breakfast  Hypoglycemia episodes : yes              Symptoms: sweaty               Frequency: 2/ month Hemoglobin A1c has ranged from 6.5% in 2014, peaking at 7.4% in 10/2018. Patient required assistance for hypoglycemia: no  Patient has required hospitalization within the last 1 year from hyper or hypoglycemia: no  In terms of diet, the patient eats 3 meals a day, does not snack. Drinks sweet tea and orange juice.    HOME DIABETES REGIMEN: Glimepiride 4 mg daily  Repaglinide 0.5 mg TID Janumet 50-1000  BID      Statin:declines due to fear of side effects  ACE-I/ARB: yes Prior Diabetic Education: no   METER DOWNLOAD SUMMARY:  12- 167 mg/dL    DIABETIC COMPLICATIONS: Microvascular complications:    Denies: CKD, retinopathy , neuropathy   Last eye exam: Completed 2020  Macrovascular complications:    Denies: CAD, PVD, CVA   PAST HISTORY: Past Medical History:  Past Medical History:  Diagnosis Date  . Allergy   . Arthritis    fingers  . Colon polyp   . Diabetes mellitus   . Diverticulosis   . GERD (gastroesophageal reflux disease)    occasionally  . H/O hiatal hernia    ?  Marland Kitchen Hemorrhoids   . Hyperlipidemia    takes fish oil  . Hypertension   . Infectious colitis   . Ventral hernia    Past Surgical History:  Past  Surgical History:  Procedure Laterality Date  . colonscopy    . DIAGNOSTIC LAPAROSCOPY    . FOOT SURGERY Bilateral    hammer toes  . HEMORRHOID SURGERY    . HERNIA REPAIR  01/20/2011   incisional  . INCISIONAL HERNIA REPAIR  01/20/2011   Procedure: LAPAROSCOPIC INCISIONAL HERNIA;  Surgeon: Judieth Keens, DO;  Location: [facility];  Service: General;  Laterality: N/A;  Laparoscopic repair of a recurrent incisional hernia with Mesh  . INGUINAL HERNIA REPAIR     x's 3  . VENTRAL HERNIA REPAIR        Social History:  reports that she quit smoking about 23 years ago. Her smoking use included cigarettes. She has a 20.00 pack-year smoking history. She has never used smokeless tobacco. She reports previous alcohol use of about 4.0 standard drinks of alcohol per week. She reports that she does not use drugs. Family History:  Family History  Problem Relation Age of Onset  . Breast cancer Mother   . Diabetes Mother   . Dementia Mother   . Liver cancer Mother   . Heart disease Father        heart attack  . Diabetes Brother   . Breast cancer Sister   . Colon cancer Neg  Hx   . Stomach cancer Neg Hx      HOME MEDICATIONS: Allergies as of 09/02/2019      Reactions   Aspirin Nausea Only      Medication List       Accurate as of September 02, 2019 10:57 AM. If you have any questions, ask your nurse or doctor.        STOP taking these medications   cyclobenzaprine 10 MG tablet Commonly known as: FLEXERIL Stopped by: Dorita Sciara, MD   VITAMIN B 12 PO Stopped by: Dorita Sciara, MD     TAKE these medications   amLODipine 5 MG tablet Commonly known as: NORVASC Take 1 tablet (5 mg total) by mouth daily.   b complex vitamins tablet Take 1 tablet by mouth daily.   FISH OIL + D3 PO Take 1,200 mg by mouth daily.   fluticasone 50 MCG/ACT nasal spray Commonly known as: FLONASE Place 2 sprays into both nostrils daily.   glimepiride 4 MG tablet Commonly known as:  AMARYL Take 1 tablet (4 mg total) by mouth daily before breakfast.   Janumet 50-1000 MG tablet Generic drug: sitaGLIPtin-metformin TAKE 1 TABLET BY MOUTH TWICE DAILY WITH A MEAL   loratadine 10 MG tablet Commonly known as: CLARITIN Take 1 tablet (10 mg total) by mouth daily.   losartan 100 MG tablet Commonly known as: COZAAR TK 1 T PO D   montelukast 10 MG tablet Commonly known as: SINGULAIR Take 1 tablet (10 mg total) by mouth at bedtime.   multivitamin tablet Take 1 tablet by mouth daily. OVER 50 VITAMIN   repaglinide 0.5 MG tablet Commonly known as: PRANDIN Take 1 tablet (0.5 mg total) by mouth 3 (three) times daily before meals.   traZODone 50 MG tablet Commonly known as: DESYREL Take 0.5-1 tablets (25-50 mg total) by mouth at bedtime as needed for sleep.   Vitamin D3 250 MCG (10000 UT) Tabs Take by mouth daily.        ALLERGIES: Allergies  Allergen Reactions  . Aspirin Nausea Only     REVIEW OF SYSTEMS: A comprehensive ROS was conducted with the patient and is negative except as per HPI and below:  Review of Systems  Musculoskeletal: Positive for back pain and joint pain.      OBJECTIVE:   VITAL SIGNS: BP 140/70 (BP Location: Left Arm, Patient Position: Sitting, Cuff Size: Normal)   Pulse 76   Ht 5' 5.6" (1.666 m)   Wt 186 lb 9.6 oz (84.6 kg)   SpO2 98%   BMI 30.49 kg/m    PHYSICAL EXAM:  General: Pt appears well and is in NAD  Neck: General: Supple without adenopathy or carotid bruits. Thyroid: Thyroid size normal.  No goiter or nodules appreciated. No thyroid bruit.  Lungs: Clear with good BS bilat with no rales, rhonchi, or wheezes  Heart: RRR with normal S1 and S2 and no gallops; no murmurs; no rub  Abdomen: Normoactive bowel sounds, soft, nontender, without masses or organomegaly palpable  Extremities:  Lower extremities - No pretibial edema. No lesions.  Skin: Normal texture and temperature to palpation.   Neuro: MS is good with  appropriate affect, pt is alert and Ox3    DM foot exam: 09/02/2019  The skin of the feet is intact without sores or ulcerations. The pedal pulses are 2+ on right and 2+ on left. The sensation is intact to a screening 5.07, 10 gram monofilament bilaterally   DATA REVIEWED:  Lab Results  Component Value Date   HGBA1C 7.3 (H) 07/14/2019   HGBA1C 7.4 (H) 11/05/2018   HGBA1C 6.9 (H) 03/07/2018   Lab Results  Component Value Date   MICROALBUR 0.8 07/14/2019   LDLCALC 73 07/14/2019   CREATININE 0.79 07/14/2019   Lab Results  Component Value Date   MICRALBCREAT 2.0 07/14/2019    Lab Results  Component Value Date   CHOL 149 07/14/2019   HDL 45.80 07/14/2019   LDLCALC 73 07/14/2019   LDLDIRECT 88.0 04/21/2014   TRIG 150.0 (H) 07/14/2019   CHOLHDL 3 07/14/2019        ASSESSMENT / PLAN / RECOMMENDATIONS:   1) Type 2 Diabetes Mellitus, Sub-Optimally controlled, Without complications - Most recent A1c of 7.3 %. Goal A1c < 7.0 %.    Plan: GENERAL: I have discussed with the patient the pathophysiology of diabetes. We went over the natural progression of the disease. We talked about both insulin resistance and insulin deficiency. We stressed the importance of lifestyle changes including diet and exercise. I explained the complications associated with diabetes including retinopathy, nephropathy, neuropathy as well as increased risk of cardiovascular disease. We went over the benefit seen with glycemic control.    I explained to the patient that diabetic patients are at higher than normal risk for amputations.    In review of the patient's glucose meter, she had 3 hypoglycemia episodes at 34, 41, and 44 mg/dL. Pt has NO recollection of these readings, this is very concerning to me , she probably has some confusion during these times to where she does not recall them, nor has she wrote them down ( even though she has written the rest down) and pt assures me no one else uses this meter  but her.   I am going to adjust her medications as below, my priority is to eliminate hypoglycemia, as the pt lives alone.   Will consider switching Januvia to GLP-1 agonist in the future    MEDICATIONS: - STOP Repaglinide - Glimepeiride 4 mg, one and a half  tablet before Breakfast  - Janumet 50-1000 mg twice daily   EDUCATION / INSTRUCTIONS:  BG monitoring instructions: Patient is instructed to check her blood sugars 1 times a day, fasting .  Call [facility] if: BG persistently < 70  . I reviewed the Rule of 15 for the treatment of hypoglycemia in detail with the patient. Literature supplied.   2) Diabetic complications:   Eye: Does not have known diabetic retinopathy.   Neuro/ Feet: Does  have known diabetic peripheral neuropathy.  Renal: Patient does  have known baseline CKD. She is  on an ACEI/ARB at present.   3) Dyslipidemia: Patient has declined statins in the past due to fear of side effects. We discussed cardiovascular benefits of statins, will continue to visit this topic in future visits.    F/U in 8 weeks          Signed electronically by: Mack Guise, MD  [facility]  [facility] [address] [place], [place] [address] Phone: [phone] FAX: [phone]   CC: [address] [place] [address] Phone: [phone]  Fax: [phone]    Return to Endocrinology clinic as below: No future appointments.

## 2019-09-02 NOTE — Patient Instructions (Addendum)
-   STOP Repaglinide - Glimepeiride 4 mg, one and a half  tablet before Breakfast  - Janumet 50-1000 mg twice daily       HOW TO TREAT LOW BLOOD SUGARS (Blood sugar LESS THAN 70 MG/DL)  Please follow the RULE OF 15 for the treatment of hypoglycemia treatment (when your (blood sugars are less than 70 mg/dL)    STEP 1: Take 15 grams of carbohydrates when your blood sugar is low, which includes:   3-4 GLUCOSE TABS  OR  3-4 OZ OF JUICE OR REGULAR SODA OR  ONE TUBE OF GLUCOSE GEL     STEP 2: RECHECK blood sugar in 15 MINUTES STEP 3: If your blood sugar is still low at the 15 minute recheck --> then, go back to STEP 1 and treat AGAIN with another 15 grams of carbohydrates.

## 2019-09-04 DIAGNOSIS — M4310 Spondylolisthesis, site unspecified: Secondary | ICD-10-CM | POA: Insufficient documentation

## 2019-09-04 DIAGNOSIS — M545 Low back pain: Secondary | ICD-10-CM | POA: Diagnosis not present

## 2019-09-04 DIAGNOSIS — M542 Cervicalgia: Secondary | ICD-10-CM | POA: Diagnosis not present

## 2019-09-04 DIAGNOSIS — M48061 Spinal stenosis, lumbar region without neurogenic claudication: Secondary | ICD-10-CM | POA: Insufficient documentation

## 2019-09-08 ENCOUNTER — Telehealth: Payer: Self-pay | Admitting: Family Medicine

## 2019-09-08 NOTE — Telephone Encounter (Signed)
Please advise 

## 2019-09-08 NOTE — Telephone Encounter (Signed)
Patient states since blood pressure medication was changed, she feels her numbers are high 09/07/21 at 02:15pm 166/105, 09/08/19 ASM 150/102. 09/07/19 123/81. She would like to some advise.

## 2019-09-08 NOTE — Telephone Encounter (Signed)
Can increase norvasc 10 mg  #90 1 refill F/u in office 2 weeks

## 2019-09-09 NOTE — Telephone Encounter (Signed)
Spoke with patient. Pt states BP has come down today to 138/80. Pt states she was having a stressful day yesterday. I advised the patient we will hold off on the increase and she monitor her BP and let us know if it increases again.

## 2019-09-09 NOTE — Telephone Encounter (Signed)
Agree Alvera Tourigny R Lowne Chase, DO  

## 2019-09-11 NOTE — Progress Notes (Signed)
Assessment/Plan:   1.  Abnormal involuntary movements  -sounded in the past like possibly simple motor tics but it sounded less like that today.  Wonder if possible FND.    -decided to do EEG and if neg to do ambulatory EEG.    -further recommendations will follow   Subjective:   Megan [name] was seen today in follow up for twitching.  My previous records as well as any outside records available were reviewed prior to todays visit.  I have not seen the patient since January, 2019.  I saw her for the same in 2017 in 2018.  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  States that the twitch is now "all over her."  She thinks that it moves on its own as opposed to a need to move the extremity.  It can happen in the arms or legs.  It happens all day long.  "I had it as a kid."   Not sure if stress/anxiety/fatigue changes it.  Went to integrative therapies on one occasion but "it was kind of weird."  Pt states that it happens every day, all day.  No fam hx of abnormal movements.     CURRENT MEDICATIONS:  Outpatient Encounter Medications as of 09/15/2019  Medication Sig  . amLODipine (NORVASC) 5 MG tablet Take 1 tablet (5 mg total) by mouth daily.  Marland Kitchen b complex vitamins tablet Take 1 tablet by mouth daily.  . Cholecalciferol (VITAMIN D3) 250 MCG (10000 UT) TABS Take by mouth daily.  Marland Kitchen Fish Oil-Cholecalciferol (FISH OIL + D3 PO) Take 1,200 mg by mouth daily.   . fluticasone (FLONASE) 50 MCG/ACT nasal spray Place 2 sprays into both nostrils daily.  Marland Kitchen glimepiride (AMARYL) 4 MG tablet Take 1.5 tablets (6 mg total) by mouth daily before breakfast.  . loratadine (CLARITIN) 10 MG tablet Take 1 tablet (10 mg total) by mouth daily.  Marland Kitchen losartan (COZAAR) 100 MG tablet TK 1 T PO D  . montelukast (SINGULAIR) 10 MG tablet Take 1 tablet (10  mg total) by mouth at bedtime.  . Multiple Vitamin (MULTIVITAMIN) tablet Take 1 tablet by mouth daily. OVER 50 VITAMIN  . sitaGLIPtin-metformin (JANUMET) 50-1000 MG tablet TAKE 1 TABLET BY MOUTH TWICE DAILY WITH A MEAL  . traZODone (DESYREL) 50 MG tablet Take 0.5-1 tablets (25-50 mg total) by mouth at bedtime as needed for sleep.   No facility-administered encounter medications on file as of 09/15/2019.     Objective:   PHYSICAL EXAMINATION:    VITALS:   Vitals:   09/15/19 0754  BP: (!) 150/75  Pulse: 93  SpO2: 97%  Weight: 184 lb (83.5 kg)  Height: 5\' 5"  (1.651 m)    GEN:  The patient appears stated age and is in NAD. HEENT:  Normocephalic, atraumatic.  The mucous membranes are moist. The superficial temporal arteries are without ropiness or tenderness. CV:  RRR Lungs:  CTAB Neck/HEME:  There are no carotid bruits bilaterally.  Neurological examination:  Orientation: The patient is alert and oriented x3. Cranial nerves: There is good facial symmetry.The speech is fluent and clear. Soft palate rises symmetrically and there is no tongue deviation. Hearing is intact to conversational tone. Sensation: Sensation is intact to light touch throughout Motor: Strength is 5/5 in the UE/LE Dtr's:  2+/4 throughout  Movement  examination: Tone: There is normal tone in the UE/LE Abnormal movements:  no tremor.  No myoclonus.  No asterixis.    Coordination:  There is no decremation with RAM's.  Does f-n well with eyes closed Gait and Station: The patient has no difficulty arising out of a deep-seated chair without the use of the hands. The patient's stride length is good.  Ambulates in tandem fashion    Total time spent on today's visit was 30 minutes, including both face-to-face time and nonface-to-face time.  Time included that spent on review of records (prior notes available to me/labs/imaging if pertinent), discussing treatment and goals, answering patient's questions and  coordinating care.  Cc:  [name], DO

## 2019-09-15 ENCOUNTER — Encounter: Payer: Self-pay | Admitting: Neurology

## 2019-09-15 ENCOUNTER — Ambulatory Visit: Payer: Medicare Other | Admitting: Neurology

## 2019-09-15 ENCOUNTER — Other Ambulatory Visit: Payer: Self-pay

## 2019-09-15 VITALS — BP 150/75 | HR 93 | Ht 65.0 in | Wt 184.0 lb

## 2019-09-15 DIAGNOSIS — R259 Unspecified abnormal involuntary movements: Secondary | ICD-10-CM

## 2019-09-15 DIAGNOSIS — R251 Tremor, unspecified: Secondary | ICD-10-CM | POA: Diagnosis not present

## 2019-09-15 NOTE — Patient Instructions (Signed)
Electroencephalogram, Adult An electroencephalogram (EEG) is a test that records electrical activity in the brain. It is often used to diagnose or monitor problems that are related to the brain, such as:  Seizure disorders.  Sleeping problems.  Changes in behavior.  Head injuries.  Fainting spells. Tell a health care provider about:  Any allergies you have.  All medicines you are taking, including vitamins, herbs, eye drops, creams, and over-the-counter medicines.  Any problems you or family members have had with anesthetic medicines.  Any blood disorders you have.  Any surgeries you have had.  Any medical conditions you have or have had, including psychiatric conditions.  Any history of illegal drug use or alcohol abuse. What are the risks? Generally, this is a safe test. If you have a seizure disorder, you may be made to have a seizure during the test. This is done so that your brain activity can be recorded during the seizure. What happens before the procedure?   Arrive with your hair clean and dry. Do not comb your hair toward your scalp to add volume (backcomb), and do not put hair spray, oil, or other hair products in your hair.  Do not have caffeine within 4 hours of having your test.  Follow instructions from your health care provider about: ? Other eating or drinking restrictions. ? Sleeping before the test.  Ask your health care provider about taking your regular and over-the-counter medicines, herbs, and supplements. What happens during the procedure? You will be asked to sit in a chair or lie down. Small metal discs (electrodes) will be attached to your head with an adhesive. These electrodes will pick up on the signals in your brain, and a machine will record the signals. During the test, you may be asked to:  Sit or lie quietly and relax.  Open and close your eyes.  Breathe deeply and rapidly for 3 minutes or longer.  Look at a flashing light for a  short period of time.  Read or look at an image.  Go to sleep. When the test is complete, the electrodes will be removed by using a solution such as acetone. What happens after the procedure? It is up to you to get the results of your procedure. Ask your health care provider, or the department that is doing the procedure, when your results will be ready. Summary  An electroencephalogram (EEG) is a test that is often used to diagnose or monitor problems that are related to the brain.  Do not have caffeine within 4 hours of having your test. Follow other instructions from your health care provider about eating and drinking before the test.  During the procedure, small metal discs (electrodes) will be attached to your head with an adhesive.  During the test, you may be asked to sit or lie quietly and relax. You may also be asked to do other activities during the test. This information is not intended to replace advice given to you by your health care provider. Make sure you discuss any questions you have with your health care provider. Document Revised: 11/06/2017 Document Reviewed: 11/06/2017 Elsevier Patient Education  2020 [employer].

## 2019-09-24 ENCOUNTER — Other Ambulatory Visit: Payer: Self-pay

## 2019-09-24 ENCOUNTER — Ambulatory Visit (INDEPENDENT_AMBULATORY_CARE_PROVIDER_SITE_OTHER): Payer: Medicare Other | Admitting: Neurology

## 2019-09-24 DIAGNOSIS — R251 Tremor, unspecified: Secondary | ICD-10-CM

## 2019-09-24 DIAGNOSIS — R259 Unspecified abnormal involuntary movements: Secondary | ICD-10-CM

## 2019-09-24 NOTE — Procedures (Signed)
TECHNICAL SUMMARY:  A multichannel referential and bipolar montage EEG using the standard international 10-20 system was performed on the patient described as awake and drowsy.  The dominant background activity consists of 9 hertz activity seen most prominantly over the posterior head region.  The backgound activity is reactive to eye opening and closing procedures.  Low voltage fast (beta) activity is distributed symmetrically and maximally over the anterior head regions.  ACTIVATION:  Stepwise photic stimulation at 4-20 flashes per second was performed and did not elicit any abnormal waveforms.  Hyperventilation was not performed.  EPILEPTIFORM ACTIVITY:  There were no spikes, sharp waves or paroxysmal activity.  SLEEP:  Physiologic drowsiness is noted.  No stage 2 sleep  CARDIAC:  The EKG lead revealed a regular sinus rhythm.  IMPRESSION:  This is a normal EEG for the patients stated age.  There were no focal, hemispheric or lateralizing features.  No epileptiform activity was recorded.  A normal EEG does not exclude the diagnosis of a seizure disorder and if seizure remains high on the list of differential diagnosis, an ambulatory EEG may be of value.  Clinical correlation is required.

## 2019-09-25 ENCOUNTER — Other Ambulatory Visit: Payer: Self-pay

## 2019-09-25 DIAGNOSIS — R3129 Other microscopic hematuria: Secondary | ICD-10-CM | POA: Diagnosis not present

## 2019-09-25 DIAGNOSIS — R251 Tremor, unspecified: Secondary | ICD-10-CM

## 2019-09-25 DIAGNOSIS — E1122 Type 2 diabetes mellitus with diabetic chronic kidney disease: Secondary | ICD-10-CM | POA: Diagnosis not present

## 2019-09-25 DIAGNOSIS — R809 Proteinuria, unspecified: Secondary | ICD-10-CM | POA: Diagnosis not present

## 2019-09-25 DIAGNOSIS — N181 Chronic kidney disease, stage 1: Secondary | ICD-10-CM | POA: Diagnosis not present

## 2019-09-25 DIAGNOSIS — R259 Unspecified abnormal involuntary movements: Secondary | ICD-10-CM

## 2019-09-25 DIAGNOSIS — I129 Hypertensive chronic kidney disease with stage 1 through stage 4 chronic kidney disease, or unspecified chronic kidney disease: Secondary | ICD-10-CM | POA: Diagnosis not present

## 2019-10-06 ENCOUNTER — Encounter: Payer: Self-pay | Admitting: Family Medicine

## 2019-10-06 ENCOUNTER — Telehealth (INDEPENDENT_AMBULATORY_CARE_PROVIDER_SITE_OTHER): Payer: Medicare Other | Admitting: Family Medicine

## 2019-10-06 ENCOUNTER — Other Ambulatory Visit: Payer: Self-pay

## 2019-10-06 VITALS — BP 120/76 | HR 86

## 2019-10-06 DIAGNOSIS — J0141 Acute recurrent pansinusitis: Secondary | ICD-10-CM

## 2019-10-06 MED ORDER — FLUTICASONE PROPIONATE 50 MCG/ACT NA SUSP: 2.0000 | Freq: Every day | NASAL | 6 refills | Status: DC

## 2019-10-06 MED ORDER — CEFDINIR 300 MG PO CAPS: 300.0000 mg | ORAL_CAPSULE | Freq: Two times a day (BID) | ORAL | 0 refills | Status: DC

## 2019-10-06 NOTE — Progress Notes (Signed)
Subjective:    Patient ID: Megan [name], female    DOB: 10-Nov-1955, 64 y.o.   MRN: [id number]  No chief complaint on file.   HPI Patient is in today for sinus symptoms --- she was seen in her car    This was not a video visit  Pt c/o sinus congestion and pressure  --- she is leaving for [place] this weekend.  Symptoms x 1 week    No fever She is using afrin and zyrtec with little relief   Past Medical History:  Diagnosis Date   Allergy    Arthritis    fingers   Colon polyp    Diabetes mellitus    Diverticulosis    GERD (gastroesophageal reflux disease)    occasionally   H/O hiatal hernia    ?   Hemorrhoids    Hyperlipidemia    takes fish oil   Hypertension    Infectious colitis    Ventral hernia     Past Surgical History:  Procedure Laterality Date   colonscopy     DIAGNOSTIC LAPAROSCOPY     FOOT SURGERY Bilateral    hammer toes   HEMORRHOID SURGERY     HERNIA REPAIR  01/20/2011   incisional   INCISIONAL HERNIA REPAIR  01/20/2011   Procedure: LAPAROSCOPIC INCISIONAL HERNIA;  Surgeon: Judieth Keens, DO;  Location: [facility];  Service: General;  Laterality: N/A;  Laparoscopic repair of a recurrent incisional hernia with Mesh   INGUINAL HERNIA REPAIR     x's 3   VENTRAL HERNIA REPAIR      Family History  Problem Relation Age of Onset   Breast cancer Mother    Diabetes Mother    Dementia Mother    Liver cancer Mother    Heart disease Father        heart attack   Diabetes Brother    Breast cancer Sister    Colon cancer Neg Hx    Stomach cancer Neg Hx     Social History   Socioeconomic History   Marital status: Divorced    Spouse name: Not on file   Number of children: 1   Years of education: Not on file   Highest education level: Not on file  Occupational History   Occupation: assessment specialist    Comment: [employer] [place]  Tobacco Use   Smoking status: Former Smoker    Packs/day: 1.00    Years:  20.00    Pack years: 20.00    Types: Cigarettes    Quit date: 01/05/1996    Years since quitting: 23.7   Smokeless tobacco: Never Used  Vaping Use   Vaping Use: Never used  Substance and Sexual Activity   Alcohol use: Not Currently    Alcohol/week: 4.0 standard drinks    Types: 4 Glasses of wine per week   Drug use: No   Sexual activity: Not on file  Other Topics Concern   Not on file  Social History Narrative   Exercise 3 times a week   Social Determinants of Health   Financial Resource Strain:    Difficulty of Paying Living Expenses: Not on file  Food Insecurity:    Worried About Charity fundraiser in the Last Year: Not on file   [employer] of Food in the Last Year: Not on file  Transportation Needs:    Lack of Transportation (Medical): Not on file   Lack of Transportation (Non-Medical): Not on file  Physical Activity:  Days of Exercise per Week: Not on file   Minutes of Exercise per Session: Not on file  Stress:    Feeling of Stress : Not on file  Social Connections:    Frequency of Communication with Friends and Family: Not on file   Frequency of Social Gatherings with Friends and Family: Not on file   Attends Religious Services: Not on file   Active Member of Clubs or Organizations: Not on file   Attends Archivist Meetings: Not on file   Marital Status: Not on file  Intimate Partner Violence:    Fear of Current or Ex-Partner: Not on file   Emotionally Abused: Not on file   Physically Abused: Not on file   Sexually Abused: Not on file    Outpatient Medications Prior to Visit  Medication Sig Dispense Refill   amLODipine (NORVASC) 5 MG tablet Take 1 tablet (5 mg total) by mouth daily. 90 tablet 1   b complex vitamins tablet Take 1 tablet by mouth daily.     Cholecalciferol (VITAMIN D3) 250 MCG (10000 UT) TABS Take by mouth daily.     Fish Oil-Cholecalciferol (FISH OIL + D3 PO) Take 1,200 mg by mouth daily.       fluticasone (FLONASE) 50 MCG/ACT nasal spray Place 2 sprays into both nostrils daily. 16 g 6   glimepiride (AMARYL) 4 MG tablet Take 1.5 tablets (6 mg total) by mouth daily before breakfast. 45 tablet 3   loratadine (CLARITIN) 10 MG tablet Take 1 tablet (10 mg total) by mouth daily. 30 tablet 11   losartan (COZAAR) 100 MG tablet TK 1 T PO D     montelukast (SINGULAIR) 10 MG tablet Take 1 tablet (10 mg total) by mouth at bedtime. 30 tablet 3   Multiple Vitamin (MULTIVITAMIN) tablet Take 1 tablet by mouth daily. OVER 50 VITAMIN     sitaGLIPtin-metformin (JANUMET) 50-1000 MG tablet TAKE 1 TABLET BY MOUTH TWICE DAILY WITH A MEAL 180 tablet 1   traZODone (DESYREL) 50 MG tablet Take 0.5-1 tablets (25-50 mg total) by mouth at bedtime as needed for sleep. 30 tablet 3   No facility-administered medications prior to visit.    Allergies  Allergen Reactions   Aspirin Nausea Only    Review of Systems  Constitutional: Negative for chills, fever and malaise/fatigue.  HENT: Positive for congestion and sinus pain. Negative for hearing loss and sore throat.   Eyes: Negative for discharge.  Respiratory: Positive for cough. Negative for sputum production and shortness of breath.   Cardiovascular: Negative for chest pain, palpitations and leg swelling.  Gastrointestinal: Negative for abdominal pain, blood in stool, constipation, diarrhea, heartburn, nausea and vomiting.  Genitourinary: Negative for dysuria, frequency, hematuria and urgency.  Musculoskeletal: Negative for back pain, falls and myalgias.  Skin: Negative for rash.  Neurological: Negative for dizziness, sensory change, loss of consciousness, weakness and headaches.  Endo/Heme/Allergies: Negative for environmental allergies. Does not bruise/bleed easily.  Psychiatric/Behavioral: Negative for depression and suicidal ideas. The patient is not nervous/anxious and does not have insomnia.        Objective:    Physical Exam Vitals and  nursing note reviewed.  Constitutional:      Appearance: She is well-developed. She is not diaphoretic.  HENT:     Nose: Mucosal edema and rhinorrhea present. No nasal deformity.     Right Sinus: Maxillary sinus tenderness and frontal sinus tenderness present.     Left Sinus: Maxillary sinus tenderness and frontal sinus tenderness present.  Mouth/Throat:     Pharynx: No oropharyngeal exudate.  Eyes:     General:        Right eye: No discharge.        Left eye: No discharge.     Conjunctiva/sclera: Conjunctivae normal.  Cardiovascular:     Rate and Rhythm: Normal rate and regular rhythm.     Heart sounds: Normal heart sounds. No murmur heard.   Pulmonary:     Effort: Pulmonary effort is normal. No respiratory distress.     Breath sounds: Normal breath sounds. No wheezing or rales.  Chest:     Chest wall: No tenderness.  Musculoskeletal:     Cervical back: Normal range of motion and neck supple.  Lymphadenopathy:     Cervical: No cervical adenopathy.  Skin:    General: Skin is warm.  Neurological:     Mental Status: She is alert and oriented to person, place, and time.     BP 120/76    Pulse 86    SpO2 94%  Wt Readings from Last 3 Encounters:  09/15/19 184 lb (83.5 kg)  09/02/19 186 lb 9.6 oz (84.6 kg)  06/09/19 186 lb 6.4 oz (84.6 kg)     Lab Results  Component Value Date   WBC 9.3 11/05/2018   HGB 11.6 (L) 11/05/2018   HCT 36.6 11/05/2018   PLT 369.0 11/05/2018   GLUCOSE 131 (H) 07/14/2019   CHOL 149 07/14/2019   TRIG 150.0 (H) 07/14/2019   HDL 45.80 07/14/2019   LDLDIRECT 88.0 04/21/2014   LDLCALC 73 07/14/2019   ALT 41 (H) 07/14/2019   AST 30 07/14/2019   NA 139 07/14/2019   K 4.2 07/14/2019   CL 103 07/14/2019   CREATININE 0.79 07/14/2019   BUN 16 07/14/2019   CO2 27 07/14/2019   TSH 0.75 11/05/2018   HGBA1C 7.3 (H) 07/14/2019   MICROALBUR 0.8 07/14/2019    Lab Results  Component Value Date   TSH 0.75 11/05/2018   Lab Results  Component  Value Date   WBC 9.3 11/05/2018   HGB 11.6 (L) 11/05/2018   HCT 36.6 11/05/2018   MCV 67.8 Repeated and verified X2. (L) 11/05/2018   PLT 369.0 11/05/2018   Lab Results  Component Value Date   NA 139 07/14/2019   K 4.2 07/14/2019   CO2 27 07/14/2019   GLUCOSE 131 (H) 07/14/2019   BUN 16 07/14/2019   CREATININE 0.79 07/14/2019   BILITOT 0.6 07/14/2019   ALKPHOS 61 07/14/2019   AST 30 07/14/2019   ALT 41 (H) 07/14/2019   PROT 8.0 07/14/2019   ALBUMIN 4.3 07/14/2019   CALCIUM 10.2 07/14/2019   GFR 88.02 07/14/2019   Lab Results  Component Value Date   CHOL 149 07/14/2019   Lab Results  Component Value Date   HDL 45.80 07/14/2019   Lab Results  Component Value Date   LDLCALC 73 07/14/2019   Lab Results  Component Value Date   TRIG 150.0 (H) 07/14/2019   Lab Results  Component Value Date   CHOLHDL 3 07/14/2019   Lab Results  Component Value Date   HGBA1C 7.3 (H) 07/14/2019       Assessment & Plan:   Problem List Items Addressed This Visit    None    Visit Diagnoses    Acute recurrent pansinusitis    -  Primary   Relevant Medications   cefdinir (OMNICEF) 300 MG capsule   fluticasone (FLONASE) 50 MCG/ACT nasal spray   Other  Relevant Orders   Novel Coronavirus, NAA ([facility])    if test is neg we can then get the pt into the office for further eval  I am having [name] start on cefdinir and fluticasone. I am also having her maintain her multivitamin, Fish Oil-Cholecalciferol (FISH OIL + D3 PO), montelukast, fluticasone, loratadine, losartan, traZODone, amLODipine, Janumet, b complex vitamins, Vitamin D3, and glimepiride.  Meds ordered this encounter  Medications   cefdinir (OMNICEF) 300 MG capsule    Sig: Take 1 capsule (300 mg total) by mouth 2 (two) times daily.    Dispense:  20 capsule    Refill:  0   fluticasone (FLONASE) 50 MCG/ACT nasal spray    Sig: Place 2 sprays into both nostrils daily.    Dispense:  16 g    Refill:  [place], DO

## 2019-10-09 ENCOUNTER — Encounter: Payer: Self-pay | Admitting: Neurology

## 2019-10-09 LAB — NOVEL CORONAVIRUS, NAA: SARS-CoV-2, NAA: NOT DETECTED

## 2019-10-09 LAB — SARS-COV-2, NAA 2 DAY TAT

## 2019-10-09 NOTE — Progress Notes (Addendum)
Megan [name] Key: [id number] - PA Case ID: [id number] Need help? Call us at [phone] Outcome Approvedon September 23 Request Reference Number: [id number]. BOTOX INJ 200UNIT is approved through 01/08/2020. Your patient may now fill this prescription and it will be covered. Drug Botox 200UNIT solution Form [employer] Medicare Part D Electronic Prior Authorization Form (2017 NCPDP)

## 2019-10-10 ENCOUNTER — Telehealth: Payer: Self-pay | Admitting: Neurology

## 2019-10-10 NOTE — Telephone Encounter (Signed)
I agree with the patient.  This patient didn't need botox.  Are we sure that this was not a mistake

## 2019-10-10 NOTE — Telephone Encounter (Signed)
Patient called back about Botox and said she knew nothing about this. She doesn't know why she needs to do the injections. She would like to speak with the nurse regarding this. Please call her back at [phone]. Thanks!

## 2019-10-10 NOTE — Telephone Encounter (Signed)
Called patient and left a message informing her that she does not need botox and apologizing for the miscommunication. advised a call back with any questions or concerns.

## 2019-10-29 ENCOUNTER — Ambulatory Visit: Payer: Medicare Other | Admitting: Internal Medicine

## 2019-11-04 ENCOUNTER — Other Ambulatory Visit: Payer: Self-pay

## 2019-11-04 ENCOUNTER — Ambulatory Visit (HOSPITAL_COMMUNITY)
Admission: EM | Admit: 2019-11-04 | Discharge: 2019-11-04 | Disposition: A | Discharge status: Home / Self Care | Payer: Medicare Other | Attending: Family Medicine | Admitting: Family Medicine

## 2019-11-04 ENCOUNTER — Other Ambulatory Visit: Payer: Medicare Other

## 2019-11-04 ENCOUNTER — Ambulatory Visit: Payer: Medicare Other | Admitting: Internal Medicine

## 2019-11-04 ENCOUNTER — Encounter (HOSPITAL_COMMUNITY): Payer: Self-pay | Admitting: *Deleted

## 2019-11-04 ENCOUNTER — Ambulatory Visit (INDEPENDENT_AMBULATORY_CARE_PROVIDER_SITE_OTHER): Payer: Medicare Other

## 2019-11-04 DIAGNOSIS — J069 Acute upper respiratory infection, unspecified: Secondary | ICD-10-CM | POA: Diagnosis not present

## 2019-11-04 DIAGNOSIS — Z20822 Contact with and (suspected) exposure to covid-19: Secondary | ICD-10-CM

## 2019-11-04 DIAGNOSIS — R059 Cough, unspecified: Secondary | ICD-10-CM | POA: Diagnosis not present

## 2019-11-04 LAB — SARS CORONAVIRUS 2 (TAT 6-24 HRS): SARS Coronavirus 2: NEGATIVE

## 2019-11-04 MED ORDER — DM-GUAIFENESIN ER 30-600 MG PO TB12: 1.0000 | ORAL_TABLET | Freq: Two times a day (BID) | ORAL | 0 refills | Status: DC

## 2019-11-04 MED ORDER — HYDROCODONE-HOMATROPINE 5-1.5 MG/5ML PO SYRP: 5.0000 mL | ORAL_SOLUTION | Freq: Four times a day (QID) | ORAL | 0 refills | Status: DC | PRN

## 2019-11-04 NOTE — Discharge Instructions (Addendum)
You have been tested for COVID-19 today. If your test returns positive, you will receive a phone call from [facility] regarding your results. Negative test results are not called. Both positive and negative results area always visible on MyChart. If you do not have a MyChart account, sign up instructions are provided in your discharge papers. Please do not hesitate to contact us should you have questions or concerns.  Be aware, your cough medication may cause drowsiness. Please do not drive, operate heavy machinery or make important decisions while on this medication, it can cloud your judgement.

## 2019-11-04 NOTE — ED Triage Notes (Addendum)
Patient in complaints of "croupy " cough x 1 week. Patient states that she is experiencing chest soreness in the middle of chest from coughing. Patient states that cough is non productive. Patient states that she feels like she needs to cough something up but is unable to get it to come up. Patient denies fever.

## 2019-11-04 NOTE — ED Provider Notes (Signed)
[facility]   [id number] 11/04/19 Arrival Time: 6967  ASSESSMENT & PLAN:  1. Encounter for screening laboratory testing for COVID-19 virus   2. Viral URI with cough      COVID-19 testing sent.  I have personally viewed the imaging studies ordered this visit. No PNA.   Meds ordered this encounter  Medications  . HYDROcodone-homatropine (HYCODAN) 5-1.5 MG/5ML syrup    Sig: Take 5 mLs by mouth every 6 (six) hours as needed for cough.    Dispense:  90 mL    Refill:  0  . dextromethorphan-guaiFENesin (MUCINEX DM) 30-600 MG 12hr tablet    Sig: Take 1 tablet by mouth 2 (two) times daily.    Dispense:  30 tablet    Refill:  0     Follow-up Information    [name], DO.   Specialty: Family Medicine Why: If worsening or failing to improve as anticipated. Contact information: [address] [place] [address] [phone]        [facility].   Specialty: Urgent Care Contact information: [place]              Reviewed expectations re: course of current medical issues. Questions answered. Outlined signs and symptoms indicating need for more acute intervention. Understanding verbalized. After Visit Summary given.   SUBJECTIVE: History from: patient. Megan [name] is a 64 y.o. female who reports URI symptoms over the past week; around sick grandchildren in [place]. Persistent coughing with thick mucous. Also with throat irritation/dryness. No specific SOB/wheezing reported. Known COVID-19 contact: none. Recent travel: [place]. Denies: fever and difficulty breathing. Normal PO intake without n/v/d.    OBJECTIVE:  Vitals:   11/04/19 1105  BP: 128/73  Pulse: 83  Resp: 20  Temp: 98.9 F (37.2 C)  TempSrc: Oral  SpO2: 99%    General appearance: alert; no distress Eyes: PERRLA; EOMI; conjunctiva normal HENT: [place]; AT; with nasal  congestion; throat cobblestoning Neck: supple  Lungs: speaks full sentences without difficulty; unlabored; CTAB Extremities: no edema Skin: warm and dry Neurologic: normal gait Psychological: alert and cooperative; normal mood and affect  Labs:  Labs Reviewed  SARS CORONAVIRUS 2 (TAT 6-24 HRS)    Imaging: DG Chest 2 View  Result Date: 11/04/2019 CLINICAL DATA:  Cough. EXAM: CHEST - 2 VIEW COMPARISON:  Chest X 04/30/2017.  Thyroid ultrasound 05/30/2018. FINDINGS: Stable shift of the trachea to the left consistent with none right thyroid lobe mass. Heart size normal. Lungs are clear. No pleural effusion or pneumothorax. Degenerative change thoracic spine. IMPRESSION: 1. No acute cardiopulmonary disease. 2. 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 Reference is made to prior thyroid ultrasound report of 05/30/2018 Electronically Signed   By: Toyah   On: 11/04/2019 11:34    Allergies  Allergen Reactions  . Aspirin Nausea Only    Past Medical History:  Diagnosis Date  . Allergy   . Arthritis    fingers  . Colon polyp   . Diabetes mellitus   . Diverticulosis   . GERD (gastroesophageal reflux disease)    occasionally  . H/O hiatal hernia    ?  Marland Kitchen Hemorrhoids   . Hyperlipidemia    takes fish oil  . Hypertension   . Infectious colitis   . Ventral hernia    Social History   Socioeconomic History  . Marital  status: Divorced    Spouse name: Not on file  . Number of children: 1  . Years of education: Not on file  . Highest education level: Not on file  Occupational History  . Occupation: assessment specialist    Comment: [employer] [place]  Tobacco Use  . Smoking status: Former Smoker    Packs/day: 1.00    Years: 20.00    Pack years: 20.00    Types: Cigarettes    Quit date: 01/05/1996    Years since quitting: 23.8  . Smokeless tobacco: Never Used  Vaping Use  . Vaping Use: Never used  Substance and Sexual Activity   . Alcohol use: Not Currently    Alcohol/week: 4.0 standard drinks    Types: 4 Glasses of wine per week  . Drug use: No  . Sexual activity: Not on file  Other Topics Concern  . Not on file  Social History Narrative   Exercise 3 times a week   Social Determinants of Health   Financial Resource Strain:   . Difficulty of Paying Living Expenses: Not on file  Food Insecurity:   . Worried About Charity fundraiser in the Last Year: Not on file  . Ran Out of Food in the Last Year: Not on file  Transportation Needs:   . Lack of Transportation (Medical): Not on file  . Lack of Transportation (Non-Medical): Not on file  Physical Activity:   . Days of Exercise per Week: Not on file  . Minutes of Exercise per Session: Not on file  Stress:   . Feeling of Stress : Not on file  Social Connections:   . Frequency of Communication with Friends and Family: Not on file  . Frequency of Social Gatherings with Friends and Family: Not on file  . Attends Religious Services: Not on file  . Active Member of Clubs or Organizations: Not on file  . Attends Archivist Meetings: Not on file  . Marital Status: Not on file  Intimate Partner Violence:   . Fear of Current or Ex-Partner: Not on file  . Emotionally Abused: Not on file  . Physically Abused: Not on file  . Sexually Abused: Not on file   Family History  Problem Relation Age of Onset  . Breast cancer Mother   . Diabetes Mother   . Dementia Mother   . Liver cancer Mother   . Heart disease Father        heart attack  . Diabetes Brother   . Breast cancer Sister   . Colon cancer Neg Hx   . Stomach cancer Neg Hx    Past Surgical History:  Procedure Laterality Date  . colonscopy    . DIAGNOSTIC LAPAROSCOPY    . FOOT SURGERY Bilateral    hammer toes  . HEMORRHOID SURGERY    . HERNIA REPAIR  01/20/2011   incisional  . INCISIONAL HERNIA REPAIR  01/20/2011   Procedure: LAPAROSCOPIC INCISIONAL HERNIA;  Surgeon: Judieth Keens,  DO;  Location: [facility];  Service: General;  Laterality: N/A;  Laparoscopic repair of a recurrent incisional hernia with Mesh  . INGUINAL HERNIA REPAIR     x's 3  . VENTRAL HERNIA REPAIR       Vanessa Kick, MD 11/04/19 1234

## 2019-11-11 ENCOUNTER — Ambulatory Visit (INDEPENDENT_AMBULATORY_CARE_PROVIDER_SITE_OTHER): Payer: Medicare Other | Admitting: Internal Medicine

## 2019-11-11 ENCOUNTER — Encounter: Payer: Self-pay | Admitting: Internal Medicine

## 2019-11-11 ENCOUNTER — Other Ambulatory Visit: Payer: Self-pay

## 2019-11-11 VITALS — BP 130/82 | HR 82 | Ht 65.0 in | Wt 178.4 lb

## 2019-11-11 DIAGNOSIS — E119 Type 2 diabetes mellitus without complications: Secondary | ICD-10-CM

## 2019-11-11 LAB — POCT GLYCOSYLATED HEMOGLOBIN (HGB A1C): Hemoglobin A1C: 5.8 % — AB (ref 4.0–5.6)

## 2019-11-11 MED ORDER — GLIMEPIRIDE 2 MG PO TABS: 4.0000 mg | ORAL_TABLET | Freq: Every day | ORAL | 6 refills | Status: DC

## 2019-11-11 NOTE — Progress Notes (Signed)
Name: Megan [name]  Age/ Sex: 64 y.o., female   MRN/ DOB: [id number], 1955/05/31     PCP: [name], DO   Reason for Endocrinology Evaluation: Type 2 Diabetes Mellitus  Initial Endocrine Consultative Visit: 09/02/2019    PATIENT IDENTIFIER: [name] is a 64 y.o. female with a past medical history of T2DM, HTN and Dyslipidemia. The patient has followed with Endocrinology clinic since 09/02/2019 for consultative assistance with management of Megan [name] diabetes.  DIABETIC HISTORY:  Megan [name] was diagnosed with DM in 2008.She has been on Metformin, Glimepiride, repaglinide and Januvia . Megan [name] hemoglobin A1c has ranged from 6.5% in 2014, peaking at 7.4% in 10/2018  On Megan [name] initial visit to our clinic she has an A1c of 7.3% . She was on Repaglinide which we stopped due to hypoglycemia, Decreased Glimepiride and continued Janumet.   SUBJECTIVE:   During the last visit (09/02/2019): A1c 7.3 % We stopped Repaglinide due to hypoglycemia. Continue Janumet and decreased Glimepiride   Today (11/11/2019): Megan [name] is here for a follow up on diabetes management.  She checks Megan [name] blood sugars 1times daily, preprandial to breakfast. The patient has not had hypoglycemic episodes since the last clinic visit.    She is having cough and congestion. OTC mucinex  and hydromet have not been helping for the past week.   HOME DIABETES REGIMEN:  Janumet 50-1000 mg BID Glimepiride 4 mg , 1.5 tablets before Breakfast     Statin: Declines  ACE-I/ARB: yes    METER DOWNLOAD SUMMARY: Unable to download  79- 172  Mg/dL    DIABETIC COMPLICATIONS: Microvascular complications:    Denies: CKD, retinopathy, neuropathy  Last Eye Exam: Completed 2020  Macrovascular complications:    Denies: CAD, CVA, PVD   HISTORY:  Past Medical History:  Past Medical History:  Diagnosis Date  . Allergy   . Arthritis    fingers  . Colon polyp   . Diabetes mellitus   .  Diverticulosis   . GERD (gastroesophageal reflux disease)    occasionally  . H/O hiatal hernia    ?  Marland Kitchen Hemorrhoids   . Hyperlipidemia    takes fish oil  . Hypertension   . Infectious colitis   . Ventral hernia     Past Surgical History:  Past Surgical History:  Procedure Laterality Date  . colonscopy    . DIAGNOSTIC LAPAROSCOPY    . FOOT SURGERY Bilateral    hammer toes  . HEMORRHOID SURGERY    . HERNIA REPAIR  01/20/2011   incisional  . INCISIONAL HERNIA REPAIR  01/20/2011   Procedure: LAPAROSCOPIC INCISIONAL HERNIA;  Surgeon: Judieth Keens, DO;  Location: [facility];  Service: General;  Laterality: N/A;  Laparoscopic repair of a recurrent incisional hernia with Mesh  . INGUINAL HERNIA REPAIR     x's 3  . VENTRAL HERNIA REPAIR       Social History:  reports that she quit smoking about 23 years ago. Megan [name] smoking use included cigarettes. She has a 20.00 pack-year smoking history. She has never used smokeless tobacco. She reports previous alcohol use of about 4.0 standard drinks of alcohol per week. She reports that she does not use drugs. Family History:  Family History  Problem Relation Age of Onset  . Breast cancer Mother   . Diabetes Mother   . Dementia Mother   . Liver cancer Mother   . Heart disease Father  heart attack  . Diabetes Brother   . Breast cancer Sister   . Colon cancer Neg Hx   . Stomach cancer Neg Hx       HOME MEDICATIONS: Allergies as of 11/11/2019      Reactions   Aspirin Nausea Only      Medication List       Accurate as of November 11, 2019  9:23 AM. If you have any questions, ask your nurse or doctor.        amLODipine 5 MG tablet Commonly known as: NORVASC Take 1 tablet (5 mg total) by mouth daily.   b complex vitamins tablet Take 1 tablet by mouth daily.   cefdinir 300 MG capsule Commonly known as: OMNICEF Take 1 capsule (300 mg total) by mouth 2 (two) times daily.   dextromethorphan-guaiFENesin 30-600 MG 12hr  tablet Commonly known as: MUCINEX DM Take 1 tablet by mouth 2 (two) times daily.   FISH OIL + D3 PO Take 1,200 mg by mouth daily.   fluticasone 50 MCG/ACT nasal spray Commonly known as: FLONASE Place 2 sprays into both nostrils daily.   fluticasone 50 MCG/ACT nasal spray Commonly known as: FLONASE Place 2 sprays into both nostrils daily.   glimepiride 4 MG tablet Commonly known as: AMARYL Take 1.5 tablets (6 mg total) by mouth daily before breakfast.   HYDROcodone-homatropine 5-1.5 MG/5ML syrup Commonly known as: HYCODAN Take 5 mLs by mouth every 6 (six) hours as needed for cough.   Janumet 50-1000 MG tablet Generic drug: sitaGLIPtin-metformin TAKE 1 TABLET BY MOUTH TWICE DAILY WITH A MEAL   loratadine 10 MG tablet Commonly known as: CLARITIN Take 1 tablet (10 mg total) by mouth daily.   losartan 100 MG tablet Commonly known as: COZAAR TK 1 T PO D   montelukast 10 MG tablet Commonly known as: SINGULAIR Take 1 tablet (10 mg total) by mouth at bedtime.   multivitamin tablet Take 1 tablet by mouth daily. OVER 50 VITAMIN   traZODone 50 MG tablet Commonly known as: DESYREL Take 0.5-1 tablets (25-50 mg total) by mouth at bedtime as needed for sleep.   Vitamin D3 250 MCG (10000 UT) Tabs Take by mouth daily.        OBJECTIVE:   Vital Signs: BP 130/82   Pulse 82   Ht 5\' 5"  (1.651 m)   Wt 178 lb 6 oz (80.9 kg)   SpO2 98%   BMI 29.68 kg/m   Wt Readings from Last 3 Encounters:  11/11/19 178 lb 6 oz (80.9 kg)  09/15/19 184 lb (83.5 kg)  09/02/19 186 lb 9.6 oz (84.6 kg)     Exam: General: Pt appears well and is in NAD  Hydration: Well-hydrated with moist mucous membranes and good skin turgor  HEENT: Head: Unremarkable with good dentition. Oropharynx clear without exudate.  Eyes: External eye exam normal without stare, lid lag or exophthalmos.  EOM intact.  PERRL.  Neck: General: Supple without adenopathy. Thyroid: Thyroid size normal.  No goiter or nodules  appreciated. No thyroid bruit.  Lungs: Clear with good BS bilat with no rales, rhonchi, or wheezes  Heart: RRR with normal S1 and S2 and no gallops; no murmurs; no rub  Abdomen: Normoactive bowel sounds, soft, nontender, without masses or organomegaly palpable  Extremities: No pretibial edema. No tremor. Normal strength and motion throughout. See detailed diabetic foot exam below.  Skin: Normal texture and temperature to palpation. No rash noted. No Acanthosis nigricans/skin tags. No lipohypertrophy.  Neuro: MS  is good with appropriate affect, pt is alert and Ox3    DM foot exam  09/02/2019 The skin of the feet is intact without sores or ulcerations. The pedal pulses are 2+ on right and 2+ on left. The sensation is intact to a screening 5.07, 10 gram monofilament bilaterally    DATA REVIEWED:  Lab Results  Component Value Date   HGBA1C 5.8 (A) 11/11/2019   HGBA1C 7.3 (H) 07/14/2019   HGBA1C 7.4 (H) 11/05/2018   Lab Results  Component Value Date   MICROALBUR 0.8 07/14/2019   LDLCALC 73 07/14/2019   CREATININE 0.79 07/14/2019   Lab Results  Component Value Date   MICRALBCREAT 2.0 07/14/2019     Lab Results  Component Value Date   CHOL 149 07/14/2019   HDL 45.80 07/14/2019   LDLCALC 73 07/14/2019   LDLDIRECT 88.0 04/21/2014   TRIG 150.0 (H) 07/14/2019   CHOLHDL 3 07/14/2019         ASSESSMENT / PLAN / RECOMMENDATIONS:   1) Type 2 Diabetes Mellitus, Optimally controlled, Without complications - Most recent A1c of 5.8%. Goal A1c < 7.0 %.    - A1c down from 7.3% to 5.8% without evidence of hypoglycemia. I have praised Megan [name] on the lifestyle changes and compliance with medication.  - Will reduce Glimepiride as below    MEDICATIONS: - Decrease Glimepeiride 4 mg, one  tablet before Breakfast  - Continue Janumet 50-1000 mg twice daily   EDUCATION / INSTRUCTIONS:  BG monitoring instructions: Patient is instructed to check Megan [name] blood sugars 1 times a day, fasting  .  Call [facility] if: BG persistently < 70 . I reviewed the Rule of 15 for the treatment of hypoglycemia in detail with the patient. Literature supplied.   2) Diabetic complications:   Eye: Does not have known diabetic retinopathy.   Neuro/ Feet: Does not have known diabetic peripheral neuropathy .   Renal: Patient does not have known baseline CKD. She   is on an ACEI/ARB at present.    3) Dyslipidemia: Patient has declined statins in the past due to fear of side effects. We discussed cardiovascular benefits of statins, will continue to visit this topic in future visits.    F/U in 3 months    Signed electronically by: Mack Guise, MD  [facility]  [facility] [address] [place], [place] [address] Phone: [phone] FAX: [phone]   CC: [address] [place] [address] Phone: [phone]  Fax: [phone]  Return to Endocrinology clinic as below: Future Appointments  Date Time Provider [place]  11/11/2019  9:50 AM Lion Fernandez, Melanie Crazier, MD [facility]  01/21/2020  7:30 AM [facility] EEG TECH [facility] None

## 2019-11-11 NOTE — Patient Instructions (Addendum)
-   Keep up the Good work ! - Decrease Glimepeiride 4 mg, one  tablet before Breakfast  - Continue Janumet 50-1000 mg twice daily       HOW TO TREAT LOW BLOOD SUGARS (Blood sugar LESS THAN 70 MG/DL)  Please follow the RULE OF 15 for the treatment of hypoglycemia treatment (when your (blood sugars are less than 70 mg/dL)    STEP 1: Take 15 grams of carbohydrates when your blood sugar is low, which includes:   3-4 GLUCOSE TABS  OR  3-4 OZ OF JUICE OR REGULAR SODA OR  ONE TUBE OF GLUCOSE GEL     STEP 2: RECHECK blood sugar in 15 MINUTES STEP 3: If your blood sugar is still low at the 15 minute recheck --> then, go back to STEP 1 and treat AGAIN with another 15 grams of carbohydrates.

## 2019-11-12 ENCOUNTER — Other Ambulatory Visit: Payer: Medicare Other

## 2019-11-19 ENCOUNTER — Other Ambulatory Visit: Payer: Self-pay | Admitting: Family Medicine

## 2019-11-25 LAB — HM DIABETES EYE EXAM

## 2019-12-02 ENCOUNTER — Ambulatory Visit: Payer: Medicare Other | Admitting: Neurology

## 2019-12-09 ENCOUNTER — Ambulatory Visit (INDEPENDENT_AMBULATORY_CARE_PROVIDER_SITE_OTHER): Payer: Medicare Other | Admitting: Family

## 2019-12-09 ENCOUNTER — Encounter: Payer: Self-pay | Admitting: Family

## 2019-12-09 ENCOUNTER — Other Ambulatory Visit: Payer: Self-pay

## 2019-12-09 VITALS — BP 160/76 | HR 102 | Temp 99.0°F | Resp 16 | Ht 65.0 in | Wt 180.0 lb

## 2019-12-09 DIAGNOSIS — R300 Dysuria: Secondary | ICD-10-CM | POA: Diagnosis not present

## 2019-12-09 DIAGNOSIS — K625 Hemorrhage of anus and rectum: Secondary | ICD-10-CM

## 2019-12-09 LAB — POC URINALSYSI DIPSTICK (AUTOMATED)
Bilirubin, UA: NEGATIVE
Blood, UA: NEGATIVE
Glucose, UA: NEGATIVE
Nitrite, UA: NEGATIVE
Protein, UA: POSITIVE — AB
Spec Grav, UA: >=1.03 — AB (ref 1.010–1.025)
Urobilinogen, UA: NEGATIVE E.U./dL — AB
pH, UA: 5 (ref 5.0–8.0)

## 2019-12-09 MED ORDER — AMLODIPINE BESYLATE 10 MG PO TABS: 10.0000 mg | ORAL_TABLET | Freq: Every day | ORAL | 1 refills | Status: DC

## 2019-12-09 MED ORDER — CEPHALEXIN 500 MG PO CAPS: 500.0000 mg | ORAL_CAPSULE | Freq: Three times a day (TID) | ORAL | 0 refills | Status: DC

## 2019-12-09 NOTE — Progress Notes (Signed)
Subjective:     Patient ID: Megan [name], female   DOB: 1955/06/22, 64 y.o.   MRN: [id number]  HPI  Patient is a 64 yr old female who presents with several concerns:  Last week had a little bit of blood with wiping after stools.  Sunday she developed dysuria and some suprapubic pain. She purchased and began [place].  She also had prednisone on hand which she thought was an antibiotic so she started taking prednisone. .  Pain resolved.  Reports that she is no longer having blood in her stool. Denies fever.  Review of Systems See HPI  Past Medical History:  Diagnosis Date   Allergy    Arthritis    fingers   Colon polyp    Diabetes mellitus    Diverticulosis    GERD (gastroesophageal reflux disease)    occasionally   H/O hiatal hernia    ?   Hemorrhoids    Hyperlipidemia    takes fish oil   Hypertension    Infectious colitis    Ventral hernia      Social History   Socioeconomic History   Marital status: Divorced    Spouse name: Not on file   Number of children: 1   Years of education: Not on file   Highest education level: Not on file  Occupational History   Occupation: assessment specialist    Comment: [employer] [place]  Tobacco Use   Smoking status: Former Smoker    Packs/day: 1.00    Years: 20.00    Pack years: 20.00    Types: Cigarettes    Quit date: 01/05/1996    Years since quitting: 23.9   Smokeless tobacco: Never Used  Vaping Use   Vaping Use: Never used  Substance and Sexual Activity   Alcohol use: Not Currently    Alcohol/week: 4.0 standard drinks    Types: 4 Glasses of wine per week   Drug use: No   Sexual activity: Not on file  Other Topics Concern   Not on file  Social History Narrative   Exercise 3 times a week   Social Determinants of Health   Financial Resource Strain:    Difficulty of Paying Living Expenses: Not on file  Food Insecurity:    Worried About Charity fundraiser in the Last Year: Not on file    [employer] of Food in the Last Year: Not on file  Transportation Needs:    Lack of Transportation (Medical): Not on file   Lack of Transportation (Non-Medical): Not on file  Physical Activity:    Days of Exercise per Week: Not on file   Minutes of Exercise per Session: Not on file  Stress:    Feeling of Stress : Not on file  Social Connections:    Frequency of Communication with Friends and Family: Not on file   Frequency of Social Gatherings with Friends and Family: Not on file   Attends Religious Services: Not on file   Active Member of Clubs or Organizations: Not on file   Attends Archivist Meetings: Not on file   Marital Status: Not on file  Intimate Partner Violence:    Fear of Current or Ex-Partner: Not on file   Emotionally Abused: Not on file   Physically Abused: Not on file   Sexually Abused: Not on file    Past Surgical History:  Procedure Laterality Date   colonscopy     DIAGNOSTIC LAPAROSCOPY     FOOT SURGERY  Bilateral    hammer toes   HEMORRHOID SURGERY     HERNIA REPAIR  01/20/2011   incisional   INCISIONAL HERNIA REPAIR  01/20/2011   Procedure: LAPAROSCOPIC INCISIONAL HERNIA;  Surgeon: Judieth Keens, DO;  Location: [facility];  Service: General;  Laterality: N/A;  Laparoscopic repair of a recurrent incisional hernia with Mesh   INGUINAL HERNIA REPAIR     x's 3   VENTRAL HERNIA REPAIR      Family History  Problem Relation Age of Onset   Breast cancer Mother    Diabetes Mother    Dementia Mother    Liver cancer Mother    Heart disease Father        heart attack   Diabetes Brother    Breast cancer Sister    Colon cancer Neg Hx    Stomach cancer Neg Hx     Allergies  Allergen Reactions   Aspirin Nausea Only    Current Outpatient Medications on File Prior to Visit  Medication Sig Dispense Refill   b complex vitamins tablet Take 1 tablet by mouth daily.     Cholecalciferol (VITAMIN D3) 250 MCG (10000  UT) TABS Take by mouth daily.     dextromethorphan-guaiFENesin (MUCINEX DM) 30-600 MG 12hr tablet Take 1 tablet by mouth 2 (two) times daily. 30 tablet 0   Fish Oil-Cholecalciferol (FISH OIL + D3 PO) Take 1,200 mg by mouth daily.      fluticasone (FLONASE) 50 MCG/ACT nasal spray Place 2 sprays into both nostrils daily. 16 g 6   glimepiride (AMARYL) 2 MG tablet Take 2 tablets (4 mg total) by mouth daily before breakfast. 60 tablet 6   HYDROcodone-homatropine (HYCODAN) 5-1.5 MG/5ML syrup Take 5 mLs by mouth every 6 (six) hours as needed for cough. 90 mL 0   loratadine (CLARITIN) 10 MG tablet Take 1 tablet (10 mg total) by mouth daily. 30 tablet 11   losartan (COZAAR) 100 MG tablet TK 1 T PO D     montelukast (SINGULAIR) 10 MG tablet Take 1 tablet (10 mg total) by mouth at bedtime. 30 tablet 3   Multiple Vitamin (MULTIVITAMIN) tablet Take 1 tablet by mouth daily. OVER 50 VITAMIN     sitaGLIPtin-metformin (JANUMET) 50-1000 MG tablet TAKE 1 TABLET BY MOUTH TWICE DAILY WITH A MEAL 180 tablet 1   traZODone (DESYREL) 50 MG tablet Take 0.5-1 tablets (25-50 mg total) by mouth at bedtime as needed for sleep. 30 tablet 3   No current facility-administered medications on file prior to visit.    BP (!) 160/76 (BP Location: Left Arm, Patient Position: Sitting, Cuff Size: Small)    Pulse (!) 102    Temp 99 F (37.2 C) (Oral)    Resp 16    Ht 5\' 5"  (1.651 m)    Wt 180 lb (81.6 kg)    SpO2 100%    BMI 29.95 kg/m       Objective:   Physical Exam Exam conducted with a chaperone present.  Constitutional:      Appearance: She is well-developed.  Neck:     Thyroid: No thyromegaly.  Cardiovascular:     Rate and Rhythm: Normal rate and regular rhythm.     Heart sounds: Normal heart sounds. No murmur heard.   Pulmonary:     Effort: Pulmonary effort is normal. No respiratory distress.     Breath sounds: Normal breath sounds. No wheezing.  Abdominal:     Tenderness: There is no abdominal  tenderness.  There is no right CVA tenderness or left CVA tenderness.  Genitourinary:    Rectum: Guaiac result negative. External hemorrhoid present. No mass, tenderness, anal fissure or internal hemorrhoid. Normal anal tone.     Comments: Few small external hemorrhoids noted.   Musculoskeletal:     Cervical back: Neck supple.  Skin:    General: Skin is warm and dry.  Neurological:     Mental Status: She is alert and oriented to person, place, and time.  Psychiatric:        Behavior: Behavior normal.        Thought Content: Thought content normal.        Judgment: Judgment normal.        Assessment:      Plan:     Dysuria- UA notes trace leukocytes. Will initiate keflex and send urine for culture.   Rectal bleed- suspect hemorrhoidal bleed. Heme negative on exam.  Will check CBC. If she has new anemia or if she has recurrence of BRBPR she is to let us know.  Would need GI referral at that time. (Pt had colo 2018 which noted diverticula only- otherwise unremarkable).  BP Readings from Last 3 Encounters:  12/09/19 (!) 169/63  11/11/19 130/82  11/04/19 128/73   This visit occurred during the SARS-CoV-2 public health emergency.  Safety protocols were in place, including screening questions prior to the visit, additional usage of staff PPE, and extensive cleaning of exam room while observing appropriate contact time as indicated for disinfecting solutions.

## 2019-12-09 NOTE — Patient Instructions (Addendum)
Please complete blood work prior to leaving. Begin keflex.  Call if symptoms worsen or if symptoms do not improve. Call if you see blood in the stool again.  Please increase amlodipine to 10mg  once daily for your blood pressure.

## 2019-12-10 ENCOUNTER — Telehealth: Payer: Self-pay | Admitting: Family Medicine

## 2019-12-10 DIAGNOSIS — K625 Hemorrhage of anus and rectum: Secondary | ICD-10-CM

## 2019-12-10 LAB — CBC WITH DIFFERENTIAL/PLATELET
Basophils Absolute: 0.2 10*3/uL — ABNORMAL HIGH (ref 0.0–0.1)
Basophils Relative: 1 % (ref 0.0–3.0)
Eosinophils Absolute: 0.1 10*3/uL (ref 0.0–0.7)
Eosinophils Relative: 0.3 % (ref 0.0–5.0)
HCT: 36.2 % (ref 36.0–46.0)
Hemoglobin: 11.3 g/dL — ABNORMAL LOW (ref 12.0–15.0)
Lymphocytes Relative: 15.5 % (ref 12.0–46.0)
Lymphs Abs: 2.4 10*3/uL (ref 0.7–4.0)
MCHC: 31.2 g/dL (ref 30.0–36.0)
MCV: 69.6 fl — ABNORMAL LOW (ref 78.0–100.0)
Monocytes Absolute: 1 10*3/uL (ref 0.1–1.0)
Monocytes Relative: 6.6 % (ref 3.0–12.0)
Neutro Abs: 11.8 10*3/uL — ABNORMAL HIGH (ref 1.4–7.7)
Neutrophils Relative %: 76.6 % (ref 43.0–77.0)
Platelets: 412 10*3/uL — ABNORMAL HIGH (ref 150.0–400.0)
RBC: 5.21 Mil/uL — ABNORMAL HIGH (ref 3.87–5.11)
RDW: 15.2 % (ref 11.5–15.5)
WBC: 15.4 10*3/uL — ABNORMAL HIGH (ref 4.0–10.5)

## 2019-12-10 LAB — URINE CULTURE
MICRO NUMBER:: 11238753
Result:: NO GROWTH
SPECIMEN QUALITY:: ADEQUATE

## 2019-12-10 NOTE — Telephone Encounter (Signed)
Spoke with patient. Advised that labs have not come back yet

## 2019-12-10 NOTE — Telephone Encounter (Signed)
Patient states she is able to see her lab online. She wants to know what she is been treated for.

## 2019-12-12 NOTE — Telephone Encounter (Signed)
Reviewed labs with patient.  She states she is feeling well- asymptomatic.  Advised OK to d/c keflex as culture is negative.  WBC is elevated, but she was taking prednisone at that time and is afebrile. She is mildly anemic. In the setting of recent rectal bleeding, I advised her that I would like to have her meet with GI. Pt verbalizes understanding.

## 2019-12-26 ENCOUNTER — Other Ambulatory Visit: Payer: Self-pay

## 2019-12-26 ENCOUNTER — Encounter: Payer: Self-pay | Admitting: Family Medicine

## 2019-12-26 ENCOUNTER — Telehealth: Payer: Self-pay | Admitting: Family Medicine

## 2019-12-26 ENCOUNTER — Ambulatory Visit (INDEPENDENT_AMBULATORY_CARE_PROVIDER_SITE_OTHER): Payer: Medicare Other | Admitting: Family Medicine

## 2019-12-26 VITALS — BP 120/70 | HR 88 | Temp 97.8°F | Resp 18 | Ht 65.0 in | Wt 177.8 lb

## 2019-12-26 DIAGNOSIS — I10 Essential (primary) hypertension: Secondary | ICD-10-CM | POA: Diagnosis not present

## 2019-12-26 DIAGNOSIS — E1151 Type 2 diabetes mellitus with diabetic peripheral angiopathy without gangrene: Secondary | ICD-10-CM

## 2019-12-26 DIAGNOSIS — IMO0002 Reserved for concepts with insufficient information to code with codable children: Secondary | ICD-10-CM

## 2019-12-26 MED ORDER — JANUMET 50-1000 MG PO TABS: ORAL_TABLET | ORAL | 1 refills | Status: DC

## 2019-12-26 NOTE — Progress Notes (Signed)
Patient ID: Megan [name], female    DOB: 20-Dec-1955  Age: 64 y.o. MRN: [id number]    Subjective:  Subjective  HPI YVONDA FOUTY presents for f/u htn   No complaints   Review of Systems  Constitutional: Negative for appetite change, diaphoresis, fatigue and unexpected weight change.  Eyes: Negative for pain, redness and visual disturbance.  Respiratory: Negative for cough, chest tightness, shortness of breath and wheezing.   Cardiovascular: Negative for chest pain, palpitations and leg swelling.  Endocrine: Negative for cold intolerance, heat intolerance, polydipsia, polyphagia and polyuria.  Genitourinary: Negative for difficulty urinating, dysuria and frequency.  Neurological: Negative for dizziness, light-headedness, numbness and headaches.    History Past Medical History:  Diagnosis Date  . Allergy   . Arthritis    fingers  . Colon polyp   . Diabetes mellitus   . Diverticulosis   . GERD (gastroesophageal reflux disease)    occasionally  . H/O hiatal hernia    ?  Marland Kitchen Hemorrhoids   . Hyperlipidemia    takes fish oil  . Hypertension   . Infectious colitis   . Ventral hernia     She has a past surgical history that includes Inguinal hernia repair; Hemorrhoid surgery; Ventral hernia repair; Foot surgery (Bilateral); colonscopy; Diagnostic laparoscopy; Incisional hernia repair (01/20/2011); and Hernia repair (01/20/2011).   Her family history includes Breast cancer in her mother and sister; Dementia in her mother; Diabetes in her brother and mother; Heart disease in her father; Liver cancer in her mother.She reports that she quit smoking about 23 years ago. Her smoking use included cigarettes. She has a 20.00 pack-year smoking history. She has never used smokeless tobacco. She reports previous alcohol use of about 4.0 standard drinks of alcohol per week. She reports that she does not use drugs.  Current Outpatient Medications on File Prior to Visit  Medication Sig  Dispense Refill  . amLODipine (NORVASC) 10 MG tablet Take 1 tablet (10 mg total) by mouth daily. 30 tablet 1  . b complex vitamins tablet Take 1 tablet by mouth daily.    . Cholecalciferol (VITAMIN D3) 250 MCG (10000 UT) TABS Take by mouth daily.    Marland Kitchen dextromethorphan-guaiFENesin (MUCINEX DM) 30-600 MG 12hr tablet Take 1 tablet by mouth 2 (two) times daily. 30 tablet 0  . Fish Oil-Cholecalciferol (FISH OIL + D3 PO) Take 1,200 mg by mouth daily.     . fluticasone (FLONASE) 50 MCG/ACT nasal spray Place 2 sprays into both nostrils daily. 16 g 6  . glimepiride (AMARYL) 2 MG tablet Take 2 tablets (4 mg total) by mouth daily before breakfast. 60 tablet 6  . loratadine (CLARITIN) 10 MG tablet Take 1 tablet (10 mg total) by mouth daily. 30 tablet 11  . losartan (COZAAR) 100 MG tablet TK 1 T PO D    . montelukast (SINGULAIR) 10 MG tablet Take 1 tablet (10 mg total) by mouth at bedtime. 30 tablet 3  . Multiple Vitamin (MULTIVITAMIN) tablet Take 1 tablet by mouth daily. OVER 50 VITAMIN    . traZODone (DESYREL) 50 MG tablet Take 0.5-1 tablets (25-50 mg total) by mouth at bedtime as needed for sleep. 30 tablet 3   No current facility-administered medications on file prior to visit.     Objective:  Objective  Physical Exam Vitals and nursing note reviewed.  Constitutional:      Appearance: She is well-developed and well-nourished.  HENT:     Head: Normocephalic and atraumatic.  Eyes:  Extraocular Movements: EOM normal.     Conjunctiva/sclera: Conjunctivae normal.  Neck:     Thyroid: No thyromegaly.     Vascular: No carotid bruit or JVD.  Cardiovascular:     Rate and Rhythm: Normal rate and regular rhythm.     Heart sounds: Normal heart sounds. No murmur heard.   Pulmonary:     Effort: Pulmonary effort is normal. No respiratory distress.     Breath sounds: Normal breath sounds. No wheezing or rales.  Chest:     Chest wall: No tenderness.  Musculoskeletal:        General: No edema.      Cervical back: Normal range of motion and neck supple.  Neurological:     Mental Status: She is alert and oriented to person, place, and time.  Psychiatric:        Mood and Affect: Mood and affect normal.    BP 120/70 (BP Location: Right Arm, Patient Position: Sitting, Cuff Size: Large)   Pulse 88   Temp 97.8 F (36.6 C) (Oral)   Resp 18   Ht 5\' 5"  (1.651 m)   Wt 177 lb 12.8 oz (80.6 kg)   SpO2 98%   BMI 29.59 kg/m  Wt Readings from Last 3 Encounters:  12/26/19 177 lb 12.8 oz (80.6 kg)  12/09/19 180 lb (81.6 kg)  11/11/19 178 lb 6 oz (80.9 kg)     Lab Results  Component Value Date   WBC 15.4 (H) 12/09/2019   HGB 11.3 (L) 12/09/2019   HCT 36.2 12/09/2019   PLT 412.0 (H) 12/09/2019   GLUCOSE 131 (H) 07/14/2019   CHOL 149 07/14/2019   TRIG 150.0 (H) 07/14/2019   HDL 45.80 07/14/2019   LDLDIRECT 88.0 04/21/2014   LDLCALC 73 07/14/2019   ALT 41 (H) 07/14/2019   AST 30 07/14/2019   NA 139 07/14/2019   K 4.2 07/14/2019   CL 103 07/14/2019   CREATININE 0.79 07/14/2019   BUN 16 07/14/2019   CO2 27 07/14/2019   TSH 0.75 11/05/2018   HGBA1C 5.8 (A) 11/11/2019   MICROALBUR 0.8 07/14/2019    DG Chest 2 View  Result Date: 11/04/2019 CLINICAL DATA:  Cough. EXAM: CHEST - 2 VIEW COMPARISON:  Chest X 04/30/2017.  Thyroid ultrasound 05/30/2018. FINDINGS: Stable shift of the trachea to the left consistent with none right thyroid lobe mass. Heart size normal. Lungs are clear. No pleural effusion or pneumothorax. Degenerative change thoracic spine. IMPRESSION: 1. No acute cardiopulmonary disease. 2. 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 Reference is made to prior thyroid ultrasound report of 05/30/2018 Electronically Signed   By: [place]   On: 11/04/2019 11:34     Assessment & Plan:  Plan  I have discontinued Claria Dice. Schmid's HYDROcodone-homatropine and cephALEXin. I am also having her maintain her multivitamin, Fish  Oil-Cholecalciferol (FISH OIL + D3 PO), montelukast, fluticasone, loratadine, losartan, traZODone, b complex vitamins, Vitamin D3, dextromethorphan-guaiFENesin, glimepiride, amLODipine, and Janumet.  No orders of the defined types were placed in this encounter.   Problem List Items Addressed This Visit   None   Visit Diagnoses    Primary hypertension    -  Primary   Relevant Orders   Lipid panel   Comprehensive metabolic panel    Well controlled, no changes to meds. Encouraged heart healthy diet such as the DASH diet and exercise as tolerated.    Follow-up: Return in about 6 months (around 06/25/2020) for  annual exam, fasting----needs labs next month.  [name], DO

## 2019-12-26 NOTE — Telephone Encounter (Signed)
Refill sent.

## 2019-12-26 NOTE — Telephone Encounter (Signed)
Medication:sitaGLIPtin-metformin (JANUMET) 50-1000 MG tablet [[id number]]    Has the patient contacted their pharmacy? No. (If no, request that the patient contact the pharmacy for the refill.) (If yes, when and what did the pharmacy advise?)  Preferred Pharmacy (with phone number or street name):  [facility] [place], [place] - [place] AT [facility]  [address] [place] [address]  Phone:  [phone] Fax:  [phone]  DEA #:  [id number]  [place] Reason: --     Agent: Please be advised that RX refills may take up to 3 business days. We ask that you follow-up with your pharmacy.

## 2019-12-26 NOTE — Addendum Note (Signed)
Addended by: Sanda Linger on: 12/26/2019 08:48 AM   Modules accepted: Orders

## 2019-12-26 NOTE — Patient Instructions (Signed)
DASH Eating Plan DASH stands for "Dietary Approaches to Stop Hypertension." The DASH eating plan is a healthy eating plan that has been shown to reduce high blood pressure (hypertension). It may also reduce your risk for type 2 diabetes, heart disease, and stroke. The DASH eating plan may also help with weight loss. What are tips for following this plan?  General guidelines  Avoid eating more than 2,300 mg (milligrams) of salt (sodium) a day. If you have hypertension, you may need to reduce your sodium intake to 1,500 mg a day.  Limit alcohol intake to no more than 1 drink a day for nonpregnant women and 2 drinks a day for men. One drink equals 12 oz of beer, 5 oz of wine, or 1 oz of hard liquor.  Work with your health care provider to maintain a healthy body weight or to lose weight. Ask what an ideal weight is for you.  Get at least 30 minutes of exercise that causes your heart to beat faster (aerobic exercise) most days of the week. Activities may include walking, swimming, or biking.  Work with your health care provider or diet and nutrition specialist (dietitian) to adjust your eating plan to your individual calorie needs. Reading food labels   Check food labels for the amount of sodium per serving. Choose foods with less than 5 percent of the Daily Value of sodium. Generally, foods with less than 300 mg of sodium per serving fit into this eating plan.  To find whole grains, look for the word "whole" as the first word in the ingredient list. Shopping  Buy products labeled as "low-sodium" or "no salt added."  Buy fresh foods. Avoid canned foods and premade or frozen meals. Cooking  Avoid adding salt when cooking. Use salt-free seasonings or herbs instead of table salt or sea salt. Check with your health care provider or pharmacist before using salt substitutes.  Do not fry foods. Cook foods using healthy methods such as baking, boiling, grilling, and broiling instead.  Cook with  heart-healthy oils, such as olive, canola, soybean, or sunflower oil. Meal planning  Eat a balanced diet that includes: ? 5 or more servings of fruits and vegetables each day. At each meal, try to fill half of your plate with fruits and vegetables. ? Up to 6-8 servings of whole grains each day. ? Less than 6 oz of lean meat, poultry, or fish each day. A 3-oz serving of meat is about the same size as a deck of cards. One egg equals 1 oz. ? 2 servings of low-fat dairy each day. ? A serving of nuts, seeds, or beans 5 times each week. ? Heart-healthy fats. Healthy fats called Omega-3 fatty acids are found in foods such as flaxseeds and coldwater fish, like sardines, salmon, and mackerel.  Limit how much you eat of the following: ? Canned or prepackaged foods. ? Food that is high in trans fat, such as fried foods. ? Food that is high in saturated fat, such as fatty meat. ? Sweets, desserts, sugary drinks, and other foods with added sugar. ? Full-fat dairy products.  Do not salt foods before eating.  Try to eat at least 2 vegetarian meals each week.  Eat more home-cooked food and less restaurant, buffet, and fast food.  When eating at a restaurant, ask that your food be prepared with less salt or no salt, if possible. What foods are recommended? The items listed may not be a complete list. Talk with your dietitian about   what dietary choices are best for you. Grains Whole-grain or whole-wheat bread. Whole-grain or whole-wheat pasta. Brown rice. Oatmeal. Quinoa. Bulgur. Whole-grain and low-sodium cereals. Pita bread. Low-fat, low-sodium crackers. Whole-wheat flour tortillas. Vegetables Fresh or frozen vegetables (raw, steamed, roasted, or grilled). Low-sodium or reduced-sodium tomato and vegetable juice. Low-sodium or reduced-sodium tomato sauce and tomato paste. Low-sodium or reduced-sodium canned vegetables. Fruits All fresh, dried, or frozen fruit. Canned fruit in natural juice (without  added sugar). Meat and other protein foods Skinless chicken or turkey. Ground chicken or turkey. Pork with fat trimmed off. Fish and seafood. Egg whites. Dried beans, peas, or lentils. Unsalted nuts, nut butters, and seeds. Unsalted canned beans. Lean cuts of beef with fat trimmed off. Low-sodium, lean deli meat. Dairy Low-fat (1%) or fat-free (skim) milk. Fat-free, low-fat, or reduced-fat cheeses. Nonfat, low-sodium ricotta or cottage cheese. Low-fat or nonfat yogurt. Low-fat, low-sodium cheese. Fats and oils Soft margarine without trans fats. Vegetable oil. Low-fat, reduced-fat, or light mayonnaise and salad dressings (reduced-sodium). Canola, safflower, olive, soybean, and sunflower oils. Avocado. Seasoning and other foods Herbs. Spices. Seasoning mixes without salt. Unsalted popcorn and pretzels. Fat-free sweets. What foods are not recommended? The items listed may not be a complete list. Talk with your dietitian about what dietary choices are best for you. Grains Baked goods made with fat, such as croissants, muffins, or some breads. Dry pasta or rice meal packs. Vegetables Creamed or fried vegetables. Vegetables in a cheese sauce. Regular canned vegetables (not low-sodium or reduced-sodium). Regular canned tomato sauce and paste (not low-sodium or reduced-sodium). Regular tomato and vegetable juice (not low-sodium or reduced-sodium). Pickles. Olives. Fruits Canned fruit in a light or heavy syrup. Fried fruit. Fruit in cream or butter sauce. Meat and other protein foods Fatty cuts of meat. Ribs. Fried meat. Bacon. Sausage. Bologna and other processed lunch meats. Salami. Fatback. Hotdogs. Bratwurst. Salted nuts and seeds. Canned beans with added salt. Canned or smoked fish. Whole eggs or egg yolks. Chicken or turkey with skin. Dairy Whole or 2% milk, cream, and half-and-half. Whole or full-fat cream cheese. Whole-fat or sweetened yogurt. Full-fat cheese. Nondairy creamers. Whipped toppings.  Processed cheese and cheese spreads. Fats and oils Butter. Stick margarine. Lard. Shortening. Ghee. Bacon fat. Tropical oils, such as coconut, palm kernel, or palm oil. Seasoning and other foods Salted popcorn and pretzels. Onion salt, garlic salt, seasoned salt, table salt, and sea salt. Worcestershire sauce. Tartar sauce. Barbecue sauce. Teriyaki sauce. Soy sauce, including reduced-sodium. Steak sauce. Canned and packaged gravies. Fish sauce. Oyster sauce. Cocktail sauce. Horseradish that you find on the shelf. Ketchup. Mustard. Meat flavorings and tenderizers. Bouillon cubes. Hot sauce and Tabasco sauce. Premade or packaged marinades. Premade or packaged taco seasonings. Relishes. Regular salad dressings. Where to find more information:  National Heart, Lung, and Blood Institute: www.nhlbi.nih.gov  American Heart Association: www.heart.org Summary  The DASH eating plan is a healthy eating plan that has been shown to reduce high blood pressure (hypertension). It may also reduce your risk for type 2 diabetes, heart disease, and stroke.  With the DASH eating plan, you should limit salt (sodium) intake to 2,300 mg a day. If you have hypertension, you may need to reduce your sodium intake to 1,500 mg a day.  When on the DASH eating plan, aim to eat more fresh fruits and vegetables, whole grains, lean proteins, low-fat dairy, and heart-healthy fats.  Work with your health care provider or diet and nutrition specialist (dietitian) to adjust your eating plan to your   individual calorie needs. This information is not intended to replace advice given to you by your health care provider. Make sure you discuss any questions you have with your health care provider. Document Revised: 12/15/2016 Document Reviewed: 12/27/2015 Elsevier Patient Education  2020 Elsevier Inc.  

## 2020-01-19 IMAGING — US US THYROID
1 series · 13 of 25 positions shown · non-contrast
Comparison: Prior thyroid ultrasound 07/04/2013

CLINICAL DATA: Goiter. 62-year-old female with a history of thyroid
nodules. She reportedly underwent biopsy of the dominant right
nodule in 9110.

EXAM:
THYROID ULTRASOUND
TECHNIQUE: Ultrasound examination of the thyroid gland and adjacent soft
tissues was performed.

[Series 1: us thyroid · 0.07mm/px · 13 of 28 slices shown]
[im 1/28]
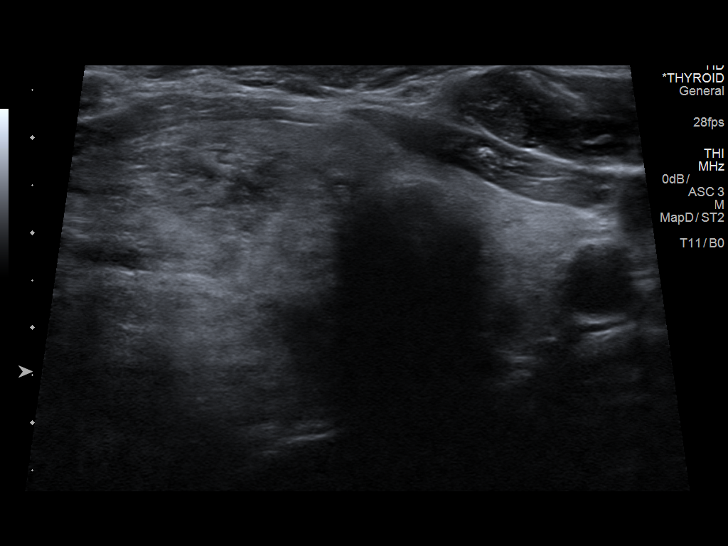
[im 3/28]
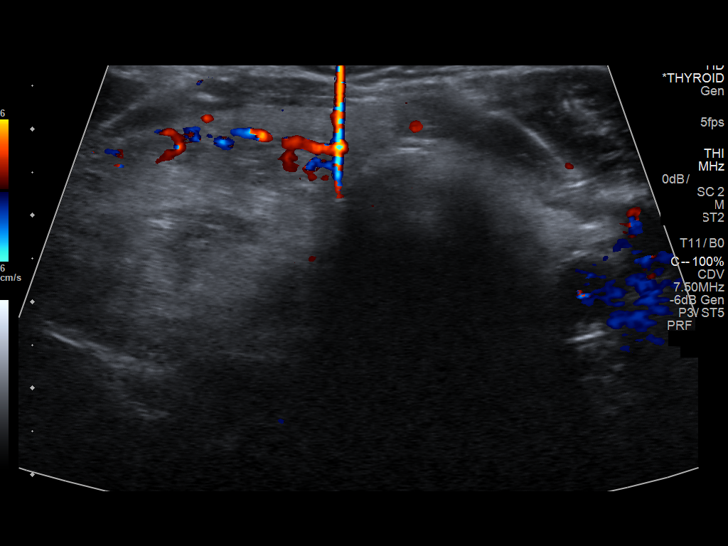
[im 5/28]
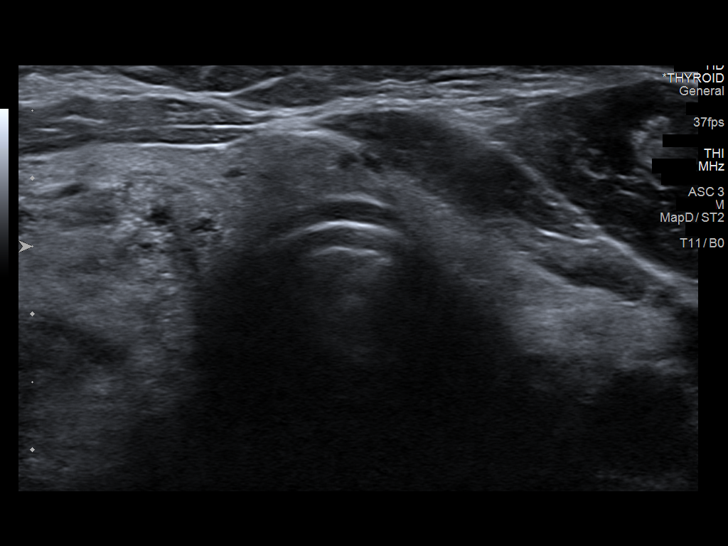
[im 7/28]
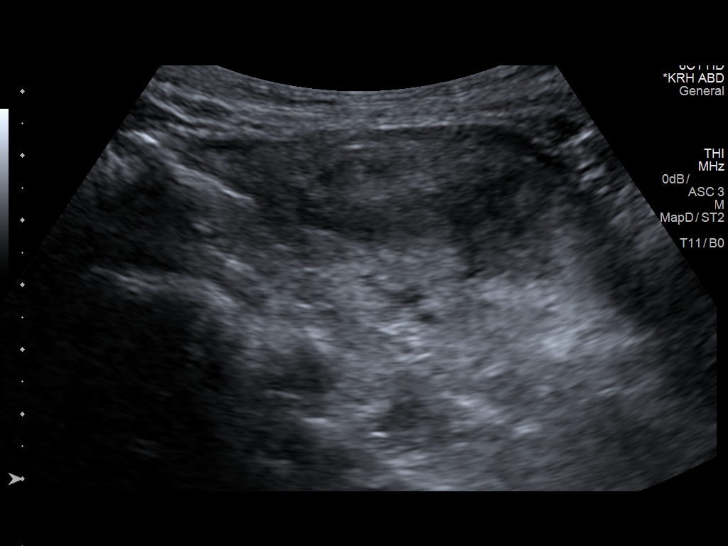
[im 10/28]
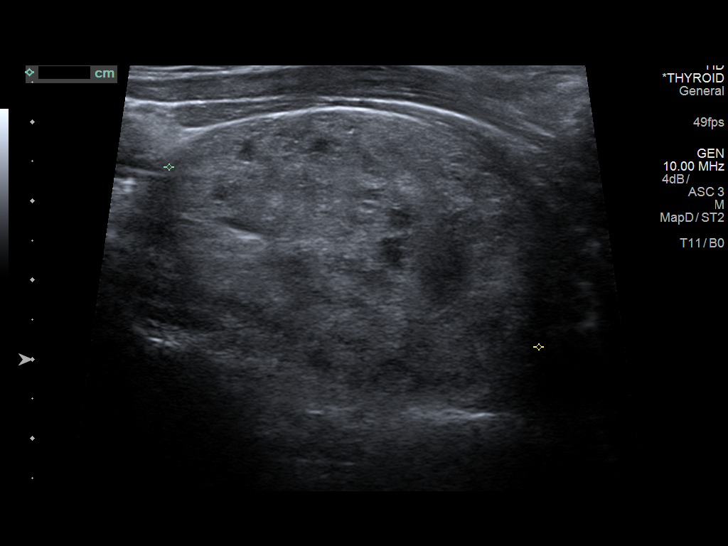
[im 12/28]
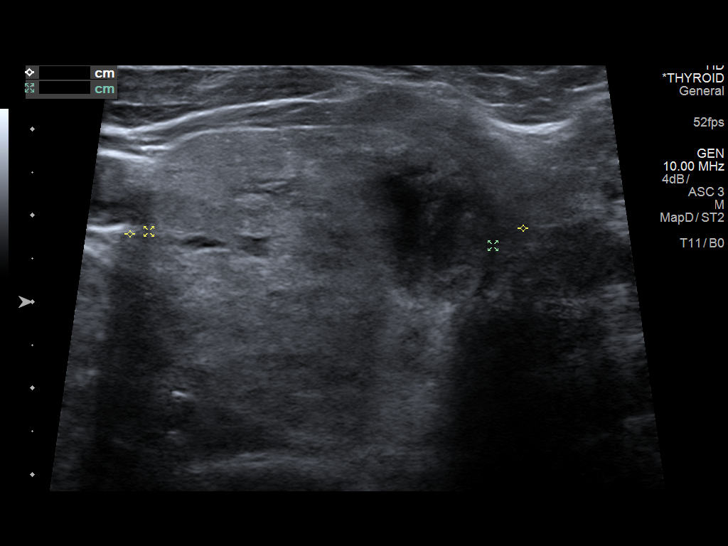
[im 14/28]
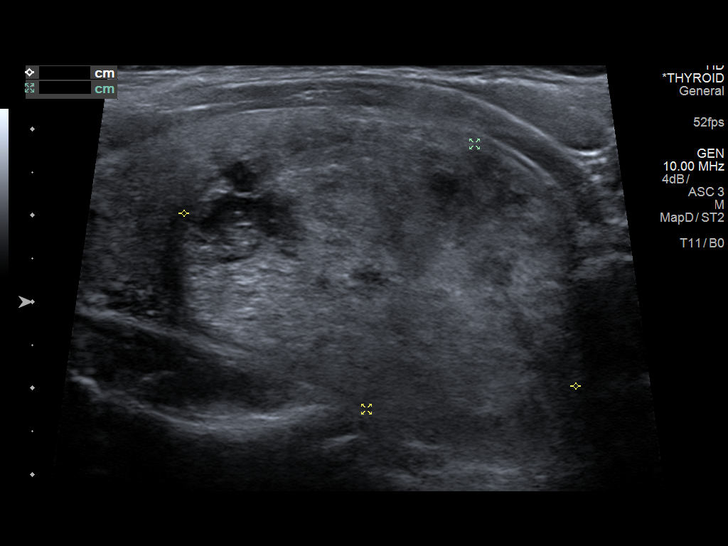
[im 16/28]
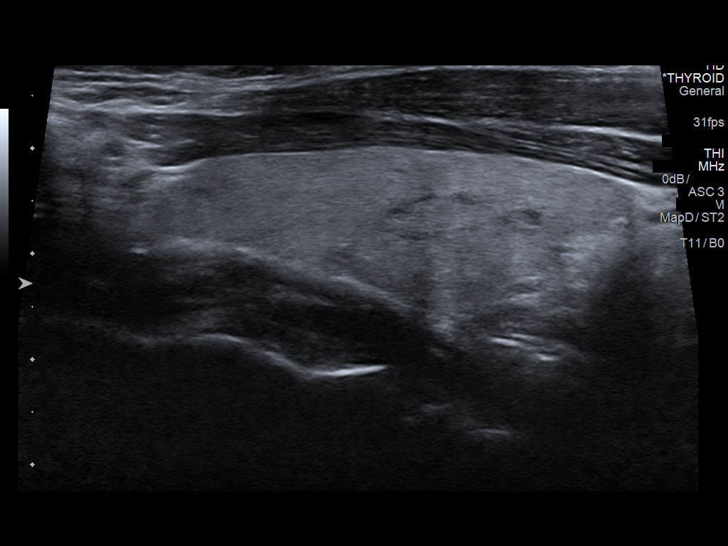
[im 19/28]
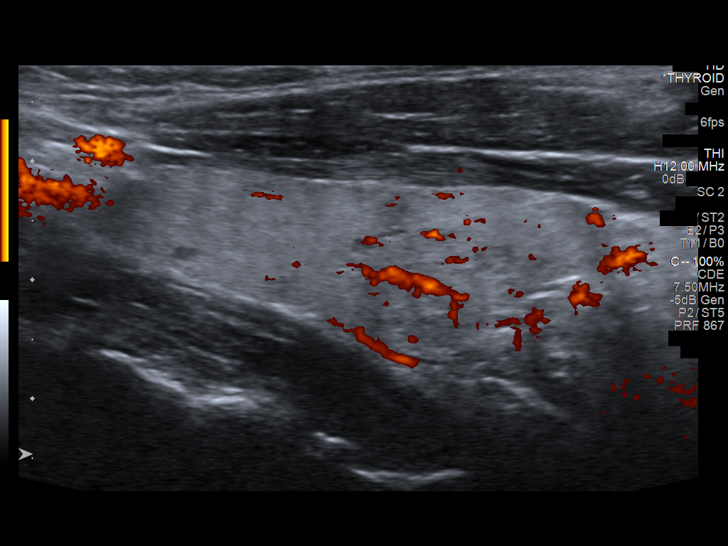
[im 21/28]
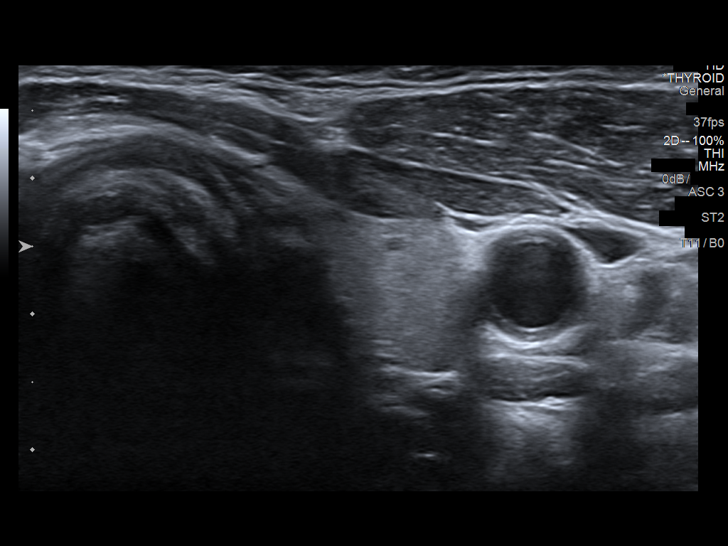
[im 23/28]
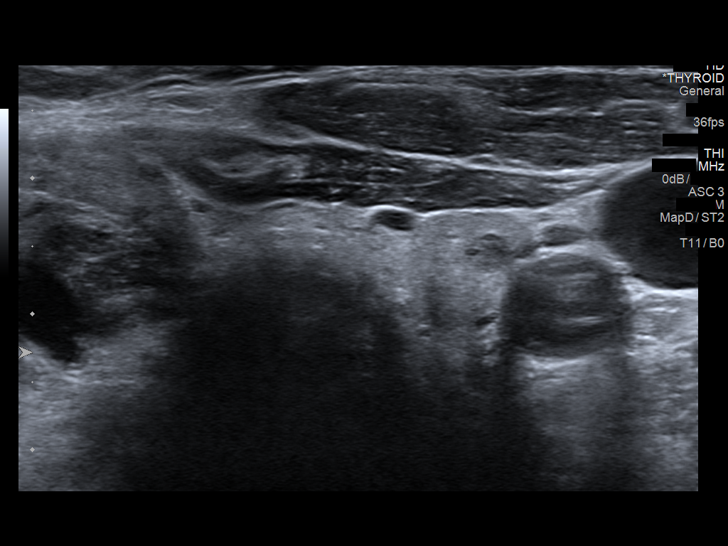
[im 25/28]
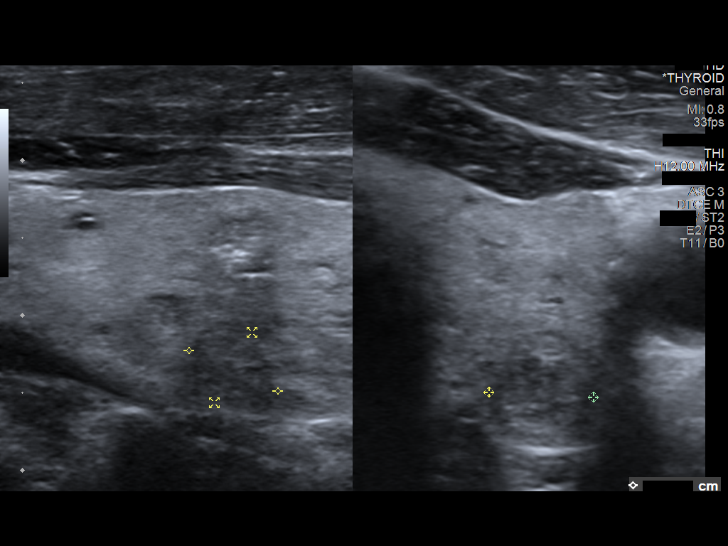
[im 28/28]
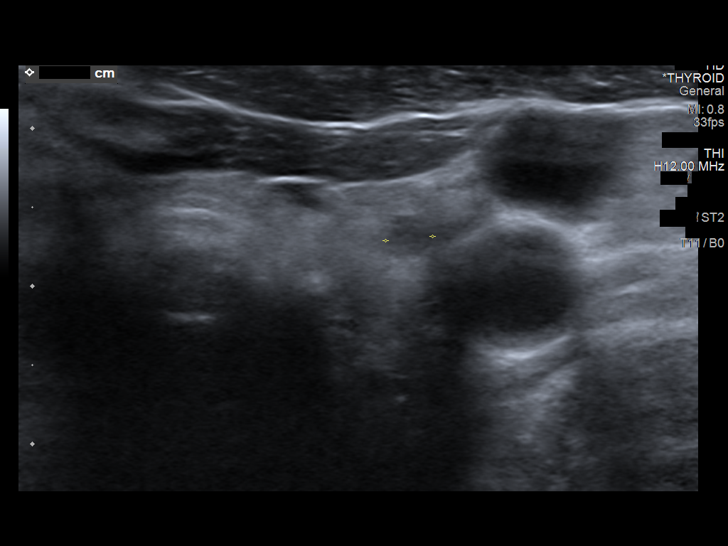

[13 of 25 positions shown; findings below may reference images not displayed]

FINDINGS: Parenchymal Echotexture: Moderately heterogenous

Isthmus: 0.4 cm

Right lobe: 7.9 x 3.8 x 4.6 cm

Left lobe: 4.6 x 1.6 x 1.8 cm

_________________________________________________________

Estimated total number of nodules >/= 1 cm: 1

Number of spongiform nodules >/=  2 cm not described below (TR1): 0

Number of mixed cystic and solid nodules >/= 1.5 cm not described
below (TR2): 0

_________________________________________________________

Diffusely enlarged and heterogeneous thyroid gland. There is a
dominant mass occupying the majority of the right gland which was
reportedly previously biopsied. The lesion measures 5.2 x 3.3 x
cm which is essentially unchanged compared to 5.2 x 3.3 x 3.7 cm in
Sunday June, 2013. Multiple additional tiny nodules are noted scattered
throughout the left gland, none measuring larger than 0.6 cm and
none significantly changed compared to prior. None of these
incidental findings meet criteria to warrant biopsy or further
evaluation.
IMPRESSION: 1. Four year stability of the previously biopsied right-sided
thyroid nodule. Recommend correlation with prior biopsy results.
2. No significant interval change in the multiple small left-sided
thyroid nodules which do not meet criteria for further evaluation.

The above is in keeping with the ACR TI-RADS recommendations - [HOSPITAL] 8736;[DATE].

## 2020-01-21 ENCOUNTER — Ambulatory Visit (INDEPENDENT_AMBULATORY_CARE_PROVIDER_SITE_OTHER): Payer: Medicare Other | Admitting: Neurology

## 2020-01-21 ENCOUNTER — Other Ambulatory Visit: Payer: Self-pay

## 2020-01-21 DIAGNOSIS — R259 Unspecified abnormal involuntary movements: Secondary | ICD-10-CM

## 2020-01-21 DIAGNOSIS — R251 Tremor, unspecified: Secondary | ICD-10-CM | POA: Diagnosis not present

## 2020-01-26 ENCOUNTER — Other Ambulatory Visit: Payer: Self-pay

## 2020-01-26 ENCOUNTER — Other Ambulatory Visit (INDEPENDENT_AMBULATORY_CARE_PROVIDER_SITE_OTHER): Payer: Medicare Other

## 2020-01-26 ENCOUNTER — Telehealth: Payer: Self-pay

## 2020-01-26 DIAGNOSIS — I10 Essential (primary) hypertension: Secondary | ICD-10-CM | POA: Diagnosis not present

## 2020-01-26 LAB — COMPREHENSIVE METABOLIC PANEL
ALT: 19 U/L (ref 0–35)
AST: 17 U/L (ref 0–37)
Albumin: 4.3 g/dL (ref 3.5–5.2)
Alkaline Phosphatase: 55 U/L (ref 39–117)
BUN: 11 mg/dL (ref 6–23)
CO2: 28 mEq/L (ref 19–32)
Calcium: 9.6 mg/dL (ref 8.4–10.5)
Chloride: 104 mEq/L (ref 96–112)
Creatinine, Ser: 0.79 mg/dL (ref 0.40–1.20)
GFR: 77.71 mL/min (ref >60.00)
Glucose, Bld: 112 mg/dL — ABNORMAL HIGH (ref 70–99)
Potassium: 4.1 mEq/L (ref 3.5–5.1)
Sodium: 139 mEq/L (ref 135–145)
Total Bilirubin: 1 mg/dL (ref 0.2–1.2)
Total Protein: 7.5 g/dL (ref 6.0–8.3)

## 2020-01-26 LAB — LIPID PANEL
Cholesterol: 161 mg/dL (ref 0–200)
HDL: 54.5 mg/dL (ref >39.00)
LDL Cholesterol: 81 mg/dL (ref 0–99)
NonHDL: 106.79
Total CHOL/HDL Ratio: 3
Triglycerides: 131 mg/dL (ref 0.0–149.0)
VLDL: 26.2 mg/dL (ref 0.0–40.0)

## 2020-01-26 NOTE — Telephone Encounter (Signed)
Contacted patient to give EEG results. She voiced understanding and wanted to know what to do from here.

## 2020-01-26 NOTE — Procedures (Signed)
ELECTROENCEPHALOGRAM REPORT  Dates of Recording: 01/21/2020 7:51AM to 01/22/2020 7:53AM  Patient's Name: Megan [name] MRN: [id number] Date of Birth: 04/24/1955  Referring Provider: Dr. Wells Guiles Tat  Procedure: 24-hour ambulatory video EEG  History: This is a 65 year old woman with abnormal involuntary movements.  Medications:  NORVASC 5 MG tablet b complex vitamins tablet VITAMIN D3 250 MCG (10000 UT) TABS FISH OIL + D3 PO FLONASE 50 MCG/ACT nasal spray AMARYL 4 MG tablet CLARITIN 10 MG tablet COZAAR 100 MG tablet SINGULAIR 10 MG tablet MULTIVITAMIN tablet JANUMET 50-1000 MG tablet DESYREL 50 MG tablet   Technical Summary: This is a 24-hour multichannel digital video EEG recording measured by the international 10-20 system with electrodes applied with paste and impedances below 5000 ohms performed as portable with EKG monitoring.  The digital EEG was referentially recorded, reformatted, and digitally filtered in a variety of bipolar and referential montages for optimal display.    DESCRIPTION OF RECORDING: During maximal wakefulness, the background activity consisted of a symmetric 8 Hz posterior dominant rhythm which was reactive to eye opening.  There were no epileptiform discharges or focal slowing seen in wakefulness.  During the recording, the patient progresses through wakefulness, drowsiness, and Stage 2 sleep.  Again, there were no epileptiform discharges seen.  Events: On 1/5 at 0827, she is feeling anxious. Patient sitting on bed in EEG room, no clinical changes seen. Electrographically, there were no EEG or EKG changes seen.  On 1/5 at 0840 hours, she has a twitch. Patient not on video. There is movement artifact, no epileptiform changes seen.  On 1/5 at 1030 hours, she reports T. She reports feeling sad at 1035 hours. Patient is sitting on couch facing away from camera, no clinical changes seen. Electrographically, there were no EEG or EKG changes seen.  On 1/5  at 1112 hours, she reports T. Patient sitting on couch facing away from camera, no clinical changes seen. Electrographically, there were no EEG or EKG changes seen.  On 1/5 at 1231, 1255 hours, she reports T. Patient sitting on couch facing away from camera, no clinical changes seen. Electrographically, there were no EEG or EKG changes seen.  On 1/5 at 1323 hours, she reports T. She is sitting on couch facing away from camera, there is a brief upper body jerk seen. Electrographically, there were no EEG or EKG changes seen.  On 1/5 at 1620 hours, patient reports T. Patient not on video. Electrographically, there were no EEG or EKG changes seen.  On 1/5 at 2300 hours, patient reports "dizzy/threw up." Patient not on video. Electrographically, there were no EEG or EKG changes seen.  There were no electrographic seizures seen.  EKG lead was unremarkable.   IMPRESSION: This 24-hour ambulatory video EEG study is normal. Episodes of body twitch, feeling anxious, dizzy/vomiting, did not show EEG correlate. There was only one event where she reported symptoms that a quick upper body jerk was seen but she was not facing the camera, no EEG correlate.  CLINICAL CORRELATION: A normal EEG does not exclude a clinical diagnosis of epilepsy. Episodes of body twitch did not show epileptiform correlate.  If further clinical questions remain, inpatient video EEG monitoring may be helpful.   Ellouise Newer, M.D.

## 2020-01-27 ENCOUNTER — Encounter: Payer: Self-pay | Admitting: Gastroenterology

## 2020-01-27 NOTE — Telephone Encounter (Signed)
Will need to f/u with PCP to see if any thoughts from their side.  We haven't found any neurologic etiology.  She had a brief spell during the EEG and no EEG correlate or change in the EEG

## 2020-01-27 NOTE — Telephone Encounter (Signed)
Left detailed message on patients voicemail, per DPR,  Informing her of Dr Doristine Devoid recommendations. Advised a call back with any questions or concerns.

## 2020-02-09 ENCOUNTER — Other Ambulatory Visit: Payer: Self-pay | Admitting: Family

## 2020-02-11 ENCOUNTER — Encounter: Payer: Self-pay | Admitting: Gastroenterology

## 2020-02-11 ENCOUNTER — Ambulatory Visit: Payer: Medicare Other | Admitting: Gastroenterology

## 2020-02-11 ENCOUNTER — Other Ambulatory Visit: Payer: Self-pay

## 2020-02-11 VITALS — BP 130/70 | HR 88 | Ht 65.0 in | Wt 176.0 lb

## 2020-02-11 DIAGNOSIS — R1084 Generalized abdominal pain: Secondary | ICD-10-CM | POA: Diagnosis not present

## 2020-02-11 DIAGNOSIS — Z9889 Other specified postprocedural states: Secondary | ICD-10-CM

## 2020-02-11 DIAGNOSIS — Z8719 Personal history of other diseases of the digestive system: Secondary | ICD-10-CM | POA: Diagnosis not present

## 2020-02-11 NOTE — Progress Notes (Signed)
02/11/2020 Megan [name] [id number] 09/13/1955   HISTORY OF PRESENT ILLNESS:  This is a pleasant 65 year old female who is a patient of Dr. Corena Pilgrim.  She presents here today with complaints of rectal bleeding and generalized abdominal pain.  She tells me that she had one episode of rectal bleeding back in November.  It was described as bright red blood on the toilet paper and also some in the bowl.  It only happened on one occasion and resolved and has not recurred since that time.  In November was 11.3 g, which is stable and consistent with her baseline  She also reports generalized mid abdominal cramping.  She says that at times she feels like she can actually feel food go through the area of her intestines where she has mesh.  She has had a few ventral/incisional as well as some inguinal hernia repairs.  No significant abdominal pain.  Moves her bowels fairly regularly.  No other associated complaints.  Colonoscopy 10/2016 with multiple diverticula in the entire colon.   Past Medical History:  Diagnosis Date  . Allergy   . Arthritis    fingers  . Colon polyp   . Diabetes mellitus   . Diverticulosis   . GERD (gastroesophageal reflux disease)    occasionally  . H/O hiatal hernia    ?  Marland Kitchen Hemorrhoids   . Hyperlipidemia    takes fish oil  . Hypertension   . Infectious colitis   . Ventral hernia    Past Surgical History:  Procedure Laterality Date  . colonscopy    . DIAGNOSTIC LAPAROSCOPY    . FOOT SURGERY Bilateral    hammer toes  . HEMORRHOID SURGERY    . HERNIA REPAIR  01/20/2011   incisional  . INCISIONAL HERNIA REPAIR  01/20/2011   Procedure: LAPAROSCOPIC INCISIONAL HERNIA;  Surgeon: Judieth Keens, DO;  Location: [facility];  Service: General;  Laterality: N/A;  Laparoscopic repair of a recurrent incisional hernia with Mesh  . INGUINAL HERNIA REPAIR     x's 3  . VENTRAL HERNIA REPAIR      reports that she quit smoking about 24 years ago. Her smoking use  included cigarettes. She has a 20.00 pack-year smoking history. She has never used smokeless tobacco. She reports previous alcohol use of about 4.0 standard drinks of alcohol per week. She reports that she does not use drugs. family history includes Breast cancer in her mother and sister; Dementia in her mother; Diabetes in her brother and mother; Heart disease in her father; Liver cancer in her mother. Allergies  Allergen Reactions  . Aspirin Nausea Only      Outpatient Encounter Medications as of 02/11/2020  Medication Sig  . amLODipine (NORVASC) 10 MG tablet TAKE 1 TABLET(10 MG) BY MOUTH DAILY  . b complex vitamins tablet Take 1 tablet by mouth daily.  . Cholecalciferol (VITAMIN D3) 250 MCG (10000 UT) TABS Take by mouth daily.  Marland Kitchen dextromethorphan-guaiFENesin (MUCINEX DM) 30-600 MG 12hr tablet Take 1 tablet by mouth 2 (two) times daily.  Marland Kitchen Fish Oil-Cholecalciferol (FISH OIL + D3 PO) Take 1,200 mg by mouth daily.   . fluticasone (FLONASE) 50 MCG/ACT nasal spray Place 2 sprays into both nostrils daily.  Marland Kitchen glimepiride (AMARYL) 2 MG tablet Take 2 tablets (4 mg total) by mouth daily before breakfast.  . loratadine (CLARITIN) 10 MG tablet Take 1 tablet (10 mg total) by mouth daily.  Marland Kitchen losartan (COZAAR) 100 MG tablet TK 1  T PO D  . montelukast (SINGULAIR) 10 MG tablet Take 1 tablet (10 mg total) by mouth at bedtime.  . Multiple Vitamin (MULTIVITAMIN) tablet Take 1 tablet by mouth daily. OVER 50 VITAMIN  . sitaGLIPtin-metformin (JANUMET) 50-1000 MG tablet TAKE 1 TABLET BY MOUTH TWICE DAILY WITH A MEAL  . traZODone (DESYREL) 50 MG tablet Take 0.5-1 tablets (25-50 mg total) by mouth at bedtime as needed for sleep.   No facility-administered encounter medications on file as of 02/11/2020.     REVIEW OF SYSTEMS  : All other systems reviewed and negative except where noted in the History of Present Illness.   PHYSICAL EXAM: BP 130/70   Pulse 88   Ht 5\' 5"  (1.651 m)   Wt 176 lb (79.8 kg)   BMI  29.29 kg/m  General: Well developed AA female in no acute distress Head: Normocephalic and atraumatic Eyes:  Sclerae anicteric, conjunctiva pink. Ears: Normal auditory acuity Lungs: Clear throughout to auscultation; no W/R/R. Heart: Regular rate and rhythm; no M/R/G. Abdomen: Soft, non-distended.  BS present.  Mild mid-abdominal TTP. Musculoskeletal: Symmetrical with no gross deformities  Skin: No lesions on visible extremities Extremities: No edema  Neurological: Alert oriented x 4, grossly non-focal Psychological:  Alert and cooperative. Normal mood and affect  ASSESSMENT AND PLAN: *Rectal bleeding:  One small volume episode back in November.  Self-limited, likely anorectal source such as hemorrhoidal bleed vs small diverticular bleed.  Colonoscopy 10/2016 without polyps and 10 year recall recommended.  Monitor for now. *Generalized abdominal pain with history of ventral/inguinal/incisional hernia repair:  Describes feeling food go through intestines in the area of her mesh.  Will plan for CT scan of the abdomen and pelvis with contrast.   CC:  [name], *

## 2020-02-11 NOTE — Patient Instructions (Signed)
If you are age 67 or older, your body mass index should be between 23-30. Your Body mass index is 29.29 kg/m. If this is out of the aforementioned range listed, please consider follow up with your Primary Care Provider.  If you are age 37 or younger, your body mass index should be between 19-25. Your Body mass index is 29.29 kg/m. If this is out of the aformentioned range listed, please consider follow up with your Primary Care Provider.   You have been scheduled for a CT scan of the abdomen and pelvis at [facility], 1st floor Radiology. You are scheduled on 02/19/20  at [facility] should arrive 15 minutes prior to your appointment time for registration.  Please pick up 2 bottles of contrast from [facility] at least 3 days prior to your scan. The solution may taste better if refrigerated, but do NOT add ice or any other liquid to this solution. Shake well before drinking.   Please follow the written instructions below on the day of your exam:   1) Do not eat anything after 5am (4 hours prior to your test)   2) Drink 1 bottle of contrast @ 7am (2 hours prior to your exam)  Remember to shake well before drinking and do NOT pour over ice.     Drink 1 bottle of contrast @ 8am (1 hour prior to your exam)   You may take any medications as prescribed with a small amount of water, if necessary. If you take any of the following medications: METFORMIN, GLUCOPHAGE, GLUCOVANCE, AVANDAMET, RIOMET, FORTAMET, [place] MET, JANUMET, GLUMETZA or METAGLIP, you MAY be asked to HOLD this medication 48 hours AFTER the exam.   The purpose of you drinking the oral contrast is to aid in the visualization of your intestinal tract. The contrast solution may cause some diarrhea. Depending on your individual set of symptoms, you may also receive an intravenous injection of x-ray contrast/dye. Plan on being at [facility] for 45 minutes or longer, depending on the type of exam you are having performed.   If you have  any questions regarding your exam or if you need to reschedule, you may call [facility] at [phone] between the hours of 8:00 am and 5:00 pm, Monday-Friday.   Due to recent changes in healthcare laws, you may see the results of your imaging and laboratory studies on MyChart before your provider has had a chance to review them.  We understand that in some cases there may be results that are confusing or concerning to you. Not all laboratory results come back in the same time frame and the provider may be waiting for multiple results in order to interpret others.  Please give [place] 48 hours in order for your provider to thoroughly review all the results before contacting the office for clarification of your results.   Thank you for choosing me and [facility].  Alonza Bogus, PA-C

## 2020-02-17 ENCOUNTER — Ambulatory Visit (INDEPENDENT_AMBULATORY_CARE_PROVIDER_SITE_OTHER): Payer: Medicare Other | Admitting: Internal Medicine

## 2020-02-17 ENCOUNTER — Encounter: Payer: Self-pay | Admitting: Internal Medicine

## 2020-02-17 ENCOUNTER — Other Ambulatory Visit: Payer: Self-pay

## 2020-02-17 VITALS — BP 144/82 | HR 88 | Ht 65.0 in | Wt 177.2 lb

## 2020-02-17 DIAGNOSIS — E1151 Type 2 diabetes mellitus with diabetic peripheral angiopathy without gangrene: Secondary | ICD-10-CM | POA: Diagnosis not present

## 2020-02-17 DIAGNOSIS — L659 Nonscarring hair loss, unspecified: Secondary | ICD-10-CM

## 2020-02-17 DIAGNOSIS — E1165 Type 2 diabetes mellitus with hyperglycemia: Secondary | ICD-10-CM | POA: Diagnosis not present

## 2020-02-17 DIAGNOSIS — IMO0002 Reserved for concepts with insufficient information to code with codable children: Secondary | ICD-10-CM | POA: Insufficient documentation

## 2020-02-17 DIAGNOSIS — E118 Type 2 diabetes mellitus with unspecified complications: Secondary | ICD-10-CM | POA: Insufficient documentation

## 2020-02-17 LAB — POCT GLYCOSYLATED HEMOGLOBIN (HGB A1C): Hemoglobin A1C: 5.9 % — AB (ref 4.0–5.6)

## 2020-02-17 LAB — TSH: TSH: 0.56 u[IU]/mL (ref 0.35–4.50)

## 2020-02-17 LAB — T4, FREE: Free T4: 0.96 ng/dL (ref 0.60–1.60)

## 2020-02-17 MED ORDER — GLIMEPIRIDE 2 MG PO TABS: 2.0000 mg | ORAL_TABLET | Freq: Every day | ORAL | 1 refills | Status: DC

## 2020-02-17 NOTE — Patient Instructions (Signed)
-   Keep up the Good work ! - Decrease Glimepeiride to 2 mg, one  tablet before Breakfast  - Continue Janumet 50-1000 mg twice daily       HOW TO TREAT LOW BLOOD SUGARS (Blood sugar LESS THAN 70 MG/DL)  Please follow the RULE OF 15 for the treatment of hypoglycemia treatment (when your (blood sugars are less than 70 mg/dL)    STEP 1: Take 15 grams of carbohydrates when your blood sugar is low, which includes:   3-4 GLUCOSE TABS  OR  3-4 OZ OF JUICE OR REGULAR SODA OR  ONE TUBE OF GLUCOSE GEL     STEP 2: RECHECK blood sugar in 15 MINUTES STEP 3: If your blood sugar is still low at the 15 minute recheck --> then, go back to STEP 1 and treat AGAIN with another 15 grams of carbohydrates.

## 2020-02-17 NOTE — Progress Notes (Signed)
Name: Megan [name]  Age/ Sex: 65 y.o., female   MRN/ DOB: [id number], 31-Oct-1955     PCP: [name], DO   Reason for Endocrinology Evaluation: Type 2 Diabetes Mellitus  Initial Endocrine Consultative Visit: 09/02/2019    PATIENT IDENTIFIER: Megan [name] is a 65 y.o. female with a past medical history of T2DM, HTN and Dyslipidemia. The patient has followed with Endocrinology clinic since 09/02/2019 for consultative assistance with management of her diabetes.  DIABETIC HISTORY:  [name] was diagnosed with DM in 2008.She has been on Metformin, Glimepiride, repaglinide and Januvia . Her hemoglobin A1c has ranged from 6.5% in 2014, peaking at 7.4% in 10/2018  On her initial visit to our clinic she has an A1c of 7.3% . She was on Repaglinide which we stopped due to hypoglycemia, Decreased Glimepiride and continued Janumet.   SUBJECTIVE:   During the last visit (11/11/2019): A1c 5.8  % We decreased Glimepiride  due to hypoglycemia. Continue Janumet      Today (02/17/2020): [name] is here for a follow up on diabetes management.  She checks her blood sugars 1times daily, preprandial to breakfast. The patient has not had hypoglycemic episodes since the last clinic visit.  She has been coughing, no fever.  She has hair loss and swelling around the eyes , she doesn't sleep   HOME DIABETES REGIMEN:  Janumet 50-1000 mg BID Glimepiride 4 mg , 1 tabs daily     Statin: Declines  ACE-I/ARB: yes    METER DOWNLOAD SUMMARY: Unable to download  77-  167   Mg/dL    DIABETIC COMPLICATIONS: Microvascular complications:    Denies: CKD, retinopathy, neuropathy  Last Eye Exam: Completed 2020  Macrovascular complications:    Denies: CAD, CVA, PVD   HISTORY:  Past Medical History:  Past Medical History:  Diagnosis Date  . Allergy   . Arthritis    fingers  . Colon polyp   . Diabetes mellitus   . Diverticulosis   . GERD (gastroesophageal reflux  disease)    occasionally  . H/O hiatal hernia    ?  Marland Kitchen Hemorrhoids   . Hyperlipidemia    takes fish oil  . Hypertension   . Infectious colitis   . Ventral hernia    Past Surgical History:  Past Surgical History:  Procedure Laterality Date  . colonscopy    . DIAGNOSTIC LAPAROSCOPY    . FOOT SURGERY Bilateral    hammer toes  . HEMORRHOID SURGERY    . HERNIA REPAIR  01/20/2011   incisional  . INCISIONAL HERNIA REPAIR  01/20/2011   Procedure: LAPAROSCOPIC INCISIONAL HERNIA;  Surgeon: Judieth Keens, DO;  Location: [facility];  Service: General;  Laterality: N/A;  Laparoscopic repair of a recurrent incisional hernia with Mesh  . INGUINAL HERNIA REPAIR     x's 3  . VENTRAL HERNIA REPAIR      Social History:  reports that she quit smoking about 24 years ago. Her smoking use included cigarettes. She has a 20.00 pack-year smoking history. She has never used smokeless tobacco. She reports previous alcohol use of about 4.0 standard drinks of alcohol per week. She reports that she does not use drugs. Family History:  Family History  Problem Relation Age of Onset  . Breast cancer Mother   . Diabetes Mother   . Dementia Mother   . Liver cancer Mother   . Heart disease Father  heart attack  . Diabetes Brother   . Breast cancer Sister   . Colon cancer Neg Hx   . Stomach cancer Neg Hx      HOME MEDICATIONS: Allergies as of 02/17/2020      Reactions   Aspirin Nausea Only      Medication List       Accurate as of February 17, 2020  9:15 AM. If you have any questions, ask your nurse or doctor.        amLODipine 10 MG tablet Commonly known as: NORVASC TAKE 1 TABLET(10 MG) BY MOUTH DAILY   b complex vitamins tablet Take 1 tablet by mouth daily.   dextromethorphan-guaiFENesin 30-600 MG 12hr tablet Commonly known as: MUCINEX DM Take 1 tablet by mouth 2 (two) times daily.   FISH OIL + D3 PO Take 1,200 mg by mouth daily.   fluticasone 50 MCG/ACT nasal spray Commonly  known as: FLONASE Place 2 sprays into both nostrils daily.   glimepiride 2 MG tablet Commonly known as: AMARYL Take 2 tablets (4 mg total) by mouth daily before breakfast.   Janumet 50-1000 MG tablet Generic drug: sitaGLIPtin-metformin TAKE 1 TABLET BY MOUTH TWICE DAILY WITH A MEAL   loratadine 10 MG tablet Commonly known as: CLARITIN Take 1 tablet (10 mg total) by mouth daily.   losartan 100 MG tablet Commonly known as: COZAAR TK 1 T PO D   montelukast 10 MG tablet Commonly known as: SINGULAIR Take 1 tablet (10 mg total) by mouth at bedtime.   multivitamin tablet Take 1 tablet by mouth daily. OVER 50 VITAMIN   traZODone 50 MG tablet Commonly known as: DESYREL Take 0.5-1 tablets (25-50 mg total) by mouth at bedtime as needed for sleep.   Vitamin D3 250 MCG (10000 UT) Tabs Take by mouth daily.        OBJECTIVE:   Vital Signs: BP (!) 144/82   Pulse 88   Ht 5\' 5"  (1.651 m)   Wt 177 lb 4 oz (80.4 kg)   SpO2 98%   BMI 29.50 kg/m   Wt Readings from Last 3 Encounters:  02/17/20 177 lb 4 oz (80.4 kg)  02/11/20 176 lb (79.8 kg)  12/26/19 177 lb 12.8 oz (80.6 kg)     Exam: General: Pt appears well and is in NAD  Neck: General: Supple without adenopathy. Thyroid: Thyroid size normal.  No goiter or nodules appreciated. No thyroid bruit.  Lungs: Clear with good BS bilat with no rales, rhonchi, or wheezes  Heart: RRR   Abdomen: Normoactive bowel sounds, soft, nontender, without masses or organomegaly palpable  Extremities: No pretibial edema. No tremor. Normal strength and motion throughout. See detailed diabetic foot exam below.  Neuro: MS is good with appropriate affect, pt is alert and Ox3    DM foot exam  02/17/2020 The skin of the feet is intact without sores or ulcerations. The pedal pulses are 2+ on right and 2+ on left. The sensation is intact to a screening 5.07, 10 gram monofilament bilaterally    DATA REVIEWED:  Lab Results  Component Value Date    HGBA1C 5.9 (A) 02/17/2020   HGBA1C 5.8 (A) 11/11/2019   HGBA1C 7.3 (H) 07/14/2019   Lab Results  Component Value Date   MICROALBUR 0.8 07/14/2019   LDLCALC 81 01/26/2020   CREATININE 0.79 01/26/2020   Lab Results  Component Value Date   MICRALBCREAT 2.0 07/14/2019     Lab Results  Component Value Date   CHOL 161  01/26/2020   HDL 54.50 01/26/2020   LDLCALC 81 01/26/2020   LDLDIRECT 88.0 04/21/2014   TRIG 131.0 01/26/2020   CHOLHDL 3 01/26/2020       Results for LASHAWNA, POCHE (MRN [id number]) as of 02/18/2020 13:46  Ref. Range 02/17/2020 09:35  TSH Latest Ref Range: 0.35 - 4.50 uIU/mL 0.56  T4,Free(Direct) Latest Ref Range: 0.60 - 1.60 ng/dL 0.96    ASSESSMENT / PLAN / RECOMMENDATIONS:   1) Type 2 Diabetes Mellitus, Optimally controlled, Without complications - Most recent A1c of 5.9 %. Goal A1c < 7.0 %.     - A1c continues to be low , will continue to reduce Glimepiride in an attempt to get her off     MEDICATIONS: - Decrease Glimepeiride 2 mg, one  tablet before Breakfast  - Continue Janumet 50-1000 mg twice daily   EDUCATION / INSTRUCTIONS:  BG monitoring instructions: Patient is instructed to check her blood sugars 1 times a day, fasting .  Call [facility] if: BG persistently < 70 . I reviewed the Rule of 15 for the treatment of hypoglycemia in detail with the patient. Literature supplied.   2) Diabetic complications:   Eye: Does not have known diabetic retinopathy.   Neuro/ Feet: Does not have known diabetic peripheral neuropathy .   Renal: Patient does not have known baseline CKD. She   is on an ACEI/ARB at present.    3)Hair loss:   - Pt is c/o hair loss and periorbital edema .  - TFT's are normal    F/U in 3 months    Signed electronically by: Mack Guise, MD  [facility] End[facility]  [address] [place], [place] [address] Phone: [phone] FAX:  [phone]   CC: [name], DO [address] [place] [address] Phone: [phone]  Fax: [phone]  Return to Endocrinology clinic as below: Future Appointments  Date Time Provider [place]  02/19/2020  9:00 AM [facility] 2 [facility] [place]

## 2020-02-18 ENCOUNTER — Other Ambulatory Visit: Payer: Self-pay | Admitting: Family Medicine

## 2020-02-19 ENCOUNTER — Telehealth: Payer: Self-pay | Admitting: Family Medicine

## 2020-02-19 ENCOUNTER — Other Ambulatory Visit: Payer: Self-pay

## 2020-02-19 ENCOUNTER — Ambulatory Visit (HOSPITAL_COMMUNITY)
Admission: RE | Admit: 2020-02-19 | Discharge: 2020-02-19 | Disposition: A | Discharge status: Home / Self Care | Payer: Medicare Other | Source: Ambulatory Visit | Attending: Gastroenterology | Admitting: Gastroenterology

## 2020-02-19 ENCOUNTER — Encounter: Payer: Self-pay | Admitting: Gastroenterology

## 2020-02-19 DIAGNOSIS — K760 Fatty (change of) liver, not elsewhere classified: Secondary | ICD-10-CM | POA: Diagnosis not present

## 2020-02-19 DIAGNOSIS — Z8719 Personal history of other diseases of the digestive system: Secondary | ICD-10-CM | POA: Diagnosis not present

## 2020-02-19 DIAGNOSIS — Z9889 Other specified postprocedural states: Secondary | ICD-10-CM | POA: Insufficient documentation

## 2020-02-19 DIAGNOSIS — R1084 Generalized abdominal pain: Secondary | ICD-10-CM | POA: Diagnosis not present

## 2020-02-19 DIAGNOSIS — K5730 Diverticulosis of large intestine without perforation or abscess without bleeding: Secondary | ICD-10-CM | POA: Diagnosis not present

## 2020-02-19 MED ORDER — IOHEXOL 300 MG/ML  SOLN
100.0000 mL | Freq: Once | INTRAMUSCULAR | Status: AC | PRN
  Administered 2020-02-19: 100 mL via INTRAVENOUS

## 2020-02-19 NOTE — Telephone Encounter (Signed)
Patient calling in regards to glimepiride. It looks like prescription was not sent e-file.   glimepiride (AMARYL) 2 MG tablet [[id number]]    [facility] [place], [place] AT [facility] [facility]  [address], [place] [address]  Phone:  [phone] Fax:  [phone]

## 2020-02-20 MED ORDER — GLIMEPIRIDE 2 MG PO TABS: 2.0000 mg | ORAL_TABLET | Freq: Every day | ORAL | 1 refills | Status: DC

## 2020-02-20 NOTE — Telephone Encounter (Signed)
Patient is calling to check the status of medication refill, Patient is currently out of medication .  glimepiride (AMARYL) 2 MG tablet [[id number]    Please advise

## 2020-02-20 NOTE — Telephone Encounter (Signed)
Sent as requested. Patient have been notified.

## 2020-02-20 NOTE — Progress Notes (Signed)
____________________________________________________________  Attending physician addendum:  Thank you for sending this case to me. I have reviewed the entire note and agree with the plan.  I agree with single episode sounds like benign ano-rectal bleeding. I certainly need to see her if it recurs. Please send me a CT scan result note after you have reviewed the report.  Wilfrid Lund, MD  ____________________________________________________________

## 2020-03-18 DIAGNOSIS — E1122 Type 2 diabetes mellitus with diabetic chronic kidney disease: Secondary | ICD-10-CM | POA: Diagnosis not present

## 2020-03-18 DIAGNOSIS — I129 Hypertensive chronic kidney disease with stage 1 through stage 4 chronic kidney disease, or unspecified chronic kidney disease: Secondary | ICD-10-CM | POA: Diagnosis not present

## 2020-03-18 DIAGNOSIS — N181 Chronic kidney disease, stage 1: Secondary | ICD-10-CM | POA: Diagnosis not present

## 2020-03-20 ENCOUNTER — Other Ambulatory Visit: Payer: Self-pay | Admitting: Family

## 2020-04-24 ENCOUNTER — Other Ambulatory Visit: Payer: Self-pay | Admitting: Family

## 2020-04-29 ENCOUNTER — Telehealth (INDEPENDENT_AMBULATORY_CARE_PROVIDER_SITE_OTHER): Payer: Medicare Other | Admitting: Family Medicine

## 2020-04-29 DIAGNOSIS — R059 Cough, unspecified: Secondary | ICD-10-CM | POA: Diagnosis not present

## 2020-04-29 DIAGNOSIS — R0981 Nasal congestion: Secondary | ICD-10-CM | POA: Diagnosis not present

## 2020-04-29 MED ORDER — BENZONATATE 100 MG PO CAPS: 100.0000 mg | ORAL_CAPSULE | Freq: Three times a day (TID) | ORAL | 0 refills | Status: DC | PRN

## 2020-04-29 MED ORDER — AMOXICILLIN-POT CLAVULANATE 875-125 MG PO TABS: 1.0000 | ORAL_TABLET | Freq: Two times a day (BID) | ORAL | 0 refills | Status: DC

## 2020-04-29 NOTE — Patient Instructions (Signed)
  HOME CARE TIPS:  -[place] testing information: https://www.[facility].org/ OR (828)023-1384 Most pharmacies also offer testing and home test kits. Schedule prompt follow-up video visit with your primary care office or with [facility] if you have a positive Covid test.  There are treatment options for COVID-19 for high risk patients, however they are best given within the first 5 to 7 days of sickness.  -I sent the medication(s) we discussed to your pharmacy: Meds ordered this encounter  Medications  . amoxicillin-clavulanate (AUGMENTIN) 875-125 MG tablet    Sig: Take 1 tablet by mouth 2 (two) times daily.    Dispense:  20 tablet    Refill:  0  . benzonatate (TESSALON PERLES) 100 MG capsule    Sig: Take 1 capsule (100 mg total) by mouth 3 (three) times daily as needed.    Dispense:  20 capsule    Refill:  0     -can use tylenol  if needed for fevers, aches and pains per instructions  -can use nasal saline a few times per day if you have nasal congestion; sometimes  a short course of Afrin nasal spray for 3 days can help with symptoms as well  -stay hydrated, drink plenty of fluids and eat small healthy meals - avoid dairy  -can take 1000 IU (86mcg) Vit D3 and 100-500 mg of Vit C daily per instructions  -If the Covid test is positive, check out the CDC website for more information on home care, transmission and treatment for COVID19  -follow up with your doctor in 2-3 days unless improving and feeling better  -stay home while sick, except to seek medical care, and if you have [place] ideally it would be best to stay home for a full 10 days since the onset of symptoms PLUS one day of no fever and feeling better. Wear a good mask (such as N95 or KN95) if around others to reduce the risk of transmission.  It was nice to meet you today, and I really hope you are feeling better soon. I help [facility] out with telemedicine visits on Tuesdays and  Thursdays and am available for visits on those days. If you have any concerns or questions following this visit please schedule a follow up visit with your Primary Care doctor or seek care at a local urgent care clinic to avoid delays in care.    Seek in person care or schedule a follow up video visit promptly if your symptoms worsen, new concerns arise or you are not improving with treatment. Call 911 and/or seek emergency care if your symptoms are severe or life threatening.

## 2020-04-29 NOTE — Progress Notes (Signed)
Virtual Visit via Telephone Note  I connected with Megan [name] on 04/29/20 at 11:20 AM EDT by telephone and verified that I am speaking with the correct person using two identifiers.   I discussed the limitations, risks, security and privacy concerns of performing an evaluation and management service by telephone and the availability of in person appointments. I also discussed with the patient that there may be a patient responsible charge related to this service. The patient expressed understanding and agreed to proceed.  Location patient: home, [place] Location provider: work or home office Participants present for the call: patient, provider Patient did not have a visit with me in the prior 7 days to address this/these issue(s).   History of Present Illness:  Acute telemedicine visit for cough and sinus congestion: -Onset: chronic "hayfever" the last few weeks, but worse the last 2 days - just returned from [place] -reports gets this every year - dx with sinus infection and usually treated with antibiotic per her report -Symptoms include: nasal congestion, cough, PND, thick nasal congestion, ears are clogged, ? SOB when first woke up -Denies: CP, SOB currently, fever, body aches, NVD, inability to eat/drink/get out of bed -Has tried: zyrtec, flonase -Pertinent past medical history: see below - htn, DM, hx CAP, obesity -Pertinent medication allergies:asa -COVID-19 vaccine status: fully vaccinated and had booster; also had flu shot   Observations/Objective: Patient sounds cheerful and well on the phone. I do not appreciate any SOB. Speech and thought processing are grossly intact. Patient reported vitals:  Assessment and Plan:  Nasal congestion  Cough  -we discussed possible serious and likely etiologies, options for evaluation and workup, limitations of telemedicine visit vs in person visit, treatment, treatment risks and precautions. Pt prefers to treat via telemedicine  empirically rather than in person at this moment.  Query viral illness, COVID-19, evolving sinusitis versus other.  She was feeling okay prior to the last several days, so viral illness is high on the list.  Did walk her through how to do a Covid test, and advised that she schedule follow-up video visit through her primary care office or [facility] if positive to discuss treatment, given her past medical history.  Discussed potential complications, treatment options and precautions.  Opted to start with symptomatic care with nasal saline, Tessalon for cough.  Sent antibiotic in case symptoms are worsening or not improving, per her request, after lengthy discussion of risks versus benefit and appropriate use. Work/School slipped offered:declined Scheduled follow up with PCP offered: Agrees to follow-up if needed Advised to seek prompt in person care if worsening, new symptoms arise, or if is not improving with treatment. Advised of options for inperson care in case PCP office not available. Did let the patient know that I only do telemedicine shifts for [facility] on Tuesdays and Thursdays and advised a follow up visit with PCP or at an [facility] if has further questions or concerns.   Follow Up Instructions:  I did not refer this patient for an OV with me in the next 24 hours for this/these issue(s).  I discussed the assessment and treatment plan with the patient. The patient was provided an opportunity to ask questions and all were answered. The patient agreed with the plan and demonstrated an understanding of the instructions.   I spent 18 minutes on the date of this visit in the care of this patient. See summary of tasks completed to properly care for this patient in the detailed notes above which also  included counseling of above, review of PMH, medications, allergies, evaluation of the patient and ordering and/or  instructing patient on testing and care options.     Megan Kern, DO

## 2020-05-26 DIAGNOSIS — Z20822 Contact with and (suspected) exposure to covid-19: Secondary | ICD-10-CM | POA: Diagnosis not present

## 2020-06-01 ENCOUNTER — Telehealth: Payer: Self-pay | Admitting: *Deleted

## 2020-06-01 ENCOUNTER — Other Ambulatory Visit: Payer: Self-pay

## 2020-06-01 ENCOUNTER — Encounter: Payer: Self-pay | Admitting: Family Medicine

## 2020-06-01 ENCOUNTER — Ambulatory Visit (INDEPENDENT_AMBULATORY_CARE_PROVIDER_SITE_OTHER): Payer: Medicare Other | Admitting: Family Medicine

## 2020-06-01 VITALS — BP 142/76 | HR 89 | Temp 98.9°F | Resp 16 | Ht 65.0 in | Wt 177.1 lb

## 2020-06-01 DIAGNOSIS — I10 Essential (primary) hypertension: Secondary | ICD-10-CM

## 2020-06-01 NOTE — Telephone Encounter (Signed)
Patient has an appointment today with Dr. Wendling 

## 2020-06-01 NOTE — Progress Notes (Signed)
Chief Complaint  Patient presents with  . Hypertension    BP issues    Subjective Megan [name] is a 65 y.o. female who presents for hypertension follow up. She does monitor home blood pressures. Blood pressures ranging from 120-130's/80's on average. Yesterday, it was 156/90. 144/80's today.  She is compliant with medications- Norvasc 10 mg/d, losartan 100 mg/d. Patient has these side effects of medication: none She is usually adhering to a healthy diet overall. Current exercise: was walking No CP or SOB.    Past Medical History:  Diagnosis Date  . Allergy   . Arthritis    fingers  . Colon polyp   . Diabetes mellitus   . Diverticulosis   . GERD (gastroesophageal reflux disease)    occasionally  . H/O hiatal hernia    ?  Marland Kitchen Hemorrhoids   . Hyperlipidemia    takes fish oil  . Hypertension   . Infectious colitis   . Ventral hernia     Exam BP (!) 142/76 (BP Location: Left Arm, Patient Position: Sitting, Cuff Size: Small)   Pulse 89   Temp 98.9 F (37.2 C) (Oral)   Resp 16   Ht 5\' 5"  (1.651 m)   Wt 177 lb 2 oz (80.3 kg)   SpO2 98%   BMI 29.48 kg/m  General:  well developed, well nourished, in no apparent distress Heart: RRR, no bruits, no LE edema Lungs: clear to auscultation, no accessory muscle use Psych: well oriented with normal range of affect and appropriate judgment/insight  Essential hypertension  Chronic, currently unstable for her. Cont Norvasc 10 mg/d and losartan 100 mg/d. Monitor BP at home. If it does not cont to come down over next few days, will add Toprol XL 50 mg/d. Counseled on diet and exercise. F/u as originally scheduled on 6/2.  The patient voiced understanding and agreement to the plan.  [place], DO 06/01/20  1:22 PM

## 2020-06-01 NOTE — Telephone Encounter (Signed)
Who Is Calling Patient / Member / Family / Caregiver Call Type Triage / Clinical Relationship To Patient Self Return Phone Number [phone] (Primary) Chief Complaint Blood Pressure High Reason for Call Symptomatic / Request for Health Information Initial Comment Caller patients states she has high blood pressure, sweaty. 156/90 Translation No Nurse Assessment Nurse: Ysidro Evert, RN, Levada Dy Date/Time (Eastern Time): 05/31/2020 3:42:02 PM Confirm and document reason for call. If symptomatic, describe symptoms. ---Caller states her blood pressure is 156/90. She had a headache which was resolved with tylenol

## 2020-06-01 NOTE — Patient Instructions (Addendum)
I think your blood pressure will continue to go back down to normal.   Send me a message by the end of the week if your blood pressure has not gotten back to normal or goes up again.  Keep the diet clean and stay active.  Let us know if you need anything.

## 2020-06-09 ENCOUNTER — Ambulatory Visit (HOSPITAL_COMMUNITY)
Admission: EM | Admit: 2020-06-09 | Discharge: 2020-06-09 | Disposition: A | Discharge status: Home / Self Care | Payer: Medicare Other

## 2020-06-09 ENCOUNTER — Encounter (HOSPITAL_COMMUNITY): Payer: Self-pay | Admitting: Emergency Medicine

## 2020-06-09 ENCOUNTER — Other Ambulatory Visit: Payer: Self-pay

## 2020-06-09 DIAGNOSIS — I10 Essential (primary) hypertension: Secondary | ICD-10-CM

## 2020-06-09 NOTE — Discharge Instructions (Addendum)
Continue monitoring her blood pressure.  If you have additional elevated readings please recheck this in about 20 minutes.  Bring your blood pressure cuff to your doctors office so they can compare this to the manual reading.  Avoid salt.  If you have persistently elevated readings with associated headaches, dizziness, chest pain, shortness of breath go to the emergency room.

## 2020-06-09 NOTE — ED Provider Notes (Signed)
[facility]    CSN: [id number] Arrival date & time: 06/09/20  1738      History   Chief Complaint Chief Complaint  Patient presents with  . Hypertension    HPI Megan [name] is a 65 y.o. female.   Patient presents today with a several hour history of elevated blood pressure reading.  Reports she is working with her PCP to monitor her blood pressure at home and make medication adjustments and this is generally been running between 035 597 systolic and 41-638 diastolic.  Earlier today she took her blood pressure and this was 175/97.  She denies experiencing any pain or anxiety at the time of taking blood pressure.  She immediately came to the emergent care for further evaluation due to concern that this could cause a stroke.  She denies any symptoms including chest pain, headache, dizziness, vision changes, leg swelling.  On intake today blood pressure was more appropriate.  She does have follow-up scheduled with PCP 06/17/2020 and intends to keep this appointment.  She denies any sudden increase in caffeine or sodium.     Past Medical History:  Diagnosis Date  . Allergy   . Arthritis    fingers  . Colon polyp   . Diabetes mellitus   . Diverticulosis   . GERD (gastroesophageal reflux disease)    occasionally  . H/O hiatal hernia    ?  Marland Kitchen Hemorrhoids   . Hyperlipidemia    takes fish oil  . Hypertension   . Infectious colitis   . Ventral hernia     Patient Active Problem List   Diagnosis Date Noted  . Generalized abdominal pain 02/19/2020  . History of hernia repair 02/19/2020  . Hair loss 02/17/2020  . DM (diabetes mellitus) type II uncontrolled, periph vascular disorder ([place]) 02/17/2020  . Diabetes mellitus without complication ([place]) 45/36/4680  . Type 2 diabetes mellitus with hyperglycemia, without long-term current use of insulin ([place]) 09/02/2019  . Dyslipidemia 09/02/2019  . Uncontrolled type 2 diabetes mellitus with hyperglycemia ([place]) 11/05/2018   . Fatigue 11/05/2018  . Acute non-recurrent pansinusitis 04/25/2018  . Bronchitis 05/21/2017  . CAP (community acquired pneumonia) 10/15/2013  . Wheezing 09/29/2013  . Obesity (BMI 30-39.9) 07/01/2013  . Pain, knee 10/02/2012  . Gastrocnemius muscle strain 10/02/2012  . Wrist sprain 10/02/2012  . Thyroid nodule 05/03/2012  . Rash and nonspecific skin eruption 03/14/2012  . HAIR LOSS 03/28/2010  . COLONIC POLYPS, HX OF 11/09/2009  . HEMANGIOMA, HEPATIC 11/04/2009  . UNSPECIFIED ANEMIA 11/04/2009  . DIVERTICULOSIS, COLON 11/04/2009  . FATTY LIVER DISEASE 11/04/2009  . PALPITATIONS 09/06/2009  . Chronic fatigue 08/07/2008  . Allergic rhinitis 04/10/2007  . Diabetes mellitus type II, controlled ([place]) 12/21/2006  . Hyperlipidemia 04/12/2006  . Essential hypertension 04/12/2006    Past Surgical History:  Procedure Laterality Date  . colonscopy    . DIAGNOSTIC LAPAROSCOPY    . FOOT SURGERY Bilateral    hammer toes  . HEMORRHOID SURGERY    . HERNIA REPAIR  01/20/2011   incisional  . INCISIONAL HERNIA REPAIR  01/20/2011   Procedure: LAPAROSCOPIC INCISIONAL HERNIA;  Surgeon: Judieth Keens, DO;  Location: [facility];  Service: General;  Laterality: N/A;  Laparoscopic repair of a recurrent incisional hernia with Mesh  . INGUINAL HERNIA REPAIR     x's 3  . VENTRAL HERNIA REPAIR      OB History   No obstetric history on file.      Home Medications  Prior to Admission medications   Medication Sig Start Date End Date Taking? Authorizing Provider  amLODipine (NORVASC) 10 MG tablet Take 1 tablet (10 mg total) by mouth daily. 04/26/20   Roma Schanz R, DO  b complex vitamins tablet Take 1 tablet by mouth daily.    [provider]  Cholecalciferol (VITAMIN D3) 250 MCG (10000 UT) TABS Take by mouth daily.    [provider]  Fish Oil-Cholecalciferol (FISH OIL + D3 PO) Take 1,200 mg by mouth daily.     [provider]  fluticasone (FLONASE) 50  MCG/ACT nasal spray Place 2 sprays into both nostrils daily. 04/25/18   [name], DO  glimepiride (AMARYL) 2 MG tablet Take 1 tablet (2 mg total) by mouth daily before breakfast. 02/20/20   Shamleffer, Melanie Crazier, MD  loratadine (CLARITIN) 10 MG tablet Take 1 tablet (10 mg total) by mouth daily. 08/09/18   [name], DO  losartan (COZAAR) 100 MG tablet TK 1 T PO D 10/18/18   [provider]  montelukast (SINGULAIR) 10 MG tablet Take 1 tablet (10 mg total) by mouth at bedtime. 04/30/17   [name], DO  Multiple Vitamin (MULTIVITAMIN) tablet Take 1 tablet by mouth daily. OVER 50 VITAMIN    [provider]  sitaGLIPtin-metformin (JANUMET) 50-1000 MG tablet TAKE 1 TABLET BY MOUTH TWICE DAILY WITH A MEAL 12/26/19   Carollee Herter, Alferd Apa, DO  traZODone (DESYREL) 50 MG tablet Take 0.5-1 tablets (25-50 mg total) by mouth at bedtime as needed for sleep. 11/05/18   [name], DO    Family History Family History  Problem Relation Age of Onset  . Breast cancer Mother   . Diabetes Mother   . Dementia Mother   . Liver cancer Mother   . Heart disease Father        heart attack  . Diabetes Brother   . Breast cancer Sister   . Colon cancer Neg Hx   . Stomach cancer Neg Hx     Social History Social History   Tobacco Use  . Smoking status: Former Smoker    Packs/day: 1.00    Years: 20.00    Pack years: 20.00    Types: Cigarettes    Quit date: 01/05/1996    Years since quitting: 24.4  . Smokeless tobacco: Never Used  Vaping Use  . Vaping Use: Never used  Substance Use Topics  . Alcohol use: Not Currently    Alcohol/week: 4.0 standard drinks    Types: 4 Glasses of wine per week  . Drug use: No     Allergies   Aspirin   Review of Systems Review of Systems  Constitutional: Negative for activity change, appetite change, fatigue and fever.  Eyes: Negative for visual disturbance.  Respiratory: Negative for cough and  shortness of breath.   Cardiovascular: Negative for chest pain, palpitations and leg swelling.  Gastrointestinal: Negative for abdominal pain, diarrhea, nausea and vomiting.  Neurological: Negative for dizziness, light-headedness and headaches.     Physical Exam Triage Vital Signs ED Triage Vitals  Enc Vitals Group     BP 06/09/20 1758 140/68     Pulse Rate 06/09/20 1758 83     Resp 06/09/20 1758 17     Temp --      Temp Source 06/09/20 1758 Oral     SpO2 06/09/20 1758 100 %     Weight --      Height --  Head Circumference --      Peak Flow --      Pain Score 06/09/20 1756 0     Pain Loc --      Pain Edu? --      Excl. in [place]? --    No data found.  Updated Vital Signs BP 140/68 (BP Location: Right Arm)   Pulse 83   Resp 17   SpO2 100%   Visual Acuity Right Eye Distance:   Left Eye Distance:   Bilateral Distance:    Right Eye Near:   Left Eye Near:    Bilateral Near:     Physical Exam Vitals reviewed.  Constitutional:      General: She is awake. She is not in acute distress.    Appearance: Normal appearance. She is not ill-appearing.     Comments: Very pleasant female appears stated age in no acute distress  HENT:     Head: Normocephalic and atraumatic.  Eyes:     Extraocular Movements: Extraocular movements intact.     Conjunctiva/sclera: Conjunctivae normal.     Pupils: Pupils are equal, round, and reactive to light.  Cardiovascular:     Rate and Rhythm: Normal rate and regular rhythm.     Pulses:          Radial pulses are 2+ on the right side and 2+ on the left side.       Posterior tibial pulses are 2+ on the right side and 2+ on the left side.     Heart sounds: No murmur heard.   Pulmonary:     Effort: Pulmonary effort is normal.     Breath sounds: Normal breath sounds. No wheezing, rhonchi or rales.     Comments: Clear to auscultation bilaterally Abdominal:     Palpations: Abdomen is soft.     Tenderness: There is no abdominal tenderness.   Musculoskeletal:     Right lower leg: No edema.     Left lower leg: No edema.  Psychiatric:        Behavior: Behavior is cooperative.      UC Treatments / Results  Labs (all labs ordered are listed, but only abnormal results are displayed) Labs Reviewed - No data to display  EKG   Radiology No results found.  Procedures Procedures (including critical care time)  Medications Ordered in UC Medications - No data to display  Initial Impression / Assessment and Plan / UC Course  I have reviewed the triage vital signs and the nursing notes.  Pertinent labs & imaging results that were available during my care of the patient were reviewed by me and considered in my medical decision making (see chart for details).     Blood pressure on intake was 140/68 and patient is currently asymptomatic.  Will not make any medication changes at this time.  She was encouraged to continue monitoring her blood pressure regularly but if she notices an elevated reading she should recheck this in 20-60 minutes to ensure it is accurate and not related to the machine.  Recommended she avoid salt and continue with regular physical activity.  She is to bring blood pressure monitoring deviceto her PCP appointment to compare to manual reading.  Discussed alarm symptoms that would warrant emergent evaluation.  Strict return precautions given to which patient expressed understanding.  Final Clinical Impressions(s) / UC Diagnoses   Final diagnoses:  Elevated blood pressure reading in office with diagnosis of hypertension  Essential hypertension  Discharge Instructions     Continue monitoring her blood pressure.  If you have additional elevated readings please recheck this in about 20 minutes.  Bring your blood pressure cuff to your doctors office so they can compare this to the manual reading.  Avoid salt.  If you have persistently elevated readings with associated headaches, dizziness, chest pain,  shortness of breath go to the emergency room.    ED Prescriptions    None     PDMP not reviewed this encounter.   Terrilee Croak, PA-C 06/09/20 1858

## 2020-06-09 NOTE — ED Triage Notes (Signed)
Pt is present today with a high blood pressure reading . Pt states that when she checked it today it was 175/97 HR 102. Pt states that the only sx she had today was a HA.

## 2020-06-15 DIAGNOSIS — Z9189 Other specified personal risk factors, not elsewhere classified: Secondary | ICD-10-CM

## 2020-06-15 NOTE — Progress Notes (Signed)
[facility])                                            [place] Team                                        Statin Quality Measure Assessment    06/15/2020  Megan [name] 05-May-1955 MRN: [id number]  Next Appt with PCP: 06/17/2020 Per review of chart and payor information, this patient has been flagged for non-adherence to the following CMS Quality Measure:   [x]  Statin Use in Persons with Diabetes  []  Statin Use in Persons with Cardiovascular Disease  The 10-year ASCVD risk score [name] DC Jr., et al., 2013) is: 24%   Values used to calculate the score:     Age: 65 years     Sex: Female     Is Non-Hispanic African American: Yes     Diabetic: Yes     Tobacco smoker: No (former smother > 5 years)     Systolic Blood Pressure: 309 mmHg     Is BP treated: Yes     HDL Cholesterol on 01/26/2020: 54.5 mg/dL     Total Cholesterol on 01/26/2020: 161 mg/dL    Currently prescribed statin:  []  Yes [x]  No     Comments: N/A   History of statin use:            [x]  Yes []  No   Comments: Simvastatin (unclear why this medication was stopped). Per prior documentation, patient declined statin. Please consider statin discussion this year.  Please consider ONE of the following recommendations:   Initiate high intensity statin Atorvastatin 40mg  once daily, #90, 3 refills   Rosuvastatin 20mg  once daily, #90, 3 refills    Initiate moderate intensity          statin with reduced frequency if prior          statin intolerance 1x weekly, #13, 3 refills   2x weekly, #26, 3 refills   3x weekly, #39, 3 refills    Code for past statin intolerance or other exclusions (required annually)  Drug Induced Myopathy G72.0   Myositis, unspecified M60.9   Rhabdomyolysis M62.82   Prediabetes R73.03   Adverse effect of antihyperlipidemic and antiarteriosclerotic drugs, initial encounter M07.6K0S    Thank you for your time,  Kristeen Miss,  [place] Cell: [phone]

## 2020-06-17 ENCOUNTER — Ambulatory Visit (INDEPENDENT_AMBULATORY_CARE_PROVIDER_SITE_OTHER): Payer: Medicare Other | Admitting: Family Medicine

## 2020-06-17 ENCOUNTER — Encounter: Payer: Self-pay | Admitting: Family Medicine

## 2020-06-17 ENCOUNTER — Other Ambulatory Visit: Payer: Self-pay

## 2020-06-17 ENCOUNTER — Ambulatory Visit: Payer: Medicare Other | Attending: Internal Medicine

## 2020-06-17 VITALS — BP 118/68 | HR 87 | Temp 97.9°F | Resp 18 | Ht 65.0 in | Wt 176.6 lb

## 2020-06-17 DIAGNOSIS — E119 Type 2 diabetes mellitus without complications: Secondary | ICD-10-CM | POA: Diagnosis not present

## 2020-06-17 DIAGNOSIS — E118 Type 2 diabetes mellitus with unspecified complications: Secondary | ICD-10-CM | POA: Diagnosis not present

## 2020-06-17 DIAGNOSIS — I10 Essential (primary) hypertension: Secondary | ICD-10-CM

## 2020-06-17 DIAGNOSIS — E1169 Type 2 diabetes mellitus with other specified complication: Secondary | ICD-10-CM

## 2020-06-17 DIAGNOSIS — E785 Hyperlipidemia, unspecified: Secondary | ICD-10-CM | POA: Diagnosis not present

## 2020-06-17 LAB — HEMOGLOBIN A1C: Hgb A1c MFr Bld: 6.6 % — ABNORMAL HIGH (ref 4.6–6.5)

## 2020-06-17 NOTE — Patient Instructions (Signed)

## 2020-06-17 NOTE — Assessment & Plan Note (Signed)
Well controlled, no changes to meds. Encouraged heart healthy diet such as the DASH diet and exercise as tolerated.  °

## 2020-06-17 NOTE — Progress Notes (Signed)
Subjective:   By signing my name below, I, Shehryar Baig, attest that this documentation has been prepared under the direction and in the presence of Dr. Roma Schanz, DO. 06/17/2020   Patient ID: Megan [name], female    DOB: 04-11-1955, 65 y.o.   MRN: [id number]  Chief Complaint  Patient presents with  . Diabetes    HPI Patient is in today for a office visit.  She complains of a skin rash on her back. It is not itchy and does not cause her pain at this time. She also complains of a headache due to stress. On 06/09/2020 she measured her systolic blood pressure to be around 200 after she ate a pork chop and admitted herself into the ED. Her blood pressure has improved since her visit to the ED. BP Readings from Last 3 Encounters:  06/17/20 118/68  06/09/20 140/68  06/01/20 (!) 142/76    She continues to control her weight. She is managing her diet well and participates in exercise by walking daily. Her blood sugar is well managed at this time. She is due for the singles vaccine, pneumonia vaccine, and the 4th Covid-19 vaccine.   Past Medical History:  Diagnosis Date  . Allergy   . Arthritis    fingers  . Colon polyp   . Diabetes mellitus   . Diverticulosis   . GERD (gastroesophageal reflux disease)    occasionally  . H/O hiatal hernia    ?  Marland Kitchen Hemorrhoids   . Hyperlipidemia    takes fish oil  . Hypertension   . Infectious colitis   . Ventral hernia     Past Surgical History:  Procedure Laterality Date  . colonscopy    . DIAGNOSTIC LAPAROSCOPY    . FOOT SURGERY Bilateral    hammer toes  . HEMORRHOID SURGERY    . HERNIA REPAIR  01/20/2011   incisional  . INCISIONAL HERNIA REPAIR  01/20/2011   Procedure: LAPAROSCOPIC INCISIONAL HERNIA;  Surgeon: Judieth Keens, DO;  Location: [facility];  Service: General;  Laterality: N/A;  Laparoscopic repair of a recurrent incisional hernia with Mesh  . INGUINAL HERNIA REPAIR     x's 3  . VENTRAL HERNIA REPAIR       Family History  Problem Relation Age of Onset  . Breast cancer Mother   . Diabetes Mother   . Dementia Mother   . Liver cancer Mother   . Heart disease Father        heart attack  . Diabetes Brother   . Breast cancer Sister   . Colon cancer Neg Hx   . Stomach cancer Neg Hx     Social History   Socioeconomic History  . Marital status: Divorced    Spouse name: Not on file  . Number of children: 1  . Years of education: Not on file  . Highest education level: Not on file  Occupational History  . Occupation: assessment specialist-retired    Comment: city of [place]  Tobacco Use  . Smoking status: Former Smoker    Packs/day: 1.00    Years: 20.00    Pack years: 20.00    Types: Cigarettes    Quit date: 01/05/1996    Years since quitting: 24.4  . Smokeless tobacco: Never Used  Vaping Use  . Vaping Use: Never used  Substance and Sexual Activity  . Alcohol use: Not Currently    Alcohol/week: 4.0 standard drinks    Types: 4 Glasses of wine per  week  . Drug use: No  . Sexual activity: Not on file  Other Topics Concern  . Not on file  Social History Narrative   Exercise 3 times a week   Social Determinants of Health   Financial Resource Strain: Not on file  Food Insecurity: Not on file  Transportation Needs: Not on file  Physical Activity: Not on file  Stress: Not on file  Social Connections: Not on file  Intimate Partner Violence: Not on file    Outpatient Medications Prior to Visit  Medication Sig Dispense Refill  . amLODipine (NORVASC) 10 MG tablet Take 1 tablet (10 mg total) by mouth daily. 90 tablet 1  . b complex vitamins tablet Take 1 tablet by mouth daily.    . Cholecalciferol (VITAMIN D3) 250 MCG (10000 UT) TABS Take by mouth daily.    Marland Kitchen Fish Oil-Cholecalciferol (FISH OIL + D3 PO) Take 1,200 mg by mouth daily.     . fluticasone (FLONASE) 50 MCG/ACT nasal spray Place 2 sprays into both nostrils daily. 16 g 6  . glimepiride (AMARYL) 2 MG tablet Take  1 tablet (2 mg total) by mouth daily before breakfast. 90 tablet 1  . loratadine (CLARITIN) 10 MG tablet Take 1 tablet (10 mg total) by mouth daily. 30 tablet 11  . losartan (COZAAR) 100 MG tablet TK 1 T PO D    . montelukast (SINGULAIR) 10 MG tablet Take 1 tablet (10 mg total) by mouth at bedtime. 30 tablet 3  . Multiple Vitamin (MULTIVITAMIN) tablet Take 1 tablet by mouth daily. OVER 50 VITAMIN    . sitaGLIPtin-metformin (JANUMET) 50-1000 MG tablet TAKE 1 TABLET BY MOUTH TWICE DAILY WITH A MEAL 180 tablet 1  . traZODone (DESYREL) 50 MG tablet Take 0.5-1 tablets (25-50 mg total) by mouth at bedtime as needed for sleep. 30 tablet 3   No facility-administered medications prior to visit.    Allergies  Allergen Reactions  . Aspirin Nausea Only    Review of Systems  Musculoskeletal: Positive for myalgias (Below the axillay).  Skin: Positive for rash (back). Negative for itching.  Neurological: Positive for headaches.       Objective:    Physical Exam Constitutional:      General: She is not in acute distress.    Appearance: Normal appearance. She is not ill-appearing.  HENT:     Head: Normocephalic and atraumatic.     Right Ear: External ear normal.     Left Ear: External ear normal.  Eyes:     Extraocular Movements: Extraocular movements intact.     Pupils: Pupils are equal, round, and reactive to light.  Cardiovascular:     Rate and Rhythm: Normal rate and regular rhythm.     Pulses: Normal pulses.     Heart sounds: Normal heart sounds. No murmur heard. No gallop.   Pulmonary:     Effort: Pulmonary effort is normal. No respiratory distress.     Breath sounds: Normal breath sounds. No wheezing, rhonchi or rales.  Skin:    General: Skin is warm and dry.     Comments: 2 Slightly raised 1 cm hyperpigmented mole on back. Non-itchy nor painful  Neurological:     Mental Status: She is alert and oriented to person, place, and time.  Psychiatric:        Behavior: Behavior  normal.     BP 118/68 (BP Location: Right Arm, Patient Position: Sitting, Cuff Size: Normal)   Pulse 87   Temp 97.9 F (  36.6 C) (Oral)   Resp 18   Ht 5\' 5"  (1.651 m)   Wt 176 lb 9.6 oz (80.1 kg)   SpO2 98%   BMI 29.39 kg/m  Wt Readings from Last 3 Encounters:  06/17/20 176 lb 9.6 oz (80.1 kg)  06/01/20 177 lb 2 oz (80.3 kg)  02/17/20 177 lb 4 oz (80.4 kg)    Diabetic Foot Exam - Simple   No data filed    Lab Results  Component Value Date   WBC 15.4 (H) 12/09/2019   HGB 11.3 (L) 12/09/2019   HCT 36.2 12/09/2019   PLT 412.0 (H) 12/09/2019   GLUCOSE 112 (H) 01/26/2020   CHOL 161 01/26/2020   TRIG 131.0 01/26/2020   HDL 54.50 01/26/2020   LDLDIRECT 88.0 04/21/2014   LDLCALC 81 01/26/2020   ALT 19 01/26/2020   AST 17 01/26/2020   NA 139 01/26/2020   K 4.1 01/26/2020   CL 104 01/26/2020   CREATININE 0.79 01/26/2020   BUN 11 01/26/2020   CO2 28 01/26/2020   TSH 0.56 02/17/2020   HGBA1C 5.9 (A) 02/17/2020   MICROALBUR 0.8 07/14/2019    Lab Results  Component Value Date   TSH 0.56 02/17/2020   Lab Results  Component Value Date   WBC 15.4 (H) 12/09/2019   HGB 11.3 (L) 12/09/2019   HCT 36.2 12/09/2019   MCV 69.6 (L) 12/09/2019   PLT 412.0 (H) 12/09/2019   Lab Results  Component Value Date   NA 139 01/26/2020   K 4.1 01/26/2020   CO2 28 01/26/2020   GLUCOSE 112 (H) 01/26/2020   BUN 11 01/26/2020   CREATININE 0.79 01/26/2020   BILITOT 1.0 01/26/2020   ALKPHOS 55 01/26/2020   AST 17 01/26/2020   ALT 19 01/26/2020   PROT 7.5 01/26/2020   ALBUMIN 4.3 01/26/2020   CALCIUM 9.6 01/26/2020   GFR 77.71 01/26/2020   Lab Results  Component Value Date   CHOL 161 01/26/2020   Lab Results  Component Value Date   HDL 54.50 01/26/2020   Lab Results  Component Value Date   LDLCALC 81 01/26/2020   Lab Results  Component Value Date   TRIG 131.0 01/26/2020   Lab Results  Component Value Date   CHOLHDL 3 01/26/2020   Lab Results  Component Value Date    HGBA1C 5.9 (A) 02/17/2020       Assessment & Plan:   Problem List Items Addressed This Visit      Unprioritized   Diabetes mellitus type II, controlled ([place])    Per endo      Essential hypertension    Well controlled, no changes to meds. Encouraged heart healthy diet such as the DASH diet and exercise as tolerated.       Hyperlipidemia    Tolerating statin, encouraged heart healthy diet, avoid trans fats, minimize simple carbs and saturated fats. Increase exercise as tolerated       Other Visit Diagnoses    Hyperlipidemia associated with type 2 diabetes mellitus ([place])    -  Primary   Relevant Orders   Lipid panel   Hemoglobin A1c   Comprehensive metabolic panel   Type 2 diabetes mellitus without complication, without long-term current use of insulin ([facility])       Relevant Orders   Lipid panel   Hemoglobin A1c   Comprehensive metabolic panel   Primary hypertension       Relevant Orders   Lipid panel   Hemoglobin A1c  Comprehensive metabolic panel       No orders of the defined types were placed in this encounter.   I, Dr. Roma Schanz, DO, personally preformed the services described in this documentation.  All medical record entries made by the scribe were at my direction and in my presence.  I have reviewed the chart and discharge instructions (if applicable) and agree that the record reflects my personal performance and is accurate and complete. 06/17/2020   I,Shehryar Baig,acting as a scribe for [name], DO.,have documented all relevant documentation on the behalf of [name], DO,as directed by  [name], DO while in the presence of [name], DO.   [name], DO

## 2020-06-17 NOTE — Assessment & Plan Note (Signed)
Per endo °

## 2020-06-17 NOTE — Assessment & Plan Note (Signed)
Tolerating statin, encouraged heart healthy diet, avoid trans fats, minimize simple carbs and saturated fats. Increase exercise as tolerated 

## 2020-06-18 ENCOUNTER — Telehealth: Payer: Self-pay

## 2020-06-18 DIAGNOSIS — Z9189 Other specified personal risk factors, not elsewhere classified: Secondary | ICD-10-CM

## 2020-06-18 DIAGNOSIS — Z5320 Procedure and treatment not carried out because of patient's decision for unspecified reasons: Secondary | ICD-10-CM

## 2020-06-18 LAB — LIPID PANEL
Cholesterol: 155 mg/dL (ref 0–200)
HDL: 55.3 mg/dL (ref >39.00)
LDL Cholesterol: 74 mg/dL (ref 0–99)
NonHDL: 99.84
Total CHOL/HDL Ratio: 3
Triglycerides: 129 mg/dL (ref 0.0–149.0)
VLDL: 25.8 mg/dL (ref 0.0–40.0)

## 2020-06-18 LAB — COMPREHENSIVE METABOLIC PANEL
ALT: 17 U/L (ref 0–35)
AST: 18 U/L (ref 0–37)
Albumin: 4.2 g/dL (ref 3.5–5.2)
Alkaline Phosphatase: 59 U/L (ref 39–117)
BUN: 14 mg/dL (ref 6–23)
CO2: 26 mEq/L (ref 19–32)
Calcium: 9.6 mg/dL (ref 8.4–10.5)
Chloride: 105 mEq/L (ref 96–112)
Creatinine, Ser: 0.77 mg/dL (ref 0.40–1.20)
GFR: 79.92 mL/min (ref >60.00)
Glucose, Bld: 103 mg/dL — ABNORMAL HIGH (ref 70–99)
Potassium: 4.3 mEq/L (ref 3.5–5.1)
Sodium: 142 mEq/L (ref 135–145)
Total Bilirubin: 0.7 mg/dL (ref 0.2–1.2)
Total Protein: 7.5 g/dL (ref 6.0–8.3)

## 2020-06-18 NOTE — Progress Notes (Signed)
[facility])                                            [place] Team                                        Statin Quality Measure Assessment    06/18/2020  Megan [name] [birth year] [id number]  Per review of chart and payor information, this patient has been flagged for non-adherence to the following CMS Quality Measure:   [x]  Statin Use in Persons with Diabetes  []  Statin Use in Persons with Cardiovascular Disease  The 10-year ASCVD risk score [name] DC Brooke Bonito., et al., 2013) is: 16.1%   Values used to calculate the score:     Age: 65 years     Sex: Female     Is Non-Hispanic African American: Yes     Diabetic: Yes     Tobacco smoker: No     Systolic Blood Pressure: 163 mmHg     Is BP treated: Yes     HDL Cholesterol: 54.5 mg/dL     Total Cholesterol: 161 mg/dL (on 01/26/2020)  Currently prescribed statin:  []  Yes [x]  No     Comments: N/A  Pt had a prescription for simvastatin on file but never filled that medication. Patient stated she is not interested in taking statins d/t the side effect profile - I provided patient education of benefits and discussed the common side effects. Patient currently takes fish oil. PCP checked LDL yesterday and results are pending.   If clinically appropriate, pending lipid panel results showing patient significantly above LDL goal, please consider statin start discussion with patient.    Thank you for your time,  Kristeen Miss, [place] Cell: [phone]

## 2020-06-21 ENCOUNTER — Other Ambulatory Visit (HOSPITAL_BASED_OUTPATIENT_CLINIC_OR_DEPARTMENT_OTHER): Payer: Self-pay

## 2020-06-21 ENCOUNTER — Telehealth: Payer: Self-pay | Admitting: Family Medicine

## 2020-06-21 DIAGNOSIS — M7731 Calcaneal spur, right foot: Secondary | ICD-10-CM | POA: Diagnosis not present

## 2020-06-21 DIAGNOSIS — R2689 Other abnormalities of gait and mobility: Secondary | ICD-10-CM | POA: Diagnosis not present

## 2020-06-21 DIAGNOSIS — E139 Other specified diabetes mellitus without complications: Secondary | ICD-10-CM | POA: Diagnosis not present

## 2020-06-21 DIAGNOSIS — Z1231 Encounter for screening mammogram for malignant neoplasm of breast: Secondary | ICD-10-CM | POA: Diagnosis not present

## 2020-06-21 DIAGNOSIS — M79671 Pain in right foot: Secondary | ICD-10-CM | POA: Diagnosis not present

## 2020-06-21 NOTE — Telephone Encounter (Signed)
Labs have not been resulted. Pt informed

## 2020-06-21 NOTE — Telephone Encounter (Signed)
Patient would like lab results.

## 2020-06-22 ENCOUNTER — Other Ambulatory Visit: Payer: Self-pay | Admitting: Family Medicine

## 2020-06-22 DIAGNOSIS — E1165 Type 2 diabetes mellitus with hyperglycemia: Secondary | ICD-10-CM

## 2020-06-29 ENCOUNTER — Other Ambulatory Visit: Payer: Self-pay

## 2020-06-29 ENCOUNTER — Encounter: Payer: Self-pay | Admitting: Internal Medicine

## 2020-06-29 ENCOUNTER — Ambulatory Visit (INDEPENDENT_AMBULATORY_CARE_PROVIDER_SITE_OTHER): Payer: Medicare Other | Admitting: Internal Medicine

## 2020-06-29 VITALS — BP 140/70 | HR 87 | Ht 65.0 in | Wt 177.0 lb

## 2020-06-29 DIAGNOSIS — IMO0002 Reserved for concepts with insufficient information to code with codable children: Secondary | ICD-10-CM

## 2020-06-29 DIAGNOSIS — E1151 Type 2 diabetes mellitus with diabetic peripheral angiopathy without gangrene: Secondary | ICD-10-CM | POA: Diagnosis not present

## 2020-06-29 DIAGNOSIS — E1165 Type 2 diabetes mellitus with hyperglycemia: Secondary | ICD-10-CM

## 2020-06-29 MED ORDER — JANUMET 50-1000 MG PO TABS: ORAL_TABLET | ORAL | 3 refills | Status: DC

## 2020-06-29 MED ORDER — GLIMEPIRIDE 2 MG PO TABS: 2.0000 mg | ORAL_TABLET | Freq: Every day | ORAL | 3 refills | Status: DC

## 2020-06-29 NOTE — Progress Notes (Signed)
Name: Megan [name]  Age/ Sex: 65 y.o., female   MRN/ DOB: [id number], [birth year]     PCP: Megan Held, DO   Reason for Endocrinology Evaluation: Type 2 Diabetes Mellitus  Initial Endocrine Consultative Visit: 09/02/2019    PATIENT IDENTIFIER: Megan [name] [name] a 65 y.o. female with a past medical history of T2DM, HTN and Dyslipidemia. The patient has followed with Endocrinology clinic since 09/02/2019 for consultative assistance with management of her diabetes.  DIABETIC HISTORY:  Megan [name] was diagnosed with DM in 2008.She has been on Metformin, Glimepiride, repaglinide and Januvia . Her hemoglobin A1c has ranged from 6.5% in 2014, peaking at 7.4% in 10/2018  On her initial visit to our clinic she has an A1c of 7.3% . She was on Repaglinide which we stopped due to hypoglycemia, Decreased Glimepiride and continued Janumet.   SUBJECTIVE:   During the last visit (02/17/2020): A1c 5.9 % We decreased Glimepiride  and Continued Janumet      Today (06/29/2020): Megan [name] here for a follow up on diabetes management.  She checks her blood sugars 1 times daily, preprandial to breakfast. The patient has not had hypoglycemic episodes since the last clinic visit.  Denies nausea or diarrhea    HOME DIABETES REGIMEN:  Janumet 50-1000 mg BID Glimepiride 2 mg , 1 tabs daily     Statin: Declines  ACE-I/ARB: yes    METER DOWNLOAD SUMMARY: Unable to download  [id number]   DIABETIC COMPLICATIONS: Microvascular complications:   Denies: CKD, retinopathy, neuropathy Last Eye Exam: Completed 2020  Macrovascular complications:   Denies: CAD, CVA, PVD   HISTORY:  Past Medical History:  Past Medical History:  Diagnosis Date   Allergy    Arthritis    fingers   Colon polyp    Diabetes mellitus    Diverticulosis    GERD (gastroesophageal reflux disease)    occasionally   H/O hiatal hernia    ?   Hemorrhoids    Hyperlipidemia    takes fish oil    Hypertension    Infectious colitis    Ventral hernia    Past Surgical History:  Past Surgical History:  Procedure Laterality Date   colonscopy     DIAGNOSTIC LAPAROSCOPY     FOOT SURGERY Bilateral    hammer toes   HEMORRHOID SURGERY     HERNIA REPAIR  01/20/2011   incisional   INCISIONAL HERNIA REPAIR  01/20/2011   Procedure: LAPAROSCOPIC INCISIONAL HERNIA;  Surgeon: Megan Keens, DO;  Location: [facility];  Service: General;  Laterality: N/A;  Laparoscopic repair of a recurrent incisional hernia with Mesh   INGUINAL HERNIA REPAIR     x's 3   VENTRAL HERNIA REPAIR     Social History:  reports that she quit smoking about 24 years ago. Her smoking use included cigarettes. She has a 20.00 pack-year smoking history. She has never used smokeless tobacco. She reports previous alcohol use of about 4.0 standard drinks of alcohol per week. She reports that she does not use drugs. Family History:  Family History  Problem Relation Age of Onset   Breast cancer Mother    Diabetes Mother    Dementia Mother    Liver cancer Mother    Heart disease Father        heart attack   Diabetes Brother    Breast cancer Sister    Colon cancer Neg Hx    Stomach cancer Neg Hx  HOME MEDICATIONS: Allergies as of 06/29/2020       Reactions   Aspirin Nausea Only        Medication List        Accurate as of June 29, 2020  2:16 PM. If you have any questions, ask your nurse or doctor.          amLODipine 10 MG tablet Commonly known as: NORVASC Take 1 tablet (10 mg total) by mouth daily.   b complex vitamins tablet Take 1 tablet by mouth daily.   FISH OIL + D3 PO Take 1,200 mg by mouth daily.   fluticasone 50 MCG/ACT nasal spray Commonly known as: FLONASE Place 2 sprays into both nostrils daily.   glimepiride 2 MG tablet Commonly known as: AMARYL Take 1 tablet (2 mg total) by mouth daily before breakfast.   Janumet 50-1000 MG tablet Generic drug: sitaGLIPtin-metformin TAKE 1  TABLET BY MOUTH TWICE DAILY WITH A MEAL   loratadine 10 MG tablet Commonly known as: CLARITIN Take 1 tablet (10 mg total) by mouth daily.   losartan 100 MG tablet Commonly known as: COZAAR TK 1 T PO D   montelukast 10 MG tablet Commonly known as: SINGULAIR Take 1 tablet (10 mg total) by mouth at bedtime.   multivitamin tablet Take 1 tablet by mouth daily. OVER 50 VITAMIN   traZODone 50 MG tablet Commonly known as: DESYREL Take 0.5-1 tablets (25-50 mg total) by mouth at bedtime as needed for sleep.   Vitamin D3 250 MCG (10000 UT) Tabs Take by mouth daily.         OBJECTIVE:   Vital Signs: BP 140/70   Pulse 87   Ht 5\' 5"  (1.651 m)   Wt 177 lb (80.3 kg)   SpO2 100%   BMI 29.45 kg/m   Wt Readings from Last 3 Encounters:  06/29/20 177 lb (80.3 kg)  06/17/20 176 lb 9.6 oz (80.1 kg)  06/01/20 177 lb 2 oz (80.3 kg)     Exam: General: Megan [name] appears well and [name] in NAD  Neck: General: Supple without adenopathy. Thyroid: Thyroid size normal.  No goiter or nodules appreciated. No thyroid bruit.  Lungs: Clear with good BS bilat with no rales, rhonchi, or wheezes  Heart: RRR   Abdomen: Normoactive bowel sounds, soft, nontender, without masses or organomegaly palpable  Extremities: No pretibial edema. No tremor. Normal strength and motion throughout. See detailed diabetic foot exam below.  Neuro: MS [name] good with appropriate affect, Megan [name] [name] alert and Ox3    DM foot exam  02/17/2020 The skin of the feet [name] intact without sores or ulcerations. The pedal pulses are 2+ on right and 2+ on left. The sensation [name] intact to a screening 5.07, 10 gram monofilament bilaterally     DATA REVIEWED:  Lab Results  Component Value Date   HGBA1C 6.6 (H) 06/17/2020   HGBA1C 5.9 (A) 02/17/2020   HGBA1C 5.8 (A) 11/11/2019   Lab Results  Component Value Date   MICROALBUR 0.8 07/14/2019   LDLCALC 74 06/17/2020   CREATININE 0.77 06/17/2020   Lab Results  Component Value Date    MICRALBCREAT 2.0 07/14/2019     Lab Results  Component Value Date   CHOL 155 06/17/2020   HDL 55.30 06/17/2020   LDLCALC 74 06/17/2020   LDLDIRECT 88.0 04/21/2014   TRIG 129.0 06/17/2020   CHOLHDL 3 06/17/2020       Results for Megan, [name] (MRN [id number]) as of 02/18/2020 13:46  Ref.  Range 02/17/2020 09:35  TSH Latest Ref Range: 0.35 - 4.50 uIU/mL 0.56  T4,Free(Direct) Latest Ref Range: 0.60 - 1.60 ng/dL 0.96    ASSESSMENT / PLAN / RECOMMENDATIONS:   1) Type 2 Diabetes Mellitus, Optimally controlled, Without complications - Most recent A1c of 6.6 %. Goal A1c < 7.0 %.    - A1c [name] optimal  without hypoglycemia.  - No changes today      MEDICATIONS: - Continue Glimepiride 2 mg, one  tablet before Breakfast  - Continue Janumet 50-1000 mg twice daily   EDUCATION / INSTRUCTIONS: BG monitoring instructions: Patient [name] instructed to check her blood sugars 1 times a day, fasting . Call [facility] if: BG persistently < 70 I reviewed the Rule of 15 for the treatment of hypoglycemia in detail with the patient. Literature supplied.   2) Diabetic complications:  Eye: Does not have known diabetic retinopathy.  Neuro/ Feet: Does not have known diabetic peripheral neuropathy .  Renal: Patient does not have known baseline CKD. She   [name] on an ACEI/ARB at present.       F/U in 4 months    Signed electronically by: Mack Guise, MD  [facility]  [facility] [address] [place], [place] [address] Phone: [phone] FAX: [phone]   CC: [address] [place] [address] Phone: [phone]  Fax: [phone]  Return to Endocrinology clinic as below: Future Appointments  Date Time Provider [place]  06/29/2020  2:20 PM Justice Aguirre, Melanie Crazier, MD [facility]  12/21/2020  8:40 AM Megan Held, DO [facility]

## 2020-06-29 NOTE — Patient Instructions (Signed)
-   Keep up the Good work ! - Continue Glimepiride to 2 mg, one  tablet before Breakfast  - Continue Janumet 50-1000 mg twice daily      HOW TO TREAT LOW BLOOD SUGARS (Blood sugar LESS THAN 70 MG/DL) Please follow the RULE OF 15 for the treatment of hypoglycemia treatment (when your (blood sugars are less than 70 mg/dL)   STEP 1: Take 15 grams of carbohydrates when your blood sugar is low, which includes:  3-4 GLUCOSE TABS  OR 3-4 OZ OF JUICE OR REGULAR SODA OR ONE TUBE OF GLUCOSE GEL    STEP 2: RECHECK blood sugar in 15 MINUTES STEP 3: If your blood sugar is still low at the 15 minute recheck --> then, go back to STEP 1 and treat AGAIN with another 15 grams of carbohydrates.

## 2020-07-14 DIAGNOSIS — L728 Other follicular cysts of the skin and subcutaneous tissue: Secondary | ICD-10-CM | POA: Diagnosis not present

## 2020-07-15 LAB — HM DIABETES EYE EXAM

## 2020-07-26 DIAGNOSIS — R599 Enlarged lymph nodes, unspecified: Secondary | ICD-10-CM | POA: Diagnosis not present

## 2020-08-03 DIAGNOSIS — Z20822 Contact with and (suspected) exposure to covid-19: Secondary | ICD-10-CM | POA: Diagnosis not present

## 2020-08-28 DIAGNOSIS — Z20822 Contact with and (suspected) exposure to covid-19: Secondary | ICD-10-CM | POA: Diagnosis not present

## 2020-09-03 ENCOUNTER — Telehealth: Payer: Self-pay | Admitting: Family Medicine

## 2020-09-03 NOTE — Telephone Encounter (Signed)
Left message for patient to call back and schedule Medicare Annual Wellness Visit (AWV) in office.   If not able to come in office, please offer to do virtually or by telephone.  Left office number and my jabber #[phone].  Due for AWVI  Please schedule at anytime with Nurse Health Advisor.   

## 2020-09-06 ENCOUNTER — Telehealth: Payer: Self-pay | Admitting: Family Medicine

## 2020-09-07 ENCOUNTER — Ambulatory Visit (INDEPENDENT_AMBULATORY_CARE_PROVIDER_SITE_OTHER): Payer: Medicare Other

## 2020-09-07 VITALS — Ht 65.0 in | Wt 174.0 lb

## 2020-09-07 DIAGNOSIS — Z0000 Encounter for general adult medical examination without abnormal findings: Secondary | ICD-10-CM | POA: Diagnosis not present

## 2020-09-07 NOTE — Progress Notes (Signed)
Subjective:   Megan [name] is a 65 y.o. female who presents for an Initial Medicare Annual Wellness Visit.  I connected with Shantiqua today by telephone and verified that I am speaking with the correct person using two identifiers. Location patient: home Location provider: work Persons participating in the virtual visit: patient, Marine scientist.    I discussed the limitations, risks, security and privacy concerns of performing an evaluation and management service by telephone and the availability of in person appointments. I also discussed with the patient that there may be a patient responsible charge related to this service. The patient expressed understanding and verbally consented to this telephonic visit.    Interactive audio and video telecommunications were attempted between this provider and patient, however failed, due to patient having technical difficulties OR patient did not have access to video capability.  We continued and completed visit with audio only.  Some vital signs may be absent or patient reported.   Time Spent with patient on telephone encounter: 25 minutes   Review of Systems     Cardiac Risk Factors include: advanced age (>37mn, >>58women);diabetes mellitus;dyslipidemia;hypertension     Objective:    Today's Vitals   09/07/20 1420  Weight: 174 lb (78.9 kg)  Height: '5\' 5"'$  (1.651 m)   Body mass index is 28.96 kg/m.  Advanced Directives 09/07/2020 09/15/2019 10/11/2016 01/20/2011 01/16/2011  Does Patient Have a Medical Advance Directive? Yes Yes Yes Patient has advance directive, copy not in chart Patient has advance directive, copy not in chart  Type of Advance Directive [place] will Living will Living will - -  Copy of [place] Chart? No - copy requested - - Copy requested from family Copy requested from family  Pre-existing out of facility DNR order (yellow form or pink MOST form) - - - No -    Current  Medications (verified) Outpatient Encounter Medications as of 09/07/2020  Medication Sig   amLODipine (NORVASC) 10 MG tablet Take 1 tablet (10 mg total) by mouth daily.   b complex vitamins tablet Take 1 tablet by mouth daily.   Cholecalciferol (VITAMIN D3) 250 MCG (10000 UT) TABS Take by mouth daily.   Fish Oil-Cholecalciferol (FISH OIL + D3 PO) Take 1,200 mg by mouth daily.    fluticasone (FLONASE) 50 MCG/ACT nasal spray Place 2 sprays into both nostrils daily.   glimepiride (AMARYL) 2 MG tablet Take 1 tablet (2 mg total) by mouth daily before breakfast.   loratadine (CLARITIN) 10 MG tablet Take 1 tablet (10 mg total) by mouth daily.   losartan (COZAAR) 100 MG tablet TK 1 T PO D   montelukast (SINGULAIR) 10 MG tablet Take 1 tablet (10 mg total) by mouth at bedtime.   Multiple Vitamin (MULTIVITAMIN) tablet Take 1 tablet by mouth daily. OVER 50 VITAMIN   sitaGLIPtin-metformin (JANUMET) 50-1000 MG tablet TAKE 1 TABLET BY MOUTH TWICE DAILY WITH A MEAL   traZODone (DESYREL) 50 MG tablet Take 0.5-1 tablets (25-50 mg total) by mouth at bedtime as needed for sleep.   No facility-administered encounter medications on file as of 09/07/2020.    Allergies (verified) Aspirin   History: Past Medical History:  Diagnosis Date   Allergy    Arthritis    fingers   Colon polyp    Diabetes mellitus    Diverticulosis    GERD (gastroesophageal reflux disease)    occasionally   H/O hiatal hernia    ?   Hemorrhoids  Hyperlipidemia    takes fish oil   Hypertension    Infectious colitis    Ventral hernia    Past Surgical History:  Procedure Laterality Date   colonscopy     DIAGNOSTIC LAPAROSCOPY     FOOT SURGERY Bilateral    hammer toes   HEMORRHOID SURGERY     HERNIA REPAIR  01/20/2011   incisional   INCISIONAL HERNIA REPAIR  01/20/2011   Procedure: LAPAROSCOPIC INCISIONAL HERNIA;  Surgeon: Judieth Keens, DO;  Location: [facility];  Service: General;  Laterality: N/A;  Laparoscopic repair  of a recurrent incisional hernia with Mesh   INGUINAL HERNIA REPAIR     x's 3   VENTRAL HERNIA REPAIR     Family History  Problem Relation Age of Onset   Breast cancer Mother    Diabetes Mother    Dementia Mother    Liver cancer Mother    Heart disease Father        heart attack   Diabetes Brother    Breast cancer Sister    Colon cancer Neg Hx    Stomach cancer Neg Hx    Social History   Socioeconomic History   Marital status: Divorced    Spouse name: Not on file   Number of children: 1   Years of education: Not on file   Highest education level: Not on file  Occupational History   Occupation: assessment specialist-retired    Comment: city of [place]  Tobacco Use   Smoking status: Former    Packs/day: 1.00    Years: 20.00    Pack years: 20.00    Types: Cigarettes    Quit date: 01/05/1996    Years since quitting: 24.6   Smokeless tobacco: Never  Vaping Use   Vaping Use: Never used  Substance and Sexual Activity   Alcohol use: Yes    Alcohol/week: 4.0 standard drinks    Types: 4 Glasses of wine per week    Comment: occasional wine   Drug use: No   Sexual activity: Not on file  Other Topics Concern   Not on file  Social History Narrative   Exercise 3 times a week   Social Determinants of Health   Financial Resource Strain: Low Risk    Difficulty of Paying Living Expenses: Not hard at all  Food Insecurity: No Food Insecurity   Worried About Charity fundraiser in the Last Year: Never true   Ran Out of Food in the Last Year: Never true  Transportation Needs: No Transportation Needs   Lack of Transportation (Medical): No   Lack of Transportation (Non-Medical): No  Physical Activity: Sufficiently Active   Days of Exercise per Week: 7 days   Minutes of Exercise per Session: 60 min  Stress: No Stress Concern Present   Feeling of Stress : Only a little  Social Connections: Moderately Isolated   Frequency of Communication with Friends and Family: More than  three times a week   Frequency of Social Gatherings with Friends and Family: More than three times a week   Attends Religious Services: More than 4 times per year   Active Member of [employer] or Organizations: No   Attends Archivist Meetings: Never   Marital Status: Divorced    Tobacco Counseling Counseling given: Not Answered   Clinical Intake:  Pre-visit preparation completed: Yes  Pain : No/denies pain     Nutritional Status: BMI 25 -29 Overweight Nutritional Risks: None Diabetes: Yes CBG done?: No  Did pt. bring in CBG monitor from home?: No (phone visit)  How often do you need to have someone help you when you read instructions, pamphlets, or other written materials from your doctor or pharmacy?: 1 - Never  Diabetes:  Is the patient diabetic?  Yes  If diabetic, was a CBG obtained today?  No  Did the patient bring in their glucometer from home?  No phone visit How often do you monitor your CBG's? daily.   Financial Strains and Diabetes Management:  Are you having any financial strains with the device, your supplies or your medication? No .  Does the patient want to be seen by Chronic Care Management for management of their diabetes?  No  Would the patient like to be referred to a Nutritionist or for Diabetic Management?  No   Diabetic Exams:  Diabetic Eye Exam: Completed 07/15/2020.   Diabetic Foot Exam: Pt has been advised about the importance in completing this exam. To be completed by PCP.    Interpreter Needed?: No  Information entered by :: Caroleen Hamman LPN   Activities of Daily Living In your present state of health, do you have any difficulty performing the following activities: 09/07/2020 06/01/2020  Hearing? N N  Vision? N N  Difficulty concentrating or making decisions? N N  Walking or climbing stairs? N N  Dressing or bathing? N N  Doing errands, shopping? N N  Preparing Food and eating ? N -  Using the Toilet? N -  In the past six  months, have you accidently leaked urine? N -  Do you have problems with loss of bowel control? N -  Managing your Medications? N -  Managing your Finances? N -  Housekeeping or managing your Housekeeping? N -  Some recent data might be hidden    Patient Care Team: Carollee Herter, Alferd Apa, DO as PCP - General Molli Posey, MD as Consulting Physician (Obstetrics and Gynecology)  Indicate any recent Medical Services you may have received from other than [facility] providers in the past year (date may be approximate).     Assessment:   This is a routine wellness examination for Saylee.  Hearing/Vision screen Hearing Screening - Comments:: No issues Vision Screening - Comments:: Last eye exam-07/15/2020-[facility]  Dietary issues and exercise activities discussed: Current Exercise Habits: Home exercise routine, Type of exercise: walking, Time (Minutes): 60, Frequency (Times/Week): 7, Weekly Exercise (Minutes/Week): 420, Intensity: Mild, Exercise limited by: None identified   Goals Addressed             This Visit's Progress    Patient Stated       Continue losing weight & walking       Depression Screen PHQ 2/9 Scores 09/07/2020 06/01/2020 12/26/2019 11/05/2018 04/16/2017 11/08/2015 12/07/2011  PHQ - 2 Score 1 0 0 1 0 0 0  PHQ- 9 Score - - - 5 - - -    Fall Risk Fall Risk  09/07/2020 06/01/2020 09/15/2019 04/16/2017 02/05/2017  Falls in the past year? '1 1 1 '$ No No  Number falls in past yr: 0 0 0 - -  Injury with Fall? 1 0 1 - -  Comment back pain - - - -  Risk for fall due to : History of fall(s) - - - -  Follow up Falls prevention discussed Falls evaluation completed - - -    FALL RISK PREVENTION PERTAINING TO THE HOME:  Any stairs in or around the home? Yes  If so, are  there any without handrails? No  Home free of loose throw rugs in walkways, pet beds, electrical cords, etc? Yes  Adequate lighting in your home to reduce risk of falls? Yes   ASSISTIVE DEVICES UTILIZED TO  PREVENT FALLS:  Life alert? No  Use of a cane, walker or w/c? No  Grab bars in the bathroom? Yes  Shower chair or bench in shower? No  Elevated toilet seat or a handicapped toilet? No   TIMED UP AND GO:  Was the test performed? No . Phone visit   Cognitive Function:Normal cognitive status assessed by this Nurse Health Advisor. No abnormalities found.          Immunizations Immunization History  Administered Date(s) Administered   Influenza Whole 11/06/2011   PFIZER(Purple Top)SARS-COV-2 Vaccination 03/27/2019, 04/16/2019, 11/16/2019   Pneumococcal Conjugate-13 01/03/2019   Pneumococcal Polysaccharide-23 07/01/2013   Tdap 05/03/2012   Zoster, Live 07/01/2013    TDAP status: Up to date  Flu Vaccine status: Up to date  Pneumococcal vaccine status: Up to date  Covid-19 vaccine status: Information provided on how to obtain vaccines. Booster due  Qualifies for Shingles Vaccine? Yes   Zostavax completed Yes   Shingrix Completed?: No.    Education has been provided regarding the importance of this vaccine. Patient has been advised to call insurance company to determine out of pocket expense if they have not yet received this vaccine. Advised may also receive vaccine at local pharmacy or Health Dept. Verbalized acceptance and understanding.  Screening Tests Health Maintenance  Topic Date Due   Zoster Vaccines- Shingrix (1 of 2) Never done   MAMMOGRAM  02/21/2019   FOOT EXAM  11/05/2019   PNA vac Low Risk Adult (2 of 2 - PPSV23) 01/03/2020   COVID-19 Vaccine (4 - Booster for Pfizer series) 03/15/2020   INFLUENZA VACCINE  08/16/2020   HEMOGLOBIN A1C  12/17/2020   OPHTHALMOLOGY EXAM  07/15/2021   TETANUS/TDAP  05/04/2022   COLONOSCOPY (Pts 45-18yr Insurance coverage will need to be confirmed)  10/26/2026   DEXA SCAN  Completed   Hepatitis C Screening  Completed   HPV VACCINES  Aged Out    Health Maintenance  Health Maintenance Due  Topic Date Due   Zoster  Vaccines- Shingrix (1 of 2) Never done   MAMMOGRAM  02/21/2019   FOOT EXAM  11/05/2019   PNA vac Low Risk Adult (2 of 2 - PPSV23) 01/03/2020   COVID-19 Vaccine (4 - Booster for Pfizer series) 03/15/2020   INFLUENZA VACCINE  08/16/2020    Colorectal cancer screening: Type of screening: Colonoscopy. Completed 10/25/2016. Repeat every 10 years  Mammogram status: Completed Bilateral 06/21/2020. Repeat every year  Bone Density status: Due-Patient states she will have GYN order.  Lung Cancer Screening: (Low Dose CT Chest recommended if Age 67-80years, 30 pack-year currently smoking OR have quit w/in 15years.) does not qualify.     Additional Screening:  Hepatitis C Screening: Completed 11/08/2015  Vision Screening: Recommended annual ophthalmology exams for early detection of glaucoma and other disorders of the eye. Is the patient up to date with their annual eye exam?  Yes  Who is the provider or what is the name of the office in which the patient attends annual eye exams? [facility]   Dental Screening: Recommended annual dental exams for proper oral hResearch scientist (life sciences)Referral / Chronic Care Management: CRR required this visit?  No   CCM required this visit?  No      Plan:  I have personally reviewed and noted the following in the patient's chart:   Medical and social history Use of alcohol, tobacco or illicit drugs  Current medications and supplements including opioid prescriptions. Patient is not currently taking opioid prescriptions. Functional ability and status Nutritional status Physical activity Advanced directives List of other physicians Hospitalizations, surgeries, and ER visits in previous 12 months Vitals Screenings to include cognitive, depression, and falls Referrals and appointments  In addition, I have reviewed and discussed with patient certain preventive protocols, quality metrics, and best practice recommendations. A written  personalized care plan for preventive services as well as general preventive health recommendations were provided to patient.   Due to this being a telephonic visit, the after visit summary with patients personalized plan was offered to patient via mail or my-chart. Patient would like to access on my-chart.   Marta Antu, LPN   [id number]  Nurse Health Advisor  Nurse Notes: None

## 2020-09-07 NOTE — Patient Instructions (Signed)
Megan [name] , Thank you for taking time to complete your Medicare Wellness Visit. I appreciate your ongoing commitment to your health goals. Please review the following plan we discussed and let me know if I can assist you in the future.   Screening recommendations/referrals: Colonoscopy: Completed 10/25/2016-Due 10/26/2026 Mammogram: Completed 06/21/2020 at GYN office-Please have copy of report sent to PCP. Bone Density: Due-Per our conversation, you will have GYN order. Recommended yearly ophthalmology/optometry visit for glaucoma screening and checkup Recommended yearly dental visit for hygiene and checkup  Vaccinations: Influenza vaccine: Due 09/2020 Pneumococcal vaccine: Up to date Tdap vaccine: Up to date-Due 05/04/2022 Shingles vaccine: Discuss with pharmacy   Covid-19: Booster due  Advanced directives: Please bring a copy for your chart  Conditions/risks identified: See problem list  Next appointment: Follow up in one year for your annual wellness visit 09/12/2021 @ 1:40   Preventive Care 67 Years and Older, Female Preventive care refers to lifestyle choices and visits with your health care provider that can promote health and wellness. What does preventive care include? A yearly physical exam. This is also called an annual well check. Dental exams once or twice a year. Routine eye exams. Ask your health care provider how often you should have your eyes checked. Personal lifestyle choices, including: Daily care of your teeth and gums. Regular physical activity. Eating a healthy diet. Avoiding tobacco and drug use. Limiting alcohol use. Practicing safe sex. Taking low-dose aspirin every day. Taking vitamin and mineral supplements as recommended by your health care provider. What happens during an annual well check? The services and screenings done by your health care provider during your annual well check will depend on your age, overall health, lifestyle risk factors, and  family history of disease. Counseling  Your health care provider may ask you questions about your: Alcohol use. Tobacco use. Drug use. Emotional well-being. Home and relationship well-being. Sexual activity. Eating habits. History of falls. Memory and ability to understand (cognition). Work and work Statistician. Reproductive health. Screening  You may have the following tests or measurements: Height, weight, and BMI. Blood pressure. Lipid and cholesterol levels. These may be checked every 5 years, or more frequently if you are over 12 years old. Skin check. Lung cancer screening. You may have this screening every year starting at age 67 if you have a 30-pack-year history of smoking and currently smoke or have quit within the past 15 years. Fecal occult blood test (FOBT) of the stool. You may have this test every year starting at age 67. Flexible sigmoidoscopy or colonoscopy. You may have a sigmoidoscopy every 5 years or a colonoscopy every 10 years starting at age 67. Hepatitis C blood test. Hepatitis B blood test. Sexually transmitted disease (STD) testing. Diabetes screening. This is done by checking your blood sugar (glucose) after you have not eaten for a while (fasting). You may have this done every 1-3 years. Bone density scan. This is done to screen for osteoporosis. You may have this done starting at age 67. Mammogram. This may be done every 1-2 years. Talk to your health care provider about how often you should have regular mammograms. Talk with your health care provider about your test results, treatment options, and if necessary, the need for more tests. Vaccines  Your health care provider may recommend certain vaccines, such as: Influenza vaccine. This is recommended every year. Tetanus, diphtheria, and acellular pertussis (Tdap, Td) vaccine. You may need a Td booster every 10 years. Zoster vaccine. You may need  this after age 67. Pneumococcal 13-valent conjugate  (PCV13) vaccine. One dose is recommended after age 67. Pneumococcal polysaccharide (PPSV23) vaccine. One dose is recommended after age 67. Talk to your health care provider about which screenings and vaccines you need and how often you need them. This information is not intended to replace advice given to you by your health care provider. Make sure you discuss any questions you have with your health care provider. Document Released: 01/29/2015 Document Revised: 09/22/2015 Document Reviewed: 11/03/2014 Elsevier Interactive Patient Education  2017 [place] Prevention in the Home Falls can cause injuries. They can happen to people of all ages. There are many things you can do to make your home safe and to help prevent falls. What can I do on the outside of my home? Regularly fix the edges of walkways and driveways and fix any cracks. Remove anything that might make you trip as you walk through a door, such as a raised step or threshold. Trim any bushes or trees on the path to your home. Use bright outdoor lighting. Clear any walking paths of anything that might make someone trip, such as rocks or tools. Regularly check to see if handrails are loose or broken. Make sure that both sides of any steps have handrails. Any raised decks and porches should have guardrails on the edges. Have any leaves, snow, or ice cleared regularly. Use sand or salt on walking paths during winter. Clean up any spills in your garage right away. This includes oil or grease spills. What can I do in the bathroom? Use night lights. Install grab bars by the toilet and in the tub and shower. Do not use towel bars as grab bars. Use non-skid mats or decals in the tub or shower. If you need to sit down in the shower, use a plastic, non-slip stool. Keep the floor dry. Clean up any water that spills on the floor as soon as it happens. Remove soap buildup in the tub or shower regularly. Attach bath mats securely with  double-sided non-slip rug tape. Do not have throw rugs and other things on the floor that can make you trip. What can I do in the bedroom? Use night lights. Make sure that you have a light by your bed that is easy to reach. Do not use any sheets or blankets that are too big for your bed. They should not hang down onto the floor. Have a firm chair that has side arms. You can use this for support while you get dressed. Do not have throw rugs and other things on the floor that can make you trip. What can I do in the kitchen? Clean up any spills right away. Avoid walking on wet floors. Keep items that you use a lot in easy-to-reach places. If you need to reach something above you, use a strong step stool that has a grab bar. Keep electrical cords out of the way. Do not use floor polish or wax that makes floors slippery. If you must use wax, use non-skid floor wax. Do not have throw rugs and other things on the floor that can make you trip. What can I do with my stairs? Do not leave any items on the stairs. Make sure that there are handrails on both sides of the stairs and use them. Fix handrails that are broken or loose. Make sure that handrails are as long as the stairways. Check any carpeting to make sure that it is firmly attached to  the stairs. Fix any carpet that is loose or worn. Avoid having throw rugs at the top or bottom of the stairs. If you do have throw rugs, attach them to the floor with carpet tape. Make sure that you have a light switch at the top of the stairs and the bottom of the stairs. If you do not have them, ask someone to add them for you. What else can I do to help prevent falls? Wear shoes that: Do not have high heels. Have rubber bottoms. Are comfortable and fit you well. Are closed at the toe. Do not wear sandals. If you use a stepladder: Make sure that it is fully opened. Do not climb a closed stepladder. Make sure that both sides of the stepladder are locked  into place. Ask someone to hold it for you, if possible. Clearly mark and make sure that you can see: Any grab bars or handrails. First and last steps. Where the edge of each step is. Use tools that help you move around (mobility aids) if they are needed. These include: Canes. Walkers. Scooters. Crutches. Turn on the lights when you go into a dark area. Replace any light bulbs as soon as they burn out. Set up your furniture so you have a clear path. Avoid moving your furniture around. If any of your floors are uneven, fix them. If there are any pets around you, be aware of where they are. Review your medicines with your doctor. Some medicines can make you feel dizzy. This can increase your chance of falling. Ask your doctor what other things that you can do to help prevent falls. This information is not intended to replace advice given to you by your health care provider. Make sure you discuss any questions you have with your health care provider. Document Released: 10/29/2008 Document Revised: 06/10/2015 Document Reviewed: 02/06/2014 Elsevier Interactive Patient Education  2017 [employer].

## 2020-09-08 ENCOUNTER — Ambulatory Visit: Payer: Medicare Other | Admitting: Pharmacist

## 2020-09-08 DIAGNOSIS — I10 Essential (primary) hypertension: Secondary | ICD-10-CM

## 2020-09-08 DIAGNOSIS — E1165 Type 2 diabetes mellitus with hyperglycemia: Secondary | ICD-10-CM

## 2020-09-09 ENCOUNTER — Telehealth: Payer: Self-pay | Admitting: Pharmacist

## 2020-09-09 ENCOUNTER — Other Ambulatory Visit: Payer: Self-pay | Admitting: Family Medicine

## 2020-09-09 DIAGNOSIS — E785 Hyperlipidemia, unspecified: Secondary | ICD-10-CM

## 2020-09-09 MED ORDER — ROSUVASTATIN CALCIUM 5 MG PO TABS: 5.0000 mg | ORAL_TABLET | Freq: Every day | ORAL | 3 refills | Status: DC

## 2020-09-09 NOTE — Patient Instructions (Signed)
Visit Information  PATIENT GOALS:  Goals Addressed             This Visit's Progress    Care Coordination Pharmacy Plan        Pharmacist Clinical Goal(s):  Over the next 90 days, patient will try statin through collaboration with PharmD and provider.   Interventions: 1:1 collaboration with Carollee Herter, Alferd Apa, DO regarding development and update of comprehensive plan of care as evidenced by provider attestation and co-signature Inter-disciplinary care team collaboration (see longitudinal plan of care) Comprehensive medication review performed; medication list updated in electronic medical record  Diabetes: Lab Results  Component Value Date   HGBA1C 6.6 (H) 06/17/2020  Goal: A1c < 7 Current treatment: Janumet 50/'1000mg'$  twice a day with a meal Glimepiride '2mg'$  daily with breakfast.  Interventions:  Discussed benefits statin / rosuvastatin to lower heart disease risk Coordinated with Dr Etter Sjogren to discussed statin therapy - recommended start rosuvastatin '5mg'$  daily Discussed potential side effect and reassured patient that they were rare but she should report if she has any changes in muscle aches or pains.  Hypertension: Controlled; Goal BP <140/90 Current treatment: Amlodipine '10mg'$  daily  Losartan '100mg'$  daily   Interventions:  Discussed blood pressure goal Continue current medications for blood pressure  Patient Goals/Self-Care Activities Over the next 90 days, patient will:  take medications as prescribed and  start rosuvastatin '5mg'$  daily. Contact clinical pharmacist or Dr Nonda Lou office if any problems.   Follow Up Plan: No further follow up required: will follow up with provider

## 2020-09-09 NOTE — Telephone Encounter (Signed)
Okay to send in 

## 2020-09-09 NOTE — Telephone Encounter (Signed)
Patient called and discussed benefits of statin for patient's with diabetes. She has agreed to trial for 30 days of rosuvastatin '5mg'$  daily. Will forward message to Dr Nonda Lou team to send Rx to her pharmacy.    RE: statin Received: Today Carollee Herter, Alferd Apa, DO  Cherre Robins, PharmD  I'm good with that        Previous Messages   ----- Message -----  From: Cherre Robins, PharmD  Sent: 09/09/2020   2:11 PM EDT  To: [name], DO  Subject: statin                                         Mrs. Handke called the office yesterday upset because her [facility] pharmacist called to recommend that she take a statin. It was likely triggered by insurance / statin in diabetic gap.   I reviewed her chart and looks like she has refused statins in the past due to concerns with side effects. When I spoke to her yesterday though she said she would take a statin if her provider recommended.   She does have controlled DM. A1c was 6.6% 06/17/2020 - on Janumet  Last LDL was 74 on 06/17/2020.  10 year ASCVD risk is estimated to be 16.1%.   We could try low dose rosuvastatin '5mg'$  daily if you think it is appropriate.   Philander Ake

## 2020-09-09 NOTE — Chronic Care Management (AMB) (Signed)
Care Management   Pharmacy Note  09/09/2020 Name: Megan [name] MRN: [id number] DOB: [birth year]  Subjective: Megan [name] is a 65 y.o. year old female who is a primary care patient of [name], DO. The Care Management team was consulted for assistance with care management and care coordination needs.    Engaged with patient by telephone for initial visit in response to provider referral for pharmacy case management and/or care coordination services.   The patient was given information about Care Management services today including:  Care Management services includes personalized support from designated clinical staff supervised by the patient's primary care provider, including individualized plan of care and coordination with other care providers. 24/7 contact phone numbers for assistance for urgent and routine care needs. The patient may stop case management services at any time by phone call to the office staff.  Patient agreed to services and consent obtained.  Assessment:  Review of patient status, including review of consultants reports, laboratory and other test data, was performed as part of comprehensive evaluation and provision of chronic care management services.   SDOH (Social Determinants of Health) assessments and interventions performed:    Objective:  Lab Results  Component Value Date   CREATININE 0.77 06/17/2020   CREATININE 0.79 01/26/2020   CREATININE 0.79 07/14/2019    Lab Results  Component Value Date   HGBA1C 6.6 (H) 06/17/2020       Component Value Date/Time   CHOL 155 06/17/2020 0939   TRIG 129.0 06/17/2020 0939   HDL 55.30 06/17/2020 0939   CHOLHDL 3 06/17/2020 0939   VLDL 25.8 06/17/2020 0939   LDLCALC 74 06/17/2020 0939   LDLDIRECT 88.0 04/21/2014 1103    Other: (TSH, CBC, Vit D, etc.)  Clinical ASCVD: No  The 10-year ASCVD risk score [name] DC Jr., et al., 2013) is: 16.7%   Values used to calculate the score:      Age: 22 years     Sex: Female     Is Non-Hispanic African American: Yes     Diabetic: Yes     Tobacco smoker: No     Systolic Blood Pressure: 123XX123 mmHg     Is BP treated: Yes     HDL Cholesterol: 55.3 mg/dL     Total Cholesterol: 155 mg/dL    Other: (CHADS2VASc if Afib, PHQ9 if depression, MMRC or CAT for COPD, ACT, DEXA)  BP Readings from Last 3 Encounters:  06/29/20 140/70  06/17/20 118/68  06/09/20 140/68    Care Plan  Allergies  Allergen Reactions   Aspirin Nausea Only    Medications Reviewed Today     Reviewed by Cherre Robins, PharmD (Pharmacist) on 09/09/20 at 0754  Med List Status: <None>   Medication Order Taking? Sig Documenting Provider Last Dose Status Informant  amLODipine (NORVASC) 10 MG tablet [id number] Yes Take 1 tablet (10 mg total) by mouth daily. [name], DO Taking Active   b complex vitamins tablet [id number] Yes Take 1 tablet by mouth daily. [provider] Taking Active   Cholecalciferol (VITAMIN D3) 250 MCG (10000 UT) TABS [id number] Yes Take by mouth daily. [provider] Taking Active   Fish Oil-Cholecalciferol (FISH OIL + D3 PO) [id number] Yes Take 1,200 mg by mouth daily.  [provider] Taking Active   fluticasone (FLONASE) 50 MCG/ACT nasal spray [id number] Yes Place 2 sprays into both nostrils daily. Roma Schanz R, DO Taking Active   glimepiride (AMARYL) 2 MG tablet [id number]  Yes Take 1 tablet (2 mg total) by mouth daily before breakfast. Shamleffer, Melanie Crazier, MD Taking Active   loratadine (CLARITIN) 10 MG tablet [id number] Yes Take 1 tablet (10 mg total) by mouth daily. Roma Schanz R, DO Taking Active   losartan (COZAAR) 100 MG tablet [id number] Yes TK 1 T PO D [provider] Taking Active   montelukast (SINGULAIR) 10 MG tablet [id number] Yes Take 1 tablet (10 mg total) by mouth at bedtime. [name], DO Taking Active   Multiple Vitamin (MULTIVITAMIN) tablet [id number] Yes  Take 1 tablet by mouth daily. OVER 50 VITAMIN [provider] Taking Active Self  sitaGLIPtin-metformin (JANUMET) 50-1000 MG tablet [id number] Yes TAKE 1 TABLET BY MOUTH TWICE DAILY WITH A MEAL Shamleffer, Melanie Crazier, MD Taking Active   traZODone (DESYREL) 50 MG tablet [id number] Yes Take 0.5-1 tablets (25-50 mg total) by mouth at bedtime as needed for sleep. [name], DO Taking Active             Patient Active Problem List   Diagnosis Date Noted   Generalized abdominal pain 02/19/2020   History of hernia repair 02/19/2020   Hair loss 02/17/2020   DM (diabetes mellitus) type II uncontrolled, periph vascular disorder ([place]) 02/17/2020   Diabetes mellitus without complication ([place]) [id number]   Type 2 diabetes mellitus with hyperglycemia, without long-term current use of insulin ([place]) 09/02/2019   Dyslipidemia 09/02/2019   Uncontrolled type 2 diabetes mellitus with hyperglycemia ([place]) 11/05/2018   Fatigue 11/05/2018   Acute non-recurrent pansinusitis 04/25/2018   Bronchitis 05/21/2017   CAP (community acquired pneumonia) 10/15/2013   Wheezing 09/29/2013   Obesity (BMI 30-39.9) 07/01/2013   Pain, knee 10/02/2012   Gastrocnemius muscle strain 10/02/2012   Wrist sprain 10/02/2012   Thyroid nodule 05/03/2012   Rash and nonspecific skin eruption 03/14/2012   HAIR LOSS 03/28/2010   COLONIC POLYPS, HX OF 11/09/2009   HEMANGIOMA, HEPATIC 11/04/2009   UNSPECIFIED ANEMIA 11/04/2009   DIVERTICULOSIS, COLON 11/04/2009   FATTY LIVER DISEASE 11/04/2009   PALPITATIONS 09/06/2009   Chronic fatigue 08/07/2008   Allergic rhinitis 04/10/2007   Diabetes mellitus type II, controlled ([place]) 12/21/2006   Hyperlipidemia 04/12/2006   Essential hypertension 04/12/2006    Conditions to be addressed/monitored: HTN and DMII  Care Plan : General Pharmacy (Adult)  Updates made by Cherre Robins, PHARMD since 09/09/2020 12:00 AM     Problem: type 2 DM; HTN; sleeplessness    Priority: Medium  Onset Date: 09/08/2020     Long-Range Goal: Management of medications and chronic conditions   Priority: Medium  Note:   Current Barriers:  Suboptimal therapeutic regimen for ASCVD risk reduction Patient has type 2 DM not currently taking statin  Pharmacist Clinical Goal(s):  Over the next 90 days, patient will  try statin  through collaboration with PharmD and provider.   Interventions: 1:1 collaboration with Carollee Herter, Alferd Apa, DO regarding development and update of comprehensive plan of care as evidenced by provider attestation and co-signature Inter-disciplinary care team collaboration (see longitudinal plan of care) Comprehensive medication review performed; medication list updated in electronic medical record  Diabetes: Controlled;  Followed by Dr Kelton Pillar, endo Lab Results  Component Value Date   HGBA1C 6.6 (H) 06/17/2020  current treatment: Janumet 50/'1000mg'$  twice a day with a meal Glimepiride '2mg'$  daily with breakfast Previous medications: repaglinide - stopped due to hypoglyemia Denies hypoglycemic/hyperglycemic symptoms Patient has changed diet and has lost about 11lbs over the last year.  Patient  was contacted by her pharmacy about starting a statin. She was upset because she felt that her physician's office should make this decision.  Simvastatin has been recommend in past but patient declined due to concerns for side effects.  Interventions:  Discussed benefits of lower ASCVD risk by taking statin Coordinated with Dr Etter Sjogren to discussed statin therapy - patient will start rosuvastatin '5mg'$  daily Discussed potential side effect and reassured patient that they were rare but she should report if she has any changes in muscle aches or pains.  Hypertension: Controlled; Goal BP <140/90 Current treatment: Amlodipine '10mg'$  daily  Losartan '100mg'$  daily   Denies hypotensive/hypertensive symptoms Interventions:  Discussed blood pressure  goal Continue current medications for blood pressure  Patient Goals/Self-Care Activities Over the next 90 days, patient will:  take medications as prescribed and start rosuvastatin '5mg'$  daily. Contact clinical pharmacist or Dr Nonda Lou office if any problems.   Follow Up Plan: No further follow up required: will follow up with provider         Medication Assistance:  None required.  Patient affirms current coverage meets needs.  Follow Up:  Patient requests no follow-up at this time.  Plan: No further follow up required: will follow up with PCP in December  Mal Asher [place], PharmD Clinical Pharmacist [facility]

## 2020-10-07 DIAGNOSIS — Z20822 Contact with and (suspected) exposure to covid-19: Secondary | ICD-10-CM | POA: Diagnosis not present

## 2020-10-29 ENCOUNTER — Other Ambulatory Visit: Payer: Self-pay

## 2020-10-29 ENCOUNTER — Ambulatory Visit (INDEPENDENT_AMBULATORY_CARE_PROVIDER_SITE_OTHER): Payer: Medicare Other | Admitting: Family Medicine

## 2020-10-29 ENCOUNTER — Encounter: Payer: Self-pay | Admitting: Family Medicine

## 2020-10-29 VITALS — BP 142/80 | HR 87 | Temp 98.2°F | Resp 18 | Ht 65.0 in | Wt 174.0 lb

## 2020-10-29 DIAGNOSIS — E1169 Type 2 diabetes mellitus with other specified complication: Secondary | ICD-10-CM

## 2020-10-29 DIAGNOSIS — M545 Low back pain, unspecified: Secondary | ICD-10-CM | POA: Diagnosis not present

## 2020-10-29 DIAGNOSIS — Z23 Encounter for immunization: Secondary | ICD-10-CM | POA: Diagnosis not present

## 2020-10-29 DIAGNOSIS — E785 Hyperlipidemia, unspecified: Secondary | ICD-10-CM | POA: Diagnosis not present

## 2020-10-29 DIAGNOSIS — E1165 Type 2 diabetes mellitus with hyperglycemia: Secondary | ICD-10-CM | POA: Diagnosis not present

## 2020-10-29 DIAGNOSIS — I10 Essential (primary) hypertension: Secondary | ICD-10-CM | POA: Diagnosis not present

## 2020-10-29 LAB — LIPID PANEL
Cholesterol: 171 mg/dL (ref 0–200)
HDL: 56.7 mg/dL (ref >39.00)
LDL Cholesterol: 82 mg/dL (ref 0–99)
NonHDL: 114.11
Total CHOL/HDL Ratio: 3
Triglycerides: 159 mg/dL — ABNORMAL HIGH (ref 0.0–149.0)
VLDL: 31.8 mg/dL (ref 0.0–40.0)

## 2020-10-29 LAB — COMPREHENSIVE METABOLIC PANEL
ALT: 18 U/L (ref 0–35)
AST: 18 U/L (ref 0–37)
Albumin: 4.3 g/dL (ref 3.5–5.2)
Alkaline Phosphatase: 59 U/L (ref 39–117)
BUN: 12 mg/dL (ref 6–23)
CO2: 29 mEq/L (ref 19–32)
Calcium: 9.7 mg/dL (ref 8.4–10.5)
Chloride: 102 mEq/L (ref 96–112)
Creatinine, Ser: 0.71 mg/dL (ref 0.40–1.20)
GFR: 87.86 mL/min (ref >60.00)
Glucose, Bld: 106 mg/dL — ABNORMAL HIGH (ref 70–99)
Potassium: 3.8 mEq/L (ref 3.5–5.1)
Sodium: 138 mEq/L (ref 135–145)
Total Bilirubin: 0.9 mg/dL (ref 0.2–1.2)
Total Protein: 7.7 g/dL (ref 6.0–8.3)

## 2020-10-29 LAB — CBC WITH DIFFERENTIAL/PLATELET
Basophils Absolute: 0.1 10*3/uL (ref 0.0–0.1)
Basophils Relative: 0.9 % (ref 0.0–3.0)
Eosinophils Absolute: 0.1 10*3/uL (ref 0.0–0.7)
Eosinophils Relative: 0.8 % (ref 0.0–5.0)
HCT: 38 % (ref 36.0–46.0)
Hemoglobin: 11.8 g/dL — ABNORMAL LOW (ref 12.0–15.0)
Lymphocytes Relative: 30.1 % (ref 12.0–46.0)
Lymphs Abs: 2.4 10*3/uL (ref 0.7–4.0)
MCHC: 31.1 g/dL (ref 30.0–36.0)
MCV: 68.1 fl — ABNORMAL LOW (ref 78.0–100.0)
Monocytes Absolute: 0.6 10*3/uL (ref 0.1–1.0)
Monocytes Relative: 8 % (ref 3.0–12.0)
Neutro Abs: 4.8 10*3/uL (ref 1.4–7.7)
Neutrophils Relative %: 60.2 % (ref 43.0–77.0)
Platelets: 376 10*3/uL (ref 150.0–400.0)
RBC: 5.58 Mil/uL — ABNORMAL HIGH (ref 3.87–5.11)
RDW: 15.9 % — ABNORMAL HIGH (ref 11.5–15.5)
WBC: 7.9 10*3/uL (ref 4.0–10.5)

## 2020-10-29 LAB — MICROALBUMIN / CREATININE URINE RATIO
Creatinine,U: 74.9 mg/dL
Microalb Creat Ratio: 5.4 mg/g (ref 0.0–30.0)
Microalb, Ur: 4.1 mg/dL — ABNORMAL HIGH (ref 0.0–1.9)

## 2020-10-29 LAB — HEMOGLOBIN A1C: Hgb A1c MFr Bld: 6.4 % (ref 4.6–6.5)

## 2020-10-29 MED ORDER — GABAPENTIN 100 MG PO CAPS: 100.0000 mg | ORAL_CAPSULE | Freq: Two times a day (BID) | ORAL | 3 refills | Status: DC

## 2020-10-29 MED ORDER — DIAZEPAM 5 MG PO TABS: 5.0000 mg | ORAL_TABLET | Freq: Two times a day (BID) | ORAL | 0 refills | Status: DC | PRN

## 2020-10-29 NOTE — Assessment & Plan Note (Signed)
hgba1c to be checked, minimize simple carbs. Increase exercise as tolerated. Continue current meds  

## 2020-10-29 NOTE — Assessment & Plan Note (Signed)
Encourage heart healthy diet such as MIND or DASH diet, increase exercise, avoid trans fats, simple carbohydrates and processed foods, consider a krill or fish or flaxseed oil cap daily.  °

## 2020-10-29 NOTE — Progress Notes (Signed)
Subjective:   By signing my name below, I, Megan [name], attest that this documentation has been prepared under the direction and in the presence of Megan Held, DO. 10/29/2020   Patient ID: Megan [name], female    DOB: 17-Apr-1955, 65 y.o.   MRN: [id number]  Chief Complaint  Patient presents with   Diabetes   Follow-up    HPI Patient is in today for an office visit on follow-up of diabetes mellitus.  She reports that her legs have been burning for the past 2 months and  is worse at night. The burning is located at the top of her legs and makes it hard to sleep at night. She has a history of bulging disks. She went to [facility] but could not complete the MRI because she is claustrophobic.  She checks her blood sugar levels at home and they have been good. They range from 100-127. She is interested in medication that will help her lose weight.  She takes walks everyday.  She plans on going back to work soon.  She has 3 Pfizer Covid-19 vaccines at this time. She is willing to get the flu vaccine today.  Past Medical History:  Diagnosis Date   Allergy    Arthritis    fingers   Colon polyp    Diabetes mellitus    Diverticulosis    GERD (gastroesophageal reflux disease)    occasionally   H/O hiatal hernia    ?   Hemorrhoids    Hyperlipidemia    takes fish oil   Hypertension    Infectious colitis    Ventral hernia     Past Surgical History:  Procedure Laterality Date   colonscopy     DIAGNOSTIC LAPAROSCOPY     FOOT SURGERY Bilateral    hammer toes   HEMORRHOID SURGERY     HERNIA REPAIR  01/20/2011   incisional   INCISIONAL HERNIA REPAIR  01/20/2011   Procedure: LAPAROSCOPIC INCISIONAL HERNIA;  Surgeon: Judieth Keens, DO;  Location: [facility];  Service: General;  Laterality: N/A;  Laparoscopic repair of a recurrent incisional hernia with Mesh   INGUINAL HERNIA REPAIR     x's 3   VENTRAL HERNIA REPAIR      Family History  Problem Relation Age of  Onset   Breast cancer Mother    Diabetes Mother    Dementia Mother    Liver cancer Mother    Heart disease Father        heart attack   Diabetes Brother    Breast cancer Sister    Colon cancer Neg Hx    Stomach cancer Neg Hx     Social History   Socioeconomic History   Marital status: Divorced    Spouse name: Not on file   Number of children: 1   Years of education: Not on file   Highest education level: Not on file  Occupational History   Occupation: assessment specialist-retired    Comment: city of [place]  Tobacco Use   Smoking status: Former    Packs/day: 1.00    Years: 20.00    Pack years: 20.00    Types: Cigarettes    Quit date: 01/05/1996    Years since quitting: 24.8   Smokeless tobacco: Never  Vaping Use   Vaping Use: Never used  Substance and Sexual Activity   Alcohol use: Yes    Alcohol/week: 4.0 standard drinks    Types: 4 Glasses of wine per week  Comment: occasional wine   Drug use: No   Sexual activity: Not on file  Other Topics Concern   Not on file  Social History Narrative   Exercise 3 times a week   Social Determinants of Health   Financial Resource Strain: Low Risk    Difficulty of Paying Living Expenses: Not hard at all  Food Insecurity: No Food Insecurity   Worried About Charity fundraiser in the Last Year: Never true   Ran Out of Food in the Last Year: Never true  Transportation Needs: No Transportation Needs   Lack of Transportation (Medical): No   Lack of Transportation (Non-Medical): No  Physical Activity: Sufficiently Active   Days of Exercise per Week: 7 days   Minutes of Exercise per Session: 60 min  Stress: No Stress Concern Present   Feeling of Stress : Only a little  Social Connections: Moderately Isolated   Frequency of Communication with Friends and Family: More than three times a week   Frequency of Social Gatherings with Friends and Family: More than three times a week   Attends Religious Services: More than 4  times per year   Active Member of [employer] or Organizations: No   Attends Archivist Meetings: Never   Marital Status: Divorced  Human resources officer Violence: Not At Risk   Fear of Current or Ex-Partner: No   Emotionally Abused: No   Physically Abused: No   Sexually Abused: No    Outpatient Medications Prior to Visit  Medication Sig Dispense Refill   amLODipine (NORVASC) 10 MG tablet Take 1 tablet (10 mg total) by mouth daily. 90 tablet 1   b complex vitamins tablet Take 1 tablet by mouth daily.     Cholecalciferol (VITAMIN D3) 250 MCG (10000 UT) TABS Take by mouth daily.     Fish Oil-Cholecalciferol (FISH OIL + D3 PO) Take 1,200 mg by mouth daily.      fluticasone (FLONASE) 50 MCG/ACT nasal spray Place 2 sprays into both nostrils daily. 16 g 6   glimepiride (AMARYL) 2 MG tablet Take 1 tablet (2 mg total) by mouth daily before breakfast. 90 tablet 3   loratadine (CLARITIN) 10 MG tablet Take 1 tablet (10 mg total) by mouth daily. 30 tablet 11   losartan (COZAAR) 100 MG tablet TK 1 T PO D     montelukast (SINGULAIR) 10 MG tablet Take 1 tablet (10 mg total) by mouth at bedtime. 30 tablet 3   Multiple Vitamin (MULTIVITAMIN) tablet Take 1 tablet by mouth daily. OVER 50 VITAMIN     rosuvastatin (CRESTOR) 5 MG tablet Take 1 tablet (5 mg total) by mouth daily. 90 tablet 3   sitaGLIPtin-metformin (JANUMET) 50-1000 MG tablet TAKE 1 TABLET BY MOUTH TWICE DAILY WITH A MEAL 180 tablet 3   traZODone (DESYREL) 50 MG tablet Take 0.5-1 tablets (25-50 mg total) by mouth at bedtime as needed for sleep. 30 tablet 3   No facility-administered medications prior to visit.    Allergies  Allergen Reactions   Aspirin Nausea Only    Review of Systems  Constitutional:  Negative for fever.  HENT:  Negative for congestion, ear pain, hearing loss, sinus pain and sore throat.   Eyes:  Negative for blurred vision and pain.  Respiratory:  Negative for cough, sputum production, shortness of breath and  wheezing.   Cardiovascular:  Negative for chest pain and palpitations.  Gastrointestinal:  Negative for blood in stool, constipation, diarrhea, nausea and vomiting.  Genitourinary:  Negative  for dysuria, frequency, hematuria and urgency.  Musculoskeletal:  Negative for back pain, falls and myalgias.  Neurological:  Negative for dizziness, sensory change, loss of consciousness, weakness and headaches.  Endo/Heme/Allergies:  Negative for environmental allergies. Does not bruise/bleed easily.  Psychiatric/Behavioral:  Negative for depression and suicidal ideas. The patient is not nervous/anxious and does not have insomnia.       Objective:    Physical Exam Constitutional:      General: She is not in acute distress.    Appearance: Normal appearance. She is not ill-appearing.  HENT:     Head: Normocephalic and atraumatic.     Right Ear: External ear normal.     Left Ear: External ear normal.  Eyes:     Extraocular Movements: Extraocular movements intact.     Pupils: Pupils are equal, round, and reactive to light.  Cardiovascular:     Rate and Rhythm: Normal rate and regular rhythm.     Pulses: Normal pulses.     Heart sounds: Normal heart sounds. No murmur heard.   No gallop.  Pulmonary:     Effort: Pulmonary effort is normal. No respiratory distress.     Breath sounds: Normal breath sounds. No wheezing, rhonchi or rales.  Abdominal:     General: Bowel sounds are normal. There is no distension.     Palpations: Abdomen is soft. There is no mass.     Tenderness: There is no abdominal tenderness. There is no guarding or rebound.     Hernia: No hernia is present.  Musculoskeletal:     Cervical back: Normal range of motion and neck supple.  Lymphadenopathy:     Cervical: No cervical adenopathy.  Skin:    General: Skin is warm and dry.  Neurological:     Mental Status: She is alert and oriented to person, place, and time.  Psychiatric:        Behavior: Behavior normal.    BP (!)  142/80 (BP Location: Left Arm, Patient Position: Sitting, Cuff Size: Normal)   Pulse 87   Temp 98.2 F (36.8 C) (Oral)   Resp 18   Ht 5\' 5"  (1.651 m)   Wt 174 lb (78.9 kg)   SpO2 98%   BMI 28.96 kg/m  Wt Readings from Last 3 Encounters:  10/29/20 174 lb (78.9 kg)  09/07/20 174 lb (78.9 kg)  06/29/20 177 lb (80.3 kg)    Diabetic Foot Exam - Simple   No data filed    Lab Results  Component Value Date   WBC 15.4 (H) 12/09/2019   HGB 11.3 (L) 12/09/2019   HCT 36.2 12/09/2019   PLT 412.0 (H) 12/09/2019   GLUCOSE 103 (H) 06/17/2020   CHOL 155 06/17/2020   TRIG 129.0 06/17/2020   HDL 55.30 06/17/2020   LDLDIRECT 88.0 04/21/2014   LDLCALC 74 06/17/2020   ALT 17 06/17/2020   AST 18 06/17/2020   NA 142 06/17/2020   K 4.3 06/17/2020   CL 105 06/17/2020   CREATININE 0.77 06/17/2020   BUN 14 06/17/2020   CO2 26 06/17/2020   TSH 0.56 02/17/2020   HGBA1C 6.6 (H) 06/17/2020   MICROALBUR 0.8 07/14/2019    Lab Results  Component Value Date   TSH 0.56 02/17/2020   Lab Results  Component Value Date   WBC 15.4 (H) 12/09/2019   HGB 11.3 (L) 12/09/2019   HCT 36.2 12/09/2019   MCV 69.6 (L) 12/09/2019   PLT 412.0 (H) 12/09/2019   Lab Results  Component  Value Date   NA 142 06/17/2020   K 4.3 06/17/2020   CO2 26 06/17/2020   GLUCOSE 103 (H) 06/17/2020   BUN 14 06/17/2020   CREATININE 0.77 06/17/2020   BILITOT 0.7 06/17/2020   ALKPHOS 59 06/17/2020   AST 18 06/17/2020   ALT 17 06/17/2020   PROT 7.5 06/17/2020   ALBUMIN 4.2 06/17/2020   CALCIUM 9.6 06/17/2020   GFR 79.92 06/17/2020   Lab Results  Component Value Date   CHOL 155 06/17/2020   Lab Results  Component Value Date   HDL 55.30 06/17/2020   Lab Results  Component Value Date   LDLCALC 74 06/17/2020   Lab Results  Component Value Date   TRIG 129.0 06/17/2020   Lab Results  Component Value Date   CHOLHDL 3 06/17/2020   Lab Results  Component Value Date   HGBA1C 6.6 (H) 06/17/2020        Assessment & Plan:   Problem List Items Addressed This Visit       Unprioritized   Diabetes mellitus type II, controlled ([place])   Relevant Orders   CBC with Differential/Platelet   Hemoglobin A1c   Essential hypertension    Well controlled, no changes to meds. Encouraged heart healthy diet such as the DASH diet and exercise as tolerated.       Hyperlipidemia    Encourage heart healthy diet such as MIND or DASH diet, increase exercise, avoid trans fats, simple carbohydrates and processed foods, consider a krill or fish or flaxseed oil cap daily.       Uncontrolled type 2 diabetes mellitus with hyperglycemia ([place])    hgba1c to be checked , minimize simple carbs. Increase exercise as tolerated. Continue current meds       Other Visit Diagnoses     Need for influenza vaccination    -  Primary   Relevant Orders   Flu Vaccine QUAD High Dose(Fluad) (Completed)   Low back pain potentially associated with radiculopathy       Relevant Medications   diazepam (VALIUM) 5 MG tablet   gabapentin (NEURONTIN) 100 MG capsule   Other Relevant Orders   MR Lumbar Spine Wo Contrast   Hyperlipidemia associated with type 2 diabetes mellitus ([place])       Relevant Orders   CBC with Differential/Platelet   Comprehensive metabolic panel   Lipid panel   Microalbumin / creatinine urine ratio   Primary hypertension       Relevant Orders   CBC with Differential/Platelet   Comprehensive metabolic panel       Meds ordered this encounter  Medications   diazepam (VALIUM) 5 MG tablet    Sig: Take 1 tablet (5 mg total) by mouth every 12 (twelve) hours as needed for anxiety.    Dispense:  10 tablet    Refill:  0   gabapentin (NEURONTIN) 100 MG capsule    Sig: Take 1 capsule (100 mg total) by mouth 2 (two) times daily.    Dispense:  60 capsule    Refill:  3     I,Megan [name],acting as a scribe for [employer], DO.,have documented all relevant documentation on the behalf of Megan Held, DO,as directed by  Megan Held, DO while in the presence of Megan [name], [place], DO., personally preformed the services described in this documentation.  All medical record entries made by the scribe were at my direction and  in my presence.  I have reviewed the chart and discharge instructions (if applicable) and agree that the record reflects my personal performance and is accurate and complete. 10/29/2020

## 2020-10-29 NOTE — Assessment & Plan Note (Signed)
Well controlled, no changes to meds. Encouraged heart healthy diet such as the DASH diet and exercise as tolerated.  °

## 2020-10-29 NOTE — Patient Instructions (Signed)
Carbohydrate Counting for Diabetes Mellitus, Adult Carbohydrate counting is a method of keeping track of how many carbohydrates you eat. Eating carbohydrates naturally increases the amount of sugar (glucose) in the blood. Counting how many carbohydrates you eat improves your blood glucose control, which helps you manage your diabetes. It is important to know how many carbohydrates you can safely have in each meal. This is different for every person. A dietitian can help you make a meal plan and calculate how many carbohydrates you should have at each meal and snack. What foods contain carbohydrates? Carbohydrates are found in the following foods: Grains, such as breads and cereals. Dried beans and soy products. Starchy vegetables, such as potatoes, peas, and corn. Fruit and fruit juices. Milk and yogurt. Sweets and snack foods, such as cake, cookies, candy, chips, and soft drinks. How do I count carbohydrates in foods? There are two ways to count carbohydrates in food. You can read food labels or learn standard serving sizes of foods. You can use either of the methods or a combination of both. Using the Nutrition Facts label The Nutrition Facts list is included on the labels of almost all packaged foods and beverages in the [place] It includes: The serving size. Information about nutrients in each serving, including the grams (g) of carbohydrate per serving. To use the Nutrition Facts: Decide how many servings you will have. Multiply the number of servings by the number of carbohydrates per serving. The resulting number is the total amount of carbohydrates that you will be having. Learning the standard serving sizes of foods When you eat carbohydrate foods that are not packaged or do not include Nutrition Facts on the label, you need to measure the servings in order to count the amount of carbohydrates. Measure the foods that you will eat with a food scale or measuring cup, if needed. Decide how  many standard-size servings you will eat. Multiply the number of servings by 15. For foods that contain carbohydrates, one serving equals 15 g of carbohydrates. For example, if you eat 2 cups or 10 oz (300 g) of strawberries, you will have eaten 2 servings and 30 g of carbohydrates (2 servings x 15 g = 30 g). For foods that have more than one food mixed, such as soups and casseroles, you must count the carbohydrates in each food that is included. The following list contains standard serving sizes of common carbohydrate-rich foods. Each of these servings has about 15 g of carbohydrates: 1 slice of bread. 1 six-inch (15 cm) tortilla. ? cup or 2 oz (53 g) cooked rice or pasta.  cup or 3 oz (85 g) cooked or canned, drained and rinsed beans or lentils.  cup or 3 oz (85 g) starchy vegetable, such as peas, corn, or squash.  cup or 4 oz (120 g) hot cereal.  cup or 3 oz (85 g) boiled or mashed potatoes, or  or 3 oz (85 g) of a large baked potato.  cup or 4 fl oz (118 mL) fruit juice. 1 cup or 8 fl oz (237 mL) milk. 1 small or 4 oz (106 g) apple.  or 2 oz (63 g) of a medium banana. 1 cup or 5 oz (150 g) strawberries. 3 cups or 1 oz (24 g) popped popcorn. What is an example of carbohydrate counting? To calculate the number of carbohydrates in this sample meal, follow the steps shown below. Sample meal 3 oz (85 g) chicken breast. ? cup or 4 oz (106 g) brown   rice.  cup or 3 oz (85 g) corn. 1 cup or 8 fl oz (237 mL) milk. 1 cup or 5 oz (150 g) strawberries with sugar-free whipped topping. Carbohydrate calculation Identify the foods that contain carbohydrates: Rice. Corn. Milk. Strawberries. Calculate how many servings you have of each food: 2 servings rice. 1 serving corn. 1 serving milk. 1 serving strawberries. Multiply each number of servings by 15 g: 2 servings rice x 15 g = 30 g. 1 serving corn x 15 g = 15 g. 1 serving milk x 15 g = 15 g. 1 serving strawberries x 15 g = 15  g. Add together all of the amounts to find the total grams of carbohydrates eaten: 30 g + 15 g + 15 g + 15 g = 75 g of carbohydrates total. What are tips for following this plan? Shopping Develop a meal plan and then make a shopping list. Buy fresh and frozen vegetables, fresh and frozen fruit, dairy, eggs, beans, lentils, and whole grains. Look at food labels. Choose foods that have more fiber and less sugar. Avoid processed foods and foods with added sugars. Meal planning Aim to have the same amount of carbohydrates at each meal and for each snack time. Plan to have regular, balanced meals and snacks. Where to find more information American Diabetes Association: www.diabetes.org Centers for Disease Control and Prevention: www.cdc.gov Summary Carbohydrate counting is a method of keeping track of how many carbohydrates you eat. Eating carbohydrates naturally increases the amount of sugar (glucose) in the blood. Counting how many carbohydrates you eat improves your blood glucose control, which helps you manage your diabetes. A dietitian can help you make a meal plan and calculate how many carbohydrates you should have at each meal and snack. This information is not intended to replace advice given to you by your health care provider. Make sure you discuss any questions you have with your health care provider. Document Revised: 01/02/2019 Document Reviewed: 01/03/2019 Elsevier Patient Education  2021 Elsevier Inc.  

## 2020-11-01 ENCOUNTER — Telehealth: Payer: Self-pay | Admitting: Family Medicine

## 2020-11-01 NOTE — Telephone Encounter (Signed)
Pt. Has some questions regarding her labs and the instructions that was given to her regarding multivitamin etc.

## 2020-11-02 NOTE — Telephone Encounter (Signed)
Spoke with patient. Pt verbalized understanding  °

## 2020-11-21 ENCOUNTER — Ambulatory Visit
Admission: RE | Admit: 2020-11-21 | Discharge: 2020-11-21 | Disposition: A | Discharge status: Home / Self Care | Payer: Medicare Other | Source: Ambulatory Visit | Attending: Family Medicine | Admitting: Family Medicine

## 2020-11-21 ENCOUNTER — Other Ambulatory Visit: Payer: Self-pay

## 2020-11-21 DIAGNOSIS — M545 Low back pain, unspecified: Secondary | ICD-10-CM

## 2020-11-22 NOTE — Progress Notes (Signed)
Name: Megan [name]  Age/ Sex: 65 y.o., female   MRN/ DOB: [id number], 1955/09/27     PCP: [name], DO   Reason for Endocrinology Evaluation: Type 2 Diabetes Mellitus  Initial Endocrine Consultative Visit: 09/02/2019    PATIENT IDENTIFIER: Megan [name] is a 65 y.o. female with a past medical history of T2DM, HTN and Dyslipidemia. The patient has followed with Endocrinology clinic since 09/02/2019 for consultative assistance with management of her diabetes.  DIABETIC HISTORY:  Megan [name] was diagnosed with DM in 2008.She has been on Metformin, Glimepiride, repaglinide and Januvia . Her hemoglobin A1c has ranged from 6.5% in 2014, peaking at 7.4% in 10/2018  On her initial visit to our clinic she has an A1c of 7.3% . She was on Repaglinide which we stopped due to hypoglycemia, Decreased Glimepiride and continued Janumet.   SUBJECTIVE:   During the last visit (06/29/2020): A1c 6.6 % We continued glimepiride  and Continued Janumet      Today (11/23/2020): Megan [name] is here for a follow up on diabetes management.  She checks her blood sugars 1 times daily, preprandial to breakfast. The patient has not had hypoglycemic episodes since the last clinic visit.  Denies nausea , vomiting or diarrhea    HOME DIABETES REGIMEN:  Janumet 50-1000 mg BID Glimepiride 2 mg , 1 tabs daily     Statin: Declines  ACE-I/ARB: yes    METER DOWNLOAD SUMMARY: Unable to download  107 - 354   DIABETIC COMPLICATIONS: Microvascular complications:   Denies: CKD, retinopathy, neuropathy Last Eye Exam: Completed 07/15/2020  Macrovascular complications:   Denies: CAD, CVA, PVD   HISTORY:  Past Medical History:  Past Medical History:  Diagnosis Date   Allergy    Arthritis    fingers   Colon polyp    Diabetes mellitus    Diverticulosis    GERD (gastroesophageal reflux disease)    occasionally   H/O hiatal hernia    ?   Hemorrhoids    Hyperlipidemia     takes fish oil   Hypertension    Infectious colitis    Ventral hernia    Past Surgical History:  Past Surgical History:  Procedure Laterality Date   colonscopy     DIAGNOSTIC LAPAROSCOPY     FOOT SURGERY Bilateral    hammer toes   HEMORRHOID SURGERY     HERNIA REPAIR  01/20/2011   incisional   INCISIONAL HERNIA REPAIR  01/20/2011   Procedure: LAPAROSCOPIC INCISIONAL HERNIA;  Surgeon: Judieth Keens, DO;  Location: [facility];  Service: General;  Laterality: N/A;  Laparoscopic repair of a recurrent incisional hernia with Mesh   INGUINAL HERNIA REPAIR     x's 3   VENTRAL HERNIA REPAIR     Social History:  reports that she quit smoking about 24 years ago. Her smoking use included cigarettes. She has a 20.00 pack-year smoking history. She has never used smokeless tobacco. She reports current alcohol use of about 4.0 standard drinks per week. She reports that she does not use drugs. Family History:  Family History  Problem Relation Age of Onset   Breast cancer Mother    Diabetes Mother    Dementia Mother    Liver cancer Mother    Heart disease Father        heart attack   Diabetes Brother    Breast cancer Sister    Colon cancer Neg Hx    Stomach cancer Neg  Hx      HOME MEDICATIONS: Allergies as of 11/23/2020       Reactions   Aspirin Nausea Only        Medication List        Accurate as of November 23, 2020  8:30 AM. If you have any questions, ask your nurse or doctor.          amLODipine 10 MG tablet Commonly known as: NORVASC Take 1 tablet (10 mg total) by mouth daily.   b complex vitamins tablet Take 1 tablet by mouth daily.   diazepam 5 MG tablet Commonly known as: VALIUM Take 1 tablet (5 mg total) by mouth every 12 (twelve) hours as needed for anxiety.   FISH OIL + D3 PO Take 1,200 mg by mouth daily.   fluticasone 50 MCG/ACT nasal spray Commonly known as: FLONASE Place 2 sprays into both nostrils daily.   gabapentin 100 MG capsule Commonly known  as: NEURONTIN Take 1 capsule (100 mg total) by mouth 2 (two) times daily.   glimepiride 2 MG tablet Commonly known as: AMARYL Take 1 tablet (2 mg total) by mouth daily before breakfast.   Janumet 50-1000 MG tablet Generic drug: sitaGLIPtin-metformin TAKE 1 TABLET BY MOUTH TWICE DAILY WITH A MEAL   loratadine 10 MG tablet Commonly known as: CLARITIN Take 1 tablet (10 mg total) by mouth daily.   losartan 100 MG tablet Commonly known as: COZAAR TK 1 T PO D   montelukast 10 MG tablet Commonly known as: SINGULAIR Take 1 tablet (10 mg total) by mouth at bedtime.   multivitamin tablet Take 1 tablet by mouth daily. OVER 50 VITAMIN   rosuvastatin 5 MG tablet Commonly known as: Crestor Take 1 tablet (5 mg total) by mouth daily.   traZODone 50 MG tablet Commonly known as: DESYREL Take 0.5-1 tablets (25-50 mg total) by mouth at bedtime as needed for sleep.   Vitamin D3 250 MCG (10000 UT) Tabs Take by mouth daily.         OBJECTIVE:   Vital Signs: BP 140/84 (BP Location: Left Arm, Patient Position: Sitting, Cuff Size: Small)   Pulse 84   Ht 5\' 5"  (1.651 m)   Wt 175 lb 6.4 oz (79.6 kg)   SpO2 98%   BMI 29.19 kg/m   Wt Readings from Last 3 Encounters:  11/23/20 175 lb 6.4 oz (79.6 kg)  10/29/20 174 lb (78.9 kg)  09/07/20 174 lb (78.9 kg)     Exam: General: Pt appears well and is in NAD  Neck: General: Supple without adenopathy. Thyroid: Thyroid size normal.  No goiter or nodules appreciated. No thyroid bruit.  Lungs: Clear with good BS bilat with no rales, rhonchi, or wheezes  Heart: RRR   Abdomen: Normoactive bowel sounds, soft, nontender, without masses or organomegaly palpable  Extremities: No pretibial edema.  Neuro: MS is good with appropriate affect, pt is alert and Ox3    DM foot exam  11/23/2020 The skin of the feet is intact without sores or ulcerations. The pedal pulses are 2+ on right and 2+ on left. The sensation is intact to a screening 5.07, 10  gram monofilament bilaterally     DATA REVIEWED:  Lab Results  Component Value Date   HGBA1C 6.4 10/29/2020   HGBA1C 6.6 (H) 06/17/2020   HGBA1C 5.9 (A) 02/17/2020   Lab Results  Component Value Date   MICROALBUR 4.1 (H) 10/29/2020   LDLCALC 82 10/29/2020   CREATININE 0.71 10/29/2020  Lab Results  Component Value Date   MICRALBCREAT 5.4 10/29/2020     Lab Results  Component Value Date   CHOL 171 10/29/2020   HDL 56.70 10/29/2020   LDLCALC 82 10/29/2020   LDLDIRECT 88.0 04/21/2014   TRIG 159.0 (H) 10/29/2020   CHOLHDL 3 10/29/2020         Right Nodule FNA 05/16/2012  THYROID, FINE NEEDLE ASPIRATION, RIGHT BENIGN. FINDINGS CONSISTENT WITH NON-NEOPLASTIC GOITER.   ASSESSMENT / PLAN / RECOMMENDATIONS:   1) Type 2 Diabetes Mellitus, Optimally controlled, Without complications - Most recent A1c of 6.4 %. Goal A1c < 7.0 %.    - A1c is optimal  without hypoglycemia.  - No changes today  - We discussed that if her A1c continues to trend down, will reduce Glimepiride to 1 mg in the future, will continue at 2 mg given the holidays around the corner and have started attending family dinners      MEDICATIONS: - Continue Glimepiride 2 mg, one  tablet before Breakfast  - Continue Janumet 50-1000 mg twice daily   EDUCATION / INSTRUCTIONS: BG monitoring instructions: Patient is instructed to check her blood sugars 1 times a day, fasting . Call [facility] if: BG persistently < 70 I reviewed the Rule of 15 for the treatment of hypoglycemia in detail with the patient. Literature supplied.   2) Diabetic complications:  Eye: Does not have known diabetic retinopathy.  Neuro/ Feet: Does not have known diabetic peripheral neuropathy .  Renal: Patient does not have known baseline CKD. She   is on an ACEI/ARB at present.     3) Lipid:  - She is not happy about being on Rosuvastatin 5 mg daily, LDL 82 mg/dL. I explained to the pt the cardiovascular benefits  of statin and Have encouraged her to continue .   4) Multinodular Goiter:  - She has been diagnosed with MNG in 2014. She is S/P benign FNA of the right nodule in 2014 with benign cytology. Ultrasound in 2020 confirmed 5 year stability  - At this time clinical monitoring is recommended  - TFT's normal  - Will continue to follow   F/U in 6 months    Signed electronically by: Mack Guise, MD  [facility]  [facility] [address] [place], [place] [address] Phone: [phone] FAX: [phone]   CC: [name], DO [address] [place] [address] Phone: [phone]  Fax: [phone]  Return to Endocrinology clinic as below: Future Appointments  Date Time Provider [place]  12/21/2020  8:40 AM [name], DO [facility]  09/12/2021  1:40 PM [facility]

## 2020-11-23 ENCOUNTER — Telehealth: Payer: Self-pay | Admitting: Family Medicine

## 2020-11-23 ENCOUNTER — Other Ambulatory Visit: Payer: Self-pay

## 2020-11-23 ENCOUNTER — Encounter: Payer: Self-pay | Admitting: Internal Medicine

## 2020-11-23 ENCOUNTER — Other Ambulatory Visit: Payer: Self-pay | Admitting: Family Medicine

## 2020-11-23 ENCOUNTER — Other Ambulatory Visit (HOSPITAL_BASED_OUTPATIENT_CLINIC_OR_DEPARTMENT_OTHER): Payer: Self-pay

## 2020-11-23 ENCOUNTER — Ambulatory Visit (INDEPENDENT_AMBULATORY_CARE_PROVIDER_SITE_OTHER): Payer: Medicare Other | Admitting: Internal Medicine

## 2020-11-23 VITALS — BP 140/84 | HR 84 | Ht 65.0 in | Wt 175.4 lb

## 2020-11-23 DIAGNOSIS — E042 Nontoxic multinodular goiter: Secondary | ICD-10-CM | POA: Diagnosis not present

## 2020-11-23 DIAGNOSIS — E119 Type 2 diabetes mellitus without complications: Secondary | ICD-10-CM

## 2020-11-23 DIAGNOSIS — Z9189 Other specified personal risk factors, not elsewhere classified: Secondary | ICD-10-CM | POA: Diagnosis not present

## 2020-11-23 DIAGNOSIS — F409 Phobic anxiety disorder, unspecified: Secondary | ICD-10-CM

## 2020-11-23 DIAGNOSIS — M545 Low back pain, unspecified: Secondary | ICD-10-CM

## 2020-11-23 MED ORDER — ALPRAZOLAM 0.5 MG PO TABS: 0.5000 mg | ORAL_TABLET | Freq: Every evening | ORAL | 0 refills | Status: DC | PRN

## 2020-11-23 MED ORDER — SHINGRIX 50 MCG/0.5ML IM SUSR
INTRAMUSCULAR | 1 refills | Status: DC
  Filled 2020-11-23: qty 0.5, 1d supply, fill #0

## 2020-11-23 NOTE — Patient Instructions (Signed)
-   Keep up the Good work ! - Continue Glimepiride 2 mg, one  tablet before Breakfast  - Continue Janumet 50-1000 mg twice daily      HOW TO TREAT LOW BLOOD SUGARS (Blood sugar LESS THAN 70 MG/DL) Please follow the RULE OF 15 for the treatment of hypoglycemia treatment (when your (blood sugars are less than 70 mg/dL)   STEP 1: Take 15 grams of carbohydrates when your blood sugar is low, which includes:  3-4 GLUCOSE TABS  OR 3-4 OZ OF JUICE OR REGULAR SODA OR ONE TUBE OF GLUCOSE GEL    STEP 2: RECHECK blood sugar in 15 MINUTES STEP 3: If your blood sugar is still low at the 15 minute recheck --> then, go back to STEP 1 and treat AGAIN with another 15 grams of carbohydrates.

## 2020-11-23 NOTE — Telephone Encounter (Signed)
Pt came in for her appt and informed that the mri made her claustrophobic and she needs to use something that does not cover her face. Please advise.

## 2020-11-23 NOTE — Telephone Encounter (Signed)
Pt called and LVM with below info

## 2020-11-25 NOTE — Telephone Encounter (Signed)
error 

## 2020-11-29 ENCOUNTER — Telehealth: Payer: Self-pay | Admitting: Family Medicine

## 2020-11-29 NOTE — Telephone Encounter (Signed)
Pt called regarding recent referral for MRI. Advised pt to contact [place]. pt contacted [place] back and stated that she is claustrophobic and can not be in areas where she has to lay down. Pt would like for someone to contact her to go over referral and medication. Please advise.

## 2020-12-03 NOTE — Telephone Encounter (Signed)
Spoke with patient. Pt states [facility] does not have a open MRI. We will need to send the order somewhere that has a open MRI or a stand up MRI. Pt states she does not want to take the Xanax. She also reports she will be going on vacation starting Monday and will be back on the 28th.

## 2020-12-03 NOTE — Telephone Encounter (Signed)
Pt called. LVM to return call 

## 2020-12-05 DIAGNOSIS — Z20822 Contact with and (suspected) exposure to covid-19: Secondary | ICD-10-CM | POA: Diagnosis not present

## 2020-12-05 DIAGNOSIS — Z03818 Encounter for observation for suspected exposure to other biological agents ruled out: Secondary | ICD-10-CM | POA: Diagnosis not present

## 2020-12-07 ENCOUNTER — Other Ambulatory Visit (HOSPITAL_BASED_OUTPATIENT_CLINIC_OR_DEPARTMENT_OTHER): Payer: Self-pay | Admitting: Family Medicine

## 2020-12-07 DIAGNOSIS — M545 Low back pain, unspecified: Secondary | ICD-10-CM

## 2020-12-21 ENCOUNTER — Encounter: Payer: Self-pay | Admitting: Family Medicine

## 2020-12-21 ENCOUNTER — Ambulatory Visit (INDEPENDENT_AMBULATORY_CARE_PROVIDER_SITE_OTHER): Payer: Medicare Other | Admitting: Family Medicine

## 2020-12-21 VITALS — BP 130/80 | HR 83 | Temp 98.3°F | Resp 18 | Ht 65.0 in | Wt 174.8 lb

## 2020-12-21 DIAGNOSIS — E785 Hyperlipidemia, unspecified: Secondary | ICD-10-CM | POA: Diagnosis not present

## 2020-12-21 DIAGNOSIS — E1169 Type 2 diabetes mellitus with other specified complication: Secondary | ICD-10-CM

## 2020-12-21 DIAGNOSIS — E041 Nontoxic single thyroid nodule: Secondary | ICD-10-CM

## 2020-12-21 DIAGNOSIS — M545 Low back pain, unspecified: Secondary | ICD-10-CM | POA: Diagnosis not present

## 2020-12-21 DIAGNOSIS — E042 Nontoxic multinodular goiter: Secondary | ICD-10-CM

## 2020-12-21 DIAGNOSIS — I10 Essential (primary) hypertension: Secondary | ICD-10-CM | POA: Diagnosis not present

## 2020-12-21 DIAGNOSIS — Z0000 Encounter for general adult medical examination without abnormal findings: Secondary | ICD-10-CM | POA: Diagnosis not present

## 2020-12-21 DIAGNOSIS — E118 Type 2 diabetes mellitus with unspecified complications: Secondary | ICD-10-CM | POA: Diagnosis not present

## 2020-12-21 DIAGNOSIS — E1165 Type 2 diabetes mellitus with hyperglycemia: Secondary | ICD-10-CM

## 2020-12-21 NOTE — Assessment & Plan Note (Signed)
Encourage heart healthy diet such as MIND or DASH diet, increase exercise, avoid trans fats, simple carbohydrates and processed foods, consider a krill or fish or flaxseed oil cap daily.  °

## 2020-12-21 NOTE — Assessment & Plan Note (Addendum)
Pt is claustrophobic we are trying to find a standing mri   She is unable to do a regular open mri

## 2020-12-21 NOTE — Assessment & Plan Note (Signed)
Check labs hgba1c to be checked.  minimize simple carbs. Increase exercise as tolerated. Continue current meds  

## 2020-12-21 NOTE — Assessment & Plan Note (Signed)
Well controlled, no changes to meds. Encouraged heart healthy diet such as the DASH diet and exercise as tolerated.  °

## 2020-12-21 NOTE — Progress Notes (Signed)
Subjective:   By signing my name below, I, Megan [name], attest that this documentation has been prepared under the direction and in the presence of Dr. Roma Schanz, DO. 12/21/2020    Patient ID: Megan [name], female    DOB: 1955/06/25, 65 y.o.   MRN: [id number]  Chief Complaint  Patient presents with   Annual Exam    Pt states fasting     HPI Patient is in today for a comprehensive physical exam.  She complains of a painful wart on her left foot. She has seen another specialist to have it removed but found it persisted after its removal. She is planning to let her other provider know of her symptoms.  She has not scheduled an appointment for an MRI on her back. She has difficulty being in enclosed spaces while laying down and is requesting a standing MRI machine. She continues having back pain at this time.  She continues measuring her blood sugar regularly and reports normal readings. She continues taking 50-1000 mg Janumet daily PO and reports no new issues while taking it.  She denies having any fever, new moles, congestion, sore throat, new muscle pain, new joint pain, chest pain, cough, SOB, wheezing, n/v/d, constipation, blood in stool, dysuria, frequency, hematuria, or headaches at this time. She has no recent changes to her family medical history. She does not use tobacco products.  She continues exercising regularly by walking 4x every week. Her goal is to reach 164 lb's.  She has 3 Covid-19 vaccines. She was informed of the bivalent Covid-19 vaccine and is not interested in receiving it. She has received the 1st shingles vaccine.  She is UTD on vision care.    Past Medical History:  Diagnosis Date   Allergy    Arthritis    fingers   Colon polyp    Diabetes mellitus    Diverticulosis    GERD (gastroesophageal reflux disease)    occasionally   H/O hiatal hernia    ?   Hemorrhoids    Hyperlipidemia    takes fish oil   Hypertension    Infectious colitis     Ventral hernia     Past Surgical History:  Procedure Laterality Date   colonscopy     DIAGNOSTIC LAPAROSCOPY     FOOT SURGERY Bilateral    hammer toes   HEMORRHOID SURGERY     HERNIA REPAIR  01/20/2011   incisional   INCISIONAL HERNIA REPAIR  01/20/2011   Procedure: LAPAROSCOPIC INCISIONAL HERNIA;  Surgeon: Judieth Keens, DO;  Location: [facility];  Service: General;  Laterality: N/A;  Laparoscopic repair of a recurrent incisional hernia with Mesh   INGUINAL HERNIA REPAIR     x's 3   VENTRAL HERNIA REPAIR      Family History  Problem Relation Age of Onset   Breast cancer Mother    Diabetes Mother    Dementia Mother    Liver cancer Mother    Heart disease Father        heart attack   Diabetes Brother    Breast cancer Sister    Colon cancer Neg Hx    Stomach cancer Neg Hx     Social History   Socioeconomic History   Marital status: Divorced    Spouse name: Not on file   Number of children: 1   Years of education: Not on file   Highest education level: Not on file  Occupational History   Occupation: assessment specialist-retired  Comment: city of [place]  Tobacco Use   Smoking status: Former    Packs/day: 1.00    Years: 20.00    Pack years: 20.00    Types: Cigarettes    Quit date: 01/05/1996    Years since quitting: 24.9   Smokeless tobacco: Never  Vaping Use   Vaping Use: Never used  Substance and Sexual Activity   Alcohol use: Yes    Alcohol/week: 4.0 standard drinks    Types: 4 Glasses of wine per week    Comment: occasional wine   Drug use: No   Sexual activity: Not Currently  Other Topics Concern   Not on file  Social History Narrative   Exercise 3-4  times a week   Social Determinants of Health   Financial Resource Strain: Low Risk    Difficulty of Paying Living Expenses: Not hard at all  Food Insecurity: No Food Insecurity   Worried About Charity fundraiser in the Last Year: Never true   Ran Out of Food in the Last Year: Never true   Transportation Needs: No Transportation Needs   Lack of Transportation (Medical): No   Lack of Transportation (Non-Medical): No  Physical Activity: Sufficiently Active   Days of Exercise per Week: 7 days   Minutes of Exercise per Session: 60 min  Stress: No Stress Concern Present   Feeling of Stress : Only a little  Social Connections: Moderately Isolated   Frequency of Communication with Friends and Family: More than three times a week   Frequency of Social Gatherings with Friends and Family: More than three times a week   Attends Religious Services: More than 4 times per year   Active Member of [employer] or Organizations: No   Attends Archivist Meetings: Never   Marital Status: Divorced  Human resources officer Violence: Not At Risk   Fear of Current or Ex-Partner: No   Emotionally Abused: No   Physically Abused: No   Sexually Abused: No    Outpatient Medications Prior to Visit  Medication Sig Dispense Refill   ALPRAZolam (XANAX) 0.5 MG tablet Take 1 tablet (0.5 mg total) by mouth at bedtime as needed for anxiety. 10 tablet 0   amLODipine (NORVASC) 10 MG tablet Take 1 tablet (10 mg total) by mouth daily. 90 tablet 1   b complex vitamins tablet Take 1 tablet by mouth daily.     Cholecalciferol (VITAMIN D3) 250 MCG (10000 UT) TABS Take by mouth daily.     diazepam (VALIUM) 5 MG tablet Take 1 tablet (5 mg total) by mouth every 12 (twelve) hours as needed for anxiety. 10 tablet 0   Fish Oil-Cholecalciferol (FISH OIL + D3 PO) Take 1,200 mg by mouth daily.      fluticasone (FLONASE) 50 MCG/ACT nasal spray Place 2 sprays into both nostrils daily. 16 g 6   gabapentin (NEURONTIN) 100 MG capsule Take 1 capsule (100 mg total) by mouth 2 (two) times daily. 60 capsule 3   glimepiride (AMARYL) 2 MG tablet Take 1 tablet (2 mg total) by mouth daily before breakfast. 90 tablet 3   loratadine (CLARITIN) 10 MG tablet Take 1 tablet (10 mg total) by mouth daily. 30 tablet 11   losartan (COZAAR) 100  MG tablet TK 1 T PO D     montelukast (SINGULAIR) 10 MG tablet Take 1 tablet (10 mg total) by mouth at bedtime. 30 tablet 3   Multiple Vitamin (MULTIVITAMIN) tablet Take 1 tablet by mouth daily. OVER 50 VITAMIN  rosuvastatin (CRESTOR) 5 MG tablet Take 1 tablet (5 mg total) by mouth daily. 90 tablet 3   sitaGLIPtin-metformin (JANUMET) 50-1000 MG tablet TAKE 1 TABLET BY MOUTH TWICE DAILY WITH A MEAL 180 tablet 3   traZODone (DESYREL) 50 MG tablet Take 0.5-1 tablets (25-50 mg total) by mouth at bedtime as needed for sleep. 30 tablet 3   Zoster Vaccine Adjuvanted [facility]) injection Inject into the muscle. 0.5 mL 1   No facility-administered medications prior to visit.    Allergies  Allergen Reactions   Aspirin Nausea Only    Review of Systems  Constitutional:  Negative for fever.  HENT:  Negative for congestion and sore throat.   Respiratory:  Negative for cough, shortness of breath and wheezing.   Cardiovascular:  Negative for chest pain.  Gastrointestinal:  Negative for blood in stool, constipation, diarrhea, nausea and vomiting.  Genitourinary:  Negative for dysuria, frequency and hematuria.  Musculoskeletal:  Negative for joint pain and myalgias.  Skin:        (-)New moles  Neurological:  Negative for headaches.      Objective:    Physical Exam Constitutional:      General: She is not in acute distress.    Appearance: Normal appearance. She is not ill-appearing.  HENT:     Head: Normocephalic and atraumatic.     Right Ear: Tympanic membrane, ear canal and external ear normal.     Left Ear: Tympanic membrane, ear canal and external ear normal.  Eyes:     Extraocular Movements: Extraocular movements intact.     Pupils: Pupils are equal, round, and reactive to light.  Cardiovascular:     Rate and Rhythm: Normal rate and regular rhythm.     Heart sounds: Normal heart sounds. No murmur heard.   No gallop.  Pulmonary:     Effort: Pulmonary effort is normal. No  respiratory distress.     Breath sounds: Normal breath sounds. No wheezing or rales.  Abdominal:     General: Bowel sounds are normal. There is no distension.     Palpations: Abdomen is soft.     Tenderness: There is no abdominal tenderness. There is no guarding.  Skin:    General: Skin is warm and dry.  Neurological:     Mental Status: She is alert and oriented to person, place, and time.  Psychiatric:        Behavior: Behavior normal.    BP 130/80 (BP Location: Left Arm, Patient Position: Sitting, Cuff Size: Normal)   Pulse 83   Temp 98.3 F (36.8 C) (Oral)   Resp 18   Ht 5\' 5"  (1.651 m)   Wt 174 lb 12.8 oz (79.3 kg)   SpO2 98%   BMI 29.09 kg/m  Wt Readings from Last 3 Encounters:  12/21/20 174 lb 12.8 oz (79.3 kg)  11/23/20 175 lb 6.4 oz (79.6 kg)  10/29/20 174 lb (78.9 kg)    Diabetic Foot Exam - Simple   No data filed    Lab Results  Component Value Date   WBC 7.9 10/29/2020   HGB 11.8 (L) 10/29/2020   HCT 38.0 10/29/2020   PLT 376.0 10/29/2020   GLUCOSE 106 (H) 10/29/2020   CHOL 171 10/29/2020   TRIG 159.0 (H) 10/29/2020   HDL 56.70 10/29/2020   LDLDIRECT 88.0 04/21/2014   LDLCALC 82 10/29/2020   ALT 18 10/29/2020   AST 18 10/29/2020   NA 138 10/29/2020   K 3.8 10/29/2020   CL 102  10/29/2020   CREATININE 0.71 10/29/2020   BUN 12 10/29/2020   CO2 29 10/29/2020   TSH 0.56 02/17/2020   HGBA1C 6.4 10/29/2020   MICROALBUR 4.1 (H) 10/29/2020    Lab Results  Component Value Date   TSH 0.56 02/17/2020   Lab Results  Component Value Date   WBC 7.9 10/29/2020   HGB 11.8 (L) 10/29/2020   HCT 38.0 10/29/2020   MCV 68.1 Repeated and verified X2. (L) 10/29/2020   PLT 376.0 10/29/2020   Lab Results  Component Value Date   NA 138 10/29/2020   K 3.8 10/29/2020   CO2 29 10/29/2020   GLUCOSE 106 (H) 10/29/2020   BUN 12 10/29/2020   CREATININE 0.71 10/29/2020   BILITOT 0.9 10/29/2020   ALKPHOS 59 10/29/2020   AST 18 10/29/2020   ALT 18 10/29/2020    PROT 7.7 10/29/2020   ALBUMIN 4.3 10/29/2020   CALCIUM 9.7 10/29/2020   GFR 87.86 10/29/2020   Lab Results  Component Value Date   CHOL 171 10/29/2020   Lab Results  Component Value Date   HDL 56.70 10/29/2020   Lab Results  Component Value Date   LDLCALC 82 10/29/2020   Lab Results  Component Value Date   TRIG 159.0 (H) 10/29/2020   Lab Results  Component Value Date   CHOLHDL 3 10/29/2020   Lab Results  Component Value Date   HGBA1C 6.4 10/29/2020   Mammogram- She reports being UTD on mammograms.  She completes them regularly at her GYN's office.  Colonoscopy- Last completed 10/25/2016. Results showed diverticulosis in entire colon, otherwise results are normal. Repeat in 10 years.      Assessment & Plan:   Problem List Items Addressed This Visit       Unprioritized   Thyroid nodule   Relevant Orders   TSH   Type 2 diabetes mellitus with hyperglycemia, without long-term current use of insulin ([place]) - Primary   Relevant Orders   Comprehensive metabolic panel   Hemoglobin A1c   Lipid panel   CBC with Differential/Platelet   Diabetes mellitus type II, controlled ([place])    Check labs hgba1c to be checked minimize simple carbs. Increase exercise as tolerated. Continue current meds       Essential hypertension    Well controlled, no changes to meds. Encouraged heart healthy diet such as the DASH diet and exercise as tolerated.       Hyperlipidemia    Encourage heart healthy diet such as MIND or DASH diet, increase exercise, avoid trans fats, simple carbohydrates and processed foods, consider a krill or fish or flaxseed oil cap daily.       Low back pain with radiation    Pt is claustrophobic we are trying to find a standing mri   She is unable to do a regular open mri       Multinodular goiter    Check labs      Other Visit Diagnoses     Primary hypertension       Relevant Orders   Comprehensive metabolic panel   Hemoglobin A1c   Lipid panel    CBC with Differential/Platelet   Hyperlipidemia associated with type 2 diabetes mellitus ([place])       Relevant Orders   Comprehensive metabolic panel   Hemoglobin A1c   Lipid panel   CBC with Differential/Platelet        No orders of the defined types were placed in this encounter.   I, Dr. Carollee Herter,  Megan Fries, DO, personally preformed the services described in this documentation.  All medical record entries made by the scribe were at my direction and in my presence.  I have reviewed the chart and discharge instructions (if applicable) and agree that the record reflects my personal performance and is accurate and complete. 12/21/2020   I,Megan [name],acting as a scribe for Megan Held, DO.,have documented all relevant documentation on the behalf of Megan Held, DO,as directed by  Megan Held, DO while in the presence of Megan Held, DO.   Megan Held, DO

## 2020-12-21 NOTE — Assessment & Plan Note (Signed)
ghm utd Check labs  See avs  

## 2020-12-21 NOTE — Assessment & Plan Note (Signed)
Check labs 

## 2020-12-21 NOTE — Patient Instructions (Signed)
Preventive Care 52 Years and Older, Female Preventive care refers to lifestyle choices and visits with your health care provider that can promote health and wellness. Preventive care visits are also called wellness exams. What can I expect for my preventive care visit? Counseling Your health care provider may ask you questions about your: Medical history, including: Past medical problems. Family medical history. Pregnancy and menstrual history. History of falls. Current health, including: Memory and ability to understand (cognition). Emotional well-being. Home life and relationship well-being. Sexual activity and sexual health. Lifestyle, including: Alcohol, nicotine or tobacco, and drug use. Access to firearms. Diet, exercise, and sleep habits. Work and work Statistician. Sunscreen use. Safety issues such as seatbelt and bike helmet use. Physical exam Your health care provider will check your: Height and weight. These may be used to calculate your BMI (body mass index). BMI is a measurement that tells if you are at a healthy weight. Waist circumference. This measures the distance around your waistline. This measurement also tells if you are at a healthy weight and may help predict your risk of certain diseases, such as type 2 diabetes and high blood pressure. Heart rate and blood pressure. Body temperature. Skin for abnormal spots. What immunizations do I need? Vaccines are usually given at various ages, according to a schedule. Your health care provider will recommend vaccines for you based on your age, medical history, and lifestyle or other factors, such as travel or where you work. What tests do I need? Screening Your health care provider may recommend screening tests for certain conditions. This may include: Lipid and cholesterol levels. Hepatitis C test. Hepatitis B test. HIV (human immunodeficiency virus) test. STI (sexually transmitted infection) testing, if you are at  risk. Lung cancer screening. Colorectal cancer screening. Diabetes screening. This is done by checking your blood sugar (glucose) after you have not eaten for a while (fasting). Mammogram. Talk with your health care provider about how often you should have regular mammograms. BRCA-related cancer screening. This may be done if you have a family history of breast, ovarian, tubal, or peritoneal cancers. Bone density scan. This is done to screen for osteoporosis. Talk with your health care provider about your test results, treatment options, and if necessary, the need for more tests. Follow these instructions at home: Eating and drinking  Eat a diet that includes fresh fruits and vegetables, whole grains, lean protein, and low-fat dairy products. Limit your intake of foods with high amounts of sugar, saturated fats, and salt. Take vitamin and mineral supplements as recommended by your health care provider. Do not drink alcohol if your health care provider tells you not to drink. If you drink alcohol: Limit how much you have to 0-1 drink a day. Know how much alcohol is in your drink. In the [place] one drink equals one 12 oz bottle of beer (355 mL), one 5 oz glass of wine (148 mL), or one 1 oz glass of hard liquor (44 mL). Lifestyle Brush your teeth every morning and night with fluoride toothpaste. Floss one time each day. Exercise for at least 30 minutes 5 or more days each week. Do not use any products that contain nicotine or tobacco. These products include cigarettes, chewing tobacco, and vaping devices, such as e-cigarettes. If you need help quitting, ask your health care provider. Do not use drugs. If you are sexually active, practice safe sex. Use a condom or other form of protection in order to prevent STIs. Take aspirin only as told by your  health care provider. Make sure that you understand how much to take and what form to take. Work with your health care provider to find out whether it  is safe and beneficial for you to take aspirin daily. Ask your health care provider if you need to take a cholesterol-lowering medicine (statin). Find healthy ways to manage stress, such as: Meditation, yoga, or listening to music. Journaling. Talking to a trusted person. Spending time with friends and family. Minimize exposure to UV radiation to reduce your risk of skin cancer. Safety Always wear your seat belt while driving or riding in a vehicle. Do not drive: If you have been drinking alcohol. Do not ride with someone who has been drinking. When you are tired or distracted. While texting. If you have been using any mind-altering substances or drugs. Wear a helmet and other protective equipment during sports activities. If you have firearms in your house, make sure you follow all gun safety procedures. What's next? Visit your health care provider once a year for an annual wellness visit. Ask your health care provider how often you should have your eyes and teeth checked. Stay up to date on all vaccines. This information is not intended to replace advice given to you by your health care provider. Make sure you discuss any questions you have with your health care provider. Document Revised: 06/30/2020 Document Reviewed: 06/30/2020 Elsevier Patient Education  [place].

## 2021-01-03 ENCOUNTER — Other Ambulatory Visit: Payer: Self-pay | Admitting: Family Medicine

## 2021-01-03 DIAGNOSIS — L565 Disseminated superficial actinic porokeratosis (DSAP): Secondary | ICD-10-CM | POA: Diagnosis not present

## 2021-01-13 NOTE — Telephone Encounter (Signed)
Patient states the location in [place] does not have any records of an MRI for her and that we can send it to fax: [phone]. Phone number [phone] Judson Roch). Patient would like a call back when it's completed so she can call them and set up appointment. Please advice.

## 2021-01-22 DIAGNOSIS — U071 COVID-19: Secondary | ICD-10-CM | POA: Diagnosis not present

## 2021-01-22 DIAGNOSIS — Z03818 Encounter for observation for suspected exposure to other biological agents ruled out: Secondary | ICD-10-CM | POA: Diagnosis not present

## 2021-02-05 DIAGNOSIS — M4316 Spondylolisthesis, lumbar region: Secondary | ICD-10-CM | POA: Diagnosis not present

## 2021-02-05 DIAGNOSIS — M545 Low back pain, unspecified: Secondary | ICD-10-CM | POA: Diagnosis not present

## 2021-02-05 DIAGNOSIS — M48061 Spinal stenosis, lumbar region without neurogenic claudication: Secondary | ICD-10-CM | POA: Diagnosis not present

## 2021-02-14 ENCOUNTER — Telehealth: Payer: Self-pay

## 2021-02-14 ENCOUNTER — Other Ambulatory Visit: Payer: Self-pay

## 2021-02-14 DIAGNOSIS — M4800 Spinal stenosis, site unspecified: Secondary | ICD-10-CM

## 2021-02-14 NOTE — Telephone Encounter (Signed)
Was able to find imaging report and spoke with [facility] and patient. Pt was advised that we will be placing referral for Neurosurgery.

## 2021-02-14 NOTE — Telephone Encounter (Signed)
Spoke with patient. Pt was advised I reviewed her chart and didn't see any imaging faxed over. I advised patient to have the imaging center fax over the noted.

## 2021-02-14 NOTE — Telephone Encounter (Signed)
Caller Name [place] Phone Number [phone] Patient Name Megan [name] Patient DOB 08-25-1955 Call Type Message Only Information Provided Reason for Call Request for Lab/Test Results Initial Comment Caller states she is needing information on test results. hours information provided as needed no triage Disp. Time Disposition Final User 02/14/2021 12:51:16 PM General Information Provided Yes Domingo Dimes, [place] Call Closed By: Memory Argue Transaction Date/Time: 02/14/2021 12:49:10 PM (ET)

## 2021-02-21 DIAGNOSIS — M5416 Radiculopathy, lumbar region: Secondary | ICD-10-CM | POA: Diagnosis not present

## 2021-02-21 DIAGNOSIS — I10 Essential (primary) hypertension: Secondary | ICD-10-CM | POA: Diagnosis not present

## 2021-02-21 DIAGNOSIS — U071 COVID-19: Secondary | ICD-10-CM | POA: Diagnosis not present

## 2021-02-24 ENCOUNTER — Telehealth: Payer: Self-pay | Admitting: Family Medicine

## 2021-02-24 NOTE — Telephone Encounter (Signed)
Pt is requesting refill. She stated pharmacy has sent over a request as well.    Medication: losartan (COZAAR) 100 MG tablet  Has the patient contacted their pharmacy? Yes.    Preferred Pharmacy: [facility] [place] [place] - [place] AT [address] [facility]  [address] [place] [address]  Phone:  [phone]  Fax:  [phone]

## 2021-02-25 MED ORDER — LOSARTAN POTASSIUM 100 MG PO TABS: ORAL_TABLET | ORAL | 3 refills | Status: DC

## 2021-02-25 NOTE — Telephone Encounter (Signed)
Rx sent 

## 2021-03-09 DIAGNOSIS — L565 Disseminated superficial actinic porokeratosis (DSAP): Secondary | ICD-10-CM | POA: Diagnosis not present

## 2021-03-09 DIAGNOSIS — I739 Peripheral vascular disease, unspecified: Secondary | ICD-10-CM | POA: Diagnosis not present

## 2021-03-21 ENCOUNTER — Encounter (HOSPITAL_COMMUNITY): Payer: Self-pay | Admitting: *Deleted

## 2021-03-21 ENCOUNTER — Other Ambulatory Visit: Payer: Self-pay

## 2021-03-21 ENCOUNTER — Ambulatory Visit (HOSPITAL_COMMUNITY)
Admission: EM | Admit: 2021-03-21 | Discharge: 2021-03-21 | Disposition: A | Discharge status: Home / Self Care | Payer: Medicare Other | Attending: Physician Assistant | Admitting: Physician Assistant

## 2021-03-21 DIAGNOSIS — J069 Acute upper respiratory infection, unspecified: Secondary | ICD-10-CM

## 2021-03-21 DIAGNOSIS — J029 Acute pharyngitis, unspecified: Secondary | ICD-10-CM | POA: Insufficient documentation

## 2021-03-21 DIAGNOSIS — E119 Type 2 diabetes mellitus without complications: Secondary | ICD-10-CM | POA: Insufficient documentation

## 2021-03-21 DIAGNOSIS — Z20822 Contact with and (suspected) exposure to covid-19: Secondary | ICD-10-CM | POA: Diagnosis not present

## 2021-03-21 DIAGNOSIS — I10 Essential (primary) hypertension: Secondary | ICD-10-CM | POA: Insufficient documentation

## 2021-03-21 DIAGNOSIS — R0981 Nasal congestion: Secondary | ICD-10-CM

## 2021-03-21 LAB — POCT RAPID STREP A, ED / UC: Streptococcus, Group A Screen (Direct): NEGATIVE

## 2021-03-21 LAB — SARS CORONAVIRUS 2 (TAT 6-24 HRS): SARS Coronavirus 2: NEGATIVE

## 2021-03-21 MED ORDER — BENZONATATE 100 MG PO CAPS: 100.0000 mg | ORAL_CAPSULE | Freq: Three times a day (TID) | ORAL | 0 refills | Status: DC

## 2021-03-21 NOTE — Medical Student Note (Incomplete)
[facility] Statistician Note For educational purposes for Medical, PA and NP students only and not part of the legal medical record.   CSN: [id number] Arrival date & time: 03/21/21  4650      History   Chief Complaint Chief Complaint  Patient presents with   Sore Throat   Fatigue    HPI Megan [name] is a 66 y.o. female.  Patient presents today with a cough x 4 days which has been dry and sometimes productive.Then, she developed a sore throat, nasal congestion, and sinus pain. Patient states she went out 4 days ago and is unsure of sick contact exposure. She took niquil and zyrtec with some relief. Admits to chest pain but pt thinks this is more musculoskeletal. Denies sob, jaw pain, arm pain ear pain, runny nose, or abd pain. Denies smoking but admits to seasonal allergies.    Sore Throat Associated symptoms include chest pain. Pertinent negatives include no abdominal pain and no shortness of breath.   Past Medical History:  Diagnosis Date   Allergy    Arthritis    fingers   Colon polyp    Diabetes mellitus    Diverticulosis    GERD (gastroesophageal reflux disease)    occasionally   H/O hiatal hernia    ?   Hemorrhoids    Hyperlipidemia    takes fish oil   Hypertension    Infectious colitis    Ventral hernia     Patient Active Problem List   Diagnosis Date Noted   Low back pain with radiation 12/21/2020   Preventative health care 12/21/2020   At risk for cardiovascular event 11/23/2020   Type 2 diabetes mellitus without complication, without long-term current use of insulin ([place]) 11/23/2020   Multinodular goiter 11/23/2020   Generalized abdominal pain 02/19/2020   History of hernia repair 02/19/2020   Hair loss 02/17/2020   DM (diabetes mellitus) type II uncontrolled, periph vascular disorder 02/17/2020   Diabetes mellitus without complication ([place]) 35/46/5681   Type 2 diabetes mellitus with hyperglycemia, without long-term current  use of insulin ([place]) 09/02/2019   Dyslipidemia 09/02/2019   Uncontrolled type 2 diabetes mellitus with hyperglycemia ([place]) 11/05/2018   Fatigue 11/05/2018   Acute non-recurrent pansinusitis 04/25/2018   Bronchitis 05/21/2017   CAP (community acquired pneumonia) 10/15/2013   Wheezing 09/29/2013   Obesity (BMI 30-39.9) 07/01/2013   Pain, knee 10/02/2012   Gastrocnemius muscle strain 10/02/2012   Wrist sprain 10/02/2012   Thyroid nodule 05/03/2012   Rash and nonspecific skin eruption 03/14/2012   HAIR LOSS 03/28/2010   COLONIC POLYPS, HX OF 11/09/2009   HEMANGIOMA, HEPATIC 11/04/2009   UNSPECIFIED ANEMIA 11/04/2009   DIVERTICULOSIS, COLON 11/04/2009   FATTY LIVER DISEASE 11/04/2009   PALPITATIONS 09/06/2009   Chronic fatigue 08/07/2008   Allergic rhinitis 04/10/2007   Diabetes mellitus type II, controlled ([place]) 12/21/2006   Hyperlipidemia 04/12/2006   Essential hypertension 04/12/2006    Past Surgical History:  Procedure Laterality Date   colonscopy     DIAGNOSTIC LAPAROSCOPY     FOOT SURGERY Bilateral    hammer toes   HEMORRHOID SURGERY     HERNIA REPAIR  01/20/2011   incisional   INCISIONAL HERNIA REPAIR  01/20/2011   Procedure: LAPAROSCOPIC INCISIONAL HERNIA;  Surgeon: Judieth Keens, DO;  Location: [facility];  Service: General;  Laterality: N/A;  Laparoscopic repair of a recurrent incisional hernia with Mesh   INGUINAL HERNIA REPAIR     x's 3  VENTRAL HERNIA REPAIR      OB History   No obstetric history on file.      Home Medications    Prior to Admission medications   Medication Sig Start Date End Date Taking? Authorizing Provider  ALPRAZolam Duanne Moron) 0.5 MG tablet Take 1 tablet (0.5 mg total) by mouth at bedtime as needed for anxiety. 11/23/20   Roma Schanz R, DO  amLODipine (NORVASC) 10 MG tablet TAKE 1 TABLET(10 MG) BY MOUTH DAILY 01/04/21   Roma Schanz R, DO  b complex vitamins tablet Take 1 tablet by mouth daily.    [provider]   Cholecalciferol (VITAMIN D3) 250 MCG (10000 UT) TABS Take by mouth daily.    [provider]  diazepam (VALIUM) 5 MG tablet Take 1 tablet (5 mg total) by mouth every 12 (twelve) hours as needed for anxiety. 10/29/20   [name], DO  Fish Oil-Cholecalciferol (FISH OIL + D3 PO) Take 1,200 mg by mouth daily.     [provider]  fluticasone (FLONASE) 50 MCG/ACT nasal spray Place 2 sprays into both nostrils daily. 04/25/18   [name], DO  gabapentin (NEURONTIN) 100 MG capsule Take 1 capsule (100 mg total) by mouth 2 (two) times daily. 10/29/20   [name], DO  glimepiride (AMARYL) 2 MG tablet Take 1 tablet (2 mg total) by mouth daily before breakfast. 06/29/20   Shamleffer, Melanie Crazier, MD  loratadine (CLARITIN) 10 MG tablet Take 1 tablet (10 mg total) by mouth daily. 08/09/18   [name], DO  losartan (COZAAR) 100 MG tablet TK 1 T PO D 02/25/21   Lowne Chase, Yvonne R, DO  montelukast (SINGULAIR) 10 MG tablet Take 1 tablet (10 mg total) by mouth at bedtime. 04/30/17   [name], DO  Multiple Vitamin (MULTIVITAMIN) tablet Take 1 tablet by mouth daily. OVER 50 VITAMIN    [provider]  rosuvastatin (CRESTOR) 5 MG tablet Take 1 tablet (5 mg total) by mouth daily. 09/09/20   Roma Schanz R, DO  sitaGLIPtin-metformin (JANUMET) 50-1000 MG tablet TAKE 1 TABLET BY MOUTH TWICE DAILY WITH A MEAL 06/29/20   Shamleffer, Melanie Crazier, MD  traZODone (DESYREL) 50 MG tablet Take 0.5-1 tablets (25-50 mg total) by mouth at bedtime as needed for sleep. 11/05/18   [name], DO    Family History Family History  Problem Relation Age of Onset   Breast cancer Mother    Diabetes Mother    Dementia Mother    Liver cancer Mother    Heart disease Father        heart attack   Diabetes Brother    Breast cancer Sister    Colon cancer Neg Hx    Stomach cancer Neg Hx     Social History Social History   Tobacco  Use   Smoking status: Former    Packs/day: 1.00    Years: 20.00    Pack years: 20.00    Types: Cigarettes    Quit date: 01/05/1996    Years since quitting: 25.2   Smokeless tobacco: Never  Vaping Use   Vaping Use: Never used  Substance Use Topics   Alcohol use: Yes    Alcohol/week: 4.0 standard drinks    Types: 4 Glasses of wine per week    Comment: occasional wine   Drug use: No     Allergies   Aspirin   Review of Systems  Review of Systems  HENT:  Positive for congestion, rhinorrhea, sinus pressure and sore throat. Negative for ear pain.   Respiratory:  Positive for cough. Negative for shortness of breath.   Cardiovascular:  Positive for chest pain.  Gastrointestinal:  Negative for abdominal pain.    Physical Exam Updated Vital Signs BP (!) 122/53    Pulse 92    Temp 99 F (37.2 C)    Resp 20    SpO2 98%   Physical Exam Constitutional:      Appearance: She is well-developed.  HENT:     Right Ear: Tympanic membrane and ear canal normal.     Left Ear: Tympanic membrane and ear canal normal.     Nose:     Right Sinus: No maxillary sinus tenderness or frontal sinus tenderness.     Left Sinus: No maxillary sinus tenderness or frontal sinus tenderness.     Mouth/Throat:     Tonsils: No tonsillar exudate or tonsillar abscesses.  Cardiovascular:     Rate and Rhythm: Normal rate and regular rhythm.  Pulmonary:     Effort: Pulmonary effort is normal.     Breath sounds: Normal breath sounds.  Chest:     Chest wall: No tenderness.  Lymphadenopathy:     Cervical: No cervical adenopathy.  Neurological:     Mental Status: She is alert.     ED Treatments / Results  Labs (all labs ordered are listed, but only abnormal results are displayed) Labs Reviewed - No data to display  EKG  Radiology No results found.  Procedures Procedures (including critical care time)  Medications Ordered in ED Medications - No data to display   Initial Impression / Assessment  and Plan / ED Course  I have reviewed the triage vital signs and the nursing notes.  Pertinent labs & imaging results that were available during my care of the patient were reviewed by me and considered in my medical decision making (see chart for details).     ***  Final Clinical Impressions(s) / ED Diagnoses   Final diagnoses:  None    New Prescriptions New Prescriptions   No medications on file

## 2021-03-21 NOTE — ED Triage Notes (Signed)
Pt reports sore throat and fatigue since SAt. ?

## 2021-03-21 NOTE — ED Provider Notes (Signed)
[place]    CSN: [id number] Arrival date & time: 03/21/21  4097      History   Chief Complaint Chief Complaint  Patient presents with   Sore Throat   Fatigue    HPI Megan [name] is a 66 y.o. female.   Patient presents today with a 3-day history of URI symptoms including nasal congestion and sore throat.  She denies any known sick contacts but does work at [employer] and is exposed to many people.  She reports having ongoing congestion for the past several weeks which she attributed to allergies but this is worsened recently.  She is taking Zyrtec, Flonase, montelukast without improvement of symptoms.  She is also tried NyQuil and decongestants without improvement.  She has a history of diabetes as well as high blood pressure but denies additional medical history including malignancy, immunosuppression, chronic liver/kidney disease, COPD, smoking.  She has not had COVID in the past.  She does get checked once a month with last check approximately 02/21/2021 that was negative.  She has had COVID vaccines and one booster.  Denies any recent antibiotic use.   Past Medical History:  Diagnosis Date   Allergy    Arthritis    fingers   Colon polyp    Diabetes mellitus    Diverticulosis    GERD (gastroesophageal reflux disease)    occasionally   H/O hiatal hernia    ?   Hemorrhoids    Hyperlipidemia    takes fish oil   Hypertension    Infectious colitis    Ventral hernia     Patient Active Problem List   Diagnosis Date Noted   Low back pain with radiation 12/21/2020   Preventative health care 12/21/2020   At risk for cardiovascular event 11/23/2020   Type 2 diabetes mellitus without complication, without long-term current use of insulin ([place]) 11/23/2020   Multinodular goiter 11/23/2020   Generalized abdominal pain 02/19/2020   History of hernia repair 02/19/2020   Hair loss 02/17/2020   DM (diabetes mellitus) type II uncontrolled, periph vascular  disorder 02/17/2020   Diabetes mellitus without complication ([place]) 35/32/9924   Type 2 diabetes mellitus with hyperglycemia, without long-term current use of insulin ([place]) 09/02/2019   Dyslipidemia 09/02/2019   Uncontrolled type 2 diabetes mellitus with hyperglycemia ([place]) 11/05/2018   Fatigue 11/05/2018   Acute non-recurrent pansinusitis 04/25/2018   Bronchitis 05/21/2017   CAP (community acquired pneumonia) 10/15/2013   Wheezing 09/29/2013   Obesity (BMI 30-39.9) 07/01/2013   Pain, knee 10/02/2012   Gastrocnemius muscle strain 10/02/2012   Wrist sprain 10/02/2012   Thyroid nodule 05/03/2012   Rash and nonspecific skin eruption 03/14/2012   HAIR LOSS 03/28/2010   COLONIC POLYPS, HX OF 11/09/2009   HEMANGIOMA, HEPATIC 11/04/2009   UNSPECIFIED ANEMIA 11/04/2009   DIVERTICULOSIS, COLON 11/04/2009   FATTY LIVER DISEASE 11/04/2009   PALPITATIONS 09/06/2009   Chronic fatigue 08/07/2008   Allergic rhinitis 04/10/2007   Diabetes mellitus type II, controlled ([place]) 12/21/2006   Hyperlipidemia 04/12/2006   Essential hypertension 04/12/2006    Past Surgical History:  Procedure Laterality Date   colonscopy     DIAGNOSTIC LAPAROSCOPY     FOOT SURGERY Bilateral    hammer toes   HEMORRHOID SURGERY     HERNIA REPAIR  01/20/2011   incisional   INCISIONAL HERNIA REPAIR  01/20/2011   Procedure: LAPAROSCOPIC INCISIONAL HERNIA;  Surgeon: Judieth Keens, DO;  Location: [facility];  Service: General;  Laterality: N/A;  Laparoscopic repair of a recurrent incisional hernia with Mesh   INGUINAL HERNIA REPAIR     x's 3   VENTRAL HERNIA REPAIR      OB History   No obstetric history on file.      Home Medications    Prior to Admission medications   Medication Sig Start Date End Date Taking? Authorizing Provider  benzonatate (TESSALON) 100 MG capsule Take 1 capsule (100 mg total) by mouth every 8 (eight) hours. 03/21/21  Yes Masaji Billups, Derry Skill, PA-C  ALPRAZolam (XANAX) 0.5 MG tablet Take 1 tablet  (0.5 mg total) by mouth at bedtime as needed for anxiety. 11/23/20   Roma Schanz R, DO  amLODipine (NORVASC) 10 MG tablet TAKE 1 TABLET(10 MG) BY MOUTH DAILY 01/04/21   Roma Schanz R, DO  b complex vitamins tablet Take 1 tablet by mouth daily.    [provider]  Cholecalciferol (VITAMIN D3) 250 MCG (10000 UT) TABS Take by mouth daily.    [provider]  diazepam (VALIUM) 5 MG tablet Take 1 tablet (5 mg total) by mouth every 12 (twelve) hours as needed for anxiety. 10/29/20   [name], DO  Fish Oil-Cholecalciferol (FISH OIL + D3 PO) Take 1,200 mg by mouth daily.     [provider]  fluticasone (FLONASE) 50 MCG/ACT nasal spray Place 2 sprays into both nostrils daily. 04/25/18   [name], DO  gabapentin (NEURONTIN) 100 MG capsule Take 1 capsule (100 mg total) by mouth 2 (two) times daily. 10/29/20   [name], DO  glimepiride (AMARYL) 2 MG tablet Take 1 tablet (2 mg total) by mouth daily before breakfast. 06/29/20   Shamleffer, Melanie Crazier, MD  loratadine (CLARITIN) 10 MG tablet Take 1 tablet (10 mg total) by mouth daily. 08/09/18   [name], DO  losartan (COZAAR) 100 MG tablet TK 1 T PO D 02/25/21   Lowne Chase, Yvonne R, DO  montelukast (SINGULAIR) 10 MG tablet Take 1 tablet (10 mg total) by mouth at bedtime. 04/30/17   [name], DO  Multiple Vitamin (MULTIVITAMIN) tablet Take 1 tablet by mouth daily. OVER 50 VITAMIN    [provider]  rosuvastatin (CRESTOR) 5 MG tablet Take 1 tablet (5 mg total) by mouth daily. 09/09/20   Roma Schanz R, DO  sitaGLIPtin-metformin (JANUMET) 50-1000 MG tablet TAKE 1 TABLET BY MOUTH TWICE DAILY WITH A MEAL 06/29/20   Shamleffer, Melanie Crazier, MD  traZODone (DESYREL) 50 MG tablet Take 0.5-1 tablets (25-50 mg total) by mouth at bedtime as needed for sleep. 11/05/18   [name], DO    Family History Family History  Problem Relation  Age of Onset   Breast cancer Mother    Diabetes Mother    Dementia Mother    Liver cancer Mother    Heart disease Father        heart attack   Diabetes Brother    Breast cancer Sister    Colon cancer Neg Hx    Stomach cancer Neg Hx     Social History Social History   Tobacco Use   Smoking status: Former    Packs/day: 1.00    Years: 20.00    Pack years: 20.00    Types: Cigarettes    Quit date: 01/05/1996    Years since quitting: 25.2   Smokeless tobacco: Never  Vaping Use   Vaping Use: Never used  Substance Use  Topics   Alcohol use: Yes    Alcohol/week: 4.0 standard drinks    Types: 4 Glasses of wine per week    Comment: occasional wine   Drug use: No     Allergies   Aspirin   Review of Systems Review of Systems  Constitutional:  Positive for activity change. Negative for appetite change, fatigue and fever.  HENT:  Positive for congestion, postnasal drip and sore throat. Negative for sinus pressure and sneezing.   Respiratory:  Positive for cough. Negative for shortness of breath.   Cardiovascular:  Negative for chest pain.  Gastrointestinal:  Negative for abdominal pain, diarrhea, nausea and vomiting.  Neurological:  Negative for dizziness, light-headedness and headaches.    Physical Exam Triage Vital Signs ED Triage Vitals  Enc Vitals Group     BP 03/21/21 1055 (!) 122/53     Pulse Rate 03/21/21 1055 92     Resp 03/21/21 1055 20     Temp 03/21/21 1055 99 F (37.2 C)     Temp src --      SpO2 03/21/21 1055 98 %     Weight --      Height --      Head Circumference --      Peak Flow --      Pain Score 03/21/21 1052 6     Pain Loc --      Pain Edu? --      Excl. in [place]? --    No data found.  Updated Vital Signs BP (!) 122/53    Pulse 92    Temp 99 F (37.2 C)    Resp 20    SpO2 98%   Visual Acuity Right Eye Distance:   Left Eye Distance:   Bilateral Distance:    Right Eye Near:   Left Eye Near:    Bilateral Near:     Physical  Exam Vitals reviewed.  Constitutional:      General: She is awake. She is not in acute distress.    Appearance: Normal appearance. She is well-developed. She is not ill-appearing.     Comments: Very pleasant female appears stated age in no acute distress sitting comfortably in exam room  HENT:     Head: Normocephalic and atraumatic.     Right Ear: Tympanic membrane, ear canal and external ear normal. Tympanic membrane is not erythematous or bulging.     Left Ear: Tympanic membrane, ear canal and external ear normal. Tympanic membrane is not erythematous or bulging.     Nose:     Right Sinus: Maxillary sinus tenderness present. No frontal sinus tenderness.     Left Sinus: Maxillary sinus tenderness present. No frontal sinus tenderness.     Mouth/Throat:     Pharynx: Uvula midline. Posterior oropharyngeal erythema present. No oropharyngeal exudate.  Cardiovascular:     Rate and Rhythm: Normal rate and regular rhythm.     Heart sounds: Normal heart sounds, S1 normal and S2 normal. No murmur heard. Pulmonary:     Effort: Pulmonary effort is normal.     Breath sounds: Normal breath sounds. No wheezing, rhonchi or rales.     Comments: Clear to auscultation bilaterally Musculoskeletal:     Right lower leg: No edema.     Left lower leg: No edema.  Psychiatric:        Behavior: Behavior is cooperative.     UC Treatments / Results  Labs (all labs ordered are listed, but only abnormal results are  displayed) Labs Reviewed  SARS CORONAVIRUS 2 (TAT 6-24 HRS)  POCT RAPID STREP A, ED / UC    EKG   Radiology No results found.  Procedures Procedures (including critical care time)  Medications Ordered in UC Medications - No data to display  Initial Impression / Assessment and Plan / UC Course  I have reviewed the triage vital signs and the nursing notes.  Pertinent labs & imaging results that were available during my care of the patient were reviewed by me and considered in my  medical decision making (see chart for details).     Patient is well-appearing, nontoxic, afebrile, nontachycardic.  Strep testing was obtained given severity of sore throat and negative.  Discussed likely viral etiology of symptoms and COVID testing was obtained.  She was provided work excuse note with current CDC return to work guidelines based on COVID test result.  No evidence of acute infection on physical exam that would warrant initiation of antibiotics.  She was encouraged to continue allergy medication as previously prescribed.  She was given Tessalon for cough.  Recommended Mucinex and Tylenol for symptom relief.  She is to rest and drink plenty of fluid.  Discussed that if symptoms are not improving or if at any point anything worsens she needs to be reevaluated.  Strict return precautions given to which she expressed understanding.  Final Clinical Impressions(s) / UC Diagnoses   Final diagnoses:  Upper respiratory tract infection, unspecified type  Sore throat  Nasal congestion     Discharge Instructions      Your strep test was negative.  We will contact you if your COVID test is positive.  Please continue your allergy medication including Flonase and Zyrtec.  I also recommend Tylenol and Mucinex for additional symptom relief.  Make sure you are resting and drinking plenty of fluid.  You can use Tessalon for cough.  If your symptoms or not improving within a week or if at any point anything worsens you need to be reevaluated.  If you have any severe symptoms including high fever, chest pain, shortness of breath, nausea/vomiting interfering with oral intake you need to go to the emergency room.     ED Prescriptions     Medication Sig Dispense Auth. Provider   benzonatate (TESSALON) 100 MG capsule Take 1 capsule (100 mg total) by mouth every 8 (eight) hours. 21 capsule Verlaine Embry K, PA-C      PDMP not reviewed this encounter.   Terrilee Croak, PA-C 03/21/21 1224

## 2021-03-21 NOTE — Discharge Instructions (Signed)
Your strep test was negative.  We will contact you if your COVID test is positive.  Please continue your allergy medication including Flonase and Zyrtec.  I also recommend Tylenol and Mucinex for additional symptom relief.  Make sure you are resting and drinking plenty of fluid.  You can use Tessalon for cough.  If your symptoms or not improving within a week or if at any point anything worsens you need to be reevaluated.  If you have any severe symptoms including high fever, chest pain, shortness of breath, nausea/vomiting interfering with oral intake you need to go to the emergency room. ?

## 2021-04-07 DIAGNOSIS — L565 Disseminated superficial actinic porokeratosis (DSAP): Secondary | ICD-10-CM | POA: Diagnosis not present

## 2021-04-07 DIAGNOSIS — I739 Peripheral vascular disease, unspecified: Secondary | ICD-10-CM | POA: Diagnosis not present

## 2021-04-15 DIAGNOSIS — E1122 Type 2 diabetes mellitus with diabetic chronic kidney disease: Secondary | ICD-10-CM | POA: Diagnosis not present

## 2021-04-15 DIAGNOSIS — N181 Chronic kidney disease, stage 1: Secondary | ICD-10-CM | POA: Diagnosis not present

## 2021-04-15 DIAGNOSIS — R3129 Other microscopic hematuria: Secondary | ICD-10-CM | POA: Diagnosis not present

## 2021-04-15 DIAGNOSIS — I129 Hypertensive chronic kidney disease with stage 1 through stage 4 chronic kidney disease, or unspecified chronic kidney disease: Secondary | ICD-10-CM | POA: Diagnosis not present

## 2021-04-15 DIAGNOSIS — R809 Proteinuria, unspecified: Secondary | ICD-10-CM | POA: Diagnosis not present

## 2021-04-15 DIAGNOSIS — E1129 Type 2 diabetes mellitus with other diabetic kidney complication: Secondary | ICD-10-CM | POA: Diagnosis not present

## 2021-04-15 LAB — BASIC METABOLIC PANEL
BUN: 11 (ref 4–21)
CO2: 28 — AB (ref 13–22)
Chloride: 104 (ref 99–108)
Creatinine: 0.7 (ref 0.5–1.1)
Glucose: 107
Potassium: 4.4 mEq/L (ref 3.5–5.1)
Sodium: 139 (ref 137–147)

## 2021-04-15 LAB — CBC AND DIFFERENTIAL
HCT: 36 (ref 36–46)
Hemoglobin: 12 (ref 12.0–16.0)
Neutrophils Absolute: 4.8
Platelets: 403 10*3/uL — AB (ref 150–400)
WBC: 8.3

## 2021-04-15 LAB — COMPREHENSIVE METABOLIC PANEL
Albumin: 4.3 (ref 3.5–5.0)
Calcium: 9.9 (ref 8.7–10.7)
eGFR: 90

## 2021-04-15 LAB — MICROALBUMIN / CREATININE URINE RATIO: Microalb Creat Ratio: 114

## 2021-04-15 LAB — CBC: RBC: 5.57 — AB (ref 3.87–5.11)

## 2021-04-18 LAB — HEMOGLOBIN A1C: Hemoglobin A1C: 6.3

## 2021-04-20 ENCOUNTER — Encounter: Payer: Self-pay | Admitting: *Deleted

## 2021-04-21 DIAGNOSIS — L565 Disseminated superficial actinic porokeratosis (DSAP): Secondary | ICD-10-CM | POA: Diagnosis not present

## 2021-04-21 DIAGNOSIS — I739 Peripheral vascular disease, unspecified: Secondary | ICD-10-CM | POA: Diagnosis not present

## 2021-04-24 ENCOUNTER — Other Ambulatory Visit: Payer: Self-pay | Admitting: Internal Medicine

## 2021-05-02 ENCOUNTER — Encounter: Payer: Medicare Other | Admitting: Family Medicine

## 2021-05-12 ENCOUNTER — Other Ambulatory Visit: Payer: Self-pay | Admitting: Family Medicine

## 2021-05-12 DIAGNOSIS — R0981 Nasal congestion: Secondary | ICD-10-CM

## 2021-05-23 DIAGNOSIS — M5416 Radiculopathy, lumbar region: Secondary | ICD-10-CM | POA: Diagnosis not present

## 2021-05-24 ENCOUNTER — Ambulatory Visit: Payer: Medicare Other | Admitting: Internal Medicine

## 2021-05-24 ENCOUNTER — Encounter: Payer: Self-pay | Admitting: Internal Medicine

## 2021-05-24 VITALS — BP 128/70 | HR 84 | Ht 65.0 in | Wt 174.0 lb

## 2021-05-24 DIAGNOSIS — E119 Type 2 diabetes mellitus without complications: Secondary | ICD-10-CM | POA: Diagnosis not present

## 2021-05-24 DIAGNOSIS — E042 Nontoxic multinodular goiter: Secondary | ICD-10-CM

## 2021-05-24 MED ORDER — JANUMET 50-1000 MG PO TABS: 1.0000 | ORAL_TABLET | Freq: Two times a day (BID) | ORAL | 3 refills | Status: DC

## 2021-05-24 MED ORDER — GLIMEPIRIDE 1 MG PO TABS: 1.0000 mg | ORAL_TABLET | Freq: Every day | ORAL | 3 refills | Status: DC

## 2021-05-24 NOTE — Patient Instructions (Addendum)
?-   Decrease  Glimepiride 1 mg, one  tablet before Breakfast  ?- Continue Janumet 50-1000 mg twice daily  ? ? ? ? ?HOW TO TREAT LOW BLOOD SUGARS (Blood sugar LESS THAN 70 MG/DL) ?Please follow the RULE OF 15 for the treatment of hypoglycemia treatment (when your (blood sugars are less than 70 mg/dL)  ? ?STEP 1: Take 15 grams of carbohydrates when your blood sugar is low, which includes:  ?3-4 GLUCOSE TABS  OR ?3-4 OZ OF JUICE OR REGULAR SODA OR ?ONE TUBE OF GLUCOSE GEL   ? ?STEP 2: RECHECK blood sugar in 15 MINUTES ?STEP 3: If your blood sugar is still low at the 15 minute recheck --> then, go back to STEP 1 and treat AGAIN with another 15 grams of carbohydrates. ? ?

## 2021-05-24 NOTE — Progress Notes (Signed)
?   ?Name: Megan [name]  ?Age/ Sex: 66 y.o., female   ?MRN/ DOB: [id number], 05-30-1955    ? ?PCP: [name], DO   ?Reason for Endocrinology Evaluation: Type 2 Diabetes Mellitus  ?Initial Endocrine Consultative Visit: 09/02/2019  ? ? ?PATIENT IDENTIFIER: Megan [name] is a 66 y.o. female with a past medical history of T2DM, HTN and Dyslipidemia. The patient has followed with Endocrinology clinic since 09/02/2019 for consultative assistance with management of her diabetes. ? ?DIABETIC HISTORY:  ?Megan [name] was diagnosed with DM in 2008.She has been on Metformin, Glimepiride, repaglinide and Januvia . Her hemoglobin A1c has ranged from 6.5% in 2014, peaking at 7.4% in 10/2018 ? ?On her initial visit to our clinic she has an A1c of 7.3% . She was on Repaglinide which we stopped due to hypoglycemia, Decreased Glimepiride and continued Janumet.  ? ?SUBJECTIVE:  ? ?During the last visit (11/23/2020): A1c 6.4 % We continued glimepiride  and Continued Janumet  ? ? ? ? ?Today (05/24/2021): Megan [name] is here for a follow up on diabetes management.  She checks her blood sugars 1 times daily. The patient has not had hypoglycemic episodes since the last clinic visit. ? ? ?Weight continues to fluctuate  ?Denies nausea , vomiting or diarrhea  ?She recently has seen her nephrologist  ?This am had morning back pain  ?Denies locak neck swelling  ? ?HOME DIABETES REGIMEN:  ?Janumet 50-1000 mg BID ?Glimepiride 2 mg , 1 tabs daily  ? ? ? ?Statin: Declines  ?ACE-I/ARB: yes ? ? ? ?METER DOWNLOAD SUMMARY: Unable to download  ?98 - 150 ? ?DIABETIC COMPLICATIONS: ?Microvascular complications:  ? ?Denies: CKD, retinopathy, neuropathy ?Last Eye Exam: Completed 07/15/2020 ? ? ? ?Macrovascular complications:  ? ?Denies: CAD, CVA, PVD ? ? ?HISTORY:  ?Past Medical History:  ?Past Medical History:  ?Diagnosis Date  ? Allergy   ? Arthritis   ? fingers  ? Colon polyp   ? Diabetes mellitus   ? Diverticulosis   ? GERD  (gastroesophageal reflux disease)   ? occasionally  ? H/O hiatal hernia   ? ?  ? Hemorrhoids   ? Hyperlipidemia   ? takes fish oil  ? Hypertension   ? Infectious colitis   ? Ventral hernia   ? ?Past Surgical History:  ?Past Surgical History:  ?Procedure Laterality Date  ? colonscopy    ? DIAGNOSTIC LAPAROSCOPY    ? FOOT SURGERY Bilateral   ? hammer toes  ? HEMORRHOID SURGERY    ? HERNIA REPAIR  01/20/2011  ? incisional  ? INCISIONAL HERNIA REPAIR  01/20/2011  ? Procedure: LAPAROSCOPIC INCISIONAL HERNIA;  Surgeon: Judieth Keens, DO;  Location: [facility];  Service: General;  Laterality: N/A;  Laparoscopic repair of a recurrent incisional hernia with Mesh  ? INGUINAL HERNIA REPAIR    ? x's 3  ? VENTRAL HERNIA REPAIR    ? ?Social History:  reports that she quit smoking about 25 years ago. Her smoking use included cigarettes. She has a 20.00 pack-year smoking history. She has never used smokeless tobacco. She reports current alcohol use of about 4.0 standard drinks per week. She reports that she does not use drugs. ?Family History:  ?Family History  ?Problem Relation Age of Onset  ? Breast cancer Mother   ? Diabetes Mother   ? Dementia Mother   ? Liver cancer Mother   ? Heart disease Father   ?     heart attack  ?  Diabetes Brother   ? Breast cancer Sister   ? Colon cancer Neg Hx   ? Stomach cancer Neg Hx   ? ? ? ?HOME MEDICATIONS: ?Allergies as of 05/24/2021   ? ?   Reactions  ? Aspirin Nausea Only  ? ?  ? ?  ?Medication List  ?  ? ?  ? Accurate as of May 24, 2021  8:17 AM. If you have any questions, ask your nurse or doctor.  ?  ?  ? ?  ? ?STOP taking these medications   ? ?benzonatate 100 MG capsule ?Commonly known as: TESSALON ?  ?traZODone 50 MG tablet ?Commonly known as: DESYREL ?  ? ?  ? ?TAKE these medications   ? ?ALPRAZolam 0.5 MG tablet ?Commonly known as: Xanax ?Take 1 tablet (0.5 mg total) by mouth at bedtime as needed for anxiety. ?  ?amLODipine 10 MG tablet ?Commonly known as: NORVASC ?TAKE 1 TABLET(10 MG) BY  MOUTH DAILY ?  ?b complex vitamins tablet ?Take 1 tablet by mouth daily. ?  ?diazepam 5 MG tablet ?Commonly known as: VALIUM ?Take 1 tablet (5 mg total) by mouth every 12 (twelve) hours as needed for anxiety. ?  ?FISH OIL + D3 PO ?Take 1,200 mg by mouth daily. ?  ?fluticasone 50 MCG/ACT nasal spray ?Commonly known as: FLONASE ?SHAKE LIQUID AND USE 2 SPRAYS IN EACH NOSTRIL DAILY ?  ?gabapentin 100 MG capsule ?Commonly known as: NEURONTIN ?Take 1 capsule (100 mg total) by mouth 2 (two) times daily. ?  ?glimepiride 1 MG tablet ?Commonly known as: AMARYL ?Take 1 tablet (1 mg total) by mouth daily with breakfast. ?What changed:  ?medication strength ?how much to take ?when to take this ?  ?Janumet 50-1000 MG tablet ?Generic drug: sitaGLIPtin-metformin ?TAKE 1 TABLET BY MOUTH TWICE DAILY WITH A MEAL ?  ?loratadine 10 MG tablet ?Commonly known as: CLARITIN ?Take 1 tablet (10 mg total) by mouth daily. ?  ?losartan 100 MG tablet ?Commonly known as: COZAAR ?TK 1 T PO D ?  ?montelukast 10 MG tablet ?Commonly known as: SINGULAIR ?Take 1 tablet (10 mg total) by mouth at bedtime. ?  ?multivitamin tablet ?Take 1 tablet by mouth daily. OVER 50 VITAMIN ?  ?rosuvastatin 5 MG tablet ?Commonly known as: Crestor ?Take 1 tablet (5 mg total) by mouth daily. ?  ?Vitamin D3 250 MCG (10000 UT) Tabs ?Take by mouth daily. ?  ? ?  ? ? ? ?OBJECTIVE:  ? ?Vital Signs: BP 128/70 (BP Location: Left Arm, Patient Position: Sitting, Cuff Size: Small)   Pulse 84   Ht '5\' 5"'$  (1.651 m)   Wt 174 lb (78.9 kg)   SpO2 95%   BMI 28.96 kg/m?   ?Wt Readings from Last 3 Encounters:  ?05/24/21 174 lb (78.9 kg)  ?12/21/20 174 lb 12.8 oz (79.3 kg)  ?11/23/20 175 lb 6.4 oz (79.6 kg)  ? ? ? ?Exam: ?General: Pt appears well and is in NAD  ?Neck: General: Supple without adenopathy. ?Thyroid: Thyroid size normal.  No goiter or nodules appreciated. No thyroid bruit.  ?Lungs: Clear with good BS bilat with no rales, rhonchi, or wheezes  ?Heart: RRR   ?Abdomen:  Normoactive bowel sounds, soft, nontender, without masses or organomegaly palpable  ?Extremities: No pretibial edema.  ?Neuro: MS is good with appropriate affect, pt is alert and Ox3  ? ? ?DM foot exam  05/24/2021 ?The skin of the feet is intact without sores or ulcerations. ?The pedal pulses are 2+ on right  and 2+ on left. ?The sensation is intact to a screening 5.07, 10 gram monofilament bilaterally ?  ? ? ?DATA REVIEWED: ? ?Lab Results  ?Component Value Date  ? HGBA1C 6.3 04/15/2021  ? HGBA1C 6.4 10/29/2020  ? HGBA1C 6.6 (H) 06/17/2020  ? ?Lab Results  ?Component Value Date  ? MICROALBUR 4.1 (H) 10/29/2020  ? [place] 82 10/29/2020  ? CREATININE 0.7 04/15/2021  ? ?Lab Results  ?Component Value Date  ? MICRALBCREAT 114 04/15/2021  ? ? ? ?Lab Results  ?Component Value Date  ? CHOL 171 10/29/2020  ? HDL 56.70 10/29/2020  ? [place] 82 10/29/2020  ? LDLDIRECT 88.0 04/21/2014  ? TRIG 159.0 (H) 10/29/2020  ? CHOLHDL 3 10/29/2020  ?     ? ? ?Right Nodule FNA 05/16/2012 ? ?THYROID, FINE NEEDLE ASPIRATION, RIGHT ?BENIGN. ?FINDINGS CONSISTENT WITH NON-NEOPLASTIC GOITER. ? ? ?ASSESSMENT / PLAN / RECOMMENDATIONS:  ? ?1) Type 2 Diabetes Mellitus, Optimally controlled, Without complications - Most recent A1c of 6.3 %. Goal A1c < 7.0 %.   ? ?- A1c is optimal  without hypoglycemia.  ?- Her nephrologist recommended trying Ozempic, her A1c is optimal, She understands this will replace Januvia in the [place], we opted to continue on current regimen since its working for her  ?- Will reduce Glimepiride as below  ?  ? ? ?MEDICATIONS: ?- Decrease  Glimepiride 1 mg, one  tablet before Breakfast  ?- Continue Janumet 50-1000 mg twice daily  ? ?EDUCATION / INSTRUCTIONS: ?BG monitoring instructions: Patient is instructed to check her blood sugars 1 times a day, fasting . ?Call [facility] if: BG persistently < 70 ?I reviewed the Rule of 15 for the treatment of hypoglycemia in detail with the patient. Literature supplied. ? ? ?2)  Diabetic complications:  ?Eye: Does not have known diabetic retinopathy.  ?Neuro/ Feet: Does not have known diabetic peripheral neuropathy .  ?Renal: Patient does not have known baseline CKD. She   is on an ACEI/ARB at pres

## 2021-06-17 DIAGNOSIS — M25561 Pain in right knee: Secondary | ICD-10-CM | POA: Insufficient documentation

## 2021-06-20 ENCOUNTER — Telehealth: Payer: Self-pay | Admitting: Family Medicine

## 2021-06-20 NOTE — Telephone Encounter (Signed)
Pt called stating that she had a recent visit with ortho about some issues that led to her being prescribed meloxicam. Pt was advised that this medication may affect her BP. Pt wants to go over this with her PCP when possible. Ok to [facility].

## 2021-06-21 NOTE — Telephone Encounter (Signed)
Spoke with patient. Pt verbalized understanding. Pt will check blood pressure before medication and after

## 2021-06-21 NOTE — Telephone Encounter (Signed)
Pt is worried and would like to speak with someone regarding side effects.

## 2021-06-24 LAB — HM DIABETES EYE EXAM

## 2021-06-28 DIAGNOSIS — E1151 Type 2 diabetes mellitus with diabetic peripheral angiopathy without gangrene: Secondary | ICD-10-CM | POA: Diagnosis not present

## 2021-06-28 DIAGNOSIS — M21962 Unspecified acquired deformity of left lower leg: Secondary | ICD-10-CM | POA: Diagnosis not present

## 2021-06-28 DIAGNOSIS — M19072 Primary osteoarthritis, left ankle and foot: Secondary | ICD-10-CM | POA: Diagnosis not present

## 2021-06-28 DIAGNOSIS — M21961 Unspecified acquired deformity of right lower leg: Secondary | ICD-10-CM | POA: Diagnosis not present

## 2021-06-28 DIAGNOSIS — D2371 Other benign neoplasm of skin of right lower limb, including hip: Secondary | ICD-10-CM | POA: Diagnosis not present

## 2021-06-28 DIAGNOSIS — M19071 Primary osteoarthritis, right ankle and foot: Secondary | ICD-10-CM | POA: Diagnosis not present

## 2021-07-01 DIAGNOSIS — M25561 Pain in right knee: Secondary | ICD-10-CM | POA: Diagnosis not present

## 2021-07-06 ENCOUNTER — Other Ambulatory Visit: Payer: Self-pay | Admitting: Family Medicine

## 2021-07-06 DIAGNOSIS — Z1231 Encounter for screening mammogram for malignant neoplasm of breast: Secondary | ICD-10-CM | POA: Diagnosis not present

## 2021-07-06 LAB — HM MAMMOGRAPHY

## 2021-07-08 ENCOUNTER — Telehealth: Payer: Self-pay | Admitting: Family Medicine

## 2021-07-08 ENCOUNTER — Ambulatory Visit (INDEPENDENT_AMBULATORY_CARE_PROVIDER_SITE_OTHER): Payer: Medicare Other | Admitting: Family

## 2021-07-08 ENCOUNTER — Encounter: Payer: Self-pay | Admitting: Family

## 2021-07-08 VITALS — BP 128/70 | HR 88 | Temp 98.5°F | Wt 173.0 lb

## 2021-07-08 DIAGNOSIS — L255 Unspecified contact dermatitis due to plants, except food: Secondary | ICD-10-CM | POA: Diagnosis not present

## 2021-07-08 MED ORDER — METHYLPREDNISOLONE ACETATE 40 MG/ML IJ SUSP
40.0000 mg | Freq: Once | INTRAMUSCULAR | Status: AC
  Administered 2021-07-08: 40 mg via INTRAMUSCULAR

## 2021-07-08 MED ORDER — TRIAMCINOLONE ACETONIDE 0.1 % EX CREA
1.0000 | TOPICAL_CREAM | Freq: Two times a day (BID) | CUTANEOUS | 0 refills | Status: DC
Start: 2021-07-08 — End: 2021-12-23

## 2021-07-11 ENCOUNTER — Ambulatory Visit: Payer: Medicare Other | Admitting: Internal Medicine

## 2021-08-09 ENCOUNTER — Ambulatory Visit (INDEPENDENT_AMBULATORY_CARE_PROVIDER_SITE_OTHER): Payer: Medicare Other | Admitting: Family Medicine

## 2021-08-09 ENCOUNTER — Encounter: Payer: Self-pay | Admitting: Family Medicine

## 2021-08-09 VITALS — BP 122/60 | HR 83 | Temp 98.8°F | Ht 65.0 in | Wt 172.2 lb

## 2021-08-09 DIAGNOSIS — I10 Essential (primary) hypertension: Secondary | ICD-10-CM

## 2021-08-09 DIAGNOSIS — J069 Acute upper respiratory infection, unspecified: Secondary | ICD-10-CM

## 2021-08-09 DIAGNOSIS — E1165 Type 2 diabetes mellitus with hyperglycemia: Secondary | ICD-10-CM

## 2021-08-09 DIAGNOSIS — E118 Type 2 diabetes mellitus with unspecified complications: Secondary | ICD-10-CM

## 2021-08-09 DIAGNOSIS — J301 Allergic rhinitis due to pollen: Secondary | ICD-10-CM | POA: Diagnosis not present

## 2021-08-09 DIAGNOSIS — R059 Cough, unspecified: Secondary | ICD-10-CM

## 2021-08-09 LAB — POCT GLYCOSYLATED HEMOGLOBIN (HGB A1C): Hemoglobin A1C: 6.2 % — AB (ref 4.0–5.6)

## 2021-08-09 MED ORDER — LORATADINE 10 MG PO TABS: 10.0000 mg | ORAL_TABLET | Freq: Every day | ORAL | 11 refills | Status: DC

## 2021-08-09 NOTE — Assessment & Plan Note (Signed)
Well controlled on

## 2021-08-09 NOTE — Progress Notes (Unsigned)
New Patient Office Visit  Subjective    Patient ID: Megan [name], female    DOB: Sep 28, 1955  Age: 66 y.o. MRN: [id number]  CC:  Chief Complaint  Patient presents with   Establish Care    HPI ALBERT DEVAUL presents to establish care. She was a former patient of the [facility] office. No new symptoms or complaints today. We reviewed her medications at length and her chronic medical problems, both active and past. Reviewed surgical history as well.   We reviewed her Preventative screenings and immunizations as well. She did receive pneumonia vaccines in 2015 and 2020, she will be due for the Prevnar 20 in 2025. Otherwise she appears to be up to date.       Outpatient Encounter Medications as of 08/09/2021  Medication Sig   ALPRAZolam (XANAX) 0.5 MG tablet Take 1 tablet (0.5 mg total) by mouth at bedtime as needed for anxiety.   amLODipine (NORVASC) 10 MG tablet TAKE 1 TABLET(10 MG) BY MOUTH DAILY   b complex vitamins tablet Take 1 tablet by mouth daily.   Cholecalciferol (VITAMIN D3) 250 MCG (10000 UT) TABS Take by mouth daily.   diazepam (VALIUM) 5 MG tablet Take 1 tablet (5 mg total) by mouth every 12 (twelve) hours as needed for anxiety.   Fish Oil-Cholecalciferol (FISH OIL + D3 PO) Take 1,200 mg by mouth daily.    fluticasone (FLONASE) 50 MCG/ACT nasal spray SHAKE LIQUID AND USE 2 SPRAYS IN EACH NOSTRIL DAILY   gabapentin (NEURONTIN) 100 MG capsule Take 1 capsule (100 mg total) by mouth 2 (two) times daily.   glimepiride (AMARYL) 1 MG tablet Take 1 tablet (1 mg total) by mouth daily with breakfast.   losartan (COZAAR) 100 MG tablet TK 1 T PO D   montelukast (SINGULAIR) 10 MG tablet Take 1 tablet (10 mg total) by mouth at bedtime.   Multiple Vitamin (MULTIVITAMIN) tablet Take 1 tablet by mouth daily. OVER 50 VITAMIN   rosuvastatin (CRESTOR) 5 MG tablet Take 1 tablet (5 mg total) by mouth daily.   sitaGLIPtin-metformin (JANUMET) 50-1000 MG tablet Take 1 tablet by mouth 2  (two) times daily with a meal.   triamcinolone cream (KENALOG) 0.1 % Apply 1 Application topically 2 (two) times daily.   [DISCONTINUED] loratadine (CLARITIN) 10 MG tablet Take 1 tablet (10 mg total) by mouth daily.   loratadine (CLARITIN) 10 MG tablet Take 1 tablet (10 mg total) by mouth daily.   No facility-administered encounter medications on file as of 08/09/2021.    Past Medical History:  Diagnosis Date   Allergy    Arthritis    fingers   Colon polyp    Diabetes mellitus    Diverticulosis    GERD (gastroesophageal reflux disease)    occasionally   H/O hiatal hernia    ?   Hemorrhoids    Hyperlipidemia    takes fish oil   Hypertension    Infectious colitis    Ventral hernia     Past Surgical History:  Procedure Laterality Date   colonscopy     DIAGNOSTIC LAPAROSCOPY     FOOT SURGERY Bilateral    hammer toes   HEMORRHOID SURGERY     HERNIA REPAIR  01/20/2011   incisional   INCISIONAL HERNIA REPAIR  01/20/2011   Procedure: LAPAROSCOPIC INCISIONAL HERNIA;  Surgeon: Judieth Keens, DO;  Location: [facility];  Service: General;  Laterality: N/A;  Laparoscopic repair of a recurrent incisional hernia with Mesh  INGUINAL HERNIA REPAIR     x's 3   VENTRAL HERNIA REPAIR      Family History  Problem Relation Age of Onset   Breast cancer Mother    Diabetes Mother    Dementia Mother    Liver cancer Mother    Heart disease Father        heart attack   Diabetes Brother    Breast cancer Sister    Colon cancer Neg Hx    Stomach cancer Neg Hx     Social History   Socioeconomic History   Marital status: Divorced    Spouse name: Not on file   Number of children: 1   Years of education: Not on file   Highest education level: Not on file  Occupational History   Occupation: assessment specialist-retired    Comment: city of [place]  Tobacco Use   Smoking status: Former    Packs/day: 1.00    Years: 20.00    Total pack years: 20.00    Types: Cigarettes    Quit  date: 01/05/1996    Years since quitting: 25.6   Smokeless tobacco: Never  Vaping Use   Vaping Use: Never used  Substance and Sexual Activity   Alcohol use: Yes    Alcohol/week: 4.0 standard drinks of alcohol    Types: 4 Glasses of wine per week    Comment: occasional wine   Drug use: No   Sexual activity: Not Currently  Other Topics Concern   Not on file  Social History Narrative   Exercise 3-4  times a week   Social Determinants of Health   Financial Resource Strain: Low Risk  (09/07/2020)   Overall Financial Resource Strain (CARDIA)    Difficulty of Paying Living Expenses: Not hard at all  Food Insecurity: No Food Insecurity (09/07/2020)   Hunger Vital Sign    Worried About Running Out of Food in the Last Year: Never true    Ran Out of Food in the Last Year: Never true  Transportation Needs: No Transportation Needs (09/07/2020)   PRAPARE - Hydrologist (Medical): No    Lack of Transportation (Non-Medical): No  Physical Activity: Sufficiently Active (09/07/2020)   Exercise Vital Sign    Days of Exercise per Week: 7 days    Minutes of Exercise per Session: 60 min  Stress: No Stress Concern Present (09/07/2020)   [place]    Feeling of Stress : Only a little  Social Connections: Moderately Isolated (09/07/2020)   Social Connection and Isolation Panel [NHANES]    Frequency of Communication with Friends and Family: More than three times a week    Frequency of Social Gatherings with Friends and Family: More than three times a week    Attends Religious Services: More than 4 times per year    Active Member of [employer] or Organizations: No    Attends Archivist Meetings: Never    Marital Status: Divorced  Human resources officer Violence: Not At Risk (09/07/2020)   Humiliation, Afraid, Rape, and Kick questionnaire    Fear of Current or Ex-Partner: No    Emotionally Abused: No     Physically Abused: No    Sexually Abused: No    Review of Systems  Constitutional:  Negative for chills, fever and weight loss.  Eyes:  Negative for blurred vision.  All other systems reviewed and are negative.       Objective  BP 122/60 (BP Location: Left Arm, Patient Position: Sitting, Cuff Size: Large)   Pulse 83   Temp 98.8 F (37.1 C) (Oral)   Ht '5\' 5"'$  (1.651 m)   Wt 172 lb 3.2 oz (78.1 kg)   SpO2 98%   BMI 28.66 kg/m   Physical Exam Vitals reviewed.  Constitutional:      Appearance: Normal appearance. She is well-groomed and normal weight.  HENT:     Head: Normocephalic and atraumatic.     Mouth/Throat:     Mouth: Mucous membranes are moist.     Pharynx: Oropharynx is clear.  Eyes:     Extraocular Movements: Extraocular movements intact.     Conjunctiva/sclera: Conjunctivae normal.     Pupils: Pupils are equal, round, and reactive to light.  Cardiovascular:     Rate and Rhythm: Normal rate and regular rhythm.     Pulses: Normal pulses.     Heart sounds: S1 normal and S2 normal.  Pulmonary:     Effort: Pulmonary effort is normal.     Breath sounds: Normal breath sounds and air entry.  Abdominal:     General: Abdomen is flat. Bowel sounds are normal.     Palpations: Abdomen is soft.  Musculoskeletal:        General: Normal range of motion.     Cervical back: Normal range of motion and neck supple.     Right lower leg: No edema.     Left lower leg: No edema.  Skin:    General: Skin is warm and dry.  Neurological:     Mental Status: She is alert and oriented to person, place, and time. Mental status is at baseline.     Gait: Gait is intact.  Psychiatric:        Mood and Affect: Mood and affect normal.        Speech: Speech normal.        Behavior: Behavior normal.        Judgment: Judgment normal.         Assessment & Plan:   Problem List Items Addressed This Visit       Cardiovascular and Mediastinum   HTN (hypertension)    Well  controlled today on her current medications, no side effects reported, will continue her current meds. Continue to monitor BP at each visit.        Respiratory   Allergic rhinitis    Patient needs refills on her claritin today, sx are usually well controlled on this medication. Will continue        Endocrine   Uncontrolled type 2 diabetes mellitus with hyperglycemia ([place])    Well controlled on the glimepiride and janumet, will continue these medications. She is UTD on her eye exam and her foot exam. Continue crestor to lower CVD risk       Type 2 diabetes mellitus with hyperglycemia, without long-term current use of insulin ([facility]) - Primary   Relevant Orders   POC HgB A1c (Completed)   Other Visit Diagnoses     Cough       Relevant Medications   loratadine (CLARITIN) 10 MG tablet   Viral upper respiratory tract infection       Relevant Medications   loratadine (CLARITIN) 10 MG tablet       Return in about 6 months (around 02/09/2022).   Farrel Conners, MD

## 2021-08-10 DIAGNOSIS — M5136 Other intervertebral disc degeneration, lumbar region: Secondary | ICD-10-CM | POA: Insufficient documentation

## 2021-08-10 NOTE — Assessment & Plan Note (Signed)
Well controlled on the glimepiride and janumet, will continue these medications. She is UTD on her eye exam and her foot exam. Continue crestor to lower CVD risk

## 2021-08-10 NOTE — Assessment & Plan Note (Signed)
Well controlled today on her current medications, no side effects reported, will continue her current meds. Continue to monitor BP at each visit.

## 2021-08-10 NOTE — Assessment & Plan Note (Signed)
Patient needs refills on her claritin today, sx are usually well controlled on this medication. Will continue

## 2021-08-23 DIAGNOSIS — E1151 Type 2 diabetes mellitus with diabetic peripheral angiopathy without gangrene: Secondary | ICD-10-CM | POA: Diagnosis not present

## 2021-08-23 DIAGNOSIS — M19072 Primary osteoarthritis, left ankle and foot: Secondary | ICD-10-CM | POA: Diagnosis not present

## 2021-08-23 DIAGNOSIS — D2371 Other benign neoplasm of skin of right lower limb, including hip: Secondary | ICD-10-CM | POA: Diagnosis not present

## 2021-08-23 DIAGNOSIS — M19071 Primary osteoarthritis, right ankle and foot: Secondary | ICD-10-CM | POA: Diagnosis not present

## 2021-09-12 ENCOUNTER — Ambulatory Visit (INDEPENDENT_AMBULATORY_CARE_PROVIDER_SITE_OTHER): Payer: Medicare Other | Admitting: *Deleted

## 2021-09-12 DIAGNOSIS — Z0000 Encounter for general adult medical examination without abnormal findings: Secondary | ICD-10-CM

## 2021-09-12 NOTE — Progress Notes (Cosign Needed)
Subjective:   Megan [name] is a 66 y.o. female who presents for Medicare Annual (Subsequent) preventive examination. I connected with  [name] on 09/12/21 by a audio enabled telemedicine application and verified that I am speaking with the correct person using two identifiers.  Patient Location: Home  Provider Location: Office/Clinic  I discussed the limitations of evaluation and management by telemedicine. The patient expressed understanding and agreed to proceed.    Review of Systems    Defer to PCP Cardiac Risk Factors include: advanced age (>3mn, >>52women);diabetes mellitus;hypertension;dyslipidemia     Objective:    There were no vitals filed for this visit. There is no height or weight on file to calculate BMI.     09/12/2021    1:57 PM 09/07/2020    2:28 PM 09/15/2019    8:19 AM 10/11/2016    3:27 PM 01/20/2011   12:31 PM 01/16/2011   12:48 PM  Advanced Directives  Does Patient Have a Medical Advance Directive? No Yes Yes Yes Patient has advance directive, copy not in chart Patient has advance directive, copy not in chart  Type of Advance Directive  [place] will Living will    Copy of [place] Chart?  No - copy requested   Copy requested from family Copy requested from family  Would patient like information on creating a medical advance directive? No - Patient declined       Pre-existing out of facility DNR order (yellow form or pink MOST form)     No     Current Medications (verified) Outpatient Encounter Medications as of 09/12/2021  Medication Sig   ALPRAZolam (XANAX) 0.5 MG tablet Take 1 tablet (0.5 mg total) by mouth at bedtime as needed for anxiety.   amLODipine (NORVASC) 10 MG tablet TAKE 1 TABLET(10 MG) BY MOUTH DAILY   b complex vitamins tablet Take 1 tablet by mouth daily.   Cholecalciferol (VITAMIN D3) 250 MCG (10000 UT) TABS Take by mouth daily.   diazepam (VALIUM) 5 MG  tablet Take 1 tablet (5 mg total) by mouth every 12 (twelve) hours as needed for anxiety.   Fish Oil-Cholecalciferol (FISH OIL + D3 PO) Take 1,200 mg by mouth daily.    fluticasone (FLONASE) 50 MCG/ACT nasal spray SHAKE LIQUID AND USE 2 SPRAYS IN EACH NOSTRIL DAILY   gabapentin (NEURONTIN) 100 MG capsule Take 1 capsule (100 mg total) by mouth 2 (two) times daily.   glimepiride (AMARYL) 1 MG tablet Take 1 tablet (1 mg total) by mouth daily with breakfast.   loratadine (CLARITIN) 10 MG tablet Take 1 tablet (10 mg total) by mouth daily.   losartan (COZAAR) 100 MG tablet TK 1 T PO D   montelukast (SINGULAIR) 10 MG tablet Take 1 tablet (10 mg total) by mouth at bedtime.   Multiple Vitamin (MULTIVITAMIN) tablet Take 1 tablet by mouth daily. OVER 50 VITAMIN   rosuvastatin (CRESTOR) 5 MG tablet Take 1 tablet (5 mg total) by mouth daily.   sitaGLIPtin-metformin (JANUMET) 50-1000 MG tablet Take 1 tablet by mouth 2 (two) times daily with a meal.   triamcinolone cream (KENALOG) 0.1 % Apply 1 Application topically 2 (two) times daily.   No facility-administered encounter medications on file as of 09/12/2021.    Allergies (verified) Aspirin   History: Past Medical History:  Diagnosis Date   Allergy    Arthritis    fingers   Colon polyp    Diabetes mellitus  Diverticulosis    GERD (gastroesophageal reflux disease)    occasionally   H/O hiatal hernia    ?   Hemorrhoids    Hyperlipidemia    takes fish oil   Hypertension    Infectious colitis    Ventral hernia    Past Surgical History:  Procedure Laterality Date   colonscopy     DIAGNOSTIC LAPAROSCOPY     FOOT SURGERY Bilateral    hammer toes   HEMORRHOID SURGERY     HERNIA REPAIR  01/20/2011   incisional   INCISIONAL HERNIA REPAIR  01/20/2011   Procedure: LAPAROSCOPIC INCISIONAL HERNIA;  Surgeon: Judieth Keens, DO;  Location: [facility];  Service: General;  Laterality: N/A;  Laparoscopic repair of a recurrent incisional hernia with  Mesh   INGUINAL HERNIA REPAIR     x's 3   VENTRAL HERNIA REPAIR     Family History  Problem Relation Age of Onset   Breast cancer Mother    Diabetes Mother    Dementia Mother    Liver cancer Mother    Heart disease Father        heart attack   Diabetes Brother    Breast cancer Sister    Colon cancer Neg Hx    Stomach cancer Neg Hx    Social History   Socioeconomic History   Marital status: Divorced    Spouse name: Not on file   Number of children: 1   Years of education: Not on file   Highest education level: Not on file  Occupational History   Occupation: assessment specialist-retired    Comment: city of [place]  Tobacco Use   Smoking status: Former    Packs/day: 1.00    Years: 20.00    Total pack years: 20.00    Types: Cigarettes    Quit date: 01/05/1996    Years since quitting: 25.7   Smokeless tobacco: Never  Vaping Use   Vaping Use: Never used  Substance and Sexual Activity   Alcohol use: Yes    Alcohol/week: 4.0 standard drinks of alcohol    Types: 4 Glasses of wine per week    Comment: occasional wine   Drug use: No   Sexual activity: Not Currently  Other Topics Concern   Not on file  Social History Narrative   Exercise 3-4  times a week   Social Determinants of Health   Financial Resource Strain: Low Risk  (09/07/2020)   Overall Financial Resource Strain (CARDIA)    Difficulty of Paying Living Expenses: Not hard at all  Food Insecurity: No Food Insecurity (09/07/2020)   Hunger Vital Sign    Worried About Running Out of Food in the Last Year: Never true    Ran Out of Food in the Last Year: Never true  Transportation Needs: No Transportation Needs (09/07/2020)   PRAPARE - Hydrologist (Medical): No    Lack of Transportation (Non-Medical): No  Physical Activity: Sufficiently Active (09/07/2020)   Exercise Vital Sign    Days of Exercise per Week: 7 days    Minutes of Exercise per Session: 60 min  Stress: No Stress  Concern Present (09/07/2020)   [place]    Feeling of Stress : Only a little  Social Connections: Moderately Isolated (09/07/2020)   Social Connection and Isolation Panel [NHANES]    Frequency of Communication with Friends and Family: More than three times a week    Frequency  of Social Gatherings with Friends and Family: More than three times a week    Attends Religious Services: More than 4 times per year    Active Member of [employer] or Organizations: No    Attends Music therapist: Never    Marital Status: Divorced    Tobacco Counseling Counseling given: Not Answered   Clinical Intake:  Pre-visit preparation completed: Yes  Pain : No/denies pain     Nutritional Risks: None Diabetes: Yes CBG done?: No Did pt. bring in CBG monitor from home?: No     Diabetic? Yes Nutrition Risk Assessment:  Has the patient had any N/V/D within the last 2 months?  No  Does the patient have any non-healing wounds?  No  Has the patient had any unintentional weight loss or weight gain?  No   Diabetes:  Is the patient diabetic?  Yes  If diabetic, was a CBG obtained today?  No  Did the patient bring in their glucometer from home?   Audio visit How often do you monitor your CBG's? daily.   Financial Strains and Diabetes Management:  Are you having any financial strains with the device, your supplies or your medication? No .  Does the patient want to be seen by Chronic Care Management for management of their diabetes?  No  Would the patient like to be referred to a Nutritionist or for Diabetic Management?  No   Diabetic Exams:  Diabetic Eye Exam: Completed 06/24/21 Diabetic Foot Exam: Completed 05/24/21    Interpreter Needed?: No  Information entered by :: Beatris Ship, CMA   Activities of Daily Living    09/12/2021    2:15 PM 12/21/2020    8:37 AM  In your present state of health, do you have any  difficulty performing the following activities:  Hearing? 0 0  Vision? 0 0  Difficulty concentrating or making decisions? 0 0  Walking or climbing stairs? 1 0  Comment knee arthritis   Dressing or bathing? 0 0  Doing errands, shopping? 0 0  Preparing Food and eating ? N   Using the Toilet? N   In the past six months, have you accidently leaked urine? N   Do you have problems with loss of bowel control? N   Managing your Medications? N   Managing your Finances? N   Housekeeping or managing your Housekeeping? N     Patient Care Team: Farrel Conners, MD as PCP - General (Family Medicine) Molli Posey, MD as Consulting Physician (Obstetrics and Gynecology) Kidney, [place] any recent Medical Services you may have received from other than [facility] providers in the past year (date may be approximate).     Assessment:   This is a routine wellness examination for Megan [name].  Hearing/Vision screen No results found.  Dietary issues and exercise activities discussed: Current Exercise Habits: Home exercise routine, Type of exercise: walking, Time (Minutes): 60, Frequency (Times/Week): 7, Weekly Exercise (Minutes/Week): 420, Intensity: Mild, Exercise limited by: None identified   Goals Addressed             This Visit's Progress    Patient Stated   On track    Continue losing weight & walking       Depression Screen    09/12/2021    2:11 PM 08/09/2021    4:18 PM 07/08/2021    1:29 PM 12/21/2020    8:37 AM 09/07/2020    2:34 PM 06/01/2020   12:47 PM 12/26/2019  2:31 PM  PHQ 2/9 Scores  PHQ - 2 Score '3 2 1 1 1 '$ 0 0  PHQ- 9 Score '6 7 7        '$ Fall Risk    09/12/2021    1:57 PM 08/09/2021    4:19 PM 07/08/2021    1:29 PM 12/21/2020    8:37 AM 09/07/2020    2:31 PM  Fall Risk   Falls in the past year? 0 0 0 0 1  Number falls in past yr: 0 0 0 0 0  Injury with Fall? 0 0 0 0 1  Comment     back pain  Risk for fall due to : No Fall Risks History of fall(s) No  Fall Risks  History of fall(s)  Follow up Falls evaluation completed Falls evaluation completed Falls evaluation completed Falls evaluation completed Falls prevention discussed    FALL RISK PREVENTION PERTAINING TO THE HOME:  Any stairs in or around the home? Yes  If so, are there any without handrails? No  Home free of loose throw rugs in walkways, pet beds, electrical cords, etc? Yes  Adequate lighting in your home to reduce risk of falls? Yes   ASSISTIVE DEVICES UTILIZED TO PREVENT FALLS:  Life alert? No  Use of a cane, walker or w/c? No  Grab bars in the bathroom? Yes  Shower chair or bench in shower? No  Elevated toilet seat or a handicapped toilet? No     Cognitive Function:        09/12/2021    2:17 PM  6CIT Screen  What Year? 0 points  What month? 0 points  What time? 0 points  Count back from 20 0 points  Months in reverse 0 points  Repeat phrase 0 points  Total Score 0 points    Immunizations Immunization History  Administered Date(s) Administered   Fluad Quad(high Dose 65+) 10/29/2020   Influenza Whole 11/06/2011   PFIZER(Purple Top)SARS-COV-2 Vaccination 03/27/2019, 04/16/2019, 11/16/2019   Pneumococcal Conjugate-13 01/03/2019   Pneumococcal Polysaccharide-23 07/01/2013   Tdap 05/03/2012   Zoster Recombinat (Shingrix) 11/23/2020   Zoster, Live 07/01/2013    TDAP status: Up to date  Flu Vaccine status: Up to date  Pneumococcal vaccine status: Due, Education has been provided regarding the importance of this vaccine. Advised may receive this vaccine at local pharmacy or Health Dept. Aware to provide a copy of the vaccination record if obtained from local pharmacy or Health Dept. Verbalized acceptance and understanding.  Covid-19 vaccine status: Declined, Education has been provided regarding the importance of this vaccine but patient still declined. Advised may receive this vaccine at local pharmacy or Health Dept.or vaccine clinic. Aware to provide a  copy of the vaccination record if obtained from local pharmacy or Health Dept. Verbalized acceptance and understanding.  Qualifies for Shingles Vaccine? Yes   Zostavax completed No   Shingrix Completed?: No.    Education has been provided regarding the importance of this vaccine. Patient has been advised to call insurance company to determine out of pocket expense if they have not yet received this vaccine. Advised may also receive vaccine at local pharmacy or Health Dept. Verbalized acceptance and understanding.  Screening Tests Health Maintenance  Topic Date Due   Pneumonia Vaccine 65+ Years old (3 - PPSV23 or PCV20) 01/03/2020   COVID-19 Vaccine (4 - Pfizer series) 01/11/2020   Zoster Vaccines- Shingrix (2 of 2) 01/18/2021   INFLUENZA VACCINE  08/16/2021   HEMOGLOBIN A1C  02/09/2022  TETANUS/TDAP  05/04/2022   FOOT EXAM  05/25/2022   OPHTHALMOLOGY EXAM  06/25/2022   MAMMOGRAM  07/07/2022   COLONOSCOPY (Pts 45-9yr Insurance coverage will need to be confirmed)  10/26/2026   DEXA SCAN  Completed   Hepatitis C Screening  Completed   HPV VACCINES  Aged Out    Health Maintenance  Health Maintenance Due  Topic Date Due   Pneumonia Vaccine 68 Years old (329- PPSV23 or PCV20) 01/03/2020   COVID-19 Vaccine (4 - Pfizer series) 01/11/2020   Zoster Vaccines- Shingrix (2 of 2) 01/18/2021   INFLUENZA VACCINE  08/16/2021    Colorectal cancer screening: Type of screening: Colonoscopy. Completed 10/25/16. Repeat every 10 years  Mammogram status: Completed 07/06/21. Repeat every year  Bone Density status: Ordered Declined. Pt provided with contact info and advised to call to schedule appt.  Lung Cancer Screening: (Low Dose CT Chest recommended if Age 68-80years, 30 pack-year currently smoking OR have quit w/in 15years.) does not qualify.   Lung Cancer Screening Referral: N/a  Additional Screening:  Hepatitis C Screening: does qualify; Completed 11/08/15  Vision Screening:  Recommended annual ophthalmology exams for early detection of glaucoma and other disorders of the eye. Is the patient up to date with their annual eye exam?  Yes  Who is the provider or what is the name of the office in which the patient attends annual eye exams? [facility] If pt is not established with a provider, would they like to be referred to a provider to establish care? No .   Dental Screening: Recommended annual dental exams for proper oral hygiene  Community Resource Referral / Chronic Care Management: CRR required this visit?  No   CCM required this visit?  No      Plan:     I have personally reviewed and noted the following in the patient's chart:   Medical and social history Use of alcohol, tobacco or illicit drugs  Current medications and supplements including opioid prescriptions. Patient is not currently taking opioid prescriptions. Functional ability and status Nutritional status Physical activity Advanced directives List of other physicians Hospitalizations, surgeries, and ER visits in previous 12 months Vitals Screenings to include cognitive, depression, and falls Referrals and appointments  In addition, I have reviewed and discussed with patient certain preventive protocols, quality metrics, and best practice recommendations. A written personalized care plan for preventive services as well as general preventive health recommendations were provided to patient.  Due to this being a telephonic visit, the after visit summary with patients personalized plan was offered to patient via mail or my-chart. Patient would like to access on my-chart.      BBeatris Ship [place]  09/12/2021   Nurse Notes: None

## 2021-09-12 NOTE — Patient Instructions (Signed)
Megan [name] , Thank you for taking time to come for your Medicare Wellness Visit. I appreciate your ongoing commitment to your health goals. Please review the following plan we discussed and let me know if I can assist you in the future.   These are the goals we discussed:  Goals      Care Coordination Pharmacy Plan      Pharmacist Clinical Goal(s):  Over the next 90 days, patient will try statin through collaboration with PharmD and provider.   Interventions: 1:1 collaboration with Carollee Herter, Alferd Apa, DO regarding development and update of comprehensive plan of care as evidenced by provider attestation and co-signature Inter-disciplinary care team collaboration (see longitudinal plan of care) Comprehensive medication review performed; medication list updated in electronic medical record  Diabetes: Lab Results  Component Value Date   HGBA1C 6.6 (H) 06/17/2020  Goal: A1c < 7 Current treatment: Janumet 50/'1000mg'$  twice a day with a meal Glimepiride '2mg'$  daily with breakfast.  Interventions:  Discussed benefits statin / rosuvastatin to lower heart disease risk Coordinated with Dr Etter Sjogren to discussed statin therapy - recommended start rosuvastatin '5mg'$  daily Discussed potential side effect and reassured patient that they were rare but she should report if she has any changes in muscle aches or pains.  Hypertension: Controlled; Goal BP <140/90 Current treatment: Amlodipine '10mg'$  daily  Losartan '100mg'$  daily   Interventions:  Discussed blood pressure goal Continue current medications for blood pressure  Patient Goals/Self-Care Activities Over the next 90 days, patient will:  take medications as prescribed and  start rosuvastatin '5mg'$  daily. Contact clinical pharmacist or Dr Nonda Lou office if any problems.   Follow Up Plan: No further follow up required: will follow up with provider       Patient Stated     Continue losing weight & walking        This is a list of the  screening recommended for you and due dates:  Health Maintenance  Topic Date Due   Pneumonia Vaccine (3 - PPSV23 or PCV20) 01/03/2020   COVID-19 Vaccine (4 - Pfizer series) 01/11/2020   Zoster (Shingles) Vaccine (2 of 2) 01/18/2021   Flu Shot  08/16/2021   Hemoglobin A1C  02/09/2022   Tetanus Vaccine  05/04/2022   Complete foot exam   05/25/2022   Eye exam for diabetics  06/25/2022   Mammogram  07/07/2022   Colon Cancer Screening  10/26/2026   DEXA scan (bone density measurement)  Completed   Hepatitis C Screening: USPSTF Recommendation to screen - Ages 30-79 yo.  Completed   HPV Vaccine  Aged Out      Follow up in one year for your annual wellness visit    Preventive Care 65 Years and Older, Female Preventive care refers to lifestyle choices and visits with your health care provider that can promote health and wellness. What does preventive care include? A yearly physical exam. This is also called an annual well check. Dental exams once or twice a year. Routine eye exams. Ask your health care provider how often you should have your eyes checked. Personal lifestyle choices, including: Daily care of your teeth and gums. Regular physical activity. Eating a healthy diet. Avoiding tobacco and drug use. Limiting alcohol use. Practicing safe sex. Taking low-dose aspirin every day. Taking vitamin and mineral supplements as recommended by your health care provider. What happens during an annual well check? The services and screenings done by your health care provider during your annual well check will depend on  your age, overall health, lifestyle risk factors, and family history of disease. Counseling  Your health care provider may ask you questions about your: Alcohol use. Tobacco use. Drug use. Emotional well-being. Home and relationship well-being. Sexual activity. Eating habits. History of falls. Memory and ability to understand (cognition). Work and work  Statistician. Reproductive health. Screening  You may have the following tests or measurements: Height, weight, and BMI. Blood pressure. Lipid and cholesterol levels. These may be checked every 5 years, or more frequently if you are over 54 years old. Skin check. Lung cancer screening. You may have this screening every year starting at age 25 if you have a 30-pack-year history of smoking and currently smoke or have quit within the past 15 years. Fecal occult blood test (FOBT) of the stool. You may have this test every year starting at age 24. Flexible sigmoidoscopy or colonoscopy. You may have a sigmoidoscopy every 5 years or a colonoscopy every 10 years starting at age 49. Hepatitis C blood test. Hepatitis B blood test. Sexually transmitted disease (STD) testing. Diabetes screening. This is done by checking your blood sugar (glucose) after you have not eaten for a while (fasting). You may have this done every 1-3 years. Bone density scan. This is done to screen for osteoporosis. You may have this done starting at age 5. Mammogram. This may be done every 1-2 years. Talk to your health care provider about how often you should have regular mammograms. Talk with your health care provider about your test results, treatment options, and if necessary, the need for more tests. Vaccines  Your health care provider may recommend certain vaccines, such as: Influenza vaccine. This is recommended every year. Tetanus, diphtheria, and acellular pertussis (Tdap, Td) vaccine. You may need a Td booster every 10 years. Zoster vaccine. You may need this after age 57. Pneumococcal 13-valent conjugate (PCV13) vaccine. One dose is recommended after age 14. Pneumococcal polysaccharide (PPSV23) vaccine. One dose is recommended after age 32. Talk to your health care provider about which screenings and vaccines you need and how often you need them. This information is not intended to replace advice given to you by  your health care provider. Make sure you discuss any questions you have with your health care provider. Document Released: 01/29/2015 Document Revised: 09/22/2015 Document Reviewed: 11/03/2014 Elsevier Interactive Patient Education  2017 [place] Prevention in the Home Falls can cause injuries. They can happen to people of all ages. There are many things you can do to make your home safe and to help prevent falls. What can I do on the outside of my home? Regularly fix the edges of walkways and driveways and fix any cracks. Remove anything that might make you trip as you walk through a door, such as a raised step or threshold. Trim any bushes or trees on the path to your home. Use bright outdoor lighting. Clear any walking paths of anything that might make someone trip, such as rocks or tools. Regularly check to see if handrails are loose or broken. Make sure that both sides of any steps have handrails. Any raised decks and porches should have guardrails on the edges. Have any leaves, snow, or ice cleared regularly. Use sand or salt on walking paths during winter. Clean up any spills in your garage right away. This includes oil or grease spills. What can I do in the bathroom? Use night lights. Install grab bars by the toilet and in the tub and shower. Do not  use towel bars as grab bars. Use non-skid mats or decals in the tub or shower. If you need to sit down in the shower, use a plastic, non-slip stool. Keep the floor dry. Clean up any water that spills on the floor as soon as it happens. Remove soap buildup in the tub or shower regularly. Attach bath mats securely with double-sided non-slip rug tape. Do not have throw rugs and other things on the floor that can make you trip. What can I do in the bedroom? Use night lights. Make sure that you have a light by your bed that is easy to reach. Do not use any sheets or blankets that are too big for your bed. They should not hang  down onto the floor. Have a firm chair that has side arms. You can use this for support while you get dressed. Do not have throw rugs and other things on the floor that can make you trip. What can I do in the kitchen? Clean up any spills right away. Avoid walking on wet floors. Keep items that you use a lot in easy-to-reach places. If you need to reach something above you, use a strong step stool that has a grab bar. Keep electrical cords out of the way. Do not use floor polish or wax that makes floors slippery. If you must use wax, use non-skid floor wax. Do not have throw rugs and other things on the floor that can make you trip. What can I do with my stairs? Do not leave any items on the stairs. Make sure that there are handrails on both sides of the stairs and use them. Fix handrails that are broken or loose. Make sure that handrails are as long as the stairways. Check any carpeting to make sure that it is firmly attached to the stairs. Fix any carpet that is loose or worn. Avoid having throw rugs at the top or bottom of the stairs. If you do have throw rugs, attach them to the floor with carpet tape. Make sure that you have a light switch at the top of the stairs and the bottom of the stairs. If you do not have them, ask someone to add them for you. What else can I do to help prevent falls? Wear shoes that: Do not have high heels. Have rubber bottoms. Are comfortable and fit you well. Are closed at the toe. Do not wear sandals. If you use a stepladder: Make sure that it is fully opened. Do not climb a closed stepladder. Make sure that both sides of the stepladder are locked into place. Ask someone to hold it for you, if possible. Clearly mark and make sure that you can see: Any grab bars or handrails. First and last steps. Where the edge of each step is. Use tools that help you move around (mobility aids) if they are needed. These  include: Canes. Walkers. Scooters. Crutches. Turn on the lights when you go into a dark area. Replace any light bulbs as soon as they burn out. Set up your furniture so you have a clear path. Avoid moving your furniture around. If any of your floors are uneven, fix them. If there are any pets around you, be aware of where they are. Review your medicines with your doctor. Some medicines can make you feel dizzy. This can increase your chance of falling. Ask your doctor what other things that you can do to help prevent falls. This information is not intended to replace advice  given to you by your health care provider. Make sure you discuss any questions you have with your health care provider. Document Released: 10/29/2008 Document Revised: 06/10/2015 Document Reviewed: 02/06/2014 Elsevier Interactive Patient Education  2017 [employer].

## 2021-09-14 ENCOUNTER — Telehealth: Payer: Self-pay | Admitting: *Deleted

## 2021-09-14 NOTE — Telephone Encounter (Signed)
Routing a medicare wellness visit to you that I did on Monday when you were DOD.  Former [employer] pt but has transferred to another office closer to her.  Visit done as a courtesy since I was already on the phone with her.  Thanks!

## 2021-10-03 ENCOUNTER — Other Ambulatory Visit: Payer: Self-pay | Admitting: Family Medicine

## 2021-10-03 DIAGNOSIS — E785 Hyperlipidemia, unspecified: Secondary | ICD-10-CM

## 2021-10-05 DIAGNOSIS — M7731 Calcaneal spur, right foot: Secondary | ICD-10-CM | POA: Diagnosis not present

## 2021-10-05 DIAGNOSIS — S9031XA Contusion of right foot, initial encounter: Secondary | ICD-10-CM | POA: Diagnosis not present

## 2021-10-05 DIAGNOSIS — M792 Neuralgia and neuritis, unspecified: Secondary | ICD-10-CM | POA: Diagnosis not present

## 2021-10-18 ENCOUNTER — Telehealth: Payer: Self-pay | Admitting: Family Medicine

## 2021-10-18 NOTE — Telephone Encounter (Signed)
Patient is wanting to schedule a pneumonia vaccine.

## 2021-10-19 NOTE — Telephone Encounter (Signed)
Patient informed of the message below.

## 2021-10-19 NOTE — Telephone Encounter (Signed)
She has already had both pneumonia vaccines, she doesn't really need one right now

## 2021-11-02 ENCOUNTER — Telehealth: Payer: Self-pay | Admitting: Family Medicine

## 2021-11-02 NOTE — Telephone Encounter (Signed)
Spoke with the patient and informed her a visit is needed for evaluation.  Patient stated she has had a cough and sinus issues since she attended a class reunion last weekend, also states she usually has the same symptoms every year with weather changes and was told other classmates were being tested for Covid.  Patient denied home testing and an appt was scheduled for tomorrow at 4pm.  Patient was advised to arrive at 3pm for testing prior to the appt.

## 2021-11-02 NOTE — Telephone Encounter (Signed)
Pt called insisting on having a Covid test.  When asked what are her symptoms, Pt stated she did not have any symptoms, but was exposed over the weekend.  Please advise.

## 2021-11-03 ENCOUNTER — Ambulatory Visit: Payer: Medicare Other | Admitting: Family Medicine

## 2021-11-17 DIAGNOSIS — D2371 Other benign neoplasm of skin of right lower limb, including hip: Secondary | ICD-10-CM | POA: Diagnosis not present

## 2021-11-17 DIAGNOSIS — M19071 Primary osteoarthritis, right ankle and foot: Secondary | ICD-10-CM | POA: Diagnosis not present

## 2021-11-17 DIAGNOSIS — E1151 Type 2 diabetes mellitus with diabetic peripheral angiopathy without gangrene: Secondary | ICD-10-CM | POA: Diagnosis not present

## 2021-11-17 DIAGNOSIS — M19072 Primary osteoarthritis, left ankle and foot: Secondary | ICD-10-CM | POA: Diagnosis not present

## 2021-11-28 ENCOUNTER — Encounter: Payer: Self-pay | Admitting: Internal Medicine

## 2021-11-28 ENCOUNTER — Ambulatory Visit: Payer: Medicare Other | Admitting: Internal Medicine

## 2021-11-28 VITALS — BP 134/80 | HR 65 | Ht 65.0 in | Wt 175.2 lb

## 2021-11-28 DIAGNOSIS — D2371 Other benign neoplasm of skin of right lower limb, including hip: Secondary | ICD-10-CM | POA: Diagnosis not present

## 2021-11-28 DIAGNOSIS — M19072 Primary osteoarthritis, left ankle and foot: Secondary | ICD-10-CM | POA: Diagnosis not present

## 2021-11-28 DIAGNOSIS — E119 Type 2 diabetes mellitus without complications: Secondary | ICD-10-CM | POA: Diagnosis not present

## 2021-11-28 DIAGNOSIS — M19071 Primary osteoarthritis, right ankle and foot: Secondary | ICD-10-CM | POA: Diagnosis not present

## 2021-11-28 DIAGNOSIS — E1151 Type 2 diabetes mellitus with diabetic peripheral angiopathy without gangrene: Secondary | ICD-10-CM | POA: Diagnosis not present

## 2021-11-28 DIAGNOSIS — E042 Nontoxic multinodular goiter: Secondary | ICD-10-CM | POA: Diagnosis not present

## 2021-11-28 LAB — TSH: TSH: 0.68 u[IU]/mL (ref 0.35–5.50)

## 2021-11-28 LAB — BASIC METABOLIC PANEL
BUN: 12 mg/dL (ref 6–23)
CO2: 27 mEq/L (ref 19–32)
Calcium: 9.4 mg/dL (ref 8.4–10.5)
Chloride: 103 mEq/L (ref 96–112)
Creatinine, Ser: 0.78 mg/dL (ref 0.40–1.20)
GFR: 77.89 mL/min (ref >60.00)
Glucose, Bld: 108 mg/dL — ABNORMAL HIGH (ref 70–99)
Potassium: 3.8 mEq/L (ref 3.5–5.1)
Sodium: 139 mEq/L (ref 135–145)

## 2021-11-28 LAB — POCT GLYCOSYLATED HEMOGLOBIN (HGB A1C): Hemoglobin A1C: 6.3 % — AB (ref 4.0–5.6)

## 2021-11-28 LAB — T4, FREE: Free T4: 1.14 ng/dL (ref 0.60–1.60)

## 2021-11-28 MED ORDER — JANUMET 50-1000 MG PO TABS: 1.0000 | ORAL_TABLET | Freq: Two times a day (BID) | ORAL | 3 refills | Status: DC

## 2021-11-28 MED ORDER — GLIMEPIRIDE 1 MG PO TABS: 1.0000 mg | ORAL_TABLET | Freq: Every day | ORAL | 3 refills | Status: DC

## 2021-11-28 NOTE — Patient Instructions (Signed)
-   Continue Glimepiride 1 mg, one  tablet before Breakfast  - Decrease Janumet 50-1000 mg ONCE daily      HOW TO TREAT LOW BLOOD SUGARS (Blood sugar LESS THAN 70 MG/DL) Please follow the RULE OF 15 for the treatment of hypoglycemia treatment (when your (blood sugars are less than 70 mg/dL)   STEP 1: Take 15 grams of carbohydrates when your blood sugar is low, which includes:  3-4 GLUCOSE TABS  OR 3-4 OZ OF JUICE OR REGULAR SODA OR ONE TUBE OF GLUCOSE GEL    STEP 2: RECHECK blood sugar in 15 MINUTES STEP 3: If your blood sugar is still low at the 15 minute recheck --> then, go back to STEP 1 and treat AGAIN with another 15 grams of carbohydrates.

## 2021-11-28 NOTE — Progress Notes (Signed)
Name: Megan [name]  Age/ Sex: 66 y.o., female   MRN/ DOB: [id number], 1955/10/28     PCP: Farrel Conners, MD   Reason for Endocrinology Evaluation: Type 2 Diabetes Mellitus  Initial Endocrine Consultative Visit: 09/02/2019    PATIENT IDENTIFIER: Megan [name] is a 66 y.o. female with a past medical history of T2DM, HTN and Dyslipidemia. The patient has followed with Endocrinology clinic since 09/02/2019 for consultative assistance with management of her diabetes.  DIABETIC HISTORY:  Megan [name] was diagnosed with DM in 2008.She has been on Metformin, Glimepiride, repaglinide and Januvia . Her hemoglobin A1c has ranged from 6.5% in 2014, peaking at 7.4% in 10/2018  On her initial visit to our clinic she has an A1c of 7.3% . She was on Repaglinide which we stopped due to hypoglycemia, Decreased Glimepiride and continued Janumet.   SUBJECTIVE:   During the last visit (05/24/2021): A1c 6.3 %     Today (11/28/2021): Megan [name] is here for a follow up on diabetes management.  She checks her blood sugars 1 times daily. The patient has not had hypoglycemic episodes since the last clinic visit.   Weight continues to fluctuate  Denies nausea , vomiting or diarrhea  Denies locak neck swelling  Has dry cough   She is in the donut hole, Janumet is costing $150   HOME DIABETES REGIMEN:  Janumet 50-1000 mg BID Glimepiride 1 mg , 1 tabs daily     Statin: Declines  ACE-I/ARB: yes    METER DOWNLOAD SUMMARY: Unable to download  98 - 500  DIABETIC COMPLICATIONS: Microvascular complications:   Denies: CKD, retinopathy, neuropathy Last Eye Exam: Completed 06/24/2021    Macrovascular complications:   Denies: CAD, CVA, PVD   HISTORY:  Past Medical History:  Past Medical History:  Diagnosis Date   Allergy    Arthritis    fingers   Colon polyp    Diabetes mellitus    Diverticulosis    GERD (gastroesophageal reflux disease)    occasionally   H/O hiatal  hernia    ?   Hemorrhoids    Hyperlipidemia    takes fish oil   Hypertension    Infectious colitis    Ventral hernia    Past Surgical History:  Past Surgical History:  Procedure Laterality Date   colonscopy     DIAGNOSTIC LAPAROSCOPY     FOOT SURGERY Bilateral    hammer toes   HEMORRHOID SURGERY     HERNIA REPAIR  01/20/2011   incisional   INCISIONAL HERNIA REPAIR  01/20/2011   Procedure: LAPAROSCOPIC INCISIONAL HERNIA;  Surgeon: Judieth Keens, DO;  Location: [facility];  Service: General;  Laterality: N/A;  Laparoscopic repair of a recurrent incisional hernia with Mesh   INGUINAL HERNIA REPAIR     x's 3   VENTRAL HERNIA REPAIR     Social History:  reports that she quit smoking about 25 years ago. Her smoking use included cigarettes. She has a 20.00 pack-year smoking history. She has never used smokeless tobacco. She reports current alcohol use of about 4.0 standard drinks of alcohol per week. She reports that she does not use drugs. Family History:  Family History  Problem Relation Age of Onset   Breast cancer Mother    Diabetes Mother    Dementia Mother    Liver cancer Mother    Heart disease Father        heart attack   Diabetes Brother  Breast cancer Sister    Colon cancer Neg Hx    Stomach cancer Neg Hx      HOME MEDICATIONS: Allergies as of 11/28/2021       Reactions   Aspirin Nausea Only        Medication List        Accurate as of November 28, 2021  7:35 AM. If you have any questions, ask your nurse or doctor.          ALPRAZolam 0.5 MG tablet Commonly known as: Xanax Take 1 tablet (0.5 mg total) by mouth at bedtime as needed for anxiety.   amLODipine 10 MG tablet Commonly known as: NORVASC TAKE 1 TABLET(10 MG) BY MOUTH DAILY   b complex vitamins tablet Take 1 tablet by mouth daily.   diazepam 5 MG tablet Commonly known as: VALIUM Take 1 tablet (5 mg total) by mouth every 12 (twelve) hours as needed for anxiety.   FISH OIL + D3  PO Take 1,200 mg by mouth daily.   fluticasone 50 MCG/ACT nasal spray Commonly known as: FLONASE SHAKE LIQUID AND USE 2 SPRAYS IN EACH NOSTRIL DAILY   gabapentin 100 MG capsule Commonly known as: NEURONTIN Take 1 capsule (100 mg total) by mouth 2 (two) times daily.   glimepiride 1 MG tablet Commonly known as: AMARYL Take 1 tablet (1 mg total) by mouth daily with breakfast.   Janumet 50-1000 MG tablet Generic drug: sitaGLIPtin-metformin Take 1 tablet by mouth 2 (two) times daily with a meal.   loratadine 10 MG tablet Commonly known as: CLARITIN Take 1 tablet (10 mg total) by mouth daily.   losartan 100 MG tablet Commonly known as: COZAAR TK 1 T PO D   montelukast 10 MG tablet Commonly known as: SINGULAIR Take 1 tablet (10 mg total) by mouth at bedtime.   multivitamin tablet Take 1 tablet by mouth daily. OVER 50 VITAMIN   rosuvastatin 5 MG tablet Commonly known as: Crestor Take 1 tablet (5 mg total) by mouth daily.   triamcinolone cream 0.1 % Commonly known as: KENALOG Apply 1 Application topically 2 (two) times daily.   Vitamin D3 250 MCG (10000 UT) Tabs Take by mouth daily.         OBJECTIVE:   Vital Signs: BP 134/80 (BP Location: Left Arm, Patient Position: Sitting, Cuff Size: Large)   Pulse 65   Ht '5\' 5"'$  (1.651 m)   Wt 175 lb 3.2 oz (79.5 kg)   SpO2 96%   BMI 29.15 kg/m   Wt Readings from Last 3 Encounters:  11/28/21 175 lb 3.2 oz (79.5 kg)  08/09/21 172 lb 3.2 oz (78.1 kg)  07/08/21 173 lb (78.5 kg)     Exam: General: Pt appears well and is in NAD  Neck: General: Supple without adenopathy. Thyroid: Thyroid size normal.  No goiter or nodules appreciated. No thyroid bruit.  Lungs: Clear with good BS bilat with no rales, rhonchi, or wheezes  Heart: RRR   Abdomen: Normoactive bowel sounds, soft, nontender, without masses or organomegaly palpable  Extremities: No pretibial edema.  Neuro: MS is good with appropriate affect, pt is alert and Ox3     DM foot exam  05/24/2021 The skin of the feet is intact without sores or ulcerations. The pedal pulses are 2+ on right and 2+ on left. The sensation is intact to a screening 5.07, 10 gram monofilament bilaterally     DATA REVIEWED:  Lab Results  Component Value Date   HGBA1C 6.2 (  A) 08/09/2021   HGBA1C 6.3 04/15/2021   HGBA1C 6.4 10/29/2020    Latest Reference Range & Units 11/28/21 07:48  Sodium 135 - 145 mEq/L 139  Potassium 3.5 - 5.1 mEq/L 3.8  Chloride 96 - 112 mEq/L 103  CO2 19 - 32 mEq/L 27  Glucose 70 - 99 mg/dL 108 (H)  BUN 6 - 23 mg/dL 12  Creatinine 0.40 - 1.20 mg/dL 0.78  Calcium 8.4 - 10.5 mg/dL 9.4  GFR >60.00 mL/min 77.89     Latest Reference Range & Units 11/28/21 07:48  TSH 0.35 - 5.50 uIU/mL 0.68  T4,Free(Direct) 0.60 - 1.60 ng/dL 1.14       Lab Results  Component Value Date   MICROALBUR 4.1 (H) 10/29/2020   LDLCALC 82 10/29/2020   CREATININE 0.7 04/15/2021   Lab Results  Component Value Date   MICRALBCREAT 114 04/15/2021     Lab Results  Component Value Date   CHOL 171 10/29/2020   HDL 56.70 10/29/2020   LDLCALC 82 10/29/2020   LDLDIRECT 88.0 04/21/2014   TRIG 159.0 (H) 10/29/2020   CHOLHDL 3 10/29/2020         Thyroid ultrasound 05/30/2018   Estimated total number of nodules >/= 1 cm: 2   Number of spongiform nodules >/=  2 cm not described below (TR1): 0   Number of mixed cystic and solid nodules >/= 1.5 cm not described below ([place]): 0   _________________________________________________________   The previously biopsied mass in the right mid and lower gland measures 5.6 x 4.0 x 2.6 cm. This appears grossly stable compared to prior although measurements were obtained in slightly different planes and so precise measurements are more challenging. However, the mass measured up to 5.2 cm in June of 2015. There has been no significant mitral change over the past 5 years.   Stable appearance of a multiple subcentimeter  nodules scattered throughout the left mid gland. None of the nodules meet criteria for biopsy or further imaging surveillance.   IMPRESSION: No significant interval change in the size or appearance of the previously biopsied nodule occupying the majority of the right gland. Five year stability is consistent with benignity.   Multiple small nodules scattered throughout the left mid gland remain unchanged and do not meet criteria for further evaluation.   The above is in keeping with the ACR TI-RADS recommendations - J Am Coll Radiol 2017;14:587-595.    Right Nodule FNA 05/16/2012  THYROID, FINE NEEDLE ASPIRATION, RIGHT BENIGN. FINDINGS CONSISTENT WITH NON-NEOPLASTIC GOITER.   ASSESSMENT / PLAN / RECOMMENDATIONS:   1) Type 2 Diabetes Mellitus, Optimally controlled, Without complications - Most recent A1c of 6.3 %. Goal A1c < 7.0 %.    - A1c is optimal  without hypoglycemia.  -With an A1c of 6.3% I have recommended discontinuing the glimepiride, but the patient is in the donut hole and Janumet is costing her $150 and she would like to reduce that -I offered to stop Janumet and switch her to plain metformin but she is not keen on this -We have agreed to continue glimepiride and reduce Janumet to once daily    MEDICATIONS: - Continue Glimepiride 1 mg, one  tablet before Breakfast  - Decrease  Janumet 50-1000 mg ONCE daily   EDUCATION / INSTRUCTIONS: BG monitoring instructions: Patient is instructed to check her blood sugars 1 times a day, fasting . Call [facility] if: BG persistently < 70 I reviewed the Rule of 15 for the treatment of hypoglycemia in  detail with the patient. Literature supplied.   2) Diabetic complications:  Eye: Does not have known diabetic retinopathy.  Neuro/ Feet: Does not have known diabetic peripheral neuropathy .  Renal: Patient does not have known baseline CKD. She   is on an ACEI/ARB at present.     3) Multinodular Goiter:  -  She has been diagnosed with MNG in 2014. She is S/P benign FNA of the right nodule in 2014 with benign cytology. Ultrasound in 2020 confirmed 5 year stability  - At this time clinical monitoring is recommended  -  She is clinically euthyroid  - No local neck symptoms  - TFT's are normal     F/U in 6 months    Signed electronically by: Mack Guise, MD  [facility]  [facility] [address] [place], [place] [address] Phone: [phone] FAX: [phone]   CC: Farrel Conners, [place] [address] Phone: [phone]  Fax: [phone]  Return to Endocrinology clinic as below: Future Appointments  Date Time Provider [place]  11/28/2021  7:50 AM Genavieve Mangiapane, Melanie Crazier, MD [facility] None  12/23/2021 10:30 AM Farrel Conners, MD [facility]

## 2021-12-05 ENCOUNTER — Emergency Department (HOSPITAL_BASED_OUTPATIENT_CLINIC_OR_DEPARTMENT_OTHER): Payer: Medicare Other | Admitting: Radiology

## 2021-12-05 ENCOUNTER — Emergency Department (HOSPITAL_BASED_OUTPATIENT_CLINIC_OR_DEPARTMENT_OTHER)
Admission: EM | Admit: 2021-12-05 | Discharge: 2021-12-05 | Disposition: A | Discharge status: Home / Self Care | Payer: Medicare Other | Attending: Emergency Medicine | Admitting: Emergency Medicine

## 2021-12-05 ENCOUNTER — Other Ambulatory Visit: Payer: Self-pay

## 2021-12-05 ENCOUNTER — Telehealth: Payer: Self-pay | Admitting: Family Medicine

## 2021-12-05 ENCOUNTER — Encounter (HOSPITAL_BASED_OUTPATIENT_CLINIC_OR_DEPARTMENT_OTHER): Payer: Self-pay | Admitting: Emergency Medicine

## 2021-12-05 DIAGNOSIS — Z7984 Long term (current) use of oral hypoglycemic drugs: Secondary | ICD-10-CM | POA: Insufficient documentation

## 2021-12-05 DIAGNOSIS — E119 Type 2 diabetes mellitus without complications: Secondary | ICD-10-CM | POA: Diagnosis not present

## 2021-12-05 DIAGNOSIS — Z79899 Other long term (current) drug therapy: Secondary | ICD-10-CM | POA: Insufficient documentation

## 2021-12-05 DIAGNOSIS — R079 Chest pain, unspecified: Secondary | ICD-10-CM | POA: Diagnosis not present

## 2021-12-05 DIAGNOSIS — R0789 Other chest pain: Secondary | ICD-10-CM | POA: Diagnosis not present

## 2021-12-05 DIAGNOSIS — I10 Essential (primary) hypertension: Secondary | ICD-10-CM | POA: Insufficient documentation

## 2021-12-05 LAB — CBC
HCT: 37.8 % (ref 36.0–46.0)
Hemoglobin: 11.6 g/dL — ABNORMAL LOW (ref 12.0–15.0)
MCH: 21.5 pg — ABNORMAL LOW (ref 26.0–34.0)
MCHC: 30.7 g/dL (ref 30.0–36.0)
MCV: 70 fL — ABNORMAL LOW (ref 80.0–100.0)
Platelets: 437 10*3/uL — ABNORMAL HIGH (ref 150–400)
RBC: 5.4 MIL/uL — ABNORMAL HIGH (ref 3.87–5.11)
RDW: 16 % — ABNORMAL HIGH (ref 11.5–15.5)
WBC: 8.7 10*3/uL (ref 4.0–10.5)
nRBC: 0 % (ref 0.0–0.2)

## 2021-12-05 LAB — BASIC METABOLIC PANEL
Anion gap: 10 (ref 5–15)
BUN: 12 mg/dL (ref 8–23)
CO2: 26 mmol/L (ref 22–32)
Calcium: 10.2 mg/dL (ref 8.9–10.3)
Chloride: 104 mmol/L (ref 98–111)
Creatinine, Ser: 0.78 mg/dL (ref 0.44–1.00)
GFR, Estimated: >60 mL/min (ref >60)
Glucose, Bld: 125 mg/dL — ABNORMAL HIGH (ref 70–99)
Potassium: 3.8 mmol/L (ref 3.5–5.1)
Sodium: 140 mmol/L (ref 135–145)

## 2021-12-05 LAB — TROPONIN I (HIGH SENSITIVITY)
Troponin I (High Sensitivity): <2 ng/L (ref <18)
Troponin I (High Sensitivity): 2 ng/L (ref <18)

## 2021-12-05 MED ORDER — JANUMET 50-1000 MG PO TABS: 1.0000 | ORAL_TABLET | Freq: Every day | ORAL | 3 refills | Status: DC

## 2021-12-05 NOTE — ED Triage Notes (Signed)
Pt arrives to ED with c/o left sided chest pain for months. She notes the chest pain is tender.

## 2021-12-05 NOTE — Telephone Encounter (Signed)
Patient was sent to triage for chest pain between neck and top of breat 7 to 8 over 10. No difficulty breathing when pain is present. Has had the pain for a while but got worse over the weekend.  Recommendation was ER.    Refusal to go to ER, wants to see PCP.       Please advise

## 2021-12-05 NOTE — ED Provider Notes (Signed)
[facility] EMERGENCY DEPT Provider Note   CSN: [id number] Arrival date & time: 12/05/21  8182     History  Chief Complaint  Patient presents with   Chest Pain    Megan [name] is a 66 y.o. female.   Chest Pain Chest pain on and off for months, though worsening recently. Symptoms at rest and with exertion though she only has symptoms occasional with exertion. When asked about location, points to central left chest. Also has sinus congestion that drains down throat and won't go away despite mucinex and antihistamine, Flonase. Denies fever, chills, radiation of pain to neck or arm, shortness of breath.   Sister had heart attack at 7 yo. Father with heart attack at the age of 47.   Endorses history of diabetes, hypertensive, high cholesterol.     Home Medications Prior to Admission medications   Medication Sig Start Date End Date Taking? Authorizing Provider  ALPRAZolam Duanne Moron) 0.5 MG tablet Take 1 tablet (0.5 mg total) by mouth at bedtime as needed for anxiety. 11/23/20   Roma Schanz R, DO  amLODipine (NORVASC) 10 MG tablet TAKE 1 TABLET(10 MG) BY MOUTH DAILY 07/07/21   Roma Schanz R, DO  b complex vitamins tablet Take 1 tablet by mouth daily.    [provider]  Cholecalciferol (VITAMIN D3) 250 MCG (10000 UT) TABS Take by mouth daily.    [provider]  diazepam (VALIUM) 5 MG tablet Take 1 tablet (5 mg total) by mouth every 12 (twelve) hours as needed for anxiety. 10/29/20   [name], DO  Fish Oil-Cholecalciferol (FISH OIL + D3 PO) Take 1,200 mg by mouth daily.     [provider]  fluticasone (FLONASE) 50 MCG/ACT nasal spray SHAKE LIQUID AND USE 2 SPRAYS IN [facility] NOSTRIL DAILY 05/12/21   Carollee Herter, Alferd Apa, DO  gabapentin (NEURONTIN) 100 MG capsule Take 1 capsule (100 mg total) by mouth 2 (two) times daily. 10/29/20   [name], DO  glimepiride (AMARYL) 1 MG tablet Take 1 tablet (1 mg total)  by mouth daily with breakfast. 11/28/21   Shamleffer, Melanie Crazier, MD  loratadine (CLARITIN) 10 MG tablet Take 1 tablet (10 mg total) by mouth daily. 08/09/21   Farrel Conners, MD  losartan (COZAAR) 100 MG tablet TK 1 T PO D 02/25/21   Carollee Herter, Yvonne R, DO  montelukast (SINGULAIR) 10 MG tablet Take 1 tablet (10 mg total) by mouth at bedtime. 04/30/17   [name], DO  Multiple Vitamin (MULTIVITAMIN) tablet Take 1 tablet by mouth daily. OVER 50 VITAMIN    [provider]  rosuvastatin (CRESTOR) 5 MG tablet Take 1 tablet (5 mg total) by mouth daily. 09/09/20   [name], DO  sitaGLIPtin-metformin (JANUMET) 50-1000 MG tablet Take 1 tablet by mouth daily at 6 (six) AM. 12/05/21   Shamleffer, Melanie Crazier, MD  triamcinolone cream (KENALOG) 0.1 % Apply 1 Application topically 2 (two) times daily. 07/08/21   Dutch Quint B, FNP      Allergies    Aspirin    Review of Systems   Review of Systems  Cardiovascular:  Positive for chest pain.    Physical Exam Updated Vital Signs BP 132/73   Pulse 77   Temp 99.4 F (37.4 C) (Oral)   Resp (!) 24   Ht '5\' 5"'$  (1.651 m)   Wt 78.9 kg   SpO2 96%   BMI 28.96 kg/m  Physical Exam  ED Results / Procedures / Treatments   Labs (all labs ordered are listed, but only abnormal results are displayed) Labs Reviewed  BASIC METABOLIC PANEL - Abnormal; Notable for the following components:      Result Value   Glucose, Bld 125 (*)    All other components within normal limits  CBC - Abnormal; Notable for the following components:   RBC 5.40 (*)    Hemoglobin 11.6 (*)    MCV 70.0 (*)    MCH 21.5 (*)    RDW 16.0 (*)    Platelets 437 (*)    All other components within normal limits  TROPONIN I (HIGH SENSITIVITY)  TROPONIN I (HIGH SENSITIVITY)    EKG EKG Interpretation  Date/Time:  Monday December 05 2021 10:15:03 EST Ventricular Rate:  88 PR Interval:  176 QRS Duration: 82 QT Interval:  352 QTC  Calculation: 425 R Axis:   65 Text Interpretation: Normal sinus rhythm Normal ECG When compared with ECG of 16-Jan-2011 13:17, No significant change since last tracing Confirmed by Gareth Morgan [phone]) on 12/05/2021 11:08:50 AM  Radiology DG Chest 2 View  Result Date: 12/05/2021 CLINICAL DATA:  Chest pain. EXAM: CHEST - 2 VIEW COMPARISON:  11/04/2019 FINDINGS: The heart size and mediastinal contours are within normal limits. Both lungs are clear. The visualized skeletal structures are unremarkable. IMPRESSION: No active cardiopulmonary disease. Electronically Signed   By: Misty Stanley M.D.   On: 12/05/2021 10:46    Procedures Procedures    Medications Ordered in ED Medications - No data to display  ED Course/ Medical Decision Making/ A&P           HEART Score: 5                Medical Decision Making Amount and/or Complexity of Data Reviewed Labs: ordered. Radiology: ordered.   Patient presenting with intermittent, non-specific chest pain going on for several months recently. Symptoms at rest and with exertion though inconsistently. No personal history of CAD though father w fatal MI at 25. Vital signs normal - sating well, normotensive. Lower extremities without edema, warmth or erythema. Heart and lung exam normal. Ddx includes ACS, DVT, pneumothorax, pneumonia, pericardial effusion/pericarditis, aortic dissection. ECG with NSR, normal axis, normal intervals and no ST elevations or TWIs, no LBBB. Low clinical suspicion for DVT, Wells score 0. CXR without focal opacity, pleural effusion, pneumothorax, widened mediastinum. Troponin <2, 2. Reassured by ECG, low/flat troponin. Given moderate risk, ambulatory referral placed with cardiology for close follow up.         Final Clinical Impression(s) / ED Diagnoses Final diagnoses:  Chest pain, unspecified type    Rx / DC Orders ED Discharge Orders          Ordered    Ambulatory referral to Cardiology        12/05/21 1332               Linward Natal, MD 12/05/21 1357    Gareth Morgan, MD 12/06/21 1220

## 2021-12-05 NOTE — Telephone Encounter (Signed)
Left a detailed message at the patient's cell number she should go to the ER immediately due to the symptoms she is having.  Recommended she seek treatment at the [facility] on [address] or [facility] due to possible shorter wait times.

## 2021-12-05 NOTE — ED Notes (Signed)
RN provided AVS using Teachback Method. Patient verbalizes understanding of Discharge Instructions. Opportunity for Questioning and Answers were provided by RN. Patient Discharged from ED ambulatory to Home via Self.  

## 2021-12-06 ENCOUNTER — Ambulatory Visit (HOSPITAL_BASED_OUTPATIENT_CLINIC_OR_DEPARTMENT_OTHER): Payer: Medicare Other | Admitting: Cardiology

## 2021-12-06 ENCOUNTER — Encounter (HOSPITAL_BASED_OUTPATIENT_CLINIC_OR_DEPARTMENT_OTHER): Payer: Self-pay | Admitting: Cardiology

## 2021-12-06 VITALS — BP 138/64 | HR 90 | Ht 65.0 in | Wt 174.8 lb

## 2021-12-06 DIAGNOSIS — E119 Type 2 diabetes mellitus without complications: Secondary | ICD-10-CM | POA: Diagnosis not present

## 2021-12-06 DIAGNOSIS — Z7189 Other specified counseling: Secondary | ICD-10-CM | POA: Diagnosis not present

## 2021-12-06 DIAGNOSIS — Z8249 Family history of ischemic heart disease and other diseases of the circulatory system: Secondary | ICD-10-CM | POA: Diagnosis not present

## 2021-12-06 DIAGNOSIS — R079 Chest pain, unspecified: Secondary | ICD-10-CM | POA: Diagnosis not present

## 2021-12-06 DIAGNOSIS — I10 Essential (primary) hypertension: Secondary | ICD-10-CM

## 2021-12-06 MED ORDER — METOPROLOL TARTRATE 50 MG PO TABS: ORAL_TABLET | ORAL | 0 refills | Status: DC

## 2021-12-06 NOTE — Patient Instructions (Signed)
Medication Instructions:  Take 50 mg of Metoprolol two hours before the CT scan.   HOLD Janumet the day of your scan.   *If you need a refill on your cardiac medications before your next appointment, please call your pharmacy*   Testing/Procedures: Your physician has requested that you have cardiac CT. Cardiac computed tomography (CT) is a painless test that uses an x-ray machine to take clear, detailed pictures of your heart. For further information please visit [email or web address]. Please follow instruction sheet as given.    Follow-Up: At [facility], you and your health needs are our priority.  As part of our continuing mission to provide you with exceptional heart care, we have created designated Provider Care Teams.  These Care Teams include your primary Cardiologist (physician) and Advanced Practice Providers (APPs -  Physician Assistants and Nurse Practitioners) who all work together to provide you with the care you need, when you need it.  We recommend signing up for the patient portal called "MyChart".  Sign up information is provided on this After Visit Summary.  MyChart is used to connect with patients for Virtual Visits (Telemedicine).  Patients are able to view lab/test results, encounter notes, upcoming appointments, etc.  Non-urgent messages can be sent to your provider as well.   To learn more about what you can do with MyChart, go to [email or web address].    Your next appointment:   As needed based on results   The format for your next appointment:   In Person  Provider:   Buford Dresser, MD     Other Instructions   Your cardiac CT will be scheduled at one of the below locations:   [facility] [address] [place], [place] [address] [phone]  If scheduled at [facility], please arrive at the [facility] and Children's Entrance (Entrance C2) of [facility] 30 minutes prior to test start time. You can use the  FREE valet parking offered at entrance C (encouraged to control the heart rate for the test)  Proceed to the [facility] Radiology Department (first floor) to check-in and test prep.  All radiology patients and guests should use entrance C2 at [facility], accessed from [facility], even though the hospital's physical address listed is [address]       Please follow these instructions carefully (unless otherwise directed):   On the Night Before the Test: Be sure to Drink plenty of water. Do not consume any caffeinated/decaffeinated beverages or chocolate 12 hours prior to your test. Do not take any antihistamines 12 hours prior to your test.  On the Day of the Test: Drink plenty of water until 1 hour prior to the test. Do not eat any food 1 hour prior to test. You may take your regular medications prior to the test.  Take metoprolol (Lopressor) two hours prior to test. HOLD Furosemide/Hydrochlorothiazide morning of the test. FEMALES- please wear underwire-free bra if available, avoid dresses & tight clothing      After the Test: Drink plenty of water. After receiving IV contrast, you may experience a mild flushed feeling. This is normal. On occasion, you may experience a mild rash up to 24 hours after the test. This is not dangerous. If this occurs, you can take Benadryl 25 mg and increase your fluid intake. If you experience trouble breathing, this can be serious. If it is severe call 911 IMMEDIATELY. If it is mild, please call our office. If you take  any of these medications: Glipizide/Metformin, Avandament, Glucavance, please do not take 48 hours after completing test unless otherwise instructed.  We will call to schedule your test 2-4 weeks out understanding that some insurance companies will need an authorization prior to the service being performed.   For non-scheduling related questions, please contact the cardiac imaging nurse navigator  should you have any questions/concerns: Marchia Bond, Cardiac Imaging Nurse Navigator Gordy Clement, Cardiac Imaging Nurse Navigator [facility] Direct Office Dial: [phone]   For scheduling needs, including cancellations and rescheduling, please call [place], [phone].

## 2021-12-06 NOTE — Progress Notes (Unsigned)
Cardiology Office Note:    Date:  12/07/2021   ID:  Megan [name], DOB May 07, 1955, MRN [id number]  PCP:  Farrel Conners, MD  Cardiologist:  Buford Dresser, MD  Referring MD: Gareth Morgan, MD   CC: new patient evaluation for chest pain  History of Present Illness:    Megan [name] is a 66 y.o. female with a hx of type II diabetes, hypertension who is seen as a new consult at the request of Gareth Morgan, MD for the evaluation and management of chest pain.  Seen in the ER 12/05/21 for chest pain. Evaluation unremarkable, referred to cardiology for further evaluation.  Chest pain: -Initial onset: several months, has been on and off. Both with exertion and at rest. Upper left chest area, nonradiating, nontender. -Quality: hard to describe, like something is grabbing her chest -Frequency: now infrequent -Duration: very brief, like a fleeting pain -Associated symptoms: mild heartburn -Aggravating/alleviating factors: worse with drinking coffee. Better with antacid, belching -Prior cardiac history: none -Prior workup: ER visit 12/05/21 with normal ECG, normal hsTn -Prior treatment: none -Alcohol: doesn't drink during the week, can have a bottle of wine on Friday night -Tobacco: quit 27 years ago -Comorbidities: diabetes, hypertension -Exercise level: walks the block, about 45 minutes/day. Cleans house without difficulty -Cardiac ROS: no shortness of breath, no PND, no orthopnea, no LE edema, no syncope -Family history: father died age 80, died of presumed heart attack. No other known heart disease.  Past Medical History:  Diagnosis Date   Allergy    Arthritis    fingers   Colon polyp    Diabetes mellitus    Diverticulosis    GERD (gastroesophageal reflux disease)    occasionally   H/O hiatal hernia    ?   Hemorrhoids    Hyperlipidemia    takes fish oil   Hypertension    Infectious colitis    Ventral hernia     Past Surgical History:   Procedure Laterality Date   colonscopy     DIAGNOSTIC LAPAROSCOPY     FOOT SURGERY Bilateral    hammer toes   HEMORRHOID SURGERY     HERNIA REPAIR  01/20/2011   incisional   INCISIONAL HERNIA REPAIR  01/20/2011   Procedure: LAPAROSCOPIC INCISIONAL HERNIA;  Surgeon: Judieth Keens, DO;  Location: [facility];  Service: General;  Laterality: N/A;  Laparoscopic repair of a recurrent incisional hernia with Mesh   INGUINAL HERNIA REPAIR     x's 3   VENTRAL HERNIA REPAIR      Current Medications: Current Outpatient Medications on File Prior to Visit  Medication Sig   ALPRAZolam (XANAX) 0.5 MG tablet Take 1 tablet (0.5 mg total) by mouth at bedtime as needed for anxiety.   amLODipine (NORVASC) 10 MG tablet TAKE 1 TABLET(10 MG) BY MOUTH DAILY   b complex vitamins tablet Take 1 tablet by mouth daily.   Cholecalciferol (VITAMIN D3) 250 MCG (10000 UT) TABS Take by mouth daily.   Fish Oil-Cholecalciferol (FISH OIL + D3 PO) Take 1,200 mg by mouth daily.    fluticasone (FLONASE) 50 MCG/ACT nasal spray SHAKE LIQUID AND USE 2 SPRAYS IN EACH NOSTRIL DAILY   gabapentin (NEURONTIN) 100 MG capsule Take 1 capsule (100 mg total) by mouth 2 (two) times daily.   glimepiride (AMARYL) 1 MG tablet Take 1 tablet (1 mg total) by mouth daily with breakfast.   loratadine (CLARITIN) 10 MG tablet Take 1 tablet (10 mg total) by mouth daily.  losartan (COZAAR) 100 MG tablet TK 1 T PO D   montelukast (SINGULAIR) 10 MG tablet Take 1 tablet (10 mg total) by mouth at bedtime.   Multiple Vitamin (MULTIVITAMIN) tablet Take 1 tablet by mouth daily. OVER 50 VITAMIN   rosuvastatin (CRESTOR) 5 MG tablet Take 1 tablet (5 mg total) by mouth daily.   sitaGLIPtin-metformin (JANUMET) 50-1000 MG tablet Take 1 tablet by mouth daily at 6 (six) AM.   triamcinolone cream (KENALOG) 0.1 % Apply 1 Application topically 2 (two) times daily.   No current facility-administered medications on file prior to visit.     Allergies:   Aspirin    Social History   Tobacco Use   Smoking status: Former    Packs/day: 1.00    Years: 20.00    Total pack years: 20.00    Types: Cigarettes    Quit date: 01/05/1996    Years since quitting: 25.9   Smokeless tobacco: Never  Vaping Use   Vaping Use: Never used  Substance Use Topics   Alcohol use: Yes    Alcohol/week: 4.0 standard drinks of alcohol    Types: 4 Glasses of wine per week    Comment: occasional wine   Drug use: No    Family History: family history includes Breast cancer in her mother and sister; Dementia in her mother; Diabetes in her brother and mother; Heart disease in her father; Liver cancer in her mother. There is no history of Colon cancer or Stomach cancer.  ROS:   Please see the history of present illness.  Additional pertinent ROS: Constitutional: Negative for chills, fever, night sweats, unintentional weight loss  HENT: Negative for ear pain and hearing loss.   Eyes: Negative for loss of vision and eye pain.  Respiratory: Negative for cough, sputum, wheezing.   Cardiovascular: See HPI. Gastrointestinal: Negative for abdominal pain, melena, and hematochezia.  Genitourinary: Negative for dysuria and hematuria.  Musculoskeletal: Negative for falls and myalgias.  Skin: Negative for itching and rash.  Neurological: Negative for focal weakness, focal sensory changes and loss of consciousness.  Endo/Heme/Allergies: Does not bruise/bleed easily.     EKGs/Labs/Other Studies Reviewed:    The following studies were reviewed today: No prior cardiac studies  EKG:  EKG is personally reviewed.   12/05/21 (ER): NSR at 88 bpm  Recent Labs: 11/28/2021: TSH 0.68 12/05/2021: BUN 12; Creatinine, Ser 0.78; Hemoglobin 11.6; Platelets 437; Potassium 3.8; Sodium 140  Recent Lipid Panel    Component Value Date/Time   CHOL 171 10/29/2020 0932   TRIG 159.0 (H) 10/29/2020 0932   HDL 56.70 10/29/2020 0932   CHOLHDL 3 10/29/2020 0932   VLDL 31.8 10/29/2020 0932    LDLCALC 82 10/29/2020 0932   LDLDIRECT 88.0 04/21/2014 1103    Physical Exam:    VS:  BP 138/64   Pulse 90   Ht '5\' 5"'$  (1.651 m)   Wt 174 lb 12.8 oz (79.3 kg)   SpO2 95%   BMI 29.09 kg/m     Wt Readings from Last 3 Encounters:  12/06/21 174 lb 12.8 oz (79.3 kg)  12/05/21 174 lb (78.9 kg)  11/28/21 175 lb 3.2 oz (79.5 kg)    GEN: Well nourished, well developed in no acute distress HEENT: Normal, moist mucous membranes NECK: No JVD CARDIAC: regular rhythm, normal S1 and S2, no rubs or gallops. No murmur. VASCULAR: Radial and DP pulses 2+ bilaterally. No carotid bruits RESPIRATORY:  Clear to auscultation without rales, wheezing or rhonchi  ABDOMEN: Soft, non-tender,  non-distended MUSCULOSKELETAL:  Ambulates independently SKIN: Warm and dry, no edema NEUROLOGIC:  Alert and oriented x 3. No focal neuro deficits noted. PSYCHIATRIC:  Normal affect    ASSESSMENT:    1. Chest pain of uncertain etiology   2. Primary hypertension   3. Type 2 diabetes mellitus without complication, without long-term current use of insulin (HCC)   4. Cardiac risk counseling   5. Counseling on health promotion and disease prevention   6. Family history of heart disease    PLAN:    Chest pain -likely GI given that it is improved with belching/antacid, worse with coffee. Had acid reflux years ago. -however, has significant cardiovascular risk factors -discussed treadmill stress, nuclear stress/lexiscan, and CT coronary angiography. Discussed pros and cons of each, including but not limited to false positive/false negative risk, radiation risk, and risk of IV contrast dye. Based on shared decision making, decision was made to pursue CT coronary angiography. -will give one time dose of metoprolol 2 hours prior to scheduled test -counseled on use of sublingual nitroglycerin and its importance to a good test  -reviewed red flag warning signs that need immediate medical attention  Type II  diabetes Hypertension Family history of heart disease -continue rosuvastatin for primary prevention. If plaque seen on CT, would intensify and discuss aspirin -would also consider SGLT2i and/or GLP1RA if CAD found  Cardiac risk counseling and prevention recommendations: -recommend heart healthy/Mediterranean diet, with whole grains, fruits, vegetable, fish, lean meats, nuts, and olive oil. Limit salt. -recommend moderate walking, 3-5 times/week for 30-50 minutes each session. Aim for at least 150 minutes.week. Goal should be pace of 3 miles/hours, or walking 1.5 miles in 30 minutes -recommend avoidance of tobacco products. Avoid excess alcohol. -ASCVD risk score: The 10-year ASCVD risk score (Arnett DK, et al., 2019) is: 24.2%   Values used to calculate the score:     Age: 75 years     Sex: Female     Is Non-Hispanic African American: Yes     Diabetic: Yes     Tobacco smoker: No     Systolic Blood Pressure: 917 mmHg     Is BP treated: Yes     HDL Cholesterol: 56.7 mg/dL     Total Cholesterol: 171 mg/dL    Plan for follow up: to be determined based on results of scan  Buford Dresser, MD, PhD, [facility]    Medication Adjustments/Labs and Tests Ordered: Current medicines are reviewed at length with the patient today.  Concerns regarding medicines are outlined above.  Orders Placed This Encounter  Procedures   CT CORONARY MORPH W/CTA COR W/SCORE W/CA W/CM &/OR WO/CM   Meds ordered this encounter  Medications   metoprolol tartrate (LOPRESSOR) 50 MG tablet    Sig: Take 1 tablet by mouth once for procedure.    Dispense:  1 tablet    Refill:  0    Patient Instructions  Medication Instructions:  Take 50 mg of Metoprolol two hours before the CT scan.   HOLD Janumet the day of your scan.   *If you need a refill on your cardiac medications before your next appointment, please call your pharmacy*   Testing/Procedures: Your physician has requested  that you have cardiac CT. Cardiac computed tomography (CT) is a painless test that uses an x-ray machine to take clear, detailed pictures of your heart. For further information please visit [email or web address]. Please follow instruction sheet as given.    Follow-Up: At [facility], you and your health needs are our priority.  As part of our continuing mission to provide you with exceptional heart care, we have created designated Provider Care Teams.  These Care Teams include your primary Cardiologist (physician) and Advanced Practice Providers (APPs -  Physician Assistants and Nurse Practitioners) who all work together to provide you with the care you need, when you need it.  We recommend signing up for the patient portal called "MyChart".  Sign up information is provided on this After Visit Summary.  MyChart is used to connect with patients for Virtual Visits (Telemedicine).  Patients are able to view lab/test results, encounter notes, upcoming appointments, etc.  Non-urgent messages can be sent to your provider as well.   To learn more about what you can do with MyChart, go to [email or web address].    Your next appointment:   As needed based on results   The format for your next appointment:   In Person  Provider:   Buford Dresser, MD     Other Instructions   Your cardiac CT will be scheduled at one of the below locations:   [facility] [address] [place], [place] [address] [phone]  If scheduled at [facility], please arrive at the [facility] and Children's Entrance (Entrance C2) of [facility] 30 minutes prior to test start time. You can use the FREE valet parking offered at entrance C (encouraged to control the heart rate for the test)  Proceed to the [facility] Radiology Department (first floor) to check-in and test prep.  All radiology patients and guests should use entrance C2 at [facility], accessed from [facility], even though the hospital's physical address listed is [address].       Please follow these instructions carefully (unless otherwise directed):   On the Night Before the Test: Be sure to Drink plenty of water. Do not consume any caffeinated/decaffeinated beverages or chocolate 12 hours prior to your test. Do not take any antihistamines 12 hours prior to your test.  On the Day of the Test: Drink plenty of water until 1 hour prior to the test. Do not eat any food 1 hour prior to test. You may take your regular medications prior to the test.  Take metoprolol (Lopressor) two hours prior to test. HOLD Furosemide/Hydrochlorothiazide morning of the test. FEMALES- please wear underwire-free bra if available, avoid dresses & tight clothing      After the Test: Drink plenty of water. After receiving IV contrast, you may experience a mild flushed feeling. This is normal. On occasion, you may experience a mild rash up to 24 hours after the test. This is not dangerous. If this occurs, you can take Benadryl 25 mg and increase your fluid intake. If you experience trouble breathing, this can be serious. If it is severe call 911 IMMEDIATELY. If it is mild, please call our office. If you take any of these medications: Glipizide/Metformin, Avandament, Glucavance, please do not take 48 hours after completing test unless otherwise instructed.  We will call to schedule your test 2-4 weeks out understanding that some insurance companies will need an authorization prior to the service being performed.   For non-scheduling related questions, please contact the cardiac imaging nurse navigator should you have any questions/concerns: Marchia Bond, Cardiac Imaging Nurse Navigator Gordy Clement, Cardiac Imaging Nurse Navigator [place] [facility] Direct Office Dial: [phone]   For scheduling needs, including cancellations and rescheduling,  please call  [place], [phone].          Signed, Buford Dresser, MD PhD 12/07/2021 8:50 AM    [place]

## 2021-12-12 NOTE — Telephone Encounter (Signed)
Vaccine information added under non-specific date of 09/16/21 since exact date was not provided.

## 2021-12-21 ENCOUNTER — Telehealth (HOSPITAL_COMMUNITY): Payer: Self-pay | Admitting: *Deleted

## 2021-12-21 NOTE — Telephone Encounter (Signed)
Reaching out to patient to offer assistance regarding upcoming cardiac imaging study; pt verbalizes understanding of appt date/time,  medications ordered, and verified current allergies; name and call back number provided for further questions should they arise  Gordy Clement RN Navigator Cardiac [facility] [phone] office [phone] cell  Patient will check on metoprolol tablet for test and reports being claustrophobic.  She will take her Xanax prescription and states her sister will be driving her to the appointment.  Patient had to hang up prior to finishing up reviewing instructions. Awaiting patient to call back.

## 2021-12-21 NOTE — Telephone Encounter (Signed)
Patient returning call regarding upcoming cardiac imaging study; pt verbalizes understanding of appt date/time, parking situation and where to check in, pre-test NPO status and medications ordered, and verified current allergies; name and call back number provided for further questions should they arise  Megan Clement RN Navigator Cardiac Imaging [facility] [phone] office [phone] cell  Patient to take '50mg'$  metoprolol tartrate two hours prior to her cardiac CT scan.  She is aware to arrive at 2:30pm.

## 2021-12-22 ENCOUNTER — Ambulatory Visit (HOSPITAL_COMMUNITY)
Admission: RE | Admit: 2021-12-22 | Discharge: 2021-12-22 | Disposition: A | Discharge status: Home / Self Care | Payer: Medicare Other | Source: Ambulatory Visit | Attending: Cardiology | Admitting: Cardiology

## 2021-12-22 ENCOUNTER — Encounter (HOSPITAL_COMMUNITY): Payer: Self-pay

## 2021-12-22 DIAGNOSIS — R079 Chest pain, unspecified: Secondary | ICD-10-CM

## 2021-12-22 MED ORDER — METOPROLOL TARTRATE 5 MG/5ML IV SOLN
INTRAVENOUS | Status: AC
  Filled 2021-12-22: qty 10

## 2021-12-22 MED ORDER — NITROGLYCERIN 0.4 MG SL SUBL: 0.8000 mg | SUBLINGUAL_TABLET | Freq: Once | SUBLINGUAL | Status: DC

## 2021-12-23 ENCOUNTER — Encounter: Payer: Self-pay | Admitting: Family Medicine

## 2021-12-23 ENCOUNTER — Ambulatory Visit (INDEPENDENT_AMBULATORY_CARE_PROVIDER_SITE_OTHER): Payer: Medicare Other | Admitting: Family Medicine

## 2021-12-23 VITALS — BP 98/60 | HR 75 | Temp 98.0°F | Ht 64.25 in | Wt 175.1 lb

## 2021-12-23 DIAGNOSIS — Z23 Encounter for immunization: Secondary | ICD-10-CM | POA: Diagnosis not present

## 2021-12-23 DIAGNOSIS — Z0000 Encounter for general adult medical examination without abnormal findings: Secondary | ICD-10-CM

## 2021-12-23 DIAGNOSIS — J301 Allergic rhinitis due to pollen: Secondary | ICD-10-CM | POA: Diagnosis not present

## 2021-12-23 DIAGNOSIS — F409 Phobic anxiety disorder, unspecified: Secondary | ICD-10-CM | POA: Diagnosis not present

## 2021-12-23 MED ORDER — PREDNISONE 20 MG PO TABS: 40.0000 mg | ORAL_TABLET | Freq: Every day | ORAL | 0 refills | Status: AC

## 2021-12-23 MED ORDER — ALPRAZOLAM 0.5 MG PO TABS: ORAL_TABLET | ORAL | 0 refills | Status: DC

## 2021-12-23 NOTE — Assessment & Plan Note (Signed)
Will treat with a short burst of steroids 40 mg daily for 4 days to help reduce inflammation in the sinuses.

## 2021-12-23 NOTE — Addendum Note (Signed)
Addended by: Agnes Lawrence on: 12/23/2021 12:10 PM   Modules accepted: Orders

## 2021-12-23 NOTE — Progress Notes (Signed)
Complete physical exam  Patient: Megan [name]   DOB: 08/12/1955   66 y.o. Female  MRN: [id number]  Subjective:    Chief Complaint  Patient presents with   Annual Exam    Megan [name] is a 66 y.o. female who presents today for a complete physical exam. She reports consuming a diabetic,   calorie diet. Home exercise routine includes working part time, usually tries to walk daily. She generally feels well. She reports sleeping relatively well. Still only getting about 4-5 hours of sleep at night, is waking up much earlier than she wants to.  She does have additional problems to discuss today.   Pt asking for refill of her xanax 0.5 mg PRN anxiety. States that she is supposed to have a CT scan of her coronary arteries and she is clasutrophobic.   She is also reporting an increase in URI symptoms for the past 3-4 weeks, states that she is coughing daily, no fever/chills, no sore throat. States she has been taking her allergy medications but they are not helping much.  Most recent fall risk assessment:    09/12/2021    1:57 PM  [place] in the past year? 0  Number falls in past yr: 0  Injury with Fall? 0  Risk for fall due to : No Fall Risks  Follow up Falls evaluation completed     Most recent depression screenings:    09/12/2021    2:11 PM 08/09/2021    4:18 PM  PHQ 2/9 Scores  PHQ - 2 Score 3 2  PHQ- 9 Score 6 7    Vision:Within last year and Dental: No current dental problems and Receives regular dental care  Patient Active Problem List   Diagnosis Date Noted   DDD (degenerative disc disease), lumbar 08/10/2021   Low back pain with radiation 12/21/2020   Preventative health care 12/21/2020   At risk for cardiovascular event 11/23/2020   Type 2 diabetes mellitus without complication, without long-term current use of insulin ([place]) 11/23/2020   Multinodular goiter 11/23/2020   Generalized abdominal pain 02/19/2020   History of hernia repair 02/19/2020    Hair loss 02/17/2020   Controlled diabetes mellitus type 2 with complications ([place]) 10/08/3005   Diabetes mellitus without complication ([place]) 62/26/3335   Type 2 diabetes mellitus with hyperglycemia, without long-term current use of insulin ([place]) 09/02/2019   Dyslipidemia 09/02/2019   Uncontrolled type 2 diabetes mellitus with hyperglycemia ([place]) 11/05/2018   Fatigue 11/05/2018   Acute non-recurrent pansinusitis 04/25/2018   Bronchitis 05/21/2017   CAP (community acquired pneumonia) 10/15/2013   Wheezing 09/29/2013   Pain, knee 10/02/2012   Gastrocnemius muscle strain 10/02/2012   Wrist sprain 10/02/2012   Thyroid nodule 05/03/2012   Rash and nonspecific skin eruption 03/14/2012   HAIR LOSS 03/28/2010   COLONIC POLYPS, HX OF 11/09/2009   HEMANGIOMA, HEPATIC 11/04/2009   UNSPECIFIED ANEMIA 11/04/2009   DIVERTICULOSIS, COLON 11/04/2009   FATTY LIVER DISEASE 11/04/2009   PALPITATIONS 09/06/2009   Chronic fatigue 08/07/2008   Allergic rhinitis 04/10/2007   Diabetes mellitus type II, controlled ([place]) 12/21/2006   Hyperlipidemia 04/12/2006   HTN (hypertension) 04/12/2006      Patient Care Team: Farrel Conners, MD as PCP - General (Family Medicine) Buford Dresser, MD as PCP - Cardiology (Cardiology) Molli Posey, MD as Consulting Physician (Obstetrics and Gynecology) [facility], [facility]   Outpatient Medications Prior to Visit  Medication Sig   b complex vitamins tablet  Take 1 tablet by mouth daily.   Cholecalciferol (VITAMIN D3) 250 MCG (10000 UT) TABS Take by mouth daily.   Fish Oil-Cholecalciferol (FISH OIL + D3 PO) Take 1,200 mg by mouth daily.    fluticasone (FLONASE) 50 MCG/ACT nasal spray SHAKE LIQUID AND USE 2 SPRAYS IN EACH NOSTRIL DAILY   glimepiride (AMARYL) 1 MG tablet Take 1 tablet (1 mg total) by mouth daily with breakfast.   loratadine (CLARITIN) 10 MG tablet Take 1 tablet (10 mg total) by mouth daily.   losartan (COZAAR) 100 MG tablet TK  1 T PO D   Multiple Vitamin (MULTIVITAMIN) tablet Take 1 tablet by mouth daily. OVER 50 VITAMIN   rosuvastatin (CRESTOR) 5 MG tablet Take 1 tablet (5 mg total) by mouth daily.   sitaGLIPtin-metformin (JANUMET) 50-1000 MG tablet Take 1 tablet by mouth daily at 6 (six) AM.   [DISCONTINUED] ALPRAZolam (XANAX) 0.5 MG tablet Take 1 tablet (0.5 mg total) by mouth at bedtime as needed for anxiety.   [DISCONTINUED] gabapentin (NEURONTIN) 100 MG capsule Take 1 capsule (100 mg total) by mouth 2 (two) times daily.   [DISCONTINUED] metoprolol tartrate (LOPRESSOR) 50 MG tablet Take 1 tablet by mouth once for procedure.   [DISCONTINUED] montelukast (SINGULAIR) 10 MG tablet Take 1 tablet (10 mg total) by mouth at bedtime.   [DISCONTINUED] triamcinolone cream (KENALOG) 0.1 % Apply 1 Application topically 2 (two) times daily.   amLODipine (NORVASC) 10 MG tablet TAKE 1 TABLET(10 MG) BY MOUTH DAILY   No facility-administered medications prior to visit.    Review of Systems  HENT:  Negative for hearing loss.   Eyes:  Negative for blurred vision.  Respiratory:  Negative for shortness of breath.   Cardiovascular:  Negative for chest pain.  Gastrointestinal: Negative.   Genitourinary: Negative.   Musculoskeletal:  Negative for back pain.  Neurological:  Negative for headaches.  Psychiatric/Behavioral:  Negative for depression.           Objective:     BP 98/60 (BP Location: Left Arm, Patient Position: Sitting, Cuff Size: Large)   Pulse 75   Temp 98 F (36.7 C) (Oral)   Ht 5' 4.25" (1.632 m)   Wt 175 lb 1.6 oz (79.4 kg)   SpO2 98%   BMI 29.82 kg/m    Physical Exam Vitals reviewed.  Constitutional:      Appearance: Normal appearance. She is well-groomed and normal weight.  HENT:     Right Ear: Tympanic membrane and ear canal normal.     Left Ear: Tympanic membrane and ear canal normal.     Mouth/Throat:     Mouth: Mucous membranes are moist.     Pharynx: Oropharynx is clear. No  oropharyngeal exudate or posterior oropharyngeal erythema.  Eyes:     Conjunctiva/sclera: Conjunctivae normal.  Neck:     Thyroid: No thyromegaly.  Cardiovascular:     Rate and Rhythm: Normal rate and regular rhythm.     Pulses: Normal pulses.     Heart sounds: S1 normal and S2 normal.  Pulmonary:     Effort: Pulmonary effort is normal.     Breath sounds: Normal breath sounds and air entry.  Abdominal:     General: Bowel sounds are normal.     Palpations: Abdomen is soft.  Musculoskeletal:     Right lower leg: No edema.     Left lower leg: No edema.  Neurological:     Mental Status: She is alert and oriented to person, place, and  time. Mental status is at baseline.     Gait: Gait is intact.  Psychiatric:        Mood and Affect: Mood and affect normal.        Speech: Speech normal.        Behavior: Behavior normal.        Judgment: Judgment normal.      No results found for any visits on 12/23/21.     Assessment & Plan:    Routine Health Maintenance and Physical Exam  Immunization History  Administered Date(s) Administered   Fluad Quad(high Dose 65+) 10/29/2020   Influenza Whole 11/06/2011   Influenza-Unspecified 09/16/2021   PFIZER(Purple Top)SARS-COV-2 Vaccination 03/27/2019, 04/16/2019, 11/16/2019   Pneumococcal Conjugate-13 01/03/2019   Pneumococcal Polysaccharide-23 07/01/2013   Tdap 05/03/2012   Zoster Recombinat (Shingrix) 11/23/2020   Zoster, Live 07/01/2013    Health Maintenance  Topic Date Due   Pneumonia Vaccine 38+ Years old (3 - PPSV23 or PCV20) 01/03/2020   Zoster Vaccines- Shingrix (2 of 2) 01/18/2021   COVID-19 Vaccine (4 - 2023-24 season) 09/16/2021   Diabetic kidney evaluation - Urine ACR  04/16/2022   DTaP/Tdap/Td (2 - Td or Tdap) 05/04/2022   FOOT EXAM  05/25/2022   HEMOGLOBIN A1C  05/29/2022   OPHTHALMOLOGY EXAM  06/25/2022   MAMMOGRAM  07/07/2022   Medicare Annual Wellness (AWV)  09/13/2022   Diabetic kidney evaluation - GFR  measurement  12/06/2022   COLONOSCOPY (Pts 45-42yr Insurance coverage will need to be confirmed)  10/26/2026   INFLUENZA VACCINE  Completed   DEXA SCAN  Completed   Hepatitis C Screening  Completed   HPV VACCINES  Aged Out    Discussed health benefits of physical activity, and encouraged her to engage in regular exercise appropriate for her age and condition.  Problem List Items Addressed This Visit       Unprioritized   Allergic rhinitis    Will treat with a short burst of steroids 40 mg daily for 4 days to help reduce inflammation in the sinuses.       Relevant Medications   predniSONE (DELTASONE) 20 MG tablet   Preventative health care  Physical exam findings are WNL, she is due for her PPSV 23 today. She is otherwise UTD on her health maintenance measures. RTC in 6 months for regular follow up.    Other Visit Diagnoses     Immunization due    -  Primary   Relevant Orders   Pneumococcal polysaccharide vaccine 23-valent greater than or equal to 2yo subcutaneous/IM   Phobia, unspecified type       Relevant Medications   ALPRAZolam (XANAX) 0.5 MG tablet, take 1-2 tablets before the CT scan.       Return in about 6 months (around 06/24/2022) for follow up HTN and HLD.     BFarrel Conners MD

## 2022-01-03 ENCOUNTER — Telehealth: Payer: Self-pay | Admitting: Cardiology

## 2022-01-03 MED ORDER — METOPROLOL TARTRATE 50 MG PO TABS: ORAL_TABLET | ORAL | 0 refills | Status: DC

## 2022-01-03 NOTE — Telephone Encounter (Signed)
Patient is calling about getting a prescription for metoprolol. Told patient I don't see where it's on her medication list. Patient said that she told a pill before CT and was thinking that was it. Please call back to verify

## 2022-01-03 NOTE — Telephone Encounter (Signed)
Sent Metoprolol 50 mg to pharmacy and advised to make sure has her Xanax prior to CT Patient verbalized understanding

## 2022-01-04 ENCOUNTER — Telehealth (HOSPITAL_COMMUNITY): Payer: Self-pay | Admitting: *Deleted

## 2022-01-04 NOTE — Telephone Encounter (Signed)
Attempted to call patient regarding upcoming cardiac CT appointment. °Left message on voicemail with name and callback number ° °Javoris Star RN Navigator Cardiac Imaging °[facility] °[phone] Office °[phone] Cell ° °

## 2022-01-05 ENCOUNTER — Ambulatory Visit (HOSPITAL_COMMUNITY)
Admission: RE | Admit: 2022-01-05 | Discharge: 2022-01-05 | Disposition: A | Discharge status: Home / Self Care | Payer: Medicare Other | Source: Ambulatory Visit | Attending: Cardiology | Admitting: Cardiology

## 2022-01-05 DIAGNOSIS — I2510 Atherosclerotic heart disease of native coronary artery without angina pectoris: Secondary | ICD-10-CM | POA: Insufficient documentation

## 2022-01-05 DIAGNOSIS — R079 Chest pain, unspecified: Secondary | ICD-10-CM | POA: Insufficient documentation

## 2022-01-05 DIAGNOSIS — I700 Atherosclerosis of aorta: Secondary | ICD-10-CM | POA: Diagnosis not present

## 2022-01-05 MED ORDER — METOPROLOL TARTRATE 5 MG/5ML IV SOLN
INTRAVENOUS | Status: AC
  Filled 2022-01-05: qty 10

## 2022-01-05 MED ORDER — DILTIAZEM HCL 25 MG/5ML IV SOLN: 10.0000 mg | Freq: Once | INTRAVENOUS | Status: AC

## 2022-01-05 MED ORDER — DILTIAZEM HCL 25 MG/5ML IV SOLN
INTRAVENOUS | Status: AC
  Administered 2022-01-05: 10 mg via INTRAVENOUS
  Filled 2022-01-05: qty 5

## 2022-01-05 MED ORDER — NITROGLYCERIN 0.4 MG SL SUBL
SUBLINGUAL_TABLET | SUBLINGUAL | Status: AC
  Filled 2022-01-05: qty 2

## 2022-01-05 MED ORDER — NITROGLYCERIN 0.4 MG SL SUBL
0.8000 mg | SUBLINGUAL_TABLET | Freq: Once | SUBLINGUAL | Status: AC
  Administered 2022-01-05: 0.8 mg via SUBLINGUAL

## 2022-01-05 MED ORDER — IOHEXOL 350 MG/ML SOLN
95.0000 mL | Freq: Once | INTRAVENOUS | Status: AC | PRN
  Administered 2022-01-05: 95 mL via INTRAVENOUS

## 2022-01-05 MED ORDER — METOPROLOL TARTRATE 5 MG/5ML IV SOLN
10.0000 mg | INTRAVENOUS | Status: DC | PRN
  Administered 2022-01-05: 10 mg via INTRAVENOUS

## 2022-01-06 ENCOUNTER — Telehealth (HOSPITAL_BASED_OUTPATIENT_CLINIC_OR_DEPARTMENT_OTHER): Payer: Self-pay | Admitting: *Deleted

## 2022-01-06 DIAGNOSIS — E119 Type 2 diabetes mellitus without complications: Secondary | ICD-10-CM

## 2022-01-06 DIAGNOSIS — I10 Essential (primary) hypertension: Secondary | ICD-10-CM

## 2022-01-06 DIAGNOSIS — Z13220 Encounter for screening for lipoid disorders: Secondary | ICD-10-CM

## 2022-01-06 NOTE — Telephone Encounter (Signed)
-----   Message from Buford Dresser, MD sent at 01/06/2022  8:10 AM EST ----- CT shows minimal narrowing, only one area of mild plaque in a branch vessel. This plus the calcium score means there is evidence of early plaque, but there is no severe disease that would be the reason for chest pain. I'd be happy to go over the images themselves and talk about options for more aggressive prevention at a visit if she is interested.

## 2022-01-06 NOTE — Telephone Encounter (Signed)
Discussed with patient   Scheduled follow up visit to discuss  LP/CMETl ordered for patient to have prior to visit

## 2022-01-11 ENCOUNTER — Other Ambulatory Visit: Payer: Self-pay | Admitting: Family Medicine

## 2022-01-17 ENCOUNTER — Other Ambulatory Visit: Payer: Self-pay | Admitting: Family Medicine

## 2022-01-19 ENCOUNTER — Ambulatory Visit (INDEPENDENT_AMBULATORY_CARE_PROVIDER_SITE_OTHER): Payer: Medicare Other | Admitting: Family Medicine

## 2022-01-19 ENCOUNTER — Encounter: Payer: Self-pay | Admitting: Family Medicine

## 2022-01-19 VITALS — BP 116/82 | HR 80 | Temp 98.5°F | Ht 64.25 in | Wt 183.0 lb

## 2022-01-19 DIAGNOSIS — E782 Mixed hyperlipidemia: Secondary | ICD-10-CM

## 2022-01-19 DIAGNOSIS — R0789 Other chest pain: Secondary | ICD-10-CM | POA: Diagnosis not present

## 2022-01-19 DIAGNOSIS — D509 Iron deficiency anemia, unspecified: Secondary | ICD-10-CM

## 2022-01-19 NOTE — Assessment & Plan Note (Signed)
Did not start the crestor 5 mg daily due to fear of side effects, will check new lipid panel and CMP today.

## 2022-01-19 NOTE — Assessment & Plan Note (Signed)
Patient had a CT coronary which did not reveal a cause of her chest pain. We discussed that it might be esophageal spasms possibly caused by acid reflux. I advised she start pepcid AC 20 mg once daily OTC to see if this will help with the pain she is having.

## 2022-01-19 NOTE — Patient Instructions (Signed)
Pepcid AC 1 tablet daily, ok to increase to twice a day if needed

## 2022-01-19 NOTE — Assessment & Plan Note (Signed)
History of iron deficiency in the past, will recheck iron panels today to see if her replacement needs adjusted.

## 2022-01-19 NOTE — Progress Notes (Signed)
Established Patient Office Visit  Subjective   Patient ID: Megan [name], female    DOB: 05/30/1955  Age: 67 y.o. MRN: [id number]  Chief Complaint  Patient presents with   Follow-up    Patient states she is here for a follow up from the cardiology visit, feels frustrated that no one can seem to find out what is causing her problems, states the cardiologist told her they could not find a problem with her heart and was told her cholesterol was elevated    Patient is here to discuss her CT of her coronaries and her chest pain symptoms. She reports that the cardiologist told her that the cause of her chest pain wasn't her heart. We reviewed the CT results together and I explained them in detail to the patient. We discussed other causes of non cardiac chest pain and I recommended starting a once daily antacid to see if this will help with her pain. Pt states that she was told she needed a statin medication due to her diagnosis of DM and she needed a new lipid panel done. Pt reports she has not taken the crestor 5 mg due to the risk of side effects.  Iron deficiency anemia - I reviewed her labs. Her CBC from November showed a slight decrease in Hb 11.6 and a low MCV.  Pt states she is on vitamin C packets every morning and there is iron in her daily multivitamin.    Current Outpatient Medications  Medication Instructions   ALPRAZolam (XANAX) 0.5 MG tablet Take 60 minutes before CT scan   amLODipine (NORVASC) 10 MG tablet TAKE 1 TABLET(10 MG) BY MOUTH DAILY   b complex vitamins tablet 1 tablet, Oral, Daily   Cholecalciferol (VITAMIN D3) 250 MCG (10000 UT) TABS Oral, Daily   Fish Oil-Cholecalciferol (FISH OIL + D3 PO) 1,200 mg, Oral, Daily   fluticasone (FLONASE) 50 MCG/ACT nasal spray SHAKE LIQUID AND USE 2 SPRAYS IN EACH NOSTRIL DAILY   glimepiride (AMARYL) 1 mg, Oral, Daily with breakfast   loratadine (CLARITIN) 10 mg, Oral, Daily   losartan (COZAAR) 100 MG tablet TK 1 T PO D   metoprolol  tartrate (LOPRESSOR) 50 MG tablet TAKE 1 TABLET 2 HOURS PRIOR TO CT   Multiple Vitamin (MULTIVITAMIN) tablet 1 tablet, Oral, Daily, OVER 50 VITAMIN   rosuvastatin (CRESTOR) 5 mg, Oral, Daily   sitaGLIPtin-metformin (JANUMET) 50-1000 MG tablet 1 tablet, Oral, Daily     Patient Active Problem List   Diagnosis Date Noted   Atypical chest pain 01/19/2022   DDD (degenerative disc disease), lumbar 08/10/2021   Low back pain with radiation 12/21/2020   Preventative health care 12/21/2020   At risk for cardiovascular event 11/23/2020   Type 2 diabetes mellitus without complication, without long-term current use of insulin ([place]) 11/23/2020   Multinodular goiter 11/23/2020   Generalized abdominal pain 02/19/2020   History of hernia repair 02/19/2020   Hair loss 02/17/2020   Controlled diabetes mellitus type 2 with complications ([place]) 81/27/5170   Diabetes mellitus without complication ([place]) 01/74/9449   Type 2 diabetes mellitus with hyperglycemia, without long-term current use of insulin ([place]) 09/02/2019   Dyslipidemia 09/02/2019   Uncontrolled type 2 diabetes mellitus with hyperglycemia ([place]) 11/05/2018   Fatigue 11/05/2018   Acute non-recurrent pansinusitis 04/25/2018   Bronchitis 05/21/2017   CAP (community acquired pneumonia) 10/15/2013   Wheezing 09/29/2013   Pain, knee 10/02/2012   Gastrocnemius muscle strain 10/02/2012   Wrist sprain 10/02/2012   Thyroid nodule  05/03/2012   Rash and nonspecific skin eruption 03/14/2012   HAIR LOSS 03/28/2010   COLONIC POLYPS, HX OF 11/09/2009   HEMANGIOMA, HEPATIC 11/04/2009   UNSPECIFIED ANEMIA 11/04/2009   DIVERTICULOSIS, COLON 11/04/2009   FATTY LIVER DISEASE 11/04/2009   PALPITATIONS 09/06/2009   Chronic fatigue 08/07/2008   Allergic rhinitis 04/10/2007   Diabetes mellitus type II, controlled ([place]) 12/21/2006   Hyperlipidemia 04/12/2006   HTN (hypertension) 04/12/2006      Review of Systems  All other systems reviewed and are  negative.     Objective:     BP 116/82 (BP Location: Left Arm, Patient Position: Sitting, Cuff Size: Large)   Pulse 80   Temp 98.5 F (36.9 C) (Oral)   Ht 5' 4.25" (1.632 m)   Wt 183 lb (83 kg)   SpO2 98%   BMI 31.17 kg/m    Physical Exam Vitals reviewed.  Constitutional:      Appearance: Normal appearance. She is well-groomed and normal weight.  Eyes:     Conjunctiva/sclera: Conjunctivae normal.  Cardiovascular:     Rate and Rhythm: Normal rate and regular rhythm.     Pulses: Normal pulses.     Heart sounds: S1 normal and S2 normal.  Pulmonary:     Effort: Pulmonary effort is normal.     Breath sounds: Normal breath sounds and air entry.  Abdominal:     General: Bowel sounds are normal.  Musculoskeletal:     Right lower leg: No edema.     Left lower leg: No edema.  Neurological:     Mental Status: She is alert and oriented to person, place, and time. Mental status is at baseline.     Gait: Gait is intact.  Psychiatric:        Mood and Affect: Mood and affect normal.        Speech: Speech normal.        Behavior: Behavior normal.        Judgment: Judgment normal.      No results found for any visits on 01/19/22.    The 10-year ASCVD risk score (Arnett DK, et al., 2019) is: 17.4%    Assessment & Plan:   Problem List Items Addressed This Visit       Unprioritized   Hyperlipidemia    Did not start the crestor 5 mg daily due to fear of side effects, will check new lipid panel and CMP today.      Relevant Orders   Lipid Panel   CMP   UNSPECIFIED ANEMIA    History of iron deficiency in the past, will recheck iron panels today to see if her replacement needs adjusted.      Relevant Orders   Iron, TIBC and Ferritin Panel   Atypical chest pain - Primary (Chronic)    Patient had a CT coronary which did not reveal a cause of her chest pain. We discussed that it might be esophageal spasms possibly caused by acid reflux. I advised she start pepcid AC 20  mg once daily OTC to see if this will help with the pain she is having.        No follow-ups on file.    Farrel Conners, MD

## 2022-01-20 ENCOUNTER — Other Ambulatory Visit (INDEPENDENT_AMBULATORY_CARE_PROVIDER_SITE_OTHER): Payer: Medicare Other

## 2022-01-20 DIAGNOSIS — E782 Mixed hyperlipidemia: Secondary | ICD-10-CM | POA: Diagnosis not present

## 2022-01-20 DIAGNOSIS — D509 Iron deficiency anemia, unspecified: Secondary | ICD-10-CM | POA: Diagnosis not present

## 2022-01-20 LAB — COMPREHENSIVE METABOLIC PANEL
ALT: 18 U/L (ref 0–35)
AST: 19 U/L (ref 0–37)
Albumin: 4.1 g/dL (ref 3.5–5.2)
Alkaline Phosphatase: 58 U/L (ref 39–117)
BUN: 13 mg/dL (ref 6–23)
CO2: 28 mEq/L (ref 19–32)
Calcium: 9.4 mg/dL (ref 8.4–10.5)
Chloride: 104 mEq/L (ref 96–112)
Creatinine, Ser: 0.77 mg/dL (ref 0.40–1.20)
GFR: 79.03 mL/min (ref >60.00)
Glucose, Bld: 133 mg/dL — ABNORMAL HIGH (ref 70–99)
Potassium: 3.9 mEq/L (ref 3.5–5.1)
Sodium: 140 mEq/L (ref 135–145)
Total Bilirubin: 0.8 mg/dL (ref 0.2–1.2)
Total Protein: 7.6 g/dL (ref 6.0–8.3)

## 2022-01-20 LAB — LIPID PANEL
Cholesterol: 167 mg/dL (ref 0–200)
HDL: 62.6 mg/dL (ref >39.00)
LDL Cholesterol: 77 mg/dL (ref 0–99)
NonHDL: 104.04
Total CHOL/HDL Ratio: 3
Triglycerides: 137 mg/dL (ref 0.0–149.0)
VLDL: 27.4 mg/dL (ref 0.0–40.0)

## 2022-01-21 LAB — IRON,TIBC AND FERRITIN PANEL
%SAT: 33 % (calc) (ref 16–45)
Ferritin: 78 ng/mL (ref 16–288)
Iron: 88 ug/dL (ref 45–160)
TIBC: 268 mcg/dL (calc) (ref 250–450)

## 2022-01-23 NOTE — Progress Notes (Signed)
Labs are relatively normal, iron panel is normal, cholesterol is good. Her blood sugar was slightly elevated but this is expected

## 2022-01-25 ENCOUNTER — Ambulatory Visit (HOSPITAL_BASED_OUTPATIENT_CLINIC_OR_DEPARTMENT_OTHER): Payer: Medicare Other | Admitting: Cardiology

## 2022-02-01 LAB — HM DEXA SCAN: HM Dexa Scan: NORMAL

## 2022-02-10 ENCOUNTER — Ambulatory Visit: Payer: Medicare Other | Admitting: Family Medicine

## 2022-02-28 DIAGNOSIS — D2371 Other benign neoplasm of skin of right lower limb, including hip: Secondary | ICD-10-CM | POA: Diagnosis not present

## 2022-02-28 DIAGNOSIS — E1151 Type 2 diabetes mellitus with diabetic peripheral angiopathy without gangrene: Secondary | ICD-10-CM | POA: Diagnosis not present

## 2022-02-28 DIAGNOSIS — M19071 Primary osteoarthritis, right ankle and foot: Secondary | ICD-10-CM | POA: Diagnosis not present

## 2022-02-28 DIAGNOSIS — M19072 Primary osteoarthritis, left ankle and foot: Secondary | ICD-10-CM | POA: Diagnosis not present

## 2022-03-21 DIAGNOSIS — N181 Chronic kidney disease, stage 1: Secondary | ICD-10-CM | POA: Diagnosis not present

## 2022-03-21 DIAGNOSIS — R3129 Other microscopic hematuria: Secondary | ICD-10-CM | POA: Diagnosis not present

## 2022-03-21 DIAGNOSIS — E1122 Type 2 diabetes mellitus with diabetic chronic kidney disease: Secondary | ICD-10-CM | POA: Diagnosis not present

## 2022-03-21 DIAGNOSIS — I129 Hypertensive chronic kidney disease with stage 1 through stage 4 chronic kidney disease, or unspecified chronic kidney disease: Secondary | ICD-10-CM | POA: Diagnosis not present

## 2022-03-21 DIAGNOSIS — R809 Proteinuria, unspecified: Secondary | ICD-10-CM | POA: Diagnosis not present

## 2022-05-08 DIAGNOSIS — D2371 Other benign neoplasm of skin of right lower limb, including hip: Secondary | ICD-10-CM | POA: Diagnosis not present

## 2022-05-08 DIAGNOSIS — M205X1 Other deformities of toe(s) (acquired), right foot: Secondary | ICD-10-CM | POA: Diagnosis not present

## 2022-05-08 DIAGNOSIS — M19072 Primary osteoarthritis, left ankle and foot: Secondary | ICD-10-CM | POA: Diagnosis not present

## 2022-05-08 DIAGNOSIS — E1151 Type 2 diabetes mellitus with diabetic peripheral angiopathy without gangrene: Secondary | ICD-10-CM | POA: Diagnosis not present

## 2022-05-08 DIAGNOSIS — M19071 Primary osteoarthritis, right ankle and foot: Secondary | ICD-10-CM | POA: Diagnosis not present

## 2022-05-20 ENCOUNTER — Other Ambulatory Visit: Payer: Self-pay | Admitting: Family Medicine

## 2022-05-29 ENCOUNTER — Encounter: Payer: Self-pay | Admitting: Internal Medicine

## 2022-05-29 ENCOUNTER — Ambulatory Visit: Payer: Medicare Other | Admitting: Internal Medicine

## 2022-05-29 VITALS — BP 120/80 | HR 70 | Ht 64.25 in | Wt 179.0 lb

## 2022-05-29 DIAGNOSIS — E119 Type 2 diabetes mellitus without complications: Secondary | ICD-10-CM

## 2022-05-29 DIAGNOSIS — E042 Nontoxic multinodular goiter: Secondary | ICD-10-CM

## 2022-05-29 DIAGNOSIS — Z7984 Long term (current) use of oral hypoglycemic drugs: Secondary | ICD-10-CM | POA: Diagnosis not present

## 2022-05-29 LAB — POCT GLYCOSYLATED HEMOGLOBIN (HGB A1C): Hemoglobin A1C: 6.3 % — AB (ref 4.0–5.6)

## 2022-05-29 MED ORDER — GLIMEPIRIDE 1 MG PO TABS: 1.0000 mg | ORAL_TABLET | Freq: Every day | ORAL | 3 refills | Status: DC

## 2022-05-29 MED ORDER — JANUMET 50-1000 MG PO TABS: 1.0000 | ORAL_TABLET | Freq: Every day | ORAL | 3 refills | Status: DC

## 2022-05-29 NOTE — Progress Notes (Signed)
Name: Megan [name]  Age/ Sex: 67 y.o., female   MRN/ DOB: [id number], 1955-08-03     PCP: Karie Georges, MD   Reason for Endocrinology Evaluation: Type 2 Diabetes Mellitus  Initial Endocrine Consultative Visit: 09/02/2019    PATIENT IDENTIFIER: [name] is a 67 y.o. female with a past medical history of T2DM, HTN and Dyslipidemia. The patient has followed with Endocrinology clinic since 09/02/2019 for consultative assistance with management of her diabetes.  DIABETIC HISTORY:  Ms. Casasanta was diagnosed with DM in 2008.She has been on Metformin, Glimepiride, repaglinide and Januvia . Her hemoglobin A1c has ranged from 6.5% in 2014, peaking at 7.4% in 10/2018  On her initial visit to our clinic she has an A1c of 7.3% . She was on Repaglinide which we stopped due to hypoglycemia, Decreased Glimepiride and continued Janumet.   SUBJECTIVE:   During the last visit (11/28/2021): A1c 6.3 %     Today (05/29/2022): [name] is here for a follow up on diabetes management.  She checks her blood sugars 1 times daily. The patient has not had hypoglycemic episodes since the last clinic visit.   She had an ED visit for chest pain 11/2021, she was followed up by cardiology, CT coronary showed mild CAD and proximal ramus intermedius, aortic atherosclerosis, normal coronary origin with right dominance  She was offered statin therapy but declined due to skepticism about side effects , she is on fish oil and LDL 77 mg/dL   She has been having difficulty maintaining sleep at night  She has noted weight gain in January and has been exercising at the [employer] with weight loss  [facility] nurse paid her a home visit and was advised to lose ~ 20 lbs  Denies locak neck swelling  Denies nausea , vomiting or diarrhea     HOME DIABETES REGIMEN:  Janumet 50-1000 mg daily  Glimepiride 1 mg , 1 tabs daily     Statin: yes  ACE-I/ARB: yes    METER DOWNLOAD SUMMARY: Unable to download   90 day average 163 mg/dL   DIABETIC COMPLICATIONS: Microvascular complications:   Denies: CKD, retinopathy, neuropathy Last Eye Exam: Completed 06/24/2021    Macrovascular complications:   Denies: CAD, CVA, PVD   HISTORY:  Past Medical History:  Past Medical History:  Diagnosis Date   Allergy    Arthritis    fingers   Colon polyp    Diabetes mellitus    Diverticulosis    GERD (gastroesophageal reflux disease)    occasionally   H/O hiatal hernia    ?   Hemorrhoids    Hyperlipidemia    takes fish oil   Hypertension    Infectious colitis    Ventral hernia    Past Surgical History:  Past Surgical History:  Procedure Laterality Date   colonscopy     DIAGNOSTIC LAPAROSCOPY     FOOT SURGERY Bilateral    hammer toes   HEMORRHOID SURGERY     HERNIA REPAIR  01/20/2011   incisional   INCISIONAL HERNIA REPAIR  01/20/2011   Procedure: LAPAROSCOPIC INCISIONAL HERNIA;  Surgeon: Rulon Abide, DO;  Location: [facility];  Service: General;  Laterality: N/A;  Laparoscopic repair of a recurrent incisional hernia with Mesh   INGUINAL HERNIA REPAIR     x's 3   VENTRAL HERNIA REPAIR     Social History:  reports that she quit smoking about 26 years ago. Her smoking use included cigarettes. She  has a 20.00 pack-year smoking history. She has never used smokeless tobacco. She reports current alcohol use of about 4.0 standard drinks of alcohol per week. She reports that she does not use drugs. Family History:  Family History  Problem Relation Age of Onset   Breast cancer Mother    Diabetes Mother    Dementia Mother    Liver cancer Mother    Heart disease Father        heart attack   Diabetes Brother    Breast cancer Sister    Colon cancer Neg Hx    Stomach cancer Neg Hx      HOME MEDICATIONS: Allergies as of 05/29/2022       Reactions   Aspirin Nausea Only        Medication List        Accurate as of May 29, 2022  7:40 AM. If you have any questions, ask your nurse  or doctor.          ALPRAZolam 0.5 MG tablet Commonly known as: Xanax Take 60 minutes before CT scan   amLODipine 10 MG tablet Commonly known as: NORVASC TAKE 1 TABLET(10 MG) BY MOUTH DAILY   b complex vitamins tablet Take 1 tablet by mouth daily.   FISH OIL + D3 PO Take 1,200 mg by mouth daily.   fluticasone 50 MCG/ACT nasal spray Commonly known as: FLONASE SHAKE LIQUID AND USE 2 SPRAYS IN EACH NOSTRIL DAILY   glimepiride 1 MG tablet Commonly known as: AMARYL Take 1 tablet (1 mg total) by mouth daily with breakfast.   Janumet 50-1000 MG tablet Generic drug: sitaGLIPtin-metformin Take 1 tablet by mouth daily at 6 (six) AM.   loratadine 10 MG tablet Commonly known as: CLARITIN Take 1 tablet (10 mg total) by mouth daily.   losartan 100 MG tablet Commonly known as: COZAAR TAKE 1 TABLET BY MOUTH EVERY DAY   losartan 100 MG tablet Commonly known as: COZAAR TAKE 1 TABLET BY MOUTH EVERY DAY   metoprolol tartrate 50 MG tablet Commonly known as: LOPRESSOR TAKE 1 TABLET 2 HOURS PRIOR TO CT   multivitamin tablet Take 1 tablet by mouth daily. OVER 50 VITAMIN   rosuvastatin 5 MG tablet Commonly known as: Crestor Take 1 tablet (5 mg total) by mouth daily.   Vitamin D3 250 MCG (10000 UT) Tabs Take by mouth daily.         OBJECTIVE:   Vital Signs: BP 120/80 (BP Location: Left Arm, Patient Position: Sitting, Cuff Size: Large)   Pulse 70   Ht 5' 4.25" (1.632 m)   Wt 179 lb (81.2 kg)   SpO2 92%   BMI 30.49 kg/m   Wt Readings from Last 3 Encounters:  05/29/22 179 lb (81.2 kg)  01/19/22 183 lb (83 kg)  12/23/21 175 lb 1.6 oz (79.4 kg)     Exam: General: Pt appears well and is in NAD  Neck: General: Supple without adenopathy. Thyroid: Right thyroid fullness   Lungs: Clear with good BS bilat   Heart: RRR   Extremities: No pretibial edema.  Neuro: MS is good with appropriate affect, pt is alert and Ox3    DM foot exam  05/29/2022 The skin of the feet  is intact without sores or ulcerations. The pedal pulses are 2+ on right and 2+ on left. The sensation is intact to a screening 5.07, 10 gram monofilament bilaterally     DATA REVIEWED:  Lab Results  Component Value Date   HGBA1C  6.3 (A) 05/29/2022   HGBA1C 6.3 (A) 11/28/2021   HGBA1C 6.2 (A) 08/09/2021    Latest Reference Range & Units 01/20/22 08:11  Sodium 135 - 145 mEq/L 140  Potassium 3.5 - 5.1 mEq/L 3.9  Chloride 96 - 112 mEq/L 104  CO2 19 - 32 mEq/L 28  Glucose 70 - 99 mg/dL 829 (H)  BUN 6 - 23 mg/dL 13  Creatinine 5.62 - 1.30 mg/dL 8.65  Calcium 8.4 - 78.4 mg/dL 9.4  Alkaline Phosphatase 39 - 117 U/L 58  Albumin 3.5 - 5.2 g/dL 4.1  AST 0 - 37 U/L 19  ALT 0 - 35 U/L 18  Total Protein 6.0 - 8.3 g/dL 7.6  Total Bilirubin 0.2 - 1.2 mg/dL 0.8  GFR >69.62 mL/min 79.03     Latest Reference Range & Units 01/20/22 08:11  Total CHOL/HDL Ratio  3  Cholesterol 0 - 200 mg/dL 952  HDL Cholesterol >84.13 mg/dL 24.40  LDL (calc) 0 - 99 mg/dL 77  NonHDL  102.72  Triglycerides 0.0 - 149.0 mg/dL 536.6  VLDL 0.0 - 44.0 mg/dL 34.7  Iron 45 - 425 mcg/dL 88  TIBC 956 - 387 mcg/dL (calc) 564  %SAT 16 - 45 % (calc) 33  Ferritin 16 - 288 ng/mL 78     Thyroid ultrasound 05/30/2018   Estimated total number of nodules >/= 1 cm: 2   Number of spongiform nodules >/=  2 cm not described below (TR1): 0   Number of mixed cystic and solid nodules >/= 1.5 cm not described below (TR2): 0   _________________________________________________________   The previously biopsied mass in the right mid and lower gland measures 5.6 x 4.0 x 2.6 cm. This appears grossly stable compared to prior although measurements were obtained in slightly different planes and so precise measurements are more challenging. However, the mass measured up to 5.2 cm in June of 2015. There has been no significant mitral change over the past 5 years.   Stable appearance of a multiple subcentimeter nodules  scattered throughout the left mid gland. None of the nodules meet criteria for biopsy or further imaging surveillance.   IMPRESSION: No significant interval change in the size or appearance of the previously biopsied nodule occupying the majority of the right gland. Five year stability is consistent with benignity.   Multiple small nodules scattered throughout the left mid gland remain unchanged and do not meet criteria for further evaluation.   The above is in keeping with the ACR TI-RADS recommendations - J Am Coll Radiol 2017;14:587-595.    Right Nodule FNA 05/16/2012  THYROID, FINE NEEDLE ASPIRATION, RIGHT BENIGN. FINDINGS CONSISTENT WITH NON-NEOPLASTIC GOITER.    Old records , labs and images have been reviewed.    ASSESSMENT / PLAN / RECOMMENDATIONS:   1) Type 2 Diabetes Mellitus, Optimally controlled, Without complications - Most recent A1c of 6.3 %. Goal A1c < 7.0 %.    - A1c remains optimal  - Pt opted to reduce Janumet in the past rather than Glimpeiride due to cost  - Today I offered to switch Januvia to Rybelsus for weight loss beenfits but her goal is to reduce meds and does not want to add new medication     MEDICATIONS: - Continue Glimepiride 1 mg, one  tablet before Breakfast  - Continue  Janumet 50-1000 mg ONCE daily   EDUCATION / INSTRUCTIONS: BG monitoring instructions: Patient is instructed to check her blood sugars 1 times a day, fasting . Call [facility] if:  BG persistently < 70 I reviewed the Rule of 15 for the treatment of hypoglycemia in detail with the patient. Literature supplied.   2) Diabetic complications:  Eye: Does not have known diabetic retinopathy.  Neuro/ Feet: Does not have known diabetic peripheral neuropathy .  Renal: Patient does not have known baseline CKD. She   is on an ACEI/ARB at present.     3) Multinodular Goiter:  - She has been diagnosed with MNG in 2014. She is S/P benign FNA of the right nodule  in 2014 with benign cytology. Ultrasound in 2020 confirmed 5 year stability  - At this time clinical monitoring is recommended  -  She is clinically euthyroid  - No local neck symptoms  - TFT's are normal    4) Insomnia:   -Discussed sleep hygiene  -We discussed natural remedies such as lavender spray   F/U in 6 months    Signed electronically by: Lyndle Herrlich, MD  [facility]  [facility] [address] [place] [place], [place] [address] Phone: [phone] FAX: [phone]   CC: Karie Georges, MD [address] [place] [address] Phone: [phone]  Fax: [phone]  Return to Endocrinology clinic as below: Future Appointments  Date Time Provider Department Center  05/29/2022  7:50 AM Lael Pilch, Konrad Dolores, MD [id number] None  06/26/2022  2:30 PM Karie Georges, MD [facility]

## 2022-05-29 NOTE — Patient Instructions (Signed)
-   Continue Glimepiride 1 mg, one  tablet before Breakfast  - Continue Janumet 50-1000 mg ONCE daily      HOW TO TREAT LOW BLOOD SUGARS (Blood sugar LESS THAN 70 MG/DL) Please follow the RULE OF 15 for the treatment of hypoglycemia treatment (when your (blood sugars are less than 70 mg/dL)   STEP 1: Take 15 grams of carbohydrates when your blood sugar is low, which includes:  3-4 GLUCOSE TABS  OR 3-4 OZ OF JUICE OR REGULAR SODA OR ONE TUBE OF GLUCOSE GEL    STEP 2: RECHECK blood sugar in 15 MINUTES STEP 3: If your blood sugar is still low at the 15 minute recheck --> then, go back to STEP 1 and treat AGAIN with another 15 grams of carbohydrates.

## 2022-06-02 ENCOUNTER — Other Ambulatory Visit: Payer: Self-pay | Admitting: Family Medicine

## 2022-06-02 DIAGNOSIS — R0981 Nasal congestion: Secondary | ICD-10-CM

## 2022-06-07 ENCOUNTER — Telehealth: Payer: Self-pay | Admitting: *Deleted

## 2022-06-07 DIAGNOSIS — R0981 Nasal congestion: Secondary | ICD-10-CM

## 2022-06-07 NOTE — Telephone Encounter (Signed)
Patient requested a refill on Flonase nasal spray be sent to [facility] on [address] (previously given by Dr Laury Axon.  Message sent to PCP.

## 2022-06-07 NOTE — Telephone Encounter (Signed)
Patient was noted on a list given to me by clinical supervisor for lack of recent mammogram results.  Spoke with the patient and she stated the last mammogram was performed at her GYN office-Dr Marcelle Overlie and is aware the request will be sent via fax.

## 2022-06-08 ENCOUNTER — Encounter: Payer: Self-pay | Admitting: Family Medicine

## 2022-06-08 MED ORDER — FLUTICASONE PROPIONATE 50 MCG/ACT NA SUSP: NASAL | 5 refills | Status: DC

## 2022-06-08 NOTE — Telephone Encounter (Signed)
Script sent  

## 2022-06-26 ENCOUNTER — Ambulatory Visit (INDEPENDENT_AMBULATORY_CARE_PROVIDER_SITE_OTHER): Payer: Medicare Other | Admitting: Family Medicine

## 2022-06-26 ENCOUNTER — Encounter: Payer: Self-pay | Admitting: Family Medicine

## 2022-06-26 VITALS — BP 102/70 | HR 81 | Temp 98.6°F | Ht 64.25 in | Wt 180.4 lb

## 2022-06-26 DIAGNOSIS — J301 Allergic rhinitis due to pollen: Secondary | ICD-10-CM

## 2022-06-26 MED ORDER — MONTELUKAST SODIUM 10 MG PO TABS
10.0000 mg | ORAL_TABLET | Freq: Every day | ORAL | 3 refills | Status: DC
Start: 2022-06-26 — End: 2022-11-17

## 2022-06-26 NOTE — Progress Notes (Unsigned)
f  Established Patient Office Visit  Subjective   Patient ID: Megan [name], female    DOB: [birth year]  Age: 67 y.o. MRN: [id number]  Chief Complaint  Patient presents with   Medical Management of Chronic Issues   Cough    Productive with creamy colored sputum x years    Patient is reporting a chronic cough, dry, feels like there is something stuck in her throat/ upper chest. States that she has tried multiple medications, is on flonase and antihistamines, no SOB, no tightness in her chest. States that it sometimes wakes up in the night with the cough as well. States that it has been going on for years, she had a CT coronary in December and the over-read showed normal lungs-- no acute findings. Pt states that she doesn't think that it is coming from her lungs, just her sinuses. She has tried antacids in the past without any improvement.   Cough This is a chronic problem. The current episode started more than 1 year ago. The problem has been unchanged.    Current Outpatient Medications  Medication Instructions   amLODipine (NORVASC) 10 MG tablet TAKE 1 TABLET(10 MG) BY MOUTH DAILY   b complex vitamins tablet 1 tablet, Oral, Daily   Cholecalciferol (VITAMIN D3) 250 MCG (10000 UT) TABS Oral, Daily   Fish Oil-Cholecalciferol (FISH OIL + D3 PO) 1,200 mg, Oral, Daily   fluticasone (FLONASE) 50 MCG/ACT nasal spray SHAKE LIQUID AND USE 2 SPRAYS IN EACH NOSTRIL DAILY   glimepiride (AMARYL) 1 mg, Oral, Daily with breakfast   loratadine (CLARITIN) 10 mg, Oral, Daily   losartan (COZAAR) 100 MG tablet TAKE 1 TABLET BY MOUTH EVERY DAY   montelukast (SINGULAIR) 10 mg, Oral, Daily at bedtime   Multiple Vitamin (MULTIVITAMIN) tablet 1 tablet, Oral, Daily, OVER 50 VITAMIN   sitaGLIPtin-metformin (JANUMET) 50-1000 MG tablet 1 tablet, Oral, Daily    Patient Active Problem List   Diagnosis Date Noted   Atypical chest pain 01/19/2022   DDD (degenerative disc disease), lumbar 08/10/2021   Low back  pain with radiation 12/21/2020   Preventative health care 12/21/2020   At risk for cardiovascular event 11/23/2020   Type 2 diabetes mellitus without complication, without long-term current use of insulin (HCC) 11/23/2020   Multinodular goiter 11/23/2020   Generalized abdominal pain 02/19/2020   History of hernia repair 02/19/2020   Hair loss 02/17/2020   Controlled diabetes mellitus type 2 with complications (HCC) 02/17/2020   Diabetes mellitus without complication (HCC) 11/11/2019   Type 2 diabetes mellitus with hyperglycemia, without long-term current use of insulin (HCC) 09/02/2019   Dyslipidemia 09/02/2019   Uncontrolled type 2 diabetes mellitus with hyperglycemia (HCC) 11/05/2018   Fatigue 11/05/2018   Acute non-recurrent pansinusitis 04/25/2018   Bronchitis 05/21/2017   CAP (community acquired pneumonia) 10/15/2013   Wheezing 09/29/2013   Pain, knee 10/02/2012   Gastrocnemius muscle strain 10/02/2012   Wrist sprain 10/02/2012   Thyroid nodule 05/03/2012   Rash and nonspecific skin eruption 03/14/2012   HAIR LOSS 03/28/2010   COLONIC POLYPS, HX OF 11/09/2009   HEMANGIOMA, HEPATIC 11/04/2009   UNSPECIFIED ANEMIA 11/04/2009   DIVERTICULOSIS, COLON 11/04/2009   FATTY LIVER DISEASE 11/04/2009   PALPITATIONS 09/06/2009   Chronic fatigue 08/07/2008   Allergic rhinitis 04/10/2007   Diabetes mellitus type II, controlled (HCC) 12/21/2006   Hyperlipidemia 04/12/2006   HTN (hypertension) 04/12/2006      Review of Systems  Respiratory:  Positive for cough.   All other systems reviewed  and are negative.     Objective:     BP 102/70 (BP Location: Left Arm, Patient Position: Sitting, Cuff Size: Large)   Pulse 81   Temp 98.6 F (37 C) (Oral)   Ht 5' 4.25" (1.632 m)   Wt 180 lb 6.4 oz (81.8 kg)   SpO2 99%   BMI 30.73 kg/m    Physical Exam Vitals reviewed.  Constitutional:      Appearance: Normal appearance. She is well-groomed and normal weight.  HENT:      Mouth/Throat:     Mouth: Mucous membranes are moist.     Pharynx: No posterior oropharyngeal erythema.  Eyes:     Conjunctiva/sclera: Conjunctivae normal.  Neck:     Thyroid: No thyromegaly.  Cardiovascular:     Rate and Rhythm: Normal rate and regular rhythm.     Pulses: Normal pulses.     Heart sounds: S1 normal and S2 normal.  Pulmonary:     Effort: Pulmonary effort is normal.     Breath sounds: Normal breath sounds and air entry.  Abdominal:     General: Bowel sounds are normal.  Musculoskeletal:     Right lower leg: No edema.     Left lower leg: No edema.  Lymphadenopathy:     Cervical: No cervical adenopathy.  Neurological:     Mental Status: She is alert and oriented to person, place, and time. Mental status is at baseline.     Gait: Gait is intact.  Psychiatric:        Mood and Affect: Mood and affect normal.        Speech: Speech normal.        Behavior: Behavior normal.        Judgment: Judgment normal.      No results found for any visits on 06/26/22.    The 10-year ASCVD risk score (Arnett DK, et al., 2019) is: 14.2%    Assessment & Plan:  Seasonal allergic rhinitis due to pollen Assessment & Plan: Chronic, ongoing, already on flonase and daily antihistamines, will add singulair 10 mg daily to her regimen. Pt will let me know if this helps her symptoms, if not then we may consider referral to an allergist.   Orders: -     Montelukast Sodium; Take 1 tablet (10 mg total) by mouth at bedtime.  Dispense: 30 tablet; Refill: 3     Return in about 6 months (around 12/26/2022) for HTN.    Karie Georges, MD

## 2022-06-27 NOTE — Assessment & Plan Note (Signed)
Chronic, ongoing, already on flonase and daily antihistamines, will add singulair 10 mg daily to her regimen. Pt will let me know if this helps her symptoms, if not then we may consider referral to an allergist.

## 2022-07-14 ENCOUNTER — Other Ambulatory Visit: Payer: Self-pay | Admitting: Family Medicine

## 2022-08-15 ENCOUNTER — Encounter: Payer: Medicare Other | Admitting: Family Medicine

## 2022-08-15 NOTE — Progress Notes (Signed)
error 

## 2022-09-20 DIAGNOSIS — Z1231 Encounter for screening mammogram for malignant neoplasm of breast: Secondary | ICD-10-CM | POA: Diagnosis not present

## 2022-09-21 ENCOUNTER — Telehealth (INDEPENDENT_AMBULATORY_CARE_PROVIDER_SITE_OTHER): Payer: Medicare Other | Admitting: Family Medicine

## 2022-09-21 DIAGNOSIS — Z0000 Encounter for general adult medical examination without abnormal findings: Secondary | ICD-10-CM

## 2022-09-21 NOTE — Progress Notes (Signed)
PATIENT CHECK-IN and HEALTH RISK ASSESSMENT QUESTIONNAIRE:  -completed by phone/video for upcoming Medicare Preventive Visit  Pre-Visit Check-in: 1)Vitals (height, wt, BP, etc) - record in vitals section for visit on day of visit Request home vitals (wt, BP, etc.) and enter into vitals, THEN update Vital Signs SmartPhrase below at the top of the HPI. See below.  2)Review and Update Medications, Allergies PMH, Surgeries, Social history in Epic 3)Hospitalizations in the last year with date/reason? no 4)Review and Update Care Team (patient's specialists) in Epic 5) Complete PHQ9 in Epic  6) Complete Fall Screening in Epic 7)Review all Health Maintenance Due and order under PCP if not done.  8)Medicare Wellness Questionnaire: Answer theses question about your habits: Do you drink alcohol? Some on the weekends, 1- 4 glasses Have you ever smoked? Quit in '97 Do you use an illicit drugs?no Do you exercises? Yes, she went to the gym 4 days a week until summer and then stopped over the summer, but starts back on Monday Typical breakfast: apple, coffee, bowl of oatmeal Typical lunch: orange, sandwich, more fruit, used to to eat fritos but she cut out the chips Typical dinner: meat and veggies  Beverages: water, some sweet tea and cranberry  Answer theses question about you: Can you perform most household chores? yes Do you find it hard to follow a conversation in a noisy room? no Do you often ask people to speak up or repeat themselves?no Do you feel that you have a problem with memory? Some but seems normal, can forget what she came in the room for but then remembers it Do you balance your checkbook and or bank acounts?yes Do you feel safe at home? yes Last dentist visit?gets regular dental care Do you need assistance with any of the following: Please note if so  - none  Driving?  Feeding yourself?  Getting from bed to chair?  Getting to the toilet?  Bathing or showering?  Dressing  yourself?  Managing money?  Climbing a flight of stairs  Preparing meals?  Do you have Advanced Directives in place (Living Will, Healthcare Power or Attorney)? Working on this now   Last eye Exam and location? Gets eye exam yearly, last exam 4 months ago   Do you currently use prescribed or non-prescribed narcotic or opioid pain medications?no   Request home vitals (wt, BP, etc.) and enter into vitals, THEN update Vital Signs SmartPhrase below at the top of the HPI. See below.   Nurse/Assistant Credentials/time stamp:   ----------------------------------------------------------------------------------------------------------------------------------------------------------------------------------------------------------------------  Vital Signs: Unable to obtain new vitals due to this being a telehealth visit.   MEDICARE ANNUAL PREVENTIVE VISIT WITH PROVIDER: (Welcome to Medicare, initial annual wellness or annual wellness exam)  Virtual Visit via Video Note  I connected with Megan [name] on 09/21/22 by a video enabled telemedicine application and verified that I am speaking with the correct person using two identifiers.  Location patient: home Location provider:work or home office Persons participating in the virtual visit: patient, provider  Concerns and/or follow up today: suffers from allergies, gets a lot of PND, puffy eyes, allergic symptoms. As is aging has had some mild urinary urgency. Also sometimes wakes up at 2 in the morning and has trouble getting back to sleep.    See HM section in Epic for other details of completed HM.    ROS: negative for report of fevers, unintentional weight loss, vision changes, vision loss, hearing loss or change, chest pain, sob, hemoptysis, melena, hematochezia, hematuria, falls, bleeding  or bruising, thoughts of suicide or self harm, memory loss  Patient-completed extensive health risk assessment - reviewed and discussed with  the patient: See Health Risk Assessment completed with patient prior to the visit either above or in recent phone note. This was reviewed in detailed with the patient today and appropriate recommendations, orders and referrals were placed as needed per Summary below and patient instructions.   Review of Medical History: -PMH, PSH, Family History and current specialty and care providers reviewed and updated and listed below   Patient Care Team: Karie Georges, MD as PCP - General (Family Medicine) Jodelle Red, MD as PCP - Cardiology (Cardiology) Richarda Overlie, MD as Consulting Physician (Obstetrics and Gynecology) [facility], [facility]   Past Medical History:  Diagnosis Date   Allergy    Arthritis    fingers   Colon polyp    Diabetes mellitus    Diverticulosis    GERD (gastroesophageal reflux disease)    occasionally   H/O hiatal hernia    ?   Hemorrhoids    Hyperlipidemia    takes fish oil   Hypertension    Infectious colitis    Ventral hernia     Past Surgical History:  Procedure Laterality Date   colonscopy     DIAGNOSTIC LAPAROSCOPY     FOOT SURGERY Bilateral    hammer toes   HEMORRHOID SURGERY     HERNIA REPAIR  01/20/2011   incisional   INCISIONAL HERNIA REPAIR  01/20/2011   Procedure: LAPAROSCOPIC INCISIONAL HERNIA;  Surgeon: Rulon Abide, DO;  Location: [facility];  Service: General;  Laterality: N/A;  Laparoscopic repair of a recurrent incisional hernia with Mesh   INGUINAL HERNIA REPAIR     x's 3   VENTRAL HERNIA REPAIR      Social History   Socioeconomic History   Marital status: Divorced    Spouse name: Not on file   Number of children: 1   Years of education: Not on file   Highest education level: Not on file  Occupational History   Occupation: assessment specialist-retired    Comment: city of [place]  Tobacco Use   Smoking status: Former    Current packs/day: 0.00    Average packs/day: 1 pack/day for 20.0 years  (20.0 ttl pk-yrs)    Types: Cigarettes    Start date: 01/05/1976    Quit date: 01/05/1996    Years since quitting: 26.7   Smokeless tobacco: Never  Vaping Use   Vaping status: Never Used  Substance and Sexual Activity   Alcohol use: Yes    Alcohol/week: 4.0 standard drinks of alcohol    Types: 4 Glasses of wine per week    Comment: occasional wine   Drug use: No   Sexual activity: Not Currently  Other Topics Concern   Not on file  Social History Narrative   Exercise 3-4  times a week   Social Determinants of Health   Financial Resource Strain: Low Risk  (09/07/2020)   Overall Financial Resource Strain (CARDIA)    Difficulty of Paying Living Expenses: Not hard at all  Food Insecurity: No Food Insecurity (09/07/2020)   Hunger Vital Sign    Worried About Running Out of Food in the Last Year: Never true    Ran Out of Food in the Last Year: Never true  Transportation Needs: No Transportation Needs (09/07/2020)   PRAPARE - Administrator, Civil Service (Medical): No    Lack of Transportation (Non-Medical):  No  Physical Activity: Sufficiently Active (09/07/2020)   Exercise Vital Sign    Days of Exercise per Week: 7 days    Minutes of Exercise per Session: 60 min  Stress: No Stress Concern Present (09/07/2020)   [employer] of Occupational Health - Occupational Stress Questionnaire    Feeling of Stress : Only a little  Social Connections: Moderately Isolated (09/07/2020)   Social Connection and Isolation Panel [NHANES]    Frequency of Communication with Friends and Family: More than three times a week    Frequency of Social Gatherings with Friends and Family: More than three times a week    Attends Religious Services: More than 4 times per year    Active Member of [employer] or Organizations: No    Attends Banker Meetings: Never    Marital Status: Divorced  Catering manager Violence: Not At Risk (09/07/2020)   Humiliation, Afraid, Rape, and Kick  questionnaire    Fear of Current or Ex-Partner: No    Emotionally Abused: No    Physically Abused: No    Sexually Abused: No    Family History  Problem Relation Age of Onset   Breast cancer Mother    Diabetes Mother    Dementia Mother    Liver cancer Mother    Heart disease Father        heart attack   Diabetes Brother    Breast cancer Sister    Colon cancer Neg Hx    Stomach cancer Neg Hx     Current Outpatient Medications on File Prior to Visit  Medication Sig Dispense Refill   amLODipine (NORVASC) 10 MG tablet TAKE 1 TABLET(10 MG) BY MOUTH DAILY 90 tablet 1   b complex vitamins tablet Take 1 tablet by mouth daily.     Cholecalciferol (VITAMIN D3) 250 MCG (10000 UT) TABS Take by mouth daily.     Fish Oil-Cholecalciferol (FISH OIL + D3 PO) Take 1,200 mg by mouth daily.      fluticasone (FLONASE) 50 MCG/ACT nasal spray SHAKE LIQUID AND USE 2 SPRAYS IN EACH NOSTRIL DAILY 16 g 5   glimepiride (AMARYL) 1 MG tablet Take 1 tablet (1 mg total) by mouth daily with breakfast. 90 tablet 3   loratadine (CLARITIN) 10 MG tablet Take 1 tablet (10 mg total) by mouth daily. 30 tablet 11   losartan (COZAAR) 100 MG tablet TAKE 1 TABLET BY MOUTH EVERY DAY 90 tablet 3   montelukast (SINGULAIR) 10 MG tablet Take 1 tablet (10 mg total) by mouth at bedtime. 30 tablet 3   Multiple Vitamin (MULTIVITAMIN) tablet Take 1 tablet by mouth daily. OVER 50 VITAMIN     sitaGLIPtin-metformin (JANUMET) 50-1000 MG tablet Take 1 tablet by mouth daily at 6 (six) AM. 90 tablet 3   No current facility-administered medications on file prior to visit.    Allergies  Allergen Reactions   Aspirin Nausea Only       Physical Exam Vitals requested from patient and listed below if patient had equipment and was able to obtain at home for this virtual visit: There were no vitals filed for this visit. Estimated body mass index is 30.73 kg/m as calculated from the following:   Height as of 06/26/22: 5' 4.25" (1.632  m).   Weight as of 06/26/22: 180 lb 6.4 oz (81.8 kg).  EKG (optional): deferred due to virtual visit  GENERAL: alert, oriented, no acute distress detected, full vision exam deferred due to pandemic and/or virtual encounter  HEENT:  atraumatic, conjunttiva clear, no obvious abnormalities on inspection of external nose and ears  NECK: normal movements of the head and neck  LUNGS: on inspection no signs of respiratory distress, breathing rate appears normal, no obvious gross SOB, gasping or wheezing  CV: no obvious cyanosis  MS: moves all visible extremities without noticeable abnormality  PSYCH/NEURO: pleasant and cooperative, no obvious depression or anxiety, speech and thought processing grossly intact, Cognitive function grossly intact  Flowsheet Row Office Visit from [facility]/23/2021 in [facility]  PHQ-9 Total Score 8           09/21/2022    5:16 PM 12/23/2021   12:09 PM 09/12/2021    2:11 PM 08/09/2021    4:18 PM 07/08/2021    1:29 PM  Depression screen PHQ 2/9  Decreased Interest 0 0 2 1 0  Down, Depressed, Hopeless 0 0 1 1 1   PHQ - 2 Score 0 0 3 2 1   Altered sleeping  3 2 3 3   Tired, decreased energy  3 0 1 2  Change in appetite  1 0 0   Feeling bad or failure about yourself   0 0 1 0  Trouble concentrating  1 1 0 1  Moving slowly or fidgety/restless  0 0 0 0  Suicidal thoughts  0 0 0 0  PHQ-9 Score  8 6 7 7   Difficult doing work/chores  Not difficult at all Somewhat difficult         07/08/2021    1:29 PM 08/09/2021    4:19 PM 09/12/2021    1:57 PM 12/05/2021   10:14 AM 09/21/2022    5:16 PM  Fall Risk  Falls in the past year? 0 0 0  0  Was there an injury with Fall? 0 0 0  0  Fall Risk Category Calculator 0 0 0  0  Fall Risk Category (Retired) Low Low Low    (RETIRED) Patient Fall Risk Level Low fall risk Low fall risk Low fall risk Low fall risk   Patient at Risk for Falls Due to No Fall Risks History of fall(s) No Fall Risks    Fall  risk Follow up Falls evaluation completed Falls evaluation completed Falls evaluation completed  Falls evaluation completed     SUMMARY AND PLAN:  Encounter for Medicare annual wellness exam   Discussed applicable health maintenance/preventive health measures and advised and referred or ordered per patient preferences: -had mammogram with gyn office, physicians for woman, yesterday -plans to get tetanus, flu and covid shots  -reports had her eye exam earlier this year, but off the top of her head can't recall eye doctor Health Maintenance  Topic Date Due   Diabetic kidney evaluation - Urine ACR  04/16/2022   DTaP/Tdap/Td (2 - Td or Tdap) 05/04/2022   OPHTHALMOLOGY EXAM  06/25/2022   INFLUENZA VACCINE  08/17/2022   COVID-19 Vaccine (4 - 2023-24 season) 09/17/2022   Zoster Vaccines- Shingrix (1 of 2) 09/26/2022 (Originally 03/19/1974)   HEMOGLOBIN A1C  11/29/2022   Diabetic kidney evaluation - eGFR measurement  01/21/2023   FOOT EXAM  06/26/2023   MAMMOGRAM  09/20/2023   Medicare Annual Wellness (AWV)  09/21/2023   Colonoscopy  10/26/2026   Pneumonia Vaccine 63+ Years old  Completed   DEXA SCAN  Completed   Hepatitis C Screening  Completed   HPV VACCINES  Aged [employer] and counseling on the following was provided based on the  above review of health and a plan/checklist for the patient, along with additional information discussed, was provided for the patient in the patient instructions :  -Advised on importance of completing advanced directives, discussed options for completing and provided information in patient instructions as well -Advised and counseled on a healthy lifestyle - including the importance of a healthy diet, regular physical activity, social connections and stress management. -Reviewed patient's current diet. Advised and counseled on a whole foods based healthy diet. A summary of a healthy diet was provided in the Patient Instructions.  -reviewed  patient's current physical activity level and discussed exercise guidelines for adults. Discussed community resources and ideas for safe exercise at home to assist in meeting exercise guideline recommendations in a safe and healthy way.  -Advise yearly dental visits at minimum and regular eye exams -Advised and counseled on alcohol safe limits, risks  Follow up: see patient instructions     Patient Instructions  I really enjoyed getting to talk with you today! I am available on Tuesdays and Thursdays for virtual visits if you have any questions or concerns, or if I can be of any further assistance.   CHECKLIST FROM ANNUAL WELLNESS VISIT:  -Follow up (please call to schedule if not scheduled after visit):   -yearly for annual wellness visit with primary care office  Here is a list of your preventive care/health maintenance measures and the plan for each if any are due:  PLAN For any measures below that may be due:  -please get the updated flu and covid shots and let us know when you do so that we can update your record -please ask your gynecologist to send [place] a copy of your mammogram -please ask your eye doctor to send [place] and updated diabetic eye exam result  Health Maintenance  Topic Date Due   Diabetic kidney evaluation - Urine ACR  04/16/2022   DTaP/Tdap/Td (2 - Td or Tdap) 05/04/2022   OPHTHALMOLOGY EXAM  06/25/2022   INFLUENZA VACCINE  08/17/2022   COVID-19 Vaccine (4 - 2023-24 season) 09/17/2022   Zoster Vaccines- Shingrix (1 of 2) 09/26/2022 (Originally 03/19/1974)   HEMOGLOBIN A1C  11/29/2022   Diabetic kidney evaluation - eGFR measurement  01/21/2023   FOOT EXAM  06/26/2023   MAMMOGRAM  09/20/2023   Medicare Annual Wellness (AWV)  09/21/2023   Colonoscopy  10/26/2026   Pneumonia Vaccine 7+ Years old  Completed   DEXA SCAN  Completed   Hepatitis C Screening  Completed   HPV VACCINES  Aged Out    -See a dentist at least yearly  -Get your eyes checked and then per  your eye specialist's recommendations  -Other issues addressed today:   -I have included below further information regarding a healthy whole foods based diet, physical activity guidelines for adults, stress management and opportunities for social connections. I hope you find this information useful.   -----------------------------------------------------------------------------------------------------------------------------------------------------------------------------------------------------------------------------------------------------------  NUTRITION: -eat real food: lots of colorful vegetables (half the plate) and fruits -5-7 servings of vegetables and fruits per day (fresh or steamed is best), exp. 2 servings of vegetables with lunch and dinner and 2 servings of fruit per day. Berries and greens such as kale and collards are great choices.  -consume on a regular basis: whole grains (make sure first ingredient on label contains the word "whole"), fresh fruits, fish, nuts, seeds, healthy oils (such as olive oil, avocado oil, grape seed oil) -may eat small amounts of dairy and lean meat on occasion, but avoid processed  meats such as ham, bacon, lunch meat, etc. -drink water -try to avoid fast food and pre-packaged foods, processed meat -most experts advise limiting sodium to < 2300mg  per day, should limit further is any chronic conditions such as high blood pressure, heart disease, diabetes, etc. The American Heart Association advised that < 1500mg  is is ideal -try to avoid foods that contain any ingredients with names you do not recognize  -try to avoid sugar/sweets (except for the natural sugar that occurs in fresh fruit) -try to avoid sweet drinks -try to avoid white rice, white bread, pasta (unless whole grain), white or yellow potatoes  EXERCISE GUIDELINES FOR ADULTS: -if you wish to increase your physical activity, do so gradually and with the approval of your doctor -STOP and  seek medical care immediately if you have any chest pain, chest discomfort or trouble breathing when starting or increasing exercise  -move and stretch your body, legs, feet and arms when sitting for long periods -Physical activity guidelines for optimal health in adults: -least 150 minutes per week of aerobic exercise (can talk, but not sing) once approved by your doctor, 20-30 minutes of sustained activity or two 10 minute episodes of sustained activity every day.  -resistance training at least 2 days per week if approved by your doctor -balance exercises 3+ days per week:   Stand somewhere where you have something sturdy to hold onto if you lose balance.    1) lift up on toes, start with 5x per day and work up to 20x   2) stand and lift on leg straight out to the side so that foot is a few inches of the floor, start with 5x each side and work up to 20x each side   3) stand on one foot, start with 5 seconds each side and work up to 20 seconds on each side  If you need ideas or help with getting more active:  -Silver sneakers https://tools.silversneakers.com  -Walk with a Doc: http://www.duncan-williams.com/  -try to include resistance (weight lifting/strength building) and balance exercises twice per week: or the following link for ideas: http://castillo-powell.com/  BuyDucts.dk  STRESS MANAGEMENT: -can try meditating, or just sitting quietly with deep breathing while intentionally relaxing all parts of your body for 5 minutes daily -if you need further help with stress, anxiety or depression please follow up with your primary doctor or contact the wonderful folks at [facility]: [phone]  SOCIAL CONNECTIONS: -options in [place] if you wish to engage in more social and exercise related activities:  -Silver sneakers https://tools.silversneakers.com  -Walk with a  Doc: http://www.duncan-williams.com/  -Check out the [employer] Active Adults 50+ section on the [place] [employer] (hiking clubs, book clubs, cards and games, chess, exercise classes, aquatic classes and much more) - see the website for details: https://www.[place].gov/departments/parks-recreation/active-adults50  -YouTube has lots of exercise videos for different ages and abilities as well  -[facility] (a variety of indoor and outdoor inperson activities for adults). [phone]. [address]  -Virtual Online Classes (a variety of topics): see seniorplanet.org or call [phone]  -consider volunteering at a school, hospice center, church, senior center or elsewhere    ADVANCED HEALTHCARE DIRECTIVES:  Everyone should have advanced health care directives in place. This is so that you get the care you want, should you ever be in a situation where you are unable to make your own medical decisions.   From the [place] Advanced Directive Website: "Advance Health Care Directives are legal documents in which  you give written instructions about your health care if, in the future, you cannot speak for yourself.   A health care power of attorney allows you to name a person you trust to make your health care decisions if you cannot make them yourself. A declaration of a desire for a natural death (or living will) is document, which states that you desire not to have your life prolonged by extraordinary measures if you have a terminal or incurable illness or if you are in a vegetative state. An advance instruction for mental health treatment makes a declaration of instructions, information and preferences regarding your mental health treatment. It also states that you are aware that the advance instruction authorizes a mental health treatment provider to act according to your wishes. It may also outline your consent or refusal of mental health treatment. A declaration of an  anatomical gift allows anyone over the age of 71 to make a gift by will, organ donor card or other document."   Please see the following website or an elder law attorney for forms, FAQs and for completion of advanced directives: [place] TEFL teacher Health Care Directives Advance Health Care Directives (http://guzman.com/)  Or copy and paste the following to your web browser: PoshChat.fi         Terressa Koyanagi, DO

## 2022-09-21 NOTE — Patient Instructions (Signed)
I really enjoyed getting to talk with you today! I am available on Tuesdays and Thursdays for virtual visits if you have any questions or concerns, or if I can be of any further assistance.   CHECKLIST FROM ANNUAL WELLNESS VISIT:  -Follow up (please call to schedule if not scheduled after visit):   -yearly for annual wellness visit with primary care office  Here is a list of your preventive care/health maintenance measures and the plan for each if any are due:  PLAN For any measures below that may be due:  -please get the updated flu and covid shots and let us know when you do so that we can update your record -please ask your gynecologist to send [place] a copy of your mammogram -please ask your eye doctor to send [place] and updated diabetic eye exam result  Health Maintenance  Topic Date Due   Diabetic kidney evaluation - Urine ACR  04/16/2022   DTaP/Tdap/Td (2 - Td or Tdap) 05/04/2022   OPHTHALMOLOGY EXAM  06/25/2022   INFLUENZA VACCINE  08/17/2022   COVID-19 Vaccine (4 - 2023-24 season) 09/17/2022   Zoster Vaccines- Shingrix (1 of 2) 09/26/2022 (Originally 03/19/1974)   HEMOGLOBIN A1C  11/29/2022   Diabetic kidney evaluation - eGFR measurement  01/21/2023   FOOT EXAM  06/26/2023   MAMMOGRAM  09/20/2023   Medicare Annual Wellness (AWV)  09/21/2023   Colonoscopy  10/26/2026   Pneumonia Vaccine 14+ Years old  Completed   DEXA SCAN  Completed   Hepatitis C Screening  Completed   HPV VACCINES  Aged Out    -See a dentist at least yearly  -Get your eyes checked and then per your eye specialist's recommendations  -Other issues addressed today:   -I have included below further information regarding a healthy whole foods based diet, physical activity guidelines for adults, stress management and opportunities for social connections. I hope you find this information useful.    -----------------------------------------------------------------------------------------------------------------------------------------------------------------------------------------------------------------------------------------------------------  NUTRITION: -eat real food: lots of colorful vegetables (half the plate) and fruits -5-7 servings of vegetables and fruits per day (fresh or steamed is best), exp. 2 servings of vegetables with lunch and dinner and 2 servings of fruit per day. Berries and greens such as kale and collards are great choices.  -consume on a regular basis: whole grains (make sure first ingredient on label contains the word "whole"), fresh fruits, fish, nuts, seeds, healthy oils (such as olive oil, avocado oil, grape seed oil) -may eat small amounts of dairy and lean meat on occasion, but avoid processed meats such as ham, bacon, lunch meat, etc. -drink water -try to avoid fast food and pre-packaged foods, processed meat -most experts advise limiting sodium to < 2300mg  per day, should limit further is any chronic conditions such as high blood pressure, heart disease, diabetes, etc. The American Heart Association advised that < 1500mg  is is ideal -try to avoid foods that contain any ingredients with names you do not recognize  -try to avoid sugar/sweets (except for the natural sugar that occurs in fresh fruit) -try to avoid sweet drinks -try to avoid white rice, white bread, pasta (unless whole grain), white or yellow potatoes  EXERCISE GUIDELINES FOR ADULTS: -if you wish to increase your physical activity, do so gradually and with the approval of your doctor -STOP and seek medical care immediately if you have any chest pain, chest discomfort or trouble breathing when starting or increasing exercise  -move and stretch your body, legs, feet and arms when sitting  for long periods -Physical activity guidelines for optimal health in adults: -least 150 minutes per week of  aerobic exercise (can talk, but not sing) once approved by your doctor, 20-30 minutes of sustained activity or two 10 minute episodes of sustained activity every day.  -resistance training at least 2 days per week if approved by your doctor -balance exercises 3+ days per week:   Stand somewhere where you have something sturdy to hold onto if you lose balance.    1) lift up on toes, start with 5x per day and work up to 20x   2) stand and lift on leg straight out to the side so that foot is a few inches of the floor, start with 5x each side and work up to 20x each side   3) stand on one foot, start with 5 seconds each side and work up to 20 seconds on each side  If you need ideas or help with getting more active:  -Silver sneakers https://tools.silversneakers.com  -Walk with a Doc: http://www.duncan-williams.com/  -try to include resistance (weight lifting/strength building) and balance exercises twice per week: or the following link for ideas: http://castillo-powell.com/  BuyDucts.dk  STRESS MANAGEMENT: -can try meditating, or just sitting quietly with deep breathing while intentionally relaxing all parts of your body for 5 minutes daily -if you need further help with stress, anxiety or depression please follow up with your primary doctor or contact the wonderful folks at [facility]: [phone]  SOCIAL CONNECTIONS: -options in [place] if you wish to engage in more social and exercise related activities:  -Silver sneakers https://tools.silversneakers.com  -Walk with a Doc: http://www.duncan-williams.com/  -Check out the [facility] Active Adults 50+ section on the [place] of [employer] (hiking clubs, book clubs, cards and games, chess, exercise classes, aquatic classes and much more) - see the website for  details: https://www.[place].gov/departments/parks-recreation/active-adults50  -YouTube has lots of exercise videos for different ages and abilities as well  -[facility] (a variety of indoor and outdoor inperson activities for adults). [phone]. [address].  -Virtual Online Classes (a variety of topics): see seniorplanet.org or call [phone]  -consider volunteering at a school, hospice center, church, senior center or elsewhere    ADVANCED HEALTHCARE DIRECTIVES:  Everyone should have advanced health care directives in place. This is so that you get the care you want, should you ever be in a situation where you are unable to make your own medical decisions.   From the [place] Advanced Directive Website: "Advance Health Care Directives are legal documents in which you give written instructions about your health care if, in the future, you cannot speak for yourself.   A health care power of attorney allows you to name a person you trust to make your health care decisions if you cannot make them yourself. A declaration of a desire for a natural death (or living will) is document, which states that you desire not to have your life prolonged by extraordinary measures if you have a terminal or incurable illness or if you are in a vegetative state. An advance instruction for mental health treatment makes a declaration of instructions, information and preferences regarding your mental health treatment. It also states that you are aware that the advance instruction authorizes a mental health treatment provider to act according to your wishes. It may also outline your consent or refusal of mental health treatment. A declaration of an anatomical gift allows anyone over the age of 84 to make a gift by will,  organ donor card or other document."   Please see the following website or an elder law attorney for forms, FAQs and for completion of advanced directives: [place]  TEFL teacher Health Care Directives Advance Health Care Directives (http://guzman.com/)  Or copy and paste the following to your web browser: PoshChat.fi

## 2022-09-22 ENCOUNTER — Telehealth: Payer: Self-pay | Admitting: Family Medicine

## 2022-09-22 NOTE — Telephone Encounter (Signed)
Pt is calling to let dr Selena Batten know her eye doctor is at [facility] and the phone number is [phone]

## 2022-10-29 DIAGNOSIS — R519 Headache, unspecified: Secondary | ICD-10-CM | POA: Diagnosis not present

## 2022-10-29 DIAGNOSIS — J019 Acute sinusitis, unspecified: Secondary | ICD-10-CM | POA: Diagnosis not present

## 2022-11-02 ENCOUNTER — Other Ambulatory Visit (HOSPITAL_BASED_OUTPATIENT_CLINIC_OR_DEPARTMENT_OTHER): Payer: Self-pay | Admitting: Physician Assistant

## 2022-11-02 DIAGNOSIS — R519 Headache, unspecified: Secondary | ICD-10-CM

## 2022-11-03 ENCOUNTER — Other Ambulatory Visit: Payer: Self-pay | Admitting: Family Medicine

## 2022-11-03 DIAGNOSIS — J301 Allergic rhinitis due to pollen: Secondary | ICD-10-CM

## 2022-11-12 ENCOUNTER — Ambulatory Visit (HOSPITAL_BASED_OUTPATIENT_CLINIC_OR_DEPARTMENT_OTHER): Payer: Medicare Other

## 2022-11-17 ENCOUNTER — Telehealth: Payer: Self-pay | Admitting: *Deleted

## 2022-11-17 ENCOUNTER — Other Ambulatory Visit: Payer: Self-pay

## 2022-11-17 ENCOUNTER — Ambulatory Visit (HOSPITAL_BASED_OUTPATIENT_CLINIC_OR_DEPARTMENT_OTHER)
Admission: RE | Admit: 2022-11-17 | Discharge: 2022-11-17 | Disposition: A | Discharge status: Home / Self Care | Payer: Medicare Other | Source: Ambulatory Visit | Attending: Physician Assistant | Admitting: Physician Assistant

## 2022-11-17 ENCOUNTER — Encounter (HOSPITAL_COMMUNITY): Payer: Self-pay | Admitting: *Deleted

## 2022-11-17 ENCOUNTER — Emergency Department (HOSPITAL_COMMUNITY)
Admission: EM | Admit: 2022-11-17 | Discharge: 2022-11-18 | Disposition: A | Discharge status: Home / Self Care | Payer: Medicare Other | Attending: Emergency Medicine | Admitting: Emergency Medicine

## 2022-11-17 ENCOUNTER — Emergency Department (HOSPITAL_COMMUNITY): Payer: Medicare Other

## 2022-11-17 DIAGNOSIS — I6782 Cerebral ischemia: Secondary | ICD-10-CM | POA: Diagnosis not present

## 2022-11-17 DIAGNOSIS — R930 Abnormal findings on diagnostic imaging of skull and head, not elsewhere classified: Secondary | ICD-10-CM | POA: Diagnosis not present

## 2022-11-17 DIAGNOSIS — Z79899 Other long term (current) drug therapy: Secondary | ICD-10-CM | POA: Insufficient documentation

## 2022-11-17 DIAGNOSIS — R519 Headache, unspecified: Secondary | ICD-10-CM | POA: Insufficient documentation

## 2022-11-17 DIAGNOSIS — I10 Essential (primary) hypertension: Secondary | ICD-10-CM | POA: Insufficient documentation

## 2022-11-17 DIAGNOSIS — D320 Benign neoplasm of cerebral meninges: Secondary | ICD-10-CM | POA: Diagnosis not present

## 2022-11-17 DIAGNOSIS — G936 Cerebral edema: Secondary | ICD-10-CM | POA: Diagnosis not present

## 2022-11-17 DIAGNOSIS — D329 Benign neoplasm of meninges, unspecified: Secondary | ICD-10-CM

## 2022-11-17 DIAGNOSIS — R220 Localized swelling, mass and lump, head: Secondary | ICD-10-CM | POA: Diagnosis not present

## 2022-11-17 DIAGNOSIS — J301 Allergic rhinitis due to pollen: Secondary | ICD-10-CM

## 2022-11-17 LAB — CBC WITH DIFFERENTIAL/PLATELET
Abs Immature Granulocytes: 0.03 10*3/uL (ref 0.00–0.07)
Basophils Absolute: 0.1 10*3/uL (ref 0.0–0.1)
Basophils Relative: 1 %
Eosinophils Absolute: 0.1 10*3/uL (ref 0.0–0.5)
Eosinophils Relative: 1 %
HCT: 38.1 % (ref 36.0–46.0)
Hemoglobin: 11.5 g/dL — ABNORMAL LOW (ref 12.0–15.0)
Immature Granulocytes: 0 %
Lymphocytes Relative: 26 %
Lymphs Abs: 2.7 10*3/uL (ref 0.7–4.0)
MCH: 20.7 pg — ABNORMAL LOW (ref 26.0–34.0)
MCHC: 30.2 g/dL (ref 30.0–36.0)
MCV: 68.5 fL — ABNORMAL LOW (ref 80.0–100.0)
Monocytes Absolute: 0.8 10*3/uL (ref 0.1–1.0)
Monocytes Relative: 8 %
Neutro Abs: 6.8 10*3/uL (ref 1.7–7.7)
Neutrophils Relative %: 64 %
Platelets: 355 10*3/uL (ref 150–400)
RBC: 5.56 MIL/uL — ABNORMAL HIGH (ref 3.87–5.11)
RDW: 15.6 % — ABNORMAL HIGH (ref 11.5–15.5)
WBC: 10.4 10*3/uL (ref 4.0–10.5)
nRBC: 0 % (ref 0.0–0.2)

## 2022-11-17 LAB — BASIC METABOLIC PANEL
Anion gap: 9 (ref 5–15)
BUN: 13 mg/dL (ref 8–23)
CO2: 23 mmol/L (ref 22–32)
Calcium: 9.6 mg/dL (ref 8.9–10.3)
Chloride: 105 mmol/L (ref 98–111)
Creatinine, Ser: 0.9 mg/dL (ref 0.44–1.00)
GFR, Estimated: >60 mL/min (ref >60)
Glucose, Bld: 187 mg/dL — ABNORMAL HIGH (ref 70–99)
Potassium: 3.7 mmol/L (ref 3.5–5.1)
Sodium: 137 mmol/L (ref 135–145)

## 2022-11-17 MED ORDER — MONTELUKAST SODIUM 10 MG PO TABS
10.0000 mg | ORAL_TABLET | Freq: Every day | ORAL | 3 refills | Status: DC
Start: 2022-11-17 — End: 2022-12-26

## 2022-11-17 MED ORDER — GADOBUTROL 1 MMOL/ML IV SOLN
7.0000 mL | Freq: Once | INTRAVENOUS | Status: AC | PRN
  Administered 2022-11-17: 7 mL via INTRAVENOUS

## 2022-11-17 MED ORDER — IOHEXOL 300 MG/ML  SOLN
100.0000 mL | Freq: Once | INTRAMUSCULAR | Status: AC | PRN
  Administered 2022-11-17: 75 mL via INTRAVENOUS

## 2022-11-17 MED ORDER — LORAZEPAM 2 MG/ML IJ SOLN
1.0000 mg | Freq: Once | INTRAMUSCULAR | Status: AC
  Administered 2022-11-17: 1 mg via INTRAVENOUS
  Filled 2022-11-17: qty 1

## 2022-11-17 NOTE — ED Notes (Signed)
Pt transported to MRI. Per PA, pt does not require RN monitoring in MRI.

## 2022-11-17 NOTE — ED Triage Notes (Signed)
The pt has had a headache for 3 months she saw her doctor who ordered a c-t and after that she was told to come here for a mri

## 2022-11-17 NOTE — Telephone Encounter (Signed)
Rx done. 

## 2022-11-17 NOTE — ED Provider Notes (Signed)
[place] EMERGENCY DEPARTMENT AT [facility] Provider Note   CSN: [id number] Arrival date & time: 11/17/22  2040    History  Chief Complaint  Patient presents with   Headache    Megan [name] is a 67 y.o. female here for evaluation of abnormal CT.  She states she has had intermittent headache to the right side of her head over the last month or so.  Describes as intermittent "sharp, pounding sensation."  Denies any blurred vision, numbness, weakness, neck pain.  No sudden onset thunderclap headache.  She was seen in the outpatient setting where they ordered a head CT.  She had this performed today which showed a possible bleed versus meningioma they recommended MRI.  Patient states she is severely claustrophobic.  Previously needed to go to [facility] for a "stand up, open" MRI.  She does have history of hypertension, states she is compliant with her medications.  She denies any anticoagulation.  No history of prior brain tumors.  HPI     Home Medications Prior to Admission medications   Medication Sig Start Date End Date Taking? Authorizing Provider  amLODipine (NORVASC) 10 MG tablet TAKE 1 TABLET(10 MG) BY MOUTH DAILY 07/14/22   Karie Georges, MD  b complex vitamins tablet Take 1 tablet by mouth daily.    [provider]  Cholecalciferol (VITAMIN D3) 250 MCG (10000 UT) TABS Take by mouth daily.    [provider]  Fish Oil-Cholecalciferol (FISH OIL + D3 PO) Take 1,200 mg by mouth daily.     [provider]  fluticasone (FLONASE) 50 MCG/ACT nasal spray SHAKE LIQUID AND USE 2 SPRAYS IN [facility] NOSTRIL DAILY 06/08/22   Karie Georges, MD  glimepiride (AMARYL) 1 MG tablet Take 1 tablet (1 mg total) by mouth daily with breakfast. 05/29/22   Shamleffer, Konrad Dolores, MD  loratadine (CLARITIN) 10 MG tablet Take 1 tablet (10 mg total) by mouth daily. 08/09/21   Karie Georges, MD  losartan (COZAAR) 100 MG tablet TAKE 1 TABLET BY MOUTH EVERY  DAY 05/22/22   Karie Georges, MD  montelukast (SINGULAIR) 10 MG tablet Take 1 tablet (10 mg total) by mouth at bedtime. 11/17/22   Karie Georges, MD  Multiple Vitamin (MULTIVITAMIN) tablet Take 1 tablet by mouth daily. OVER 50 VITAMIN    [provider]  sitaGLIPtin-metformin (JANUMET) 50-1000 MG tablet Take 1 tablet by mouth daily at 6 (six) AM. 05/29/22   Shamleffer, Konrad Dolores, MD      Allergies    Aspirin    Review of Systems   Review of Systems  Constitutional: Negative.   HENT: Negative.    Respiratory: Negative.    Cardiovascular: Negative.   Gastrointestinal: Negative.   Genitourinary: Negative.   Musculoskeletal: Negative.   Skin: Negative.   Neurological:  Positive for headaches.  All other systems reviewed and are negative.   Physical Exam Updated Vital Signs BP 136/79   Pulse 98   Temp 98 F (36.7 C) (Oral)   Resp 19   Ht 5\' 4"  (1.626 m)   Wt 81.8 kg   SpO2 100%   BMI 30.95 kg/m  Physical Exam Vitals and nursing note reviewed.  Constitutional:      General: She is not in acute distress.    Appearance: She is well-developed. She is not ill-appearing, toxic-appearing or diaphoretic.  HENT:     Head: Normocephalic and atraumatic.  Eyes:     General: No visual field deficit.  Pupils: Pupils are equal, round, and reactive to light.  Cardiovascular:     Rate and Rhythm: Normal rate.     Heart sounds: Normal heart sounds.  Pulmonary:     Effort: Pulmonary effort is normal. No respiratory distress.     Breath sounds: Normal breath sounds.  Abdominal:     General: There is no distension.  Musculoskeletal:        General: Normal range of motion.     Cervical back: Normal range of motion.  Skin:    General: Skin is warm and dry.  Neurological:     General: No focal deficit present.     Mental Status: She is alert.     Cranial Nerves: No cranial nerve deficit, dysarthria or facial asymmetry.     Sensory: No sensory deficit.      Motor: No weakness.     Coordination: Romberg sign negative.  Psychiatric:        Mood and Affect: Mood normal.        Behavior: Behavior normal.     ED Results / Procedures / Treatments   Labs (all labs ordered are listed, but only abnormal results are displayed) Labs Reviewed  CBC WITH DIFFERENTIAL/PLATELET - Abnormal; Notable for the following components:      Result Value   RBC 5.56 (*)    Hemoglobin 11.5 (*)    MCV 68.5 (*)    MCH 20.7 (*)    RDW 15.6 (*)    All other components within normal limits  BASIC METABOLIC PANEL - Abnormal; Notable for the following components:   Glucose, Bld 187 (*)    All other components within normal limits    EKG None  Radiology CT HEAD W & WO CONTRAST ( )  Addendum Date: 11/17/2022   ADDENDUM REPORT: 11/17/2022 20:22 ADDENDUM: Results discussed by telephone around the time of interpretation on 11/17/2022 at 8:00 pm to provider SCOTT LONG , who verbally acknowledged these results. Electronically Signed   By: Rise Mu M.D.   On: 11/17/2022 20:22   Result Date: 11/17/2022 CLINICAL DATA:  Initial evaluation for acute head pain. EXAM: CT HEAD WITHOUT AND WITH CONTRAST TECHNIQUE: Contiguous axial images were obtained from the base of the skull through the vertex without and with intravenous contrast. RADIATION DOSE REDUCTION: This exam was performed according to the departmental dose-optimization program which includes automated exposure control, adjustment of the mA and/or kV according to patient size and/or use of iterative reconstruction technique. CONTRAST:  75mL OMNIPAQUE IOHEXOL 300 MG/ML  SOLN COMPARISON:  None available. FINDINGS: Brain: Cerebral volume within normal limits. Patchy hypodensity involving the supratentorial cerebral white matter, most likely chronic small vessel ischemic disease. No visible acute large vessel territory infarct. 8 mm focus of hyperdensity seen along the anterior falx (series 2, image 15). Finding is  nonspecific, but could reflect a small focus of hemorrhage or possibly meningioma. No other acute intracranial hemorrhage. Partially calcified hyperdense mass measuring 2.8 cm seen overlying the right frontal convexity (series 3, image 17), likely meningioma. Mild localized vasogenic edema within the adjacent brain parenchyma. No midline shift. No other visible mass lesion. No hydrocephalus or extra-axial fluid collection. Vascular: No abnormal hyperdense vessel seen prior to contrast administration. Following contrast administration, normal intravascular enhancement seen throughout the brain. Skull: Scalp soft tissues demonstrate no acute finding. Calvarium intact. Sinuses/Orbits: Globes and orbital soft tissues within normal limits. Paranasal sinuses are clear. No mastoid effusion. Other: None. IMPRESSION: 1. 8 mm focus of hyperdensity along  the anterior falx, nonspecific, but could reflect a small focus of hemorrhage or meningioma. Correlation with dedicated MRI, with and without contrast recommended for further evaluation. 2. 2.8 cm partially calcified hyperdense mass overlying the right frontal convexity, likely meningioma. Mild localized vasogenic edema within the adjacent brain parenchyma. No midline shift. 3. Underlying mild chronic microvascular ischemic disease. Current attempt is being made to contact the ordering clinician. Results will be conveyed as soon as possible. Electronically Signed: By: Rise Mu M.D. On: 11/17/2022 19:52    Procedures Procedures    Medications Ordered in ED Medications  LORazepam (ATIVAN) injection 1 mg (1 mg Intravenous Given 11/17/22 2217)  LORazepam (ATIVAN) injection 1 mg (1 mg Intravenous Given 11/17/22 2244)  gadobutrol (GADAVIST) 1 MMOL/ML injection 7 mL (7 mLs Intravenous Contrast Given 11/17/22 2310)   ED Course/ Medical Decision Making/ A&P   67 year old here for evaluation of headache over the last month.  Located right side of her head.   Intermittent in nature.  She has a nonfocal neuroexam without deficits.  No sudden onset thunderclap headache.  Head CT scan in the outpatient setting performed today which showed possible bleed versus meningioma they recommended MRI with and without contrast to further evaluate.  Patient here for further imaging however states she has severe claustrophobia previously had to go to [facility] to have a stand-up open MRI.  She is agreeable to try IV anxiety medication and try the MRI here.  Plan on touching base with neurosurgery afterwards  Labs and imaging personally viewed and interpreted:  CBC without significant abnormality, hemoglobin 11.5 BMP glucose 187 MR brain pending  Care transferred to oncoming provider who will follow-up on imaging and disposition                                Medical Decision Making Amount and/or Complexity of Data Reviewed Independent Historian: friend External Data Reviewed: labs, radiology and notes. Labs: ordered. Decision-making details documented in ED Course. Radiology: ordered and independent interpretation performed. Decision-making details documented in ED Course.  Risk OTC drugs. Prescription drug management. Parenteral controlled substances. Decision regarding hospitalization. Diagnosis or treatment significantly limited by social determinants of health.         Final Clinical Impression(s) / ED Diagnoses Final diagnoses:  None    Rx / DC Orders ED Discharge Orders     None         Melroy Bougher A, PA-C 11/17/22 4401    Arby Barrette, MD 11/19/22 1611

## 2022-11-18 LAB — POCT I-STAT CREATININE: Creatinine, Ser: 1 mg/dL (ref 0.44–1.00)

## 2022-11-18 NOTE — Discharge Instructions (Signed)
You were diagnosed tonight with a meningioma.  Please schedule a follow-up appointment with [facility] for further evaluation and management.  If you develop any life-threatening symptoms please return to the emergency department.

## 2022-11-18 NOTE — ED Provider Notes (Signed)
  Physical Exam  BP 119/73   Pulse 87   Temp 98.9 F (37.2 C) (Oral)   Resp 19   Ht 5\' 4"  (1.626 m)   Wt 81.8 kg   SpO2 100%   BMI 30.95 kg/m   Physical Exam  Procedures  Procedures  ED Course / MDM    Medical Decision Making Amount and/or Complexity of Data Reviewed Labs: ordered. Radiology: ordered.  Risk Prescription drug management.   Patient care assumed at shift handoff from previous provider.  See her note for full details.  In short, 67 year old female patient presented to the emergency department due to abnormal CT.  Patient has reported intermittent right sided headache for the past month described as sharp and pounding.  Denied associated blurred vision, numbness, weakness, neck pain.  Head CT showed possible head bleed versus meningioma and recommended MRI.  Patient presents the emergency department for MRI.  At time of my assumption of care MRIs completed and waiting for radiology read. 1. No acute intracranial abnormality. 2. 2.9 x 1.3 x 3.0 cm meningioma overlying the right frontal convexity. Mild mass effect on the subjacent brain parenchyma without significant vasogenic edema. 3. Additional 8 mm enhancing lesion straddling the anterior falx, also likely a small meningioma. No associated edema or mass effect. This lesion accounts for previously noted hyperdensity on prior CT. No acute intracranial hemorrhage. 4. Underlying mild to moderate chronic microvascular ischemic disease.  I agree with the radiologist findings  Patient continues to have no neurologic symptoms.  She has no headache at this time.  Discussed case with Sharolyn Douglas, PA-C, who works with Dr.Ostengaard, neurosurgery.  Recommended outpatient follow-up with no further emergent workup.  Discussed imaging results with patient and explained that she would follow-up as an outpatient with neurosurgery.  Patient voices understanding with plan.  Patient will not drive this evening due to  previously administered Ativan.  She is stable at this time for discharge home.   Pamala Duffel 11/18/22 0241    Tilden Fossa, MD 11/19/22 [phone]

## 2022-11-18 NOTE — ED Notes (Signed)
Pt in NAD at d/c from ED. A&O. Ambulatory. Respirations even & unlabored. Skin warm & dry. Pt & pts sister verbalized understanding of d/c teaching including follow up care and reasons to return to the ED. No needs or questions expressed at d/c. Pt d/c'd with sister to drive pt home. No needs or questions expressed at d/c.

## 2022-11-21 DIAGNOSIS — M5416 Radiculopathy, lumbar region: Secondary | ICD-10-CM | POA: Diagnosis not present

## 2022-11-23 ENCOUNTER — Encounter: Payer: Self-pay | Admitting: Internal Medicine

## 2022-11-23 ENCOUNTER — Ambulatory Visit: Payer: Medicare Other | Admitting: Internal Medicine

## 2022-11-23 VITALS — BP 132/80 | HR 94 | Ht 64.0 in | Wt 184.0 lb

## 2022-11-23 DIAGNOSIS — Z7984 Long term (current) use of oral hypoglycemic drugs: Secondary | ICD-10-CM

## 2022-11-23 DIAGNOSIS — E119 Type 2 diabetes mellitus without complications: Secondary | ICD-10-CM

## 2022-11-23 LAB — POCT GLYCOSYLATED HEMOGLOBIN (HGB A1C): Hemoglobin A1C: 6.8 % — AB (ref 4.0–5.6)

## 2022-11-23 MED ORDER — GLIMEPIRIDE 1 MG PO TABS: 1.0000 mg | ORAL_TABLET | Freq: Every day | ORAL | 3 refills | Status: DC

## 2022-11-23 MED ORDER — JANUMET 50-1000 MG PO TABS: 1.0000 | ORAL_TABLET | Freq: Every day | ORAL | 3 refills | Status: DC

## 2022-11-23 NOTE — Progress Notes (Signed)
Name: Megan [name]  Age/ Sex: 67 y.o., female   MRN/ DOB: [id number], [birth year]     PCP: Karie Georges, MD   Reason for Endocrinology Evaluation: Type 2 Diabetes Mellitus  Initial Endocrine Consultative Visit: 09/02/2019    PATIENT IDENTIFIER: Megan [name] is a 67 y.o. female with a past medical history of T2DM, HTN and Dyslipidemia. The patient has followed with Endocrinology clinic since 09/02/2019 for consultative assistance with management of her diabetes.  DIABETIC HISTORY:  Megan [name] was diagnosed with DM in 2008.She has been on Metformin, Glimepiride, repaglinide and Januvia . Her hemoglobin A1c has ranged from 6.5% in 2014, peaking at 7.4% in 10/2018  On her initial visit to our clinic she has an A1c of 7.3% . She was on Repaglinide which we stopped due to hypoglycemia, Decreased Glimepiride and continued Janumet.   SUBJECTIVE:   During the last visit (05/29/2022): A1c 6.3 %     Today (11/23/2022): Megan [name] is here for a follow up on diabetes management.  She checks her blood sugars 1 times daily. The patient has not had hypoglycemic episodes since the last clinic visit.  She was diagnosed with meningioma on MRI 11/2022 She has sharp headaches occasionally   She has allergy symptoms and attributes hyperglycemia  Denies nausea or vomiting  Denies diarrhea but has occasional constipation    HOME DIABETES REGIMEN:  Janumet 50-1000 mg daily  Glimepiride 1 mg , 1 tabs daily     Statin: yes  ACE-I/ARB: yes    METER DOWNLOAD SUMMARY: Unable to download  129- 165 mg/dL   DIABETIC COMPLICATIONS: Microvascular complications:   Denies: CKD, retinopathy, neuropathy Last Eye Exam: Completed 06/24/2021    Macrovascular complications:   Denies: CAD, CVA, PVD   HISTORY:  Past Medical History:  Past Medical History:  Diagnosis Date   Allergy    Arthritis    fingers   Colon polyp    Diabetes mellitus    Diverticulosis    GERD  (gastroesophageal reflux disease)    occasionally   H/O hiatal hernia    ?   Hemorrhoids    Hyperlipidemia    takes fish oil   Hypertension    Infectious colitis    Ventral hernia    Past Surgical History:  Past Surgical History:  Procedure Laterality Date   colonscopy     DIAGNOSTIC LAPAROSCOPY     FOOT SURGERY Bilateral    hammer toes   HEMORRHOID SURGERY     HERNIA REPAIR  01/20/2011   incisional   INCISIONAL HERNIA REPAIR  01/20/2011   Procedure: LAPAROSCOPIC INCISIONAL HERNIA;  Surgeon: Rulon Abide, DO;  Location: [facility];  Service: General;  Laterality: N/A;  Laparoscopic repair of a recurrent incisional hernia with Mesh   INGUINAL HERNIA REPAIR     x's 3   VENTRAL HERNIA REPAIR     Social History:  reports that she quit smoking about 26 years ago. Her smoking use included cigarettes. She started smoking about 46 years ago. She has a 20 pack-year smoking history. She has never used smokeless tobacco. She reports current alcohol use of about 4.0 standard drinks of alcohol per week. She reports that she does not use drugs. Family History:  Family History  Problem Relation Age of Onset   Breast cancer Mother    Diabetes Mother    Dementia Mother    Liver cancer Mother    Heart disease Father  heart attack   Diabetes Brother    Breast cancer Sister    Colon cancer Neg Hx    Stomach cancer Neg Hx      HOME MEDICATIONS: Allergies as of 11/23/2022       Reactions   Aspirin Nausea Only        Medication List        Accurate as of November 23, 2022  8:06 AM. If you have any questions, ask your nurse or doctor.          amLODipine 10 MG tablet Commonly known as: NORVASC TAKE 1 TABLET(10 MG) BY MOUTH DAILY   amoxicillin 875 MG tablet Commonly known as: AMOXIL Take 875 mg by mouth 2 (two) times daily.   b complex vitamins tablet Take 1 tablet by mouth daily.   FISH OIL + D3 PO Take 1,200 mg by mouth daily.   fluticasone 50 MCG/ACT nasal  spray Commonly known as: FLONASE SHAKE LIQUID AND USE 2 SPRAYS IN EACH NOSTRIL DAILY   glimepiride 1 MG tablet Commonly known as: AMARYL Take 1 tablet (1 mg total) by mouth daily with breakfast.   Janumet 50-1000 MG tablet Generic drug: sitaGLIPtin-metformin Take 1 tablet by mouth daily at 6 (six) AM.   loratadine 10 MG tablet Commonly known as: CLARITIN Take 1 tablet (10 mg total) by mouth daily.   losartan 100 MG tablet Commonly known as: COZAAR TAKE 1 TABLET BY MOUTH EVERY DAY   montelukast 10 MG tablet Commonly known as: SINGULAIR Take 1 tablet (10 mg total) by mouth at bedtime.   multivitamin tablet Take 1 tablet by mouth daily. OVER 50 VITAMIN   Vitamin D3 250 MCG (10000 UT) Tabs Take by mouth daily.         OBJECTIVE:   Vital Signs: BP 132/80 (BP Location: Left Arm, Patient Position: Sitting, Cuff Size: Large)   Pulse 94   Ht 5\' 4"  (1.626 m)   Wt 184 lb (83.5 kg)   SpO2 97%   BMI 31.58 kg/m   Wt Readings from Last 3 Encounters:  11/23/22 184 lb (83.5 kg)  11/17/22 180 lb 5.4 oz (81.8 kg)  06/26/22 180 lb 6.4 oz (81.8 kg)     Exam: General: Pt appears well and is in NAD  Neck: General: Supple without adenopathy. Thyroid: Right thyroid fullness   Lungs: Clear with good BS bilat   Heart: RRR   Extremities: No pretibial edema.  Neuro: MS is good with appropriate affect, pt is alert and Ox3    DM foot exam  05/29/2022 The skin of the feet is intact without sores or ulcerations. The pedal pulses are 2+ on right and 2+ on left. The sensation is intact to a screening 5.07, 10 gram monofilament bilaterally     DATA REVIEWED:  Lab Results  Component Value Date   HGBA1C 6.8 (A) 11/23/2022   HGBA1C 6.3 (A) 05/29/2022   HGBA1C 6.3 (A) 11/28/2021    Latest Reference Range & Units 11/17/22 21:54  Sodium 135 - 145 mmol/L 137  Potassium 3.5 - 5.1 mmol/L 3.7  Chloride 98 - 111 mmol/L 105  CO2 22 - 32 mmol/L 23  Glucose 70 - 99 mg/dL 161 (H)  BUN 8  - 23 mg/dL 13  Creatinine 0.96 - 0.45 mg/dL 4.09  Calcium 8.9 - 81.1 mg/dL 9.6  Anion gap 5 - 15  9  GFR, Estimated >60 mL/min >60  (H): Data is abnormally high    Thyroid ultrasound 05/30/2018  Estimated total number of nodules >/= 1 cm: 2   Number of spongiform nodules >/=  2 cm not described below (TR1): 0   Number of mixed cystic and solid nodules >/= 1.5 cm not described below (TR2): 0   _________________________________________________________   The previously biopsied mass in the right mid and lower gland measures 5.6 x 4.0 x 2.6 cm. This appears grossly stable compared to prior although measurements were obtained in slightly different planes and so precise measurements are more challenging. However, the mass measured up to 5.2 cm in June of 2015. There has been no significant mitral change over the past 5 years.   Stable appearance of a multiple subcentimeter nodules scattered throughout the left mid gland. None of the nodules meet criteria for biopsy or further imaging surveillance.   IMPRESSION: No significant interval change in the size or appearance of the previously biopsied nodule occupying the majority of the right gland. Five year stability is consistent with benignity.   Multiple small nodules scattered throughout the left mid gland remain unchanged and do not meet criteria for further evaluation.   The above is in keeping with the ACR TI-RADS recommendations - J Am Coll Radiol 2017;14:587-595.    Right Nodule FNA 05/16/2012  THYROID, FINE NEEDLE ASPIRATION, RIGHT BENIGN. FINDINGS CONSISTENT WITH NON-NEOPLASTIC GOITER.    Old records , labs and images have been reviewed.    ASSESSMENT / PLAN / RECOMMENDATIONS:   1) Type 2 Diabetes Mellitus, Optimally controlled, Without complications - Most recent A1c of 6.8 %. Goal A1c < 7.0 %.    - A1c remains optimal  -In the past she has declined switching Januvia to Rybelsus -Today we entertain the idea  of injectable GLP-1 agonist as she was complaining of weight gain, but she does not want to do injectables -No changes   MEDICATIONS: - Continue Glimepiride 1 mg, one  tablet before Breakfast  - Continue  Janumet 50-1000 mg ONCE daily   EDUCATION / INSTRUCTIONS: BG monitoring instructions: Patient is instructed to check her blood sugars 1 times a day, fasting . Call [facility] if: BG persistently < 70 I reviewed the Rule of 15 for the treatment of hypoglycemia in detail with the patient. Literature supplied.   2) Diabetic complications:  Eye: Does not have known diabetic retinopathy.  Neuro/ Feet: Does not have known diabetic peripheral neuropathy .  Renal: Patient does not have known baseline CKD. She   is on an ACEI/ARB at present.      F/U in 6 months    Signed electronically by: Lyndle Herrlich, MD  [facility]  [facility] [address] [place] [place], [place] [address] Phone: [phone] FAX: [phone]   CC: Karie Georges, MD [address] [place] [address] Phone: [phone]  Fax: [phone]  Return to Endocrinology clinic as below: Future Appointments  Date Time Provider Department Center  12/26/2022  2:30 PM Karie Georges, MD [facility]  06/21/2023  1:30 PM Terri Piedra, DO [facility] None

## 2022-11-23 NOTE — Patient Instructions (Signed)
-   Continue Glimepiride 1 mg, one  tablet before Breakfast  - Continue Janumet 50-1000 mg ONCE daily      HOW TO TREAT LOW BLOOD SUGARS (Blood sugar LESS THAN 70 MG/DL) Please follow the RULE OF 15 for the treatment of hypoglycemia treatment (when your (blood sugars are less than 70 mg/dL)   STEP 1: Take 15 grams of carbohydrates when your blood sugar is low, which includes:  3-4 GLUCOSE TABS  OR 3-4 OZ OF JUICE OR REGULAR SODA OR ONE TUBE OF GLUCOSE GEL    STEP 2: RECHECK blood sugar in 15 MINUTES STEP 3: If your blood sugar is still low at the 15 minute recheck --> then, go back to STEP 1 and treat AGAIN with another 15 grams of carbohydrates.

## 2022-12-01 ENCOUNTER — Telehealth: Payer: Self-pay

## 2022-12-01 ENCOUNTER — Ambulatory Visit: Payer: Medicare Other | Admitting: Internal Medicine

## 2022-12-01 NOTE — Telephone Encounter (Signed)
Transition Care Management Follow-up Telephone Call Date of discharge and from where: 11/18/2022 [facility] How have you been since you were released from the hospital? Patient stated she is feeling much better. Any questions or concerns? No  Items Reviewed: Did the pt receive and understand the discharge instructions provided? Yes  Medications obtained and verified?  No medication prescribed. Other? No  Any new allergies since your discharge? No  Dietary orders reviewed? Yes Do you have support at home? Yes   Follow up appointments reviewed:  PCP Hospital f/u appt confirmed? Yes  Scheduled to see Megan [name]. Megan Needle, MD on 12/26/2022 @ [facility] [place]. Specialist Hospital f/u appt confirmed?  Patient stated she has seen a Neurologist.  Scheduled to see  on  @ . Are transportation arrangements needed? No  If their condition worsens, is the pt aware to call PCP or go to the Emergency Dept.? Yes Was the patient provided with contact information for the PCP's office or ED? Yes Was to pt encouraged to call back with questions or concerns? Yes   Megan [name] [facility]  [facility], [facility] Guide Direct Dial: [phone]  Website: [email or web address]

## 2022-12-26 ENCOUNTER — Ambulatory Visit (INDEPENDENT_AMBULATORY_CARE_PROVIDER_SITE_OTHER): Payer: Medicare Other | Admitting: Family Medicine

## 2022-12-26 ENCOUNTER — Encounter: Payer: Self-pay | Admitting: Family Medicine

## 2022-12-26 VITALS — BP 124/78 | HR 80 | Temp 98.2°F | Ht 64.0 in | Wt 188.4 lb

## 2022-12-26 DIAGNOSIS — Z7984 Long term (current) use of oral hypoglycemic drugs: Secondary | ICD-10-CM

## 2022-12-26 DIAGNOSIS — D649 Anemia, unspecified: Secondary | ICD-10-CM

## 2022-12-26 DIAGNOSIS — E119 Type 2 diabetes mellitus without complications: Secondary | ICD-10-CM | POA: Diagnosis not present

## 2022-12-26 DIAGNOSIS — I10 Essential (primary) hypertension: Secondary | ICD-10-CM | POA: Diagnosis not present

## 2022-12-26 MED ORDER — JANUMET 50-1000 MG PO TABS: 1.0000 | ORAL_TABLET | Freq: Every day | ORAL | 3 refills | Status: DC

## 2022-12-26 MED ORDER — LOSARTAN POTASSIUM 100 MG PO TABS: ORAL_TABLET | ORAL | 1 refills | Status: DC

## 2022-12-26 MED ORDER — AMLODIPINE BESYLATE 10 MG PO TABS: 10.0000 mg | ORAL_TABLET | Freq: Every day | ORAL | 1 refills | Status: DC

## 2022-12-26 NOTE — Progress Notes (Signed)
Established Patient Office Visit  Subjective   Patient ID: Megan [name], female    DOB: July 08, 1955  Age: 67 y.o. MRN: [id number]  Chief Complaint  Patient presents with   Medical Management of Chronic Issues   Fall    Patient states she fell down steps 4 days ago, onto the left knee, bruising noted     Pt is here for 6 month follow up  Pt states she was working in yard and she fell and hit her left knee. States that in the past 4 days she did develop a bruise on the left knee but there is no pain with bearing weight. There was no loss of consciousness, no other associated symptoms and it appears to be getting better over time.   HTN -- BP in office performed and is well controlled. She  reports no side effects to the medications, no chest pain, SOB, dizziness or headaches. She has a BP cuff at home and is checking BP regularly, reports they are in the normal range.    Diabetes-- Is following with Dr. Lonzo Cloud, she needs refills on her janumet today and also is due for her microalbumin testing. She reports she had her eye exam already this year. Reviewed last A1C in November which was well controlled at 6.8.  Anemia-- Hb remains at 11.5, pt states she is having trouble tolerating the oral iron pills due ot severe constipation. Will recheck iron panel today.     Current Outpatient Medications  Medication Instructions   amLODipine (NORVASC) 10 mg, Oral, Daily   amoxicillin (AMOXIL) 875 mg, Oral, 2 times daily   b complex vitamins tablet 1 tablet, Oral, Daily   Cholecalciferol (VITAMIN D3) 250 MCG (10000 UT) TABS Oral, Daily   Fish Oil-Cholecalciferol (FISH OIL + D3 PO) 1,200 mg, Oral, Daily   fluticasone (FLONASE) 50 MCG/ACT nasal spray SHAKE LIQUID AND USE 2 SPRAYS IN EACH NOSTRIL DAILY   glimepiride (AMARYL) 1 mg, Oral, Daily with breakfast   losartan (COZAAR) 100 MG tablet TAKE 1 TABLET BY MOUTH EVERY DAY   Multiple Vitamin (MULTIVITAMIN) tablet 1 tablet, Oral, Daily, OVER  50 VITAMIN   sitaGLIPtin-metformin (JANUMET) 50-1000 MG tablet 1 tablet, Oral, Daily    Patient Active Problem List   Diagnosis Date Noted   Atypical chest pain 01/19/2022   DDD (degenerative disc disease), lumbar 08/10/2021   Low back pain with radiation 12/21/2020   Preventative health care 12/21/2020   At risk for cardiovascular event 11/23/2020   Type 2 diabetes mellitus without complication, without long-term current use of insulin (HCC) 11/23/2020   Multinodular goiter 11/23/2020   Generalized abdominal pain 02/19/2020   History of hernia repair 02/19/2020   Hair loss 02/17/2020   Controlled diabetes mellitus type 2 with complications (HCC) 02/17/2020   Diabetes mellitus without complication (HCC) 11/11/2019   Type 2 diabetes mellitus with hyperglycemia, without long-term current use of insulin (HCC) 09/02/2019   Dyslipidemia 09/02/2019   Uncontrolled type 2 diabetes mellitus with hyperglycemia (HCC) 11/05/2018   Fatigue 11/05/2018   Acute non-recurrent pansinusitis 04/25/2018   Bronchitis 05/21/2017   CAP (community acquired pneumonia) 10/15/2013   Wheezing 09/29/2013   Pain, knee 10/02/2012   Gastrocnemius muscle strain 10/02/2012   Wrist sprain 10/02/2012   Thyroid nodule 05/03/2012   Rash and nonspecific skin eruption 03/14/2012   Alopecia 03/28/2010   History of colonic polyps 11/09/2009   HEMANGIOMA, HEPATIC 11/04/2009   UNSPECIFIED ANEMIA 11/04/2009   DIVERTICULOSIS, COLON 11/04/2009   FATTY  LIVER DISEASE 11/04/2009   PALPITATIONS 09/06/2009   Chronic fatigue 08/07/2008   Allergic rhinitis 04/10/2007   Diabetes mellitus type II, controlled (HCC) 12/21/2006   Hyperlipidemia 04/12/2006   HTN (hypertension) 04/12/2006      Review of Systems  All other systems reviewed and are negative.     Objective:     BP 124/78 (BP Location: Left Arm, Patient Position: Sitting, Cuff Size: Normal)   Pulse 80   Temp 98.2 F (36.8 C) (Oral)   Ht 5\' 4"  (1.626 m)    Wt 188 lb 6.4 oz (85.5 kg)   SpO2 98%   BMI 32.34 kg/m    Physical Exam Vitals reviewed.  Constitutional:      Appearance: Normal appearance. She is well-groomed and normal weight.  Eyes:     Conjunctiva/sclera: Conjunctivae normal.  Neck:     Thyroid: No thyromegaly.  Cardiovascular:     Rate and Rhythm: Normal rate and regular rhythm.     Pulses: Normal pulses.     Heart sounds: S1 normal and S2 normal.  Pulmonary:     Effort: Pulmonary effort is normal.     Breath sounds: Normal breath sounds and air entry.  Abdominal:     General: Bowel sounds are normal.  Musculoskeletal:     Right lower leg: No edema.     Left lower leg: No edema.  Neurological:     Mental Status: She is alert and oriented to person, place, and time. Mental status is at baseline.     Gait: Gait is intact.  Psychiatric:        Mood and Affect: Mood and affect normal.        Speech: Speech normal.        Behavior: Behavior normal.        Judgment: Judgment normal.      No results found for any visits on 12/26/22.    The 10-year ASCVD risk score (Arnett DK, et al., 2019) is: 20.5%    Assessment & Plan:  Anemia, unspecified type Assessment & Plan: CBC in November showed continued mild anemia, will check new iron panel  Orders: -     Iron, TIBC and Ferritin Panel  Type 2 diabetes mellitus without complication, without long-term current use of insulin (HCC) Assessment & Plan: Needs refills on janumet today, orders placed, chronic well controlled, also needs urine microalbumin done.   Orders: -     Microalbumin / creatinine urine ratio -     Janumet; Take 1 tablet by mouth daily at 6 (six) AM.  Dispense: 90 tablet; Refill: 3  Primary hypertension Assessment & Plan: Current hypertension medications:       Sig   amLODipine (NORVASC) 10 MG tablet Take 1 tablet (10 mg total) by mouth daily.   losartan (COZAAR) 100 MG tablet TAKE 1 TABLET BY MOUTH EVERY DAY      Chronic, stable and  controlled, continue the above medications as prescribed  Orders: -     amLODIPine Besylate; Take 1 tablet (10 mg total) by mouth daily.  Dispense: 90 tablet; Refill: 1 -     Losartan Potassium; TAKE 1 TABLET BY MOUTH EVERY DAY  Dispense: 90 tablet; Refill: 1     Return in about 6 months (around 06/26/2023).    Karie Georges, MD

## 2022-12-26 NOTE — Assessment & Plan Note (Signed)
Current hypertension medications:       Sig   amLODipine (NORVASC) 10 MG tablet Take 1 tablet (10 mg total) by mouth daily.   losartan (COZAAR) 100 MG tablet TAKE 1 TABLET BY MOUTH EVERY DAY      Chronic, stable and controlled, continue the above medications as prescribed

## 2022-12-26 NOTE — Assessment & Plan Note (Signed)
CBC in November showed continued mild anemia, will check new iron panel

## 2022-12-26 NOTE — Assessment & Plan Note (Signed)
Needs refills on janumet today, orders placed, chronic well controlled, also needs urine microalbumin done.

## 2022-12-27 ENCOUNTER — Other Ambulatory Visit: Payer: Self-pay | Admitting: Neurosurgery

## 2022-12-27 DIAGNOSIS — D329 Benign neoplasm of meninges, unspecified: Secondary | ICD-10-CM

## 2022-12-27 LAB — MICROALBUMIN / CREATININE URINE RATIO
Creatinine,U: 73 mg/dL
Microalb Creat Ratio: 8.2 mg/g (ref 0.0–30.0)
Microalb, Ur: 6 mg/dL — ABNORMAL HIGH (ref 0.0–1.9)

## 2022-12-27 LAB — IRON,TIBC AND FERRITIN PANEL
%SAT: 24 % (ref 16–45)
Ferritin: 78 ng/mL (ref 16–288)
Iron: 61 ug/dL (ref 45–160)
TIBC: 257 ug/dL (ref 250–450)

## 2023-01-31 ENCOUNTER — Other Ambulatory Visit (HOSPITAL_COMMUNITY): Payer: Self-pay

## 2023-02-04 ENCOUNTER — Ambulatory Visit
Admission: RE | Admit: 2023-02-04 | Discharge: 2023-02-04 | Disposition: A | Discharge status: Home / Self Care | Payer: Medicare Other | Source: Ambulatory Visit | Attending: Neurosurgery | Admitting: Neurosurgery

## 2023-02-04 DIAGNOSIS — D329 Benign neoplasm of meninges, unspecified: Secondary | ICD-10-CM

## 2023-02-04 DIAGNOSIS — D320 Benign neoplasm of cerebral meninges: Secondary | ICD-10-CM | POA: Diagnosis not present

## 2023-02-04 MED ORDER — GADOPICLENOL 0.5 MMOL/ML IV SOLN
9.0000 mL | Freq: Once | INTRAVENOUS | Status: AC | PRN
  Administered 2023-02-04: 9 mL via INTRAVENOUS

## 2023-02-06 DIAGNOSIS — D329 Benign neoplasm of meninges, unspecified: Secondary | ICD-10-CM | POA: Diagnosis not present

## 2023-02-26 ENCOUNTER — Other Ambulatory Visit: Payer: Self-pay | Admitting: Family Medicine

## 2023-02-26 DIAGNOSIS — N6310 Unspecified lump in the right breast, unspecified quadrant: Secondary | ICD-10-CM

## 2023-02-26 DIAGNOSIS — N6459 Other signs and symptoms in breast: Secondary | ICD-10-CM | POA: Diagnosis not present

## 2023-03-01 DIAGNOSIS — I70203 Unspecified atherosclerosis of native arteries of extremities, bilateral legs: Secondary | ICD-10-CM | POA: Diagnosis not present

## 2023-03-01 DIAGNOSIS — M19072 Primary osteoarthritis, left ankle and foot: Secondary | ICD-10-CM | POA: Diagnosis not present

## 2023-03-01 DIAGNOSIS — M19071 Primary osteoarthritis, right ankle and foot: Secondary | ICD-10-CM | POA: Diagnosis not present

## 2023-03-01 DIAGNOSIS — D2371 Other benign neoplasm of skin of right lower limb, including hip: Secondary | ICD-10-CM | POA: Diagnosis not present

## 2023-03-01 DIAGNOSIS — E1151 Type 2 diabetes mellitus with diabetic peripheral angiopathy without gangrene: Secondary | ICD-10-CM | POA: Diagnosis not present

## 2023-03-01 DIAGNOSIS — M205X1 Other deformities of toe(s) (acquired), right foot: Secondary | ICD-10-CM | POA: Diagnosis not present

## 2023-03-02 ENCOUNTER — Ambulatory Visit
Admission: RE | Admit: 2023-03-02 | Discharge: 2023-03-02 | Disposition: A | Discharge status: Home / Self Care | Payer: Medicare Other | Source: Ambulatory Visit | Attending: Family Medicine | Admitting: Family Medicine

## 2023-03-02 DIAGNOSIS — N644 Mastodynia: Secondary | ICD-10-CM | POA: Diagnosis not present

## 2023-03-02 DIAGNOSIS — N6310 Unspecified lump in the right breast, unspecified quadrant: Secondary | ICD-10-CM

## 2023-03-22 DIAGNOSIS — D329 Benign neoplasm of meninges, unspecified: Secondary | ICD-10-CM | POA: Diagnosis not present

## 2023-03-22 DIAGNOSIS — E1129 Type 2 diabetes mellitus with other diabetic kidney complication: Secondary | ICD-10-CM | POA: Diagnosis not present

## 2023-03-22 DIAGNOSIS — I129 Hypertensive chronic kidney disease with stage 1 through stage 4 chronic kidney disease, or unspecified chronic kidney disease: Secondary | ICD-10-CM | POA: Diagnosis not present

## 2023-03-22 DIAGNOSIS — I10 Essential (primary) hypertension: Secondary | ICD-10-CM | POA: Diagnosis not present

## 2023-03-22 DIAGNOSIS — E1122 Type 2 diabetes mellitus with diabetic chronic kidney disease: Secondary | ICD-10-CM | POA: Diagnosis not present

## 2023-03-22 DIAGNOSIS — N181 Chronic kidney disease, stage 1: Secondary | ICD-10-CM | POA: Diagnosis not present

## 2023-03-22 DIAGNOSIS — R3129 Other microscopic hematuria: Secondary | ICD-10-CM | POA: Diagnosis not present

## 2023-03-22 DIAGNOSIS — R809 Proteinuria, unspecified: Secondary | ICD-10-CM | POA: Diagnosis not present

## 2023-03-23 LAB — LAB REPORT - SCANNED
Albumin, Urine POC: 49.5
Albumin/Creatinine Ratio, Urine, POC: 29
Creatinine, POC: 171.9 mg/dL
EGFR: 70

## 2023-04-03 ENCOUNTER — Ambulatory Visit (INDEPENDENT_AMBULATORY_CARE_PROVIDER_SITE_OTHER): Admitting: Family Medicine

## 2023-04-03 ENCOUNTER — Encounter: Payer: Self-pay | Admitting: Family Medicine

## 2023-04-03 VITALS — BP 124/62 | HR 85 | Temp 98.4°F | Ht 64.0 in | Wt 191.0 lb

## 2023-04-03 DIAGNOSIS — J40 Bronchitis, not specified as acute or chronic: Secondary | ICD-10-CM

## 2023-04-03 MED ORDER — FLUCONAZOLE 150 MG PO TABS: 150.0000 mg | ORAL_TABLET | ORAL | 2 refills | Status: DC | PRN

## 2023-04-03 MED ORDER — AMOXICILLIN-POT CLAVULANATE 875-125 MG PO TABS: 1.0000 | ORAL_TABLET | Freq: Two times a day (BID) | ORAL | 0 refills | Status: DC

## 2023-04-03 NOTE — Progress Notes (Signed)
   Subjective:    Patient ID: Megan [name], female    DOB: 02-Jun-1955, 68 y.o.   MRN: [id number]  HPI Here for 3 days of sinus pressure, PND, chest congestion, and coughing up yellow sputum. No fever or SOB.    Review of Systems  Constitutional: Negative.   HENT:  Positive for congestion, postnasal drip and sinus pressure. Negative for ear pain and sore throat.   Eyes: Negative.   Respiratory:  Positive for cough and chest tightness. Negative for shortness of breath and wheezing.        Objective:   Physical Exam Constitutional:      Appearance: Normal appearance.  HENT:     Right Ear: Tympanic membrane, ear canal and external ear normal.     Left Ear: Tympanic membrane, ear canal and external ear normal.     Nose: Nose normal.     Mouth/Throat:     Pharynx: Oropharynx is clear.  Eyes:     Conjunctiva/sclera: Conjunctivae normal.  Pulmonary:     Effort: Pulmonary effort is normal.     Breath sounds: Wheezing and rhonchi present. No rales.  Lymphadenopathy:     Cervical: No cervical adenopathy.  Neurological:     Mental Status: She is alert.           Assessment & Plan:  Bronchitis, treat with 10 days of Augmentin.  Gershon Crane, MD

## 2023-04-25 ENCOUNTER — Ambulatory Visit (INDEPENDENT_AMBULATORY_CARE_PROVIDER_SITE_OTHER)

## 2023-04-25 ENCOUNTER — Encounter: Payer: Self-pay | Admitting: Family Medicine

## 2023-04-25 ENCOUNTER — Ambulatory Visit (INDEPENDENT_AMBULATORY_CARE_PROVIDER_SITE_OTHER): Admitting: Family Medicine

## 2023-04-25 VITALS — BP 132/70 | HR 82 | Temp 98.7°F | Wt 192.0 lb

## 2023-04-25 DIAGNOSIS — R052 Subacute cough: Secondary | ICD-10-CM | POA: Diagnosis not present

## 2023-04-25 DIAGNOSIS — R059 Cough, unspecified: Secondary | ICD-10-CM | POA: Diagnosis not present

## 2023-04-25 DIAGNOSIS — J40 Bronchitis, not specified as acute or chronic: Secondary | ICD-10-CM

## 2023-04-25 MED ORDER — HYDROCODONE BIT-HOMATROP MBR 5-1.5 MG/5ML PO SOLN: 5.0000 mL | ORAL | 0 refills | Status: DC | PRN

## 2023-04-25 MED ORDER — AZITHROMYCIN 250 MG PO TABS: ORAL_TABLET | ORAL | 0 refills | Status: DC

## 2023-04-25 NOTE — Progress Notes (Signed)
   Subjective:    Patient ID: Megan [name], female    DOB: October 25, 1955, 68 y.o.   MRN: [id number]  HPI Here to follow up on a visit here on 04-03-23 for a cough that produced yellow sputum. No SOB or fever that day. She was treated with 10 days of Augmentin, and she feels a little better but she is still coughing hard. The cough is dry now. She also has a new pain in the right middle back area.    Review of Systems  Constitutional: Negative.   HENT: Negative.    Eyes: Negative.   Respiratory:  Positive for cough. Negative for shortness of breath and wheezing.   Cardiovascular:  Negative for chest pain.  Musculoskeletal:  Positive for back pain.       Objective:   Physical Exam Constitutional:      Appearance: Normal appearance.  Cardiovascular:     Rate and Rhythm: Normal rate and regular rhythm.     Pulses: Normal pulses.     Heart sounds: Normal heart sounds.  Pulmonary:     Effort: Pulmonary effort is normal.     Breath sounds: Normal breath sounds.  Musculoskeletal:     Comments: She is mildly tender in the right middle back area   Neurological:     Mental Status: She is alert.           Assessment & Plan:  Partially treated bronchitis. We will treat with a Zpack. Use Hycodan for the cough. The back pain is muscular in nature, and it is likely the result of coughing. We will get a CXR today. Gershon Crane, MD

## 2023-04-27 ENCOUNTER — Encounter: Payer: Self-pay | Admitting: *Deleted

## 2023-05-01 ENCOUNTER — Encounter: Payer: Self-pay | Admitting: Dermatology

## 2023-05-01 ENCOUNTER — Ambulatory Visit: Payer: Medicare Other | Admitting: Dermatology

## 2023-05-01 VITALS — BP 134/65

## 2023-05-01 DIAGNOSIS — D485 Neoplasm of uncertain behavior of skin: Secondary | ICD-10-CM

## 2023-05-01 DIAGNOSIS — D492 Neoplasm of unspecified behavior of bone, soft tissue, and skin: Secondary | ICD-10-CM | POA: Diagnosis not present

## 2023-05-01 DIAGNOSIS — R21 Rash and other nonspecific skin eruption: Secondary | ICD-10-CM | POA: Diagnosis not present

## 2023-05-01 DIAGNOSIS — L720 Epidermal cyst: Secondary | ICD-10-CM | POA: Diagnosis not present

## 2023-05-01 DIAGNOSIS — L309 Dermatitis, unspecified: Secondary | ICD-10-CM

## 2023-05-01 MED ORDER — TRETINOIN 0.025 % EX CREA: TOPICAL_CREAM | Freq: Every day | CUTANEOUS | 3 refills | Status: DC

## 2023-05-01 NOTE — Patient Instructions (Addendum)
 Hello Megan [name],  Thank you for visiting today. Here is a summary of the key instructions:  - Medications:   - Use tretinoin cream 2 nights a week for the first 2 weeks   - Apply a pea-sized amount at night after cleansing   - Put moisturizer on top after applying tretinoin   - After 2 weeks, if no irritation, increase to 3 nights a week  - Wound Care for biopsy site:   - Apply Aquaphor and a Band-Aid to the biopsy site for 1 week  - Skin Care:   - Continue using current cleanser and moisturizer   - Consider reducing use of hair oil  - Follow-up:   - Return in 8 weeks to review biopsy results and treatment response   - Discuss hair thinning treatment at follow-up appointment  We look forward to seeing you at your next visit. If you have any questions or concerns before then, please do not hesitate to contact our office.  Warm regards,  Dr. Langston Reusing, Dermatology     Patient Handout: Wound Care for Skin Biopsy Site  Taking Care of Your Skin Biopsy Site  Proper care of the biopsy site is essential for promoting healing and minimizing scarring. This handout provides instructions on how to care for your biopsy site to ensure optimal recovery.  1. Cleaning the Wound:  Clean the biopsy site daily with gentle soap and water. Gently pat the area dry with a clean, soft towel. Avoid harsh scrubbing or rubbing the area, as this can irritate the skin and delay healing.  2. Applying Aquaphor and Bandage:  After cleaning the wound, apply a thin layer of Aquaphor ointment to the biopsy site. Cover the area with a sterile bandage to protect it from dirt, bacteria, and friction. Change the bandage daily or as needed if it becomes soiled or wet.  3. Continued Care for One Week:  Repeat the cleaning, Aquaphor application, and bandaging process daily for one week following the biopsy procedure. Keeping the wound clean and moist during this initial healing period will help prevent  infection and promote optimal healing.  4. Massaging Aquaphor into the Area:  ---After one week, discontinue the use of bandages but continue to apply Aquaphor to the biopsy site. ----Gently massage the Aquaphor into the area using circular motions. ---Massaging the skin helps to promote circulation and prevent the formation of scar tissue.   Additional Tips:  Avoid exposing the biopsy site to direct sunlight during the healing process, as this can cause hyperpigmentation or worsen scarring. If you experience any signs of infection, such as increased redness, swelling, warmth, or drainage from the wound, contact your healthcare provider immediately. Follow any additional instructions provided by your healthcare provider for caring for the biopsy site and managing any discomfort. Conclusion:  Taking proper care of your skin biopsy site is crucial for ensuring optimal healing and minimizing scarring. By following these instructions for cleaning, applying Aquaphor, and massaging the area, you can promote a smooth and successful recovery. If you have any questions or concerns about caring for your biopsy site, don't hesitate to contact your healthcare provider for guidance.     Topical retinoid medications like tretinoin/Retin-A, adapalene/Differin, tazarotene/Fabior, and Epiduo/Epiduo Forte can cause dryness and irritation when first started. Only apply a pea-sized amount to the entire affected area. Avoid applying it around the eyes, edges of mouth and creases at the nose. If you experience irritation, use a good moisturizer first and/or apply the medicine  less often. If you are doing well with the medicine, you can increase how often you use it until you are applying every night. Be careful with sun protection while using this medication as it can make you sensitive to the sun. This medicine should not be used by pregnant women.     Important Information  Due to recent changes in healthcare  laws, you may see results of your pathology and/or laboratory studies on MyChart before the doctors have had a chance to review them. We understand that in some cases there may be results that are confusing or concerning to you. Please understand that not all results are received at the same time and often the doctors may need to interpret multiple results in order to provide you with the best plan of care or course of treatment. Therefore, we ask that you please give [place] 2 business days to thoroughly review all your results before contacting the office for clarification. Should we see a critical lab result, you will be contacted sooner.   If You Need Anything After Your Visit  If you have any questions or concerns for your doctor, please call our main line at [phone] If no one answers, please leave a voicemail as directed and we will return your call as soon as possible. Messages left after 4 pm will be answered the following business day.   You may also send [place] a message via MyChart. We typically respond to MyChart messages within 1-2 business days.  For prescription refills, please ask your pharmacy to contact our office. Our fax number is [phone].  If you have an urgent issue when the clinic is closed that cannot wait until the next business day, you can page your doctor at the number below.    Please note that while we do our best to be available for urgent issues outside of office hours, we are not available 24/7.   If you have an urgent issue and are unable to reach [place], you may choose to seek medical care at your doctor's office, retail clinic, urgent care center, or emergency room.  If you have a medical emergency, please immediately call 911 or go to the emergency department. In the event of inclement weather, please call our main line at [phone] for an update on the status of any delays or closures.  Dermatology Medication Tips: Please keep the boxes that topical  medications come in in order to help keep track of the instructions about where and how to use these. Pharmacies typically print the medication instructions only on the boxes and not directly on the medication tubes.   If your medication is too expensive, please contact our office at [phone] or send [place] a message through MyChart.   We are unable to tell what your co-pay for medications will be in advance as this is different depending on your insurance coverage. However, we may be able to find a substitute medication at lower cost or fill out paperwork to get insurance to cover a needed medication.   If a prior authorization is required to get your medication covered by your insurance company, please allow [place] 1-2 business days to complete this process.  Drug prices often vary depending on where the prescription is filled and some pharmacies may offer cheaper prices.  The website www.goodrx.com contains coupons for medications through different pharmacies. The prices here do not account for what the cost may be with help from insurance (it may be cheaper with your  insurance), but the website can give you the price if you did not use any insurance.  - You can print the associated coupon and take it with your prescription to the pharmacy.  - You may also stop by our office during regular business hours and pick up a GoodRx coupon card.  - If you need your prescription sent electronically to a different pharmacy, notify our office through [facility] or by phone at [phone]

## 2023-05-01 NOTE — Progress Notes (Signed)
 New Patient Visit   Subjective  Megan [name] is a 68 y.o. female who presents for the following: Rash behind ears that has come up on her face. It has been there for years but it does not bother her. She just wants to get it checked because people have started to notice it.  Megan [name] presents with skin irritation that started behind her ears and has been spreading to her chin and scalp. The patient reports that the condition began a couple of years ago but has been spreading more recently.  The patient describes the irritation as "fine little bumps" that do not hurt, burn, or itch. She notes that she does not shave, ruling out razor burn as a potential cause. The patient has recently started applying oil to her hair after showering, which may be contributing to the condition. Her current skincare routine includes using an astringent and a cleanser called Splash, followed by Alcus Humbles moisturizer.  In addition to the skin irritation, the patient mentions experiencing hair thinning, though this is not her primary concern during this visit. She also notes that her son has alopecia, indicating a possible family history of hair-related issues.  The patient has not attempted any specific treatments for the skin irritation prior to this visit. She appears open to treatment options and agrees to a biopsy of one of the bumps for further evaluation.    The following portions of the chart were reviewed this encounter and updated as appropriate: medications, allergies, medical history  Review of Systems:  No other skin or systemic complaints except as noted in HPI or Assessment and Plan.  Objective  Well appearing patient in no apparent distress; mood and affect are within normal limits.  A focused examination was performed of the following areas: Face, postauricular areas   Relevant exam findings are noted in the Assessment and Plan.            Left Postauricular  Area Numerous scattered small firm flesh colored papules involving the neck and face   Assessment & Plan   1. Rash / Bumps Unspecified  - Assessment: Fine bumps initially appeared behind the ears and have spread to the chin and scalp. Lesions are asymptomatic, with no associated pain, burning, or itching. Condition present for a couple of years but has recently progressed. Differential diagnoses include milia or syringoma. - Plan:    Perform shave biopsy     Prescribe tretinoin  (topical retinoid) for prevention of new lesions     - Start with application two nights per week for the first two weeks     - If no irritation, increase to three nights per week     - Apply pea-sized amount at night after face washing, followed by moisturizer    Recommend reduction in use of hair oils    Apply Aquaphor and Band-Aid to biopsy site for one week    Follow up in 8 weeks to review biopsy results and response to tretinoin     Discuss potential cosmetic procedures for existing lesions at follow-up (not covered by insurance)  2. Reported hair thinning - Assessment: Patient reports hair thinning, noted to be common in women, especially after menopause. Further evaluation needed to determine cause and appropriate treatment. - Plan:    Address hair thinning at next visit    Develop regimen to stop hair loss and promote regrowth at follow-up appointment  NEOPLASM OF UNCERTAIN BEHAVIOR OF SKIN Left Postauricular Area Skin / nail biopsy Type  of biopsy: tangential   Informed consent: discussed and consent obtained   Timeout: patient name, date of birth, surgical site, and procedure verified   Procedure prep:  Patient was prepped and draped in usual sterile fashion Prep type:  Isopropyl alcohol Anesthesia: the lesion was anesthetized in a standard fashion   Anesthetic:  1% lidocaine  w/ epinephrine  1-100,000 buffered w/ 8.4% NaHCO3 Instrument used: flexible razor blade   Hemostasis achieved with: pressure,  aluminum chloride and electrodesiccation   Outcome: patient tolerated procedure well   Post-procedure details: sterile dressing applied and wound care instructions given   Dressing type: bandage and petrolatum    tretinoin  (RETIN-A ) 0.025 % cream Apply topically at bedtime. Apply pea size amount to affected areas of face and neck at bedtime 2 nights per week x 2 weeks then increase to 3 nights per week if tolerated. Specimen 1 - Surgical pathology Differential Diagnosis: Milium vs syringoma vs other   Check Margins: No Tretinoin  0.025% cream Apply pea size amount to affected area of face and neck at bedtime 2 nights per week then increase to 3 nights per week if tolerated.   ANDROGENETIC ALOPECIA (FEMALE PATTERN HAIR LOSS) Exam: Diffuse thinning of the crown and widening of the midline part with retention of the frontal hairline   Treatment Plan: We will discuss treatment options on follow up.   Return in about 8 weeks (around 06/26/2023) for Follow up.  I, Eliot Guernsey, CMA, am acting as scribe for [employer], DO .   Documentation: I have reviewed the above documentation for accuracy and completeness, and I agree with the above.  Louana Roup, DO

## 2023-05-07 LAB — SURGICAL PATHOLOGY

## 2023-05-09 ENCOUNTER — Encounter: Payer: Self-pay | Admitting: Dermatology

## 2023-05-09 ENCOUNTER — Other Ambulatory Visit: Payer: Self-pay

## 2023-05-09 DIAGNOSIS — D485 Neoplasm of uncertain behavior of skin: Secondary | ICD-10-CM

## 2023-05-09 MED ORDER — TRETINOIN 0.025 % EX CREA: TOPICAL_CREAM | Freq: Every day | CUTANEOUS | 3 refills | Status: AC

## 2023-05-09 NOTE — Progress Notes (Signed)
 Cancelled prescription at [facility] and sent to [facility] Advised patient.

## 2023-05-25 NOTE — Progress Notes (Unsigned)
 Name: Megan [name]  Age/ Sex: 68 y.o., female   MRN/ DOB: [id number], [birth year]     PCP: Aida House, MD   Reason for Endocrinology Evaluation: Type 2 Diabetes Mellitus  Initial Endocrine Consultative Visit: 09/02/2019    Megan [name] IDENTIFIER: Megan [name] is a 68 y.o. female with a past medical history of T2DM, HTN and Dyslipidemia. The Megan [name] has followed with Endocrinology clinic since 09/02/2019 for consultative assistance with management of her diabetes.  DIABETIC HISTORY:  Megan [name] was diagnosed with DM in 2008.She has been on Metformin , Glimepiride , repaglinide  and Januvia  . Her hemoglobin A1c has ranged from 6.5% in 2014, peaking at 7.4% in 10/2018  On her initial visit to our clinic she has an A1c of 7.3% . She was on Repaglinide  which we stopped due to hypoglycemia, Decreased Glimepiride  and continued Janumet .   SUBJECTIVE:   During the last visit (11/23/2022): A1c 6.8 %     Today (05/25/2023): Megan [name] is here for a follow up on diabetes management.  She checks her blood sugars 1 times daily. The Megan [name] has not had hypoglycemic episodes since the last clinic visit.  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 She was diagnosed with meningioma on MRI 11/2022, she follows with neurosurgery, she had repeat MRI 01/2023 showing unchanged meningioma She has sharp headaches occasionally   She has allergy symptoms and attributes hyperglycemia  Denies nausea or vomiting  Denies diarrhea but has occasional constipation    HOME DIABETES REGIMEN:  Janumet  50-1000 mg daily  Glimepiride  1 mg , 1 tabs daily     Statin: yes  ACE-I/ARB: yes    METER DOWNLOAD SUMMARY: Unable to download  129- 165 mg/dL   DIABETIC COMPLICATIONS: Microvascular complications:   Denies: CKD, retinopathy, neuropathy Last Eye Exam: Completed 06/24/2021    Macrovascular complications:   Denies: CAD, CVA, PVD   HISTORY:  Past Medical  History:  Past Medical History:  Diagnosis Date   Allergy    Arthritis    fingers   Colon polyp    Diabetes mellitus    Diverticulosis    GERD (gastroesophageal reflux disease)    occasionally   H/O hiatal hernia    ?   Hemorrhoids    Hyperlipidemia    takes fish oil   Hypertension    Infectious colitis    Ventral hernia    Past Surgical History:  Past Surgical History:  Procedure Laterality Date   colonscopy     DIAGNOSTIC LAPAROSCOPY     FOOT SURGERY Bilateral    hammer toes   HEMORRHOID SURGERY     HERNIA REPAIR  01/20/2011   incisional   INCISIONAL HERNIA REPAIR  01/20/2011   Procedure: LAPAROSCOPIC INCISIONAL HERNIA;  Surgeon: Gorman Laughter, DO;  Location: [facility];  Service: General;  Laterality: N/A;  Laparoscopic repair of a recurrent incisional hernia with Mesh   INGUINAL HERNIA REPAIR     x's 3   VENTRAL HERNIA REPAIR     Social History:  reports that she quit smoking about 27 years ago. Her smoking use included cigarettes. She started smoking about 47 years ago. She has a 20 pack-year smoking history. She has never used smokeless tobacco. She reports current alcohol use of about 4.0 standard drinks of alcohol per week. She reports that she does not use drugs. Family History:  Family History  Problem Relation Age of Onset   Breast cancer Mother    Diabetes  Mother    Dementia Mother    Liver cancer Mother    Heart disease Father        heart attack   Diabetes Brother    Breast cancer Sister    Colon cancer Neg Hx    Stomach cancer Neg Hx      HOME MEDICATIONS: Allergies as of 05/28/2023       Reactions   Aspirin Nausea Only        Medication List        Accurate as of May 25, 2023  3:14 PM. If you have any questions, ask your nurse or doctor.          amLODipine  10 MG tablet Commonly known as: NORVASC  Take 1 tablet (10 mg total) by mouth daily.   azithromycin  250 MG tablet Commonly known as: Zithromax  Z-Pak As directed   b  complex vitamins tablet Take 1 tablet by mouth daily.   FISH OIL + D3 PO Take 1,200 mg by mouth daily.   fluticasone  50 MCG/ACT nasal spray Commonly known as: FLONASE  SHAKE LIQUID AND USE 2 SPRAYS IN EACH NOSTRIL DAILY   glimepiride  1 MG tablet Commonly known as: AMARYL  Take 1 tablet (1 mg total) by mouth daily with breakfast.   HYDROcodone  bit-homatropine 5-1.5 MG/5ML syrup Commonly known as: HYCODAN Take 5 mLs by mouth every 4 (four) hours as needed.   Janumet  50-1000 MG tablet Generic drug: sitaGLIPtin -metformin  Take 1 tablet by mouth daily at 6 (six) AM.   losartan  100 MG tablet Commonly known as: COZAAR  TAKE 1 TABLET BY MOUTH EVERY DAY   multivitamin tablet Take 1 tablet by mouth daily. OVER 50 VITAMIN   tretinoin  0.025 % cream Commonly known as: RETIN-A  Apply topically at bedtime. Apply pea size amount to affected areas of face and neck at bedtime 2 nights per week x 2 weeks then increase to 3 nights per week if tolerated.   Vitamin D3 250 MCG (10000 UT) Tabs Take by mouth daily.         OBJECTIVE:   Vital Signs: There were no vitals taken for this visit.  Wt Readings from Last 3 Encounters:  04/25/23 192 lb (87.1 kg)  04/03/23 191 lb (86.6 kg)  12/26/22 188 lb 6.4 oz (85.5 kg)     Exam: General: Pt appears well and is in NAD  Neck: General: Supple without adenopathy. Thyroid : Right thyroid  fullness   Lungs: Clear with good BS bilat   Heart: RRR   Extremities: No pretibial edema.  Neuro: MS is good with appropriate affect, pt is alert and Ox3    DM foot exam  05/29/2022 The skin of the feet is intact without sores or ulcerations. The pedal pulses are 2+ on right and 2+ on left. The sensation is intact to a screening 5.07, 10 gram monofilament bilaterally     DATA REVIEWED:  Lab Results  Component Value Date   HGBA1C 6.8 (A) 11/23/2022   HGBA1C 6.3 (A) 05/29/2022   HGBA1C 6.3 (A) 11/28/2021    Latest Reference Range & Units 11/17/22  21:54  Sodium 135 - 145 mmol/L 137  Potassium 3.5 - 5.1 mmol/L 3.7  Chloride 98 - 111 mmol/L 105  CO2 22 - 32 mmol/L 23  Glucose 70 - 99 mg/dL 161 (H)  BUN 8 - 23 mg/dL 13  Creatinine 0.96 - 0.45 mg/dL 4.09  Calcium  8.9 - 10.3 mg/dL 9.6  Anion gap 5 - 15  9  GFR, Estimated >60 mL/min >60  (  H): Data is abnormally high    Thyroid  ultrasound 05/30/2018   Estimated total number of nodules >/= 1 cm: 2   Number of spongiform nodules >/=  2 cm not described below (TR1): 0   Number of mixed cystic and solid nodules >/= 1.5 cm not described below (TR2): 0   _________________________________________________________   The previously biopsied mass in the right mid and lower gland measures 5.6 x 4.0 x 2.6 cm. This appears grossly stable compared to prior although measurements were obtained in slightly different planes and so precise measurements are more challenging. However, the mass measured up to 5.2 cm in June of 2015. There has been no significant mitral change over the past 5 years.   Stable appearance of a multiple subcentimeter nodules scattered throughout the left mid gland. None of the nodules meet criteria for biopsy or further imaging surveillance.   IMPRESSION: No significant interval change in the size or appearance of the previously biopsied nodule occupying the majority of the right gland. Five year stability is consistent with benignity.   Multiple small nodules scattered throughout the left mid gland remain unchanged and do not meet criteria for further evaluation.   The above is in keeping with the ACR TI-RADS recommendations - J Am Coll Radiol 2017;14:587-595.    Right Nodule FNA 05/16/2012  THYROID , FINE NEEDLE ASPIRATION, RIGHT BENIGN. FINDINGS CONSISTENT WITH NON-NEOPLASTIC GOITER.    Old records , labs and images have been reviewed.    ASSESSMENT / PLAN / RECOMMENDATIONS:   1) Type 2 Diabetes Mellitus, Optimally controlled, Without complications  - Most recent A1c of 6.8 %. Goal A1c < 7.0 %.    - A1c remains optimal  -In the past she has declined switching Januvia  to Rybelsus -Today we entertain the idea of injectable GLP-1 agonist as she was complaining of weight gain, but she does not want to do injectables -No changes   MEDICATIONS: - Continue Glimepiride  1 mg, one  tablet before Breakfast  - Continue  Janumet  50-1000 mg ONCE daily   EDUCATION / INSTRUCTIONS: BG monitoring instructions: Megan [name] is instructed to check her blood sugars 1 times a day, fasting . Call [facility] if: BG persistently < 70 I reviewed the Rule of 15 for the treatment of hypoglycemia in detail with the Megan [name]. Literature supplied.   2) Diabetic complications:  Eye: Does not have known diabetic retinopathy.  Neuro/ Feet: Does not have known diabetic peripheral neuropathy .  Renal: Megan [name] does not have known baseline CKD. She   is on an ACEI/ARB at present.      F/U in 6 months    Signed electronically by: Natale Bail, MD  [facility]  [facility] [address] [place] [place], [place] [address] Phone: [phone] FAX: [phone]   CC: Aida House, MD [address] [place] [address] Phone: [phone]  Fax: [phone]  Return to Endocrinology clinic as below: Future Appointments  Date Time Provider Department Center  05/28/2023  7:50 AM Jalyne Brodzinski, Julian Obey, MD [facility] None  07/30/2023  8:45 AM Dellar Fenton, DO [facility] None

## 2023-05-28 ENCOUNTER — Encounter: Payer: Self-pay | Admitting: Internal Medicine

## 2023-05-28 ENCOUNTER — Ambulatory Visit: Payer: Medicare Other | Admitting: Internal Medicine

## 2023-05-28 VITALS — BP 122/74 | HR 81 | Ht 64.0 in | Wt 187.0 lb

## 2023-05-28 DIAGNOSIS — E119 Type 2 diabetes mellitus without complications: Secondary | ICD-10-CM | POA: Diagnosis not present

## 2023-05-28 DIAGNOSIS — Z7984 Long term (current) use of oral hypoglycemic drugs: Secondary | ICD-10-CM

## 2023-05-28 LAB — POCT GLYCOSYLATED HEMOGLOBIN (HGB A1C): Hemoglobin A1C: 6.7 % — AB (ref 4.0–5.6)

## 2023-05-28 MED ORDER — GLIMEPIRIDE 1 MG PO TABS: 1.0000 mg | ORAL_TABLET | Freq: Every day | ORAL | 3 refills | Status: DC

## 2023-05-28 MED ORDER — JANUMET 50-1000 MG PO TABS: 1.0000 | ORAL_TABLET | Freq: Every day | ORAL | 3 refills | Status: DC

## 2023-05-28 NOTE — Patient Instructions (Signed)
-   Continue Glimepiride 1 mg, one  tablet before Breakfast  - Continue Janumet 50-1000 mg ONCE daily      HOW TO TREAT LOW BLOOD SUGARS (Blood sugar LESS THAN 70 MG/DL) Please follow the RULE OF 15 for the treatment of hypoglycemia treatment (when your (blood sugars are less than 70 mg/dL)   STEP 1: Take 15 grams of carbohydrates when your blood sugar is low, which includes:  3-4 GLUCOSE TABS  OR 3-4 OZ OF JUICE OR REGULAR SODA OR ONE TUBE OF GLUCOSE GEL    STEP 2: RECHECK blood sugar in 15 MINUTES STEP 3: If your blood sugar is still low at the 15 minute recheck --> then, go back to STEP 1 and treat AGAIN with another 15 grams of carbohydrates.

## 2023-06-21 ENCOUNTER — Ambulatory Visit: Payer: Medicare Other | Admitting: Dermatology

## 2023-06-21 ENCOUNTER — Other Ambulatory Visit: Payer: Self-pay | Admitting: Neurosurgery

## 2023-06-21 DIAGNOSIS — D329 Benign neoplasm of meninges, unspecified: Secondary | ICD-10-CM

## 2023-06-29 ENCOUNTER — Other Ambulatory Visit: Payer: Self-pay | Admitting: Family Medicine

## 2023-06-29 DIAGNOSIS — I10 Essential (primary) hypertension: Secondary | ICD-10-CM

## 2023-07-30 ENCOUNTER — Ambulatory Visit
Admission: RE | Admit: 2023-07-30 | Discharge: 2023-07-30 | Disposition: A | Discharge status: Home / Self Care | Source: Ambulatory Visit | Attending: Neurosurgery | Admitting: Neurosurgery

## 2023-07-30 ENCOUNTER — Encounter: Payer: Self-pay | Admitting: Dermatology

## 2023-07-30 ENCOUNTER — Ambulatory Visit: Admitting: Dermatology

## 2023-07-30 VITALS — BP 125/77 | HR 74

## 2023-07-30 DIAGNOSIS — L720 Epidermal cyst: Secondary | ICD-10-CM | POA: Diagnosis not present

## 2023-07-30 DIAGNOSIS — L649 Androgenic alopecia, unspecified: Secondary | ICD-10-CM | POA: Diagnosis not present

## 2023-07-30 DIAGNOSIS — D329 Benign neoplasm of meninges, unspecified: Secondary | ICD-10-CM

## 2023-07-30 DIAGNOSIS — I70203 Unspecified atherosclerosis of native arteries of extremities, bilateral legs: Secondary | ICD-10-CM | POA: Insufficient documentation

## 2023-07-30 MED ORDER — GADOPICLENOL 0.5 MMOL/ML IV SOLN: 8.0000 mL | Freq: Once | INTRAVENOUS | Status: DC | PRN

## 2023-07-30 MED ORDER — SAFETY SEAL MISCELLANEOUS MISC: 1.0000 | Freq: Every morning | 11 refills | Status: DC

## 2023-07-30 NOTE — Patient Instructions (Addendum)
 Date: Mon Jul 30 2023  Hello Megan [name],  Thank you for visiting today. Here is a summary of the key instructions:  Skin Care: - Use 2%salicylic acid wash every other morning - Apply Avene retinol every other night with your Tretinoin  0.05% - Eventually, use salicylic acid wash every morning - Do not use salicylic acid and retinol together in the same application  Medications: - Apply minoxidil 8% with finasteride solution to scalp every morning   - Wash hands after applying the solution   - Avoid getting the solution on your face This prescription will be sent to [facility] who will be calling you  Supplements: - Take Viviscal supplement - Take Vital Proteins collagen supplement  Follow-up: - It will take 4 months to see a difference in hair growth - It will take 6 months to see a difference in milia  Please reach out if you have any questions or concerns.  Warm regards,  Dr. [facility]               Important Information   Due to recent changes in healthcare laws, you may see results of your pathology and/or laboratory studies on MyChart before the doctors have had a chance to review them. We understand that in some cases there may be results that are confusing or concerning to you. Please understand that not all results are received at the same time and often the doctors may need to interpret multiple results in order to provide you with the best plan of care or course of treatment. Therefore, we ask that you please give us  2 business days to thoroughly review all your results before contacting the office for clarification. Should we see a critical lab result, you will be contacted sooner.     If You Need Anything After Your Visit   If you have any questions or concerns for your doctor, please call our main line at [phone]. If no one answers, please leave a voicemail as directed and we will return your call as soon as possible. Messages left  after 4 pm will be answered the following business day.    You may also send us  a message via MyChart. We typically respond to MyChart messages within 1-2 business days.  For prescription refills, please ask your pharmacy to contact our office. Our fax number is [phone].  If you have an urgent issue when the clinic is closed that cannot wait until the next business day, you can page your doctor at the number below.     Please note that while we do our best to be available for urgent issues outside of office hours, we are not available 24/7.    If you have an urgent issue and are unable to reach us , you may choose to seek medical care at your doctor's office, retail clinic, urgent care center, or emergency room.   If you have a medical emergency, please immediately call 911 or go to the emergency department. In the event of inclement weather, please call our main line at [phone] for an update on the status of any delays or closures.  Dermatology Medication Tips: Please keep the boxes that topical medications come in in order to help keep track of the instructions about where and how to use these. Pharmacies typically print the medication instructions only on the boxes and not directly on the medication tubes.   If your medication is too expensive, please contact our office at [phone] or send  us  a message through MyChart.    We are unable to tell what your co-pay for medications will be in advance as this is different depending on your insurance coverage. However, we may be able to find a substitute medication at lower cost or fill out paperwork to get insurance to cover a needed medication.    If a prior authorization is required to get your medication covered by your insurance company, please allow us  1-2 business days to complete this process.   Drug prices often vary depending on where the prescription is filled and some pharmacies may offer cheaper prices.   The website  www.goodrx.com contains coupons for medications through different pharmacies. The prices here do not account for what the cost may be with help from insurance (it may be cheaper with your insurance), but the website can give you the price if you did not use any insurance.  - You can print the associated coupon and take it with your prescription to the pharmacy.  - You may also stop by our office during regular business hours and pick up a GoodRx coupon card.  - If you need your prescription sent electronically to a different pharmacy, notify our office through [facility] or by phone at [phone]

## 2023-07-30 NOTE — Progress Notes (Signed)
 Follow-Up Visit   Subjective  Megan [name] is a 68 y.o. female who presents for the following: Hair Thinning and Milia  Patient present today for follow up visit for Milia. Patient was last evaluated on 05/01/23. At this visit patient had biopsy that confirmed Milia & was prescribed Tretinoin  0.025% to apply 2 nights weekly for a couple of weeks then advised to increase to 3 nights weekly. Patient reports she is applying the Tretinoin  5 nights weekly. Patient reports sxs are unchanged. Patient states she is currently on a fixed income so she does not want to proceed with cosmetic removal. Patient denies medication changes.  Patient also has hair tinning located at the scalp that she would like to have examined. Patient reports the areas have been there for 8 months. She reports the areas are not bothersome. Patient reports she has tried otc hair vitamins. She states her previous hair care practices included: Locs for about 16 years, She did previous get relaxers and color. She reports she has been applying Batana Growth cream for about 1 months with no improvement.   The following portions of the chart were reviewed this encounter and updated as appropriate: medications, allergies, medical history  Review of Systems:  No other skin or systemic complaints except as noted in HPI or Assessment and Plan.  Objective  Well appearing patient in no apparent distress; mood and affect are within normal limits.  A focused examination was performed of the following areas: Scalp, Face and Posterior Ears  Relevant exam findings are noted in the Assessment and Plan.                                  Assessment & Plan   Milia - tiny firm white papules  - Assessment: Small white bumps on the face, including near the eye, consistent with milia. These are tiny cysts that have not been previously treated. Current skincare routine includes use of a light Ronal Murray moisturizer, without use  of heavy creams or Vaseline. - Plan:    Recommend manual extraction of milia     - Informed patient of cosmetic fee starting at $200 for up to 15 extractions (not covered by insurance)    Initiate topical treatments to prevent new milia formation:     - Salicylic acid wash every other morning, transitioning to daily use     - Retinol (Avene) every other night    Provided samples: LaRoche-Posay for salicylic acid wash, Avene for retinol    Educated patient on exfoliation benefits and proper use of products    Advised patient to expect results in approximately 6 months  Other Procedure(Milia Surgery): Quote for cosmetic removal: $200 to remove 15-20 on Face  ANDROGENETIC ALOPECIA (FEMALE PATTERN HAIR LOSS) Exam: Diffuse thinning of the crown and widening of the midline part with retention of the frontal hairline  Flared  Female Androgenic Alopecia is a chronic condition related to genetics and/or hormonal changes.  In women androgenetic alopecia is commonly associated with menopause but may occur any time after puberty.  It causes hair thinning primarily on the crown with widening of the part and temporal hairline recession.  Can use OTC Rogaine (minoxidil) 5% solution/foam as directed.   - Assessment: Gradual hair thinning at the temples and crown, consistent with androgenetic alopecia. No signs of inflammation or scarring noted. Patient's hairstyle (locks) may contribute to traction alopecia, exacerbating the appearance  of androgenetic alopecia. - Plan:    Prescribe topical solution of 8% minoxidil combined with finasteride     - To be compounded at a pharmacy     - Estimated cost: $45 (not covered by insurance)     - Apply in the morning, avoid contact with face, wash hands after application    Educate patient on medication mechanism: minoxidil increases blood flow, finasteride blocks hormone receptors    Recommend supplements:     - Viviscal     - Vital Proteins collagen    Advise  patient to expect results in approximately 4 months    Warn about potential increased shedding in the first month of treatment    If using another topical agent, instruct to use prescription in the morning and other agent at night    Provide pictures in after-visit summary for reference   Long term medication management.  Patient is using long term (months to years) prescription medication  to control their dermatologic condition.  These medications require periodic monitoring to evaluate for efficacy and side effects and may require periodic laboratory monitoring.   ANDROGENIC ALOPECIA   Related Medications Safety Seal Miscellaneous MISC Apply 1 Application topically in the morning. Medication Name: Hormonic Hair Solution (Minoxidil 8% and Finasteride 0.05%)  Return in about 4 months (around 11/30/2023) for Androgenetic Alopecia F/U.  I, Jetta Ager, am acting as Neurosurgeon for [employer], DO.  Documentation: I have reviewed the above documentation for accuracy and completeness, and I agree with the above.  Delon Lenis, DO

## 2023-08-07 ENCOUNTER — Other Ambulatory Visit (HOSPITAL_COMMUNITY): Payer: Self-pay | Admitting: Neurosurgery

## 2023-08-07 DIAGNOSIS — D329 Benign neoplasm of meninges, unspecified: Secondary | ICD-10-CM

## 2023-08-08 LAB — HM DIABETES EYE EXAM

## 2023-08-13 ENCOUNTER — Other Ambulatory Visit: Payer: Self-pay | Admitting: Family Medicine

## 2023-08-13 DIAGNOSIS — I10 Essential (primary) hypertension: Secondary | ICD-10-CM

## 2023-08-20 ENCOUNTER — Encounter: Payer: Self-pay | Admitting: Family Medicine

## 2023-08-20 ENCOUNTER — Ambulatory Visit: Admitting: Family Medicine

## 2023-08-20 VITALS — BP 102/60 | HR 85 | Temp 98.6°F | Ht 64.0 in | Wt 182.9 lb

## 2023-08-20 DIAGNOSIS — J301 Allergic rhinitis due to pollen: Secondary | ICD-10-CM | POA: Diagnosis not present

## 2023-08-20 DIAGNOSIS — E1165 Type 2 diabetes mellitus with hyperglycemia: Secondary | ICD-10-CM | POA: Diagnosis not present

## 2023-08-20 DIAGNOSIS — Z0000 Encounter for general adult medical examination without abnormal findings: Secondary | ICD-10-CM | POA: Diagnosis not present

## 2023-08-20 DIAGNOSIS — R052 Subacute cough: Secondary | ICD-10-CM | POA: Diagnosis not present

## 2023-08-20 DIAGNOSIS — E785 Hyperlipidemia, unspecified: Secondary | ICD-10-CM | POA: Diagnosis not present

## 2023-08-20 LAB — COMPREHENSIVE METABOLIC PANEL WITH GFR
ALT: 29 U/L (ref 0–35)
AST: 24 U/L (ref 0–37)
Albumin: 4.3 g/dL (ref 3.5–5.2)
Alkaline Phosphatase: 61 U/L (ref 39–117)
BUN: 15 mg/dL (ref 6–23)
CO2: 27 meq/L (ref 19–32)
Calcium: 9.4 mg/dL (ref 8.4–10.5)
Chloride: 104 meq/L (ref 96–112)
Creatinine, Ser: 0.8 mg/dL (ref 0.40–1.20)
GFR: 74.65 mL/min (ref >60.00)
Glucose, Bld: 135 mg/dL — ABNORMAL HIGH (ref 70–99)
Potassium: 3.9 meq/L (ref 3.5–5.1)
Sodium: 139 meq/L (ref 135–145)
Total Bilirubin: 0.7 mg/dL (ref 0.2–1.2)
Total Protein: 7.9 g/dL (ref 6.0–8.3)

## 2023-08-20 LAB — LIPID PANEL
Cholesterol: 164 mg/dL (ref 0–200)
HDL: 50.9 mg/dL (ref >39.00)
LDL Cholesterol: 79 mg/dL (ref 0–99)
NonHDL: 112.77
Total CHOL/HDL Ratio: 3
Triglycerides: 169 mg/dL — ABNORMAL HIGH (ref 0.0–149.0)
VLDL: 33.8 mg/dL (ref 0.0–40.0)

## 2023-08-20 NOTE — Patient Instructions (Signed)
 Your Tetanus vaccine, your shingles vaccines are due, flu shots are coming up soon.

## 2023-08-20 NOTE — Progress Notes (Unsigned)
 Complete physical exam  Patient: Megan [name]   DOB: [birth year]   68 y.o. Female  MRN: [id number]  Subjective:    Chief Complaint  Patient presents with  . Medicare Wellness    Megan [name] is a 68 y.o. female who presents today for a complete physical exam. She reports consuming a general diet. Home exercise routine includes walking 2-3 hrs per week. She generally feels well. She reports sleeping ok, states that she does wake up at night, sometimes to urinate, but for the most part she feels relatively rested. She does not have additional problems to discuss today.   Takes a vitamin D  supplement daily. No other herbal supplements. Patient used to be on hair skin and nails supplements but it gave her heartburn.   Most recent fall risk assessment:    08/20/2023   10:32 AM  Fall Risk   Falls in the past year? 1  Number falls in past yr: 0  Injury with Fall? 0  Risk for fall due to : Other (Comment)  Risk for fall due to: Comment got up quickly last night after hearing something over night  Follow up Falls evaluation completed     Most recent depression screenings:    08/20/2023   10:33 AM 12/26/2022    2:16 PM  PHQ 2/9 Scores  PHQ - 2 Score 1 0  PHQ- 9 Score 4 7    Vision:Within last year and Dental: No current dental problems and No regular dental care   Patient Active Problem List   Diagnosis Date Noted  . Atherosclerosis of artery of both lower extremities (HCC) 07/30/2023  . Atypical chest pain 01/19/2022  . DDD (degenerative disc disease), lumbar 08/10/2021  . Right knee pain 06/17/2021  . Low back pain with radiation 12/21/2020  . Preventative health care 12/21/2020  . At risk for cardiovascular event 11/23/2020  . Type 2 diabetes mellitus without complication, without long-term current use of insulin (HCC) 11/23/2020  . Multinodular goiter 11/23/2020  . Generalized abdominal pain 02/19/2020  . History of hernia repair 02/19/2020  . Hair loss  02/17/2020  . Controlled diabetes mellitus type 2 with complications (HCC) 02/17/2020  . Diabetes mellitus without complication (HCC) 11/11/2019  . Degenerative lumbar spinal stenosis 09/04/2019  . Degenerative spondylolisthesis 09/04/2019  . Neck pain 09/04/2019  . Type 2 diabetes mellitus with hyperglycemia, without long-term current use of insulin (HCC) 09/02/2019  . Dyslipidemia 09/02/2019  . Uncontrolled type 2 diabetes mellitus with hyperglycemia (HCC) 11/05/2018  . Fatigue 11/05/2018  . Acute non-recurrent pansinusitis 04/25/2018  . Bronchitis 05/21/2017  . CAP (community acquired pneumonia) 10/15/2013  . Wheezing 09/29/2013  . Pain, knee 10/02/2012  . Gastrocnemius muscle strain 10/02/2012  . Wrist sprain 10/02/2012  . Thyroid  nodule 05/03/2012  . Rash and nonspecific skin eruption 03/14/2012  . Alopecia 03/28/2010  . History of colonic polyps 11/09/2009  . HEMANGIOMA, HEPATIC 11/04/2009  . UNSPECIFIED ANEMIA 11/04/2009  . DIVERTICULOSIS, COLON 11/04/2009  . FATTY LIVER DISEASE 11/04/2009  . PALPITATIONS 09/06/2009  . Chronic fatigue 08/07/2008  . Allergic rhinitis 04/10/2007  . Diabetes mellitus type II, controlled (HCC) 12/21/2006  . Hyperlipidemia 04/12/2006  . HTN (hypertension) 04/12/2006      Patient Care Team: [name], MD as PCP - General (Family Medicine) Lonni Slain, MD as PCP - Cardiology (Cardiology) Johnnye Ade, MD as Consulting Physician (Obstetrics and Gynecology) [facility], [facility]   Outpatient Medications Prior to Visit  Medication Sig  .  amLODipine  (NORVASC ) 10 MG tablet TAKE 1 TABLET(10 MG) BY MOUTH DAILY  . azithromycin  (ZITHROMAX  Z-PAK) 250 MG tablet As directed  . b complex vitamins tablet Take 1 tablet by mouth daily.  . Cholecalciferol (VITAMIN D3) 250 MCG (10000 UT) TABS Take by mouth daily.  . diazepam  (VALIUM ) 5 MG tablet Take by mouth.  SABRA Fish Oil-Cholecalciferol (FISH OIL + D3 PO) Take 1,200 mg  by mouth daily.   . fluticasone  (FLONASE ) 50 MCG/ACT nasal spray SHAKE LIQUID AND USE 2 SPRAYS IN EACH NOSTRIL DAILY  . glimepiride  (AMARYL ) 1 MG tablet Take 1 tablet (1 mg total) by mouth daily with breakfast.  . losartan  (COZAAR ) 100 MG tablet TAKE 1 TABLET BY MOUTH EVERY DAY  . Multiple Vitamin (MULTIVITAMIN) tablet Take 1 tablet by mouth daily. OVER 50 VITAMIN  . Safety Seal Miscellaneous MISC Apply 1 Application topically in the morning. Medication Name: Hormonic Hair Solution (Minoxidil 8% and Finasteride 0.05%)  . sitaGLIPtin -metformin  (JANUMET ) 50-1000 MG tablet Take 1 tablet by mouth daily at 6 (six) AM.  . tretinoin  (RETIN-A ) 0.025 % cream Apply topically at bedtime. Apply pea size amount to affected areas of face and neck at bedtime 2 nights per week x 2 weeks then increase to 3 nights per week if tolerated.  . [DISCONTINUED] HYDROcodone  bit-homatropine (HYCODAN) 5-1.5 MG/5ML syrup Take 5 mLs by mouth every 4 (four) hours as needed.   No facility-administered medications prior to visit.    Review of Systems  HENT:  Negative for hearing loss.   Eyes:  Negative for blurred vision.  Respiratory:  Negative for shortness of breath.   Cardiovascular:  Negative for chest pain.  Gastrointestinal: Negative.   Genitourinary: Negative.   Musculoskeletal:  Negative for back pain.  Neurological:  Negative for headaches.  Psychiatric/Behavioral:  Negative for depression.        Objective:     BP 102/60   Pulse 85   Temp 98.6 F (37 C) (Oral)   Ht 5' 4 (1.626 m)   Wt 182 lb 14.4 oz (83 kg)   SpO2 98%   BMI 31.39 kg/m  {Vitals History (Optional):23777}  Physical Exam Vitals reviewed.  Constitutional:      Appearance: Normal appearance. She is well-groomed and normal weight.  HENT:     Right Ear: Tympanic membrane and ear canal normal.     Left Ear: Tympanic membrane and ear canal normal.     Mouth/Throat:     Mouth: Mucous membranes are moist.     Pharynx: No posterior  oropharyngeal erythema.  Eyes:     Conjunctiva/sclera: Conjunctivae normal.  Neck:     Thyroid : No thyromegaly.  Cardiovascular:     Rate and Rhythm: Normal rate and regular rhythm.     Pulses: Normal pulses.     Heart sounds: S1 normal and S2 normal.  Pulmonary:     Effort: Pulmonary effort is normal.     Breath sounds: Normal breath sounds and air entry.  Abdominal:     General: Abdomen is flat. Bowel sounds are normal.     Palpations: Abdomen is soft.  Musculoskeletal:     Right lower leg: No edema.     Left lower leg: No edema.  Lymphadenopathy:     Cervical: No cervical adenopathy.  Neurological:     Mental Status: She is alert and oriented to person, place, and time. Mental status is at baseline.     Gait: Gait is intact.  Psychiatric:  Mood and Affect: Mood and affect normal.        Speech: Speech normal.        Behavior: Behavior normal.        Judgment: Judgment normal.      No results found for any visits on 08/20/23. {Show previous labs (optional):23779}    Assessment & Plan:    Routine Health Maintenance and Physical Exam  Immunization History  Administered Date(s) Administered  . Fluad Quad(high Dose 65+) 10/29/2020  . Influenza Whole 11/06/2011  . Influenza-Unspecified 09/16/2021, 10/17/2022  . PFIZER(Purple Top)SARS-COV-2 Vaccination 03/27/2019, 04/16/2019, 11/16/2019  . Pneumococcal Conjugate-13 01/03/2019  . Pneumococcal Polysaccharide-23 07/01/2013, 12/23/2021  . Tdap 05/03/2012  . Zoster, Live 07/01/2013    Health Maintenance  Topic Date Due  . Hepatitis B Vaccines (1 of 3 - Risk 3-dose series) Never done  . DTaP/Tdap/Td (2 - Td or Tdap) 05/04/2022  . OPHTHALMOLOGY EXAM  06/25/2022  . COVID-19 Vaccine (4 - 2024-25 season) 09/17/2022  . INFLUENZA VACCINE  08/17/2023  . Medicare Annual Wellness (AWV)  09/21/2023  . Zoster Vaccines- Shingrix  (1 of 2) 11/20/2023 (Originally 03/19/1974)  . MAMMOGRAM  09/20/2023  . HEMOGLOBIN A1C   11/28/2023  . Diabetic kidney evaluation - eGFR measurement  03/22/2024  . Diabetic kidney evaluation - Urine ACR  03/22/2024  . FOOT EXAM  05/27/2024  . Colonoscopy  10/26/2026  . Pneumococcal Vaccine: 50+ Years  Completed  . DEXA SCAN  Completed  . Hepatitis C Screening  Completed  . HPV VACCINES  Aged Out  . Meningococcal B Vaccine  Aged Out    Discussed health benefits of physical activity, and encouraged her to engage in regular exercise appropriate for her age and condition.  Preventative health care  Dyslipidemia -     Lipid panel; Future  Type 2 diabetes mellitus with hyperglycemia, without long-term current use of insulin (HCC) -     Comprehensive metabolic panel with GFR; Future  Subacute cough -     Ambulatory referral to Allergy  Seasonal allergic rhinitis due to pollen -     Ambulatory referral to Allergy    Return in about 6 months (around 02/20/2024).     Heron CHRISTELLA Sharper, MD

## 2023-08-24 ENCOUNTER — Ambulatory Visit: Payer: Self-pay | Admitting: Family Medicine

## 2023-09-09 NOTE — Progress Notes (Unsigned)
 New Patient Note  RE: Megan [name] MRN: [id number] DOB: 04-24-1955 Date of Office Visit: 09/10/2023  Consult requested by: [name], MD Primary care provider: [name], MD  Chief Complaint: No chief complaint on file.  History of Present Illness: I had the pleasure of seeing [name] for initial evaluation at the [facility] on 09/10/2023. She is a 68 y.o. female, who is referred here by [name], MD for the evaluation of cough and allergic rhinitis.  Discussed the use of AI scribe software for clinical note transcription with the patient, who gave verbal consent to proceed.  History of Present Illness             ***  Assessment and Plan: Megan [name] is a 68 y.o. female with: ***  Assessment and Plan               No follow-ups on file.  No orders of the defined types were placed in this encounter.  Lab Orders  No laboratory test(s) ordered today    Other allergy screening: Asthma: {Blank single:19197::yes,no} Rhino conjunctivitis: {Blank single:19197::yes,no} Food allergy: {Blank single:19197::yes,no} Medication allergy: {Blank single:19197::yes,no} Hymenoptera allergy: {Blank single:19197::yes,no} Urticaria: {Blank single:19197::yes,no} Eczema:{Blank single:19197::yes,no} History of recurrent infections suggestive of immunodeficency: {Blank single:19197::yes,no}  Diagnostics: Spirometry:  Tracings reviewed. Her effort: {Blank single:19197::Good reproducible efforts.,It was hard to get consistent efforts and there is a question as to whether this reflects a maximal maneuver.,Poor effort, data can not be interpreted.} FVC: ***L FEV1: ***L, ***% predicted FEV1/FVC ratio: ***% Interpretation: {Blank single:19197::Spirometry consistent with mild obstructive disease,Spirometry consistent with moderate obstructive disease,Spirometry consistent with severe  obstructive disease,Spirometry consistent with possible restrictive disease,Spirometry consistent with mixed obstructive and restrictive disease,Spirometry uninterpretable due to technique,Spirometry consistent with normal pattern,No overt abnormalities noted given today's efforts}.  Please see scanned spirometry results for details.  Skin Testing: {Blank single:19197::Select foods,Environmental allergy panel,Environmental allergy panel and select foods,Food allergy panel,None,Deferred due to recent antihistamines use}. *** Results discussed with patient/family.   Past Medical History: Patient Active Problem List   Diagnosis Date Noted  . Atherosclerosis of artery of both lower extremities (HCC) 07/30/2023  . Atypical chest pain 01/19/2022  . DDD (degenerative disc disease), lumbar 08/10/2021  . Right knee pain 06/17/2021  . Low back pain with radiation 12/21/2020  . Preventative health care 12/21/2020  . At risk for cardiovascular event 11/23/2020  . Type 2 diabetes mellitus without complication, without long-term current use of insulin (HCC) 11/23/2020  . Multinodular goiter 11/23/2020  . Generalized abdominal pain 02/19/2020  . History of hernia repair 02/19/2020  . Hair loss 02/17/2020  . Controlled diabetes mellitus type 2 with complications (HCC) 02/17/2020  . Diabetes mellitus without complication (HCC) 11/11/2019  . Degenerative lumbar spinal stenosis 09/04/2019  . Degenerative spondylolisthesis 09/04/2019  . Neck pain 09/04/2019  . Type 2 diabetes mellitus with hyperglycemia, without long-term current use of insulin (HCC) 09/02/2019  . Dyslipidemia 09/02/2019  . Uncontrolled type 2 diabetes mellitus with hyperglycemia (HCC) 11/05/2018  . Fatigue 11/05/2018  . Acute non-recurrent pansinusitis 04/25/2018  . Bronchitis 05/21/2017  . CAP (community acquired pneumonia) 10/15/2013  . Wheezing 09/29/2013  . Pain, knee 10/02/2012  . Gastrocnemius muscle  strain 10/02/2012  . Wrist sprain 10/02/2012  . Thyroid  nodule 05/03/2012  . Rash and nonspecific skin eruption 03/14/2012  . Alopecia 03/28/2010  . History of colonic polyps 11/09/2009  . HEMANGIOMA, HEPATIC 11/04/2009  . UNSPECIFIED ANEMIA 11/04/2009  . DIVERTICULOSIS, COLON  11/04/2009  . FATTY LIVER DISEASE 11/04/2009  . PALPITATIONS 09/06/2009  . Chronic fatigue 08/07/2008  . Allergic rhinitis 04/10/2007  . Diabetes mellitus type II, controlled (HCC) 12/21/2006  . Hyperlipidemia 04/12/2006  . HTN (hypertension) 04/12/2006   Past Medical History:  Diagnosis Date  . Allergy   . Arthritis    fingers  . Colon polyp   . Diabetes mellitus   . Diverticulosis   . GERD (gastroesophageal reflux disease)    occasionally  . H/O hiatal hernia    ?  SABRA Hemorrhoids   . Hyperlipidemia    takes fish oil  . Hypertension   . Infectious colitis   . Ventral hernia    Past Surgical History: Past Surgical History:  Procedure Laterality Date  . colonscopy    . DIAGNOSTIC LAPAROSCOPY    . FOOT SURGERY Bilateral    hammer toes  . HEMORRHOID SURGERY    . HERNIA REPAIR  01/20/2011   incisional  . INCISIONAL HERNIA REPAIR  01/20/2011   Procedure: LAPAROSCOPIC INCISIONAL HERNIA;  Surgeon: Redell Alm Faith, DO;  Location: [facility];  Service: General;  Laterality: N/A;  Laparoscopic repair of a recurrent incisional hernia with Mesh  . INGUINAL HERNIA REPAIR     x's 3  . VENTRAL HERNIA REPAIR     Medication List:  Current Outpatient Medications  Medication Sig Dispense Refill  . amLODipine  (NORVASC ) 10 MG tablet TAKE 1 TABLET(10 MG) BY MOUTH DAILY 90 tablet 0  . azithromycin  (ZITHROMAX  Z-PAK) 250 MG tablet As directed 6 each 0  . b complex vitamins tablet Take 1 tablet by mouth daily.    . Cholecalciferol (VITAMIN D3) 250 MCG (10000 UT) TABS Take by mouth daily.    . diazepam  (VALIUM ) 5 MG tablet Take by mouth.    SABRA Fish Oil-Cholecalciferol (FISH OIL + D3 PO) Take 1,200 mg by mouth daily.      . fluticasone  (FLONASE ) 50 MCG/ACT nasal spray SHAKE LIQUID AND USE 2 SPRAYS IN EACH NOSTRIL DAILY 16 g 5  . glimepiride  (AMARYL ) 1 MG tablet Take 1 tablet (1 mg total) by mouth daily with breakfast. 90 tablet 3  . losartan  (COZAAR ) 100 MG tablet TAKE 1 TABLET BY MOUTH EVERY DAY 90 tablet 1  . Multiple Vitamin (MULTIVITAMIN) tablet Take 1 tablet by mouth daily. OVER 50 VITAMIN    . Safety Seal Miscellaneous MISC Apply 1 Application topically in the morning. Medication Name: Hormonic Hair Solution (Minoxidil 8% and Finasteride 0.05%) 30 mL 11  . sitaGLIPtin -metformin  (JANUMET ) 50-1000 MG tablet Take 1 tablet by mouth daily at 6 (six) AM. 90 tablet 3  . tretinoin  (RETIN-A ) 0.025 % cream Apply topically at bedtime. Apply pea size amount to affected areas of face and neck at bedtime 2 nights per week x 2 weeks then increase to 3 nights per week if tolerated. 45 g 3   No current facility-administered medications for this visit.   Allergies: Allergies  Allergen Reactions  . Aspirin Nausea Only   Social History: Social History   Socioeconomic History  . Marital status: Divorced    Spouse name: Not on file  . Number of children: 1  . Years of education: Not on file  . Highest education level: Not on file  Occupational History  . Occupation: assessment specialist-retired    Comment: city [employer] [place]  Tobacco Use  . Smoking status: Former    Current packs/day: 0.00    Average packs/day: 1 pack/day for 20.0 years (20.0 ttl pk-yrs)  Types: Cigarettes    Start date: 01/05/1976    Quit date: 01/05/1996    Years since quitting: 27.6  . Smokeless tobacco: Never  Vaping Use  . Vaping status: Never Used  Substance and Sexual Activity  . Alcohol use: Yes    Alcohol/week: 4.0 standard drinks of alcohol    Types: 4 Glasses of wine per week    Comment: occasional wine  . Drug use: No  . Sexual activity: Not Currently  Other Topics Concern  . Not on file  Social History Narrative    Exercise 3-4  times a week   Social Drivers of Health   Financial Resource Strain: Low Risk  (08/20/2023)   Overall Financial Resource Strain (CARDIA)   . Difficulty of Paying Living Expenses: Not hard at all  Food Insecurity: No Food Insecurity (08/20/2023)   Hunger Vital Sign   . Worried About Programme researcher, broadcasting/film/video in the Last Year: Never true   . Ran Out of Food in the Last Year: Never true  Transportation Needs: No Transportation Needs (08/20/2023)   PRAPARE - Transportation   . Lack of Transportation (Medical): No   . Lack of Transportation (Non-Medical): No  Physical Activity: Sufficiently Active (08/20/2023)   Exercise Vital Sign   . Days of Exercise per Week: 7 days   . Minutes of Exercise per Session: 30 min  Stress: Stress Concern Present (08/20/2023)   [employer] of Occupational Health - Occupational Stress Questionnaire   . Feeling of Stress: To some extent  Social Connections: Moderately Isolated (08/20/2023)   Social Connection and Isolation Panel   . Frequency of Communication with Friends and Family: More than three times a week   . Frequency of Social Gatherings with Friends and Family: More than three times a week   . Attends Religious Services: More than 4 times per year   . Active Member of Clubs or Organizations: No   . Attends Banker Meetings: Never   . Marital Status: Divorced   Lives in a ***. Smoking: *** Occupation: ***  Environmental HistorySurveyor, minerals in the house: Network engineer in the family room: {Blank single:19197::yes,no} Carpet in the bedroom: {Blank single:19197::yes,no} Heating: {Blank single:19197::electric,gas,heat pump} Cooling: {Blank single:19197::central,window,heat pump} Pet: {Blank single:19197::yes ***,no}  Family History: Family History  Problem Relation Age of Onset  . Breast cancer Mother   . Diabetes Mother   . Dementia Mother   . Liver cancer Mother    . Heart disease Father        heart attack  . Diabetes Brother   . Breast cancer Sister   . Colon cancer Neg Hx   . Stomach cancer Neg Hx    Problem                               Relation Asthma                                   *** Eczema                                *** Food allergy                          *** Allergic rhino conjunctivitis     ***  Review of Systems  Constitutional:  Negative for appetite change, chills, fever and unexpected weight change.  HENT:  Negative for congestion and rhinorrhea.   Eyes:  Negative for itching.  Respiratory:  Negative for cough, chest tightness, shortness of breath and wheezing.   Cardiovascular:  Negative for chest pain.  Gastrointestinal:  Negative for abdominal pain.  Genitourinary:  Negative for difficulty urinating.  Skin:  Negative for rash.  Neurological:  Negative for headaches.    Objective: There were no vitals taken for this visit. There is no height or weight on file to calculate BMI. Physical Exam Vitals and nursing note reviewed.  Constitutional:      Appearance: Normal appearance. She is well-developed.  HENT:     Head: Normocephalic and atraumatic.     Right Ear: Tympanic membrane and external ear normal.     Left Ear: Tympanic membrane and external ear normal.     Nose: Nose normal.     Mouth/Throat:     Mouth: Mucous membranes are moist.     Pharynx: Oropharynx is clear.  Eyes:     Conjunctiva/sclera: Conjunctivae normal.  Cardiovascular:     Rate and Rhythm: Normal rate and regular rhythm.     Heart sounds: Normal heart sounds. No murmur heard.    No friction rub. No gallop.  Pulmonary:     Effort: Pulmonary effort is normal.     Breath sounds: Normal breath sounds. No wheezing, rhonchi or rales.  Musculoskeletal:     Cervical back: Neck supple.  Skin:    General: Skin is warm.     Findings: No rash.  Neurological:     Mental Status: She is alert and oriented to person, place, and time.   Psychiatric:        Behavior: Behavior normal.   The plan was reviewed with the patient/family, and all questions/concerned were addressed.  It was my pleasure to see Megan [name] today and participate in her care. Please feel free to contact me with any questions or concerns.  Sincerely,  Orlan Cramp, DO Allergy & Immunology  [facility]  [facility] office: [phone] [facility] office: [phone]

## 2023-09-10 ENCOUNTER — Ambulatory Visit (INDEPENDENT_AMBULATORY_CARE_PROVIDER_SITE_OTHER): Admitting: Allergy

## 2023-09-10 ENCOUNTER — Telehealth: Payer: Self-pay | Admitting: Allergy

## 2023-09-10 ENCOUNTER — Encounter: Payer: Self-pay | Admitting: Allergy

## 2023-09-10 ENCOUNTER — Other Ambulatory Visit: Payer: Self-pay

## 2023-09-10 VITALS — BP 110/62 | HR 89 | Temp 98.6°F | Ht 64.5 in | Wt 184.6 lb

## 2023-09-10 DIAGNOSIS — R12 Heartburn: Secondary | ICD-10-CM

## 2023-09-10 DIAGNOSIS — R053 Chronic cough: Secondary | ICD-10-CM

## 2023-09-10 DIAGNOSIS — Z87891 Personal history of nicotine dependence: Secondary | ICD-10-CM | POA: Diagnosis not present

## 2023-09-10 MED ORDER — OMEPRAZOLE 40 MG PO CPDR: 40.0000 mg | DELAYED_RELEASE_CAPSULE | Freq: Every day | ORAL | 2 refills | Status: DC

## 2023-09-10 NOTE — Patient Instructions (Addendum)
 Chronic coughing The most common causes of chronic cough include the following: upper airway cough syndrome (UACS) which is caused by variety of rhinitis conditions; asthma; gastroesophageal reflux disease (GERD); chronic bronchitis from cigarette smoking or other inhaled environmental irritants; non-asthmatic eosinophilic bronchitis; and bronchiectasis.  In prospective studies, these conditions have accounted for up to 94% of the causes of chronic cough in immunocompetent adults.  Refer to ENT.   Rhinitis  Return for allergy skin testing. Will make additional recommendations based on results. Make sure you don't take any antihistamines for 3 days before the skin testing appointment. Don't put any lotion on the back and arms on the day of testing.  Must be in good health and not ill. No vaccines/injections/antibiotics within the past 7 days.  Plan on being here for 30-60 minutes.  Possible reflux See handout for lifestyle and dietary modifications. Start omeprazole  40mg  once day - nothing to eat or drink for 20-30 minutes afterwards.   Follow up for skin testing.

## 2023-09-10 NOTE — Telephone Encounter (Signed)
 Please place referral to ENT for chronic cough.  Thank you.

## 2023-09-21 ENCOUNTER — Ambulatory Visit: Payer: Self-pay

## 2023-09-21 NOTE — Telephone Encounter (Signed)
 Noted- ok to close.

## 2023-09-21 NOTE — Telephone Encounter (Signed)
 FYI Only or Action Required?: FYI only for provider.  Patient was last seen in primary care on 08/20/2023 by [name], MD.  Called Nurse Triage reporting Cough.  Symptoms began several days ago.  Interventions attempted: OTC medications: Cough syrup, Mucinex , Vicks, and Tylenol .  Symptoms are: gradually worsening.  Triage Disposition: See PCP When Office is Open (Within 3 Days)  Patient/caregiver understands and will follow disposition?: Yes     Copied from CRM #[id number]. Topic: Clinical - Red Word Triage >> Sep 21, 2023  1:03 PM Megan [name] wrote: Megan [name] that prompted transfer to Nurse Triage: Patient called in stated she has been having increased coughing , sweating , not feeling good . Was no available appointments for today besides a clinic she doesn't want to go to , wanted some advice on what she should do     ----------------------------------------------------------------------- From previous Reason for Contact - Scheduling: Patient/patient representative is calling to schedule an appointment. Refer to attachments for appointment information. Reason for Disposition  [1] Also has allergy symptoms (e.g., itchy eyes, clear nasal discharge, postnasal drip) AND [2] they are acting up  Answer Assessment - Initial Assessment Questions Pt states she has had sinus issues in the past but symptoms feels different. Using otc cough syrup, mucinex , vicks, and Tylenol  for symptoms. Patient states her great nephew tested positive for RSV this week. Pt states she would like testing done. Patient states she may go to nearest [facility] today for symptoms after an appointment was made in office for Monday.   1. ONSET: When did the cough begin?      Tuesday  2. SEVERITY: How bad is the cough today?      Severe  3. SPUTUM: Describe the color of your sputum (e.g., none, dry cough; clear, white, yellow, green)     Denies sputum  4. HEMOPTYSIS: Are you coughing up any blood? If Yes, ask:  How much? (e.g., flecks, streaks, tablespoons, etc.)     Denies 5. DIFFICULTY BREATHING: Are you having difficulty breathing? If Yes, ask: How bad is it? (e.g., mild, moderate, severe)      Denies  6. FEVER: Do you have a fever? If Yes, ask: What is your temperature, how was it measured, and when did it start?     Yes, temperature at 100  7. CARDIAC HISTORY: Do you have any history of heart disease? (e.g., heart attack, congestive heart failure)      Denies  8. LUNG HISTORY: Do you have any history of lung disease?  (e.g., pulmonary embolus, asthma, emphysema)     Denies  9. OTHER SYMPTOMS: Do you have any other symptoms? (e.g., runny nose, wheezing, chest pain)       Nasal congestion, and chills  Protocols used: Cough - Acute Non-Productive-A-AH

## 2023-09-24 ENCOUNTER — Ambulatory Visit (INDEPENDENT_AMBULATORY_CARE_PROVIDER_SITE_OTHER): Admitting: Family Medicine

## 2023-09-24 ENCOUNTER — Encounter: Payer: Self-pay | Admitting: Family Medicine

## 2023-09-24 ENCOUNTER — Ambulatory Visit: Admitting: Allergy

## 2023-09-24 VITALS — BP 144/70 | HR 76 | Temp 98.1°F | Wt 185.5 lb

## 2023-09-24 DIAGNOSIS — J209 Acute bronchitis, unspecified: Secondary | ICD-10-CM | POA: Diagnosis not present

## 2023-09-24 NOTE — Progress Notes (Signed)
 Established Patient Office Visit  Subjective   Patient ID: Megan [name], female    DOB: [birth year]  Age: 68 y.o. MRN: [id number]  Chief Complaint  Patient presents with   Cough   Nasal Congestion    HPI   Megan [name] is seen with cough and recent fever.  She states last week she developed some nasal congestion and a cough and had fever up to 101 last Tuesday and Wednesday but no fever since then.  She has had occasional sweats.  Denies any sore throat.  Cough occasionally productive of yellow sputum.  Has been taking over-the-counter Mucinex  600 mg twice daily.  Does feel better overall.  She was recently around 27-month-old great nephew who had RSV diagnosis.  Megan [name] denies any wheezing.  No dyspnea.  No nausea or vomiting.  No chronic lung disease.  She does have hypertension and type 2 diabetes  Past Medical History:  Diagnosis Date   Allergy    Arthritis    fingers   Colon polyp    Diabetes mellitus    Diverticulosis    GERD (gastroesophageal reflux disease)    occasionally   H/O hiatal hernia    ?   Hemorrhoids    Hyperlipidemia    takes fish oil   Hypertension    Infectious colitis    Recurrent upper respiratory infection (URI)    Ventral hernia    Past Surgical History:  Procedure Laterality Date   colonscopy     DIAGNOSTIC LAPAROSCOPY     FOOT SURGERY Bilateral    hammer toes   HEMORRHOID SURGERY     HERNIA REPAIR  01/20/2011   incisional   INCISIONAL HERNIA REPAIR  01/20/2011   Procedure: LAPAROSCOPIC INCISIONAL HERNIA;  Surgeon: Redell Alm Faith, DO;  Location: [facility];  Service: General;  Laterality: N/A;  Laparoscopic repair of a recurrent incisional hernia with Mesh   INGUINAL HERNIA REPAIR     x's 3   VENTRAL HERNIA REPAIR      reports that she quit smoking about 27 years ago. Her smoking use included cigarettes. She started smoking about 47 years ago. She has a 20 pack-year smoking history. She has never used smokeless tobacco. She  reports current alcohol use of about 4.0 standard drinks of alcohol per week. She reports that she does not use drugs. family history includes Breast cancer in her mother and sister; Dementia in her mother; Diabetes in her brother and mother; Heart disease in her father; Liver cancer in her mother. Allergies  Allergen Reactions   Aspirin Nausea Only    Review of Systems  Constitutional:  Negative for chills and fever.  Respiratory:  Positive for cough. Negative for hemoptysis, sputum production, shortness of breath and wheezing.   Cardiovascular:  Negative for chest pain.      Objective:     BP (!) 144/70   Pulse 76   Temp 98.1 F (36.7 C) (Oral)   Wt 185 lb 8 oz (84.1 kg)   SpO2 97%   BMI 31.35 kg/m  BP Readings from Last 3 Encounters:  09/24/23 (!) 144/70  09/10/23 110/62  08/20/23 102/60   Wt Readings from Last 3 Encounters:  09/24/23 185 lb 8 oz (84.1 kg)  09/10/23 184 lb 9.6 oz (83.7 kg)  08/20/23 182 lb 14.4 oz (83 kg)      Physical Exam Vitals reviewed.  Constitutional:      General: She is not in acute distress.    Appearance:  She is not ill-appearing.  HENT:     Right Ear: Tympanic membrane normal.     Left Ear: Tympanic membrane normal.  Cardiovascular:     Rate and Rhythm: Normal rate and regular rhythm.  Pulmonary:     Effort: Pulmonary effort is normal.     Breath sounds: Normal breath sounds. No wheezing or rales.  Neurological:     Mental Status: She is alert.      No results found for any visits on 09/24/23.    The 10-year ASCVD risk score (Arnett DK, et al., 2019) is: 27.2%    Assessment & Plan:   Acute upper respiratory illness.  Suspect acute bronchitis.  Patient did report fever middle of last week but none since then.  She has normal lung exam.  Normal O2 sats room air.  Feels better overall.  - Increase Mucinex  to 1200 mg twice daily -Follow-up immediately for any recurrent fever or new symptoms such as shortness of breath -No  clear indication for antibiotics at this time -We decided not to do any COVID testing since she has past 5-day window for treatment with antivirals Wolm Scarlet, MD

## 2023-09-24 NOTE — Patient Instructions (Signed)
 Make sure taking Mucinex  1,200 mg every 12 hours and stay well hydrated  Follow up for any fever or increased shortness of breath.

## 2023-09-27 ENCOUNTER — Other Ambulatory Visit: Payer: Self-pay | Admitting: Family Medicine

## 2023-09-27 DIAGNOSIS — I10 Essential (primary) hypertension: Secondary | ICD-10-CM

## 2023-10-15 DIAGNOSIS — Z1231 Encounter for screening mammogram for malignant neoplasm of breast: Secondary | ICD-10-CM | POA: Diagnosis not present

## 2023-10-15 LAB — HM MAMMOGRAPHY

## 2023-10-25 ENCOUNTER — Encounter: Payer: Self-pay | Admitting: Family Medicine

## 2023-10-25 ENCOUNTER — Ambulatory Visit: Admitting: Family Medicine

## 2023-10-25 VITALS — Temp 96.1°F

## 2023-10-25 DIAGNOSIS — Z0000 Encounter for general adult medical examination without abnormal findings: Secondary | ICD-10-CM | POA: Diagnosis not present

## 2023-10-25 NOTE — Progress Notes (Signed)
 Subjective:   Megan [name] is a 68 y.o. female who presents for Medicare Annual (Subsequent) preventive examination.  Visit Complete: Virtual I connected with  Megan [name] on 10/25/23 by a video and audio enabled telemedicine application and verified that I am speaking with the correct person using two identifiers.  Patient Location: Home  Provider Location: Office/Clinic  I discussed the limitations of evaluation and management by telemedicine. The patient expressed understanding and agreed to proceed.  Vital Signs: Because this visit was a virtual/telehealth visit, some criteria may be missing or patient reported. Any vitals not documented were not able to be obtained and vitals that have been documented are patient reported.  Patient Medicare AWV questionnaire was completed by the patient with me on 10/25/2023; I have confirmed that all information answered by patient is correct and no changes since this date.  Cardiac Risk Factors include: advanced age (>43men, >39 women);diabetes mellitus;dyslipidemia     Objective:    Today's Vitals   10/25/23 1405  Temp: (!) 96.1 F (35.6 C)   There is no height or weight on file to calculate BMI.     10/25/2023    2:57 PM 11/17/2022    8:53 PM 12/05/2021   11:16 AM 12/05/2021   10:14 AM 09/12/2021    1:57 PM 09/07/2020    2:28 PM 09/15/2019    8:19 AM  Advanced Directives  Does Patient Have a Medical Advance Directive? No No No Yes No Yes Yes  Type of Advance Directive    Living will  Healthcare Power of [place];Living will Living will  Does patient want to make changes to medical advance directive?   No - Patient declined No - Patient declined     Copy of Healthcare Power of Attorney in Chart?      No - copy requested   Would patient like information on creating a medical advance directive? Yes (ED - Information included in AVS)  No - Patient declined  No - Patient declined      Current Medications  (verified) Outpatient Encounter Medications as of 10/25/2023  Medication Sig   amLODipine  (NORVASC ) 10 MG tablet TAKE 1 TABLET(10 MG) BY MOUTH DAILY   b complex vitamins tablet Take 1 tablet by mouth daily.   Cholecalciferol (VITAMIN D -3 PO) Take 2,000 Units by mouth daily.   Fish Oil-Cholecalciferol (FISH OIL + D3 PO) Take 1,200 mg by mouth daily.    fluticasone  (FLONASE ) 50 MCG/ACT nasal spray SHAKE LIQUID AND USE 2 SPRAYS IN EACH NOSTRIL DAILY   glimepiride  (AMARYL ) 1 MG tablet Take 1 tablet (1 mg total) by mouth daily with breakfast.   losartan  (COZAAR ) 100 MG tablet TAKE 1 TABLET BY MOUTH EVERY DAY   Multiple Vitamin (MULTIVITAMIN) tablet Take 1 tablet by mouth daily. OVER 50 VITAMIN   omeprazole  (PRILOSEC) 40 MG capsule Take 1 capsule (40 mg total) by mouth daily.   Safety Seal Miscellaneous MISC Apply 1 Application topically in the morning. Medication Name: Hormonic Hair Solution (Minoxidil 8% and Finasteride 0.05%)   sitaGLIPtin -metformin  (JANUMET ) 50-1000 MG tablet Take 1 tablet by mouth daily at 6 (six) AM.   tretinoin  (RETIN-A ) 0.025 % cream Apply topically at bedtime. Apply pea size amount to affected areas of face and neck at bedtime 2 nights per week x 2 weeks then increase to 3 nights per week if tolerated.   [DISCONTINUED] Cholecalciferol (VITAMIN D3) 250 MCG (10000 UT) TABS Take by mouth daily.   [DISCONTINUED] diazepam  (VALIUM ) 5 MG  tablet Take by mouth.   No facility-administered encounter medications on file as of 10/25/2023.    Allergies (verified) Aspirin   History: Past Medical History:  Diagnosis Date   Allergy    Arthritis    fingers   Colon polyp    Diabetes mellitus    Diverticulosis    GERD (gastroesophageal reflux disease)    occasionally   H/O hiatal hernia    ?   Hemorrhoids    Hyperlipidemia    takes fish oil   Hypertension    Infectious colitis    Recurrent upper respiratory infection (URI)    Ventral hernia    Past Surgical History:   Procedure Laterality Date   colonscopy     DIAGNOSTIC LAPAROSCOPY     FOOT SURGERY Bilateral    hammer toes   HEMORRHOID SURGERY     HERNIA REPAIR  01/20/2011   incisional   INCISIONAL HERNIA REPAIR  01/20/2011   Procedure: LAPAROSCOPIC INCISIONAL HERNIA;  Surgeon: Redell Alm Faith, DO;  Location: [facility];  Service: General;  Laterality: N/A;  Laparoscopic repair of a recurrent incisional hernia with Mesh   INGUINAL HERNIA REPAIR     x's 3   VENTRAL HERNIA REPAIR     Family History  Problem Relation Age of Onset   Breast cancer Mother    Diabetes Mother    Dementia Mother    Liver cancer Mother    Heart disease Father        heart attack   Diabetes Brother    Breast cancer Sister    Colon cancer Neg Hx    Stomach cancer Neg Hx    Social History   Socioeconomic History   Marital status: Single    Spouse name: Not on file   Number of children: 1   Years of education: Not on file   Highest education level: Not on file  Occupational History   Occupation: assessment specialist-retired    Comment: city of [place]  Tobacco Use   Smoking status: Former    Current packs/day: 0.00    Average packs/day: 1 pack/day for 20.0 years (20.0 ttl pk-yrs)    Types: Cigarettes    Start date: 01/05/1976    Quit date: 01/05/1996    Years since quitting: 27.8   Smokeless tobacco: Never  Vaping Use   Vaping status: Never Used  Substance and Sexual Activity   Alcohol use: Yes    Alcohol/week: 4.0 standard drinks of alcohol    Types: 4 Glasses of wine per week    Comment: occasional wine   Drug use: No   Sexual activity: Not Currently  Other Topics Concern   Not on file  Social History Narrative   Exercise 3-4  times a week   Social Drivers of Health   Financial Resource Strain: Low Risk  (10/25/2023)   Overall Financial Resource Strain (CARDIA)    Difficulty of Paying Living Expenses: Not hard at all  Food Insecurity: No Food Insecurity (10/25/2023)   Hunger Vital Sign     Worried About Running Out of Food in the Last Year: Never true    Ran Out of Food in the Last Year: Never true  Transportation Needs: No Transportation Needs (10/25/2023)   PRAPARE - Administrator, Civil Service (Medical): No    Lack of Transportation (Non-Medical): No  Physical Activity: Sufficiently Active (10/25/2023)   Exercise Vital Sign    Days of Exercise per Week: 7 days  Minutes of Exercise per Session: 30 min  Stress: No Stress Concern Present (10/25/2023)   [employer] of Occupational Health - Occupational Stress Questionnaire    Feeling of Stress: Only a little  Recent Concern: Stress - Stress Concern Present (08/20/2023)   [employer] of Occupational Health - Occupational Stress Questionnaire    Feeling of Stress: To some extent  Social Connections: Socially Isolated (10/25/2023)   Social Connection and Isolation Panel    Frequency of Communication with Friends and Family: More than three times a week    Frequency of Social Gatherings with Friends and Family: Three times a week    Attends Religious Services: Never    Active Member of Clubs or Organizations: No    Attends Banker Meetings: Never    Marital Status: Divorced    Tobacco Counseling Counseling given: Not Answered NON-SMOKER, counseling not needed  Clinical Intake:  Pre-visit preparation completed: No  Pain : No/denies pain     Nutritional Risks: None Diabetes: Yes CBG done?: Yes CBG resulted in Enter/ Edit results?: No Did pt. bring in CBG monitor from home?: No  How often do you need to have someone help you when you read instructions, pamphlets, or other written materials from your doctor or pharmacy?: 1 - Never  Interpreter Needed?: No      Activities of Daily Living    10/25/2023    2:41 PM  In your present state of health, do you have any difficulty performing the following activities:  Hearing? 0  Vision? 1  Difficulty concentrating or making  decisions? 0  Walking or climbing stairs? 0  Dressing or bathing? 0  Doing errands, shopping? 0  Preparing Food and eating ? N  Using the Toilet? N  In the past six months, have you accidently leaked urine? Y  Comment only when she holds it for too long  Do you have problems with loss of bowel control? N  Managing your Medications? N  Managing your Finances? N  Housekeeping or managing your Housekeeping? N    Patient Care Team: Ozell Megan HERO, MD as PCP - General (Family Medicine) Lonni Slain, MD as PCP - Cardiology (Cardiology) Johnnye Ade, MD as Consulting Physician (Obstetrics and Gynecology) [facility], [facility]  Indicate any recent Medical Services you may have received from other than [facility] providers in the past year (date may be approximate).     Assessment:   This is a routine wellness examination for Megan [name].  Hearing/Vision screen No results found.   Goals Addressed   None    Depression Screen    10/25/2023    2:07 PM 08/20/2023   10:33 AM 12/26/2022    2:16 PM 09/21/2022    5:16 PM 12/23/2021   12:09 PM 09/12/2021    2:11 PM 08/09/2021    4:18 PM  PHQ 2/9 Scores  PHQ - 2 Score 0 1 0 0 0 3 2  PHQ- 9 Score 1 4 7  8 6 7     Fall Risk    10/25/2023    2:07 PM 08/20/2023   10:32 AM 12/26/2022    2:17 PM 09/21/2022    5:16 PM 09/12/2021    1:57 PM  Fall Risk   Falls in the past year? 1 1 1  0 0  Number falls in past yr: 0 0 0 0 0  Injury with Fall? 0 0 0 0 0  Risk for fall due to : Impaired balance/gait Other (Comment) No Fall Risks  No Fall Risks  Risk for fall due to: Comment  got up quickly last night after hearing something over night     Follow up Falls evaluation completed Falls evaluation completed Falls evaluation completed Falls evaluation completed Falls evaluation completed      Data saved with a previous flowsheet row definition    MEDICARE RISK AT HOME: Medicare Risk at Home Any stairs in or around the home?: Yes If  so, are there any without handrails?: No Home free of loose throw rugs in walkways, pet beds, electrical cords, etc?: Yes Adequate lighting in your home to reduce risk of falls?: Yes Life alert?: No Use of a cane, walker or w/c?: No Grab bars in the bathroom?: Yes Shower chair or bench in shower?: No Elevated toilet seat or a handicapped toilet?: No  TIMED UP AND GO:  Was the test performed?  No    Cognitive Function:        10/25/2023    2:46 PM 09/12/2021    2:17 PM  6CIT Screen  What Year? 0 points 0 points  What month? 0 points 0 points  What time? 0 points 0 points  Count back from 20 0 points 0 points  Months in reverse 0 points 0 points  Repeat phrase 0 points 0 points  Total Score 0 points 0 points    Immunizations Immunization History  Administered Date(s) Administered   Fluad Quad(high Dose 65+) 10/29/2020   Influenza Whole 11/06/2011   Influenza-Unspecified 09/16/2021, 10/17/2022, 10/01/2023   PFIZER(Purple Top)SARS-COV-2 Vaccination 03/27/2019, 04/16/2019, 11/16/2019   Pneumococcal Conjugate-13 01/03/2019   Pneumococcal Polysaccharide-23 07/01/2013, 12/23/2021   Tdap 05/03/2012   Zoster, Live 07/01/2013    TDAP status: Due, Education has been provided regarding the importance of this vaccine. Advised may receive this vaccine at local pharmacy or Health Dept. Aware to provide a copy of the vaccination record if obtained from local pharmacy or Health Dept. Verbalized acceptance and understanding.  Flu Vaccine status: Up to date  Pneumococcal vaccine status: Up to date  Covid-19 vaccine status: Completed vaccines  Qualifies for Shingles Vaccine? Yes   Zostavax completed Yes   Shingrix  Completed?: No.    Education has been provided regarding the importance of this vaccine. Patient has been advised to call insurance company to determine out of pocket expense if they have not yet received this vaccine. Advised may also receive vaccine at local pharmacy or  Health Dept. Verbalized acceptance and understanding.  Screening Tests Health Maintenance  Topic Date Due   DTaP/Tdap/Td (2 - Td or Tdap) 05/04/2022   COVID-19 Vaccine (4 - 2025-26 season) 09/17/2023   Mammogram  09/20/2023   Zoster Vaccines- Shingrix  (1 of 2) 11/20/2023 (Originally 03/19/1974)   HEMOGLOBIN A1C  11/28/2023   Diabetic kidney evaluation - Urine ACR  03/22/2024   FOOT EXAM  05/27/2024   OPHTHALMOLOGY EXAM  08/07/2024   Diabetic kidney evaluation - eGFR measurement  08/19/2024   Medicare Annual Wellness (AWV)  10/24/2024   Colonoscopy  10/26/2026   Pneumococcal Vaccine: 50+ Years  Completed   Influenza Vaccine  Completed   DEXA SCAN  Completed   Hepatitis C Screening  Completed   Meningococcal B Vaccine  Aged Out    Health Maintenance  Health Maintenance Due  Topic Date Due   DTaP/Tdap/Td (2 - Td or Tdap) 05/04/2022   COVID-19 Vaccine (4 - 2025-26 season) 09/17/2023   Mammogram  09/20/2023    Colorectal cancer screening: Type of screening: Colonoscopy. Completed 2018. Repeat  every 10 years  Mammogram status: Completed 2 weeks ago. Repeat every year, getting records from [facility].  Bone Density status: Completed 2018. Results reflect: Bone density results: NORMAL. Repeat every 2-4 years. Pt states she had one more recently at [facility], will ask for records.  Lung Cancer Screening: (Low Dose CT Chest recommended if Age 24-80 years, 20 pack-year currently smoking OR have quit w/in 15years.) does not qualify.   Lung Cancer Screening Referral: N/A  Additional Screening:  Hepatitis C Screening: does qualify; Completed 2017  Vision Screening: Recommended annual ophthalmology exams for early detection of glaucoma and other disorders of the eye. Is the patient up to date with their annual eye exam?  Yes  Who is the provider or what is the name of the office in which the patient attends annual eye exams? Goes to [facility] on [address]. If pt is not established with a provider, would they like to be referred to a provider to establish care? No .   Dental Screening: Recommended annual dental exams for proper oral hygiene  Diabetic Foot Exam: Diabetic Foot Exam: Completed 05/28/23  Community Resource Referral / Chronic Care Management: CRR required this visit?  No   CCM required this visit?  No     Plan:     I have personally reviewed and noted the following in the patient's chart:   Medical and social history Use of alcohol, tobacco or illicit drugs  Current medications and supplements including opioid prescriptions. Patient is not currently taking opioid prescriptions. Functional ability and status Nutritional status Physical activity Advanced directives List of other physicians Hospitalizations, surgeries, and ER visits in previous 12 months Vitals Screenings to include cognitive, depression, and falls Referrals and appointments  In addition, I have reviewed and discussed with patient certain preventive protocols, quality metrics, and best practice recommendations. A written personalized care plan for preventive services as well as general preventive health recommendations were provided to patient.     Megan CHRISTELLA Sharper, MD   10/25/2023   After Visit Summary: (Mail) Due to this being a telephonic visit, the after visit summary with patients personalized plan was offered to patient via mail   Nurse Notes: N/A

## 2023-10-25 NOTE — Patient Instructions (Signed)
 Tetanus booster is due

## 2023-10-30 ENCOUNTER — Other Ambulatory Visit: Payer: Self-pay | Admitting: Family Medicine

## 2023-10-30 ENCOUNTER — Encounter: Payer: Self-pay | Admitting: Obstetrics and Gynecology

## 2023-10-30 DIAGNOSIS — R0981 Nasal congestion: Secondary | ICD-10-CM

## 2023-11-08 ENCOUNTER — Encounter (INDEPENDENT_AMBULATORY_CARE_PROVIDER_SITE_OTHER): Payer: Self-pay

## 2023-11-09 ENCOUNTER — Ambulatory Visit (INDEPENDENT_AMBULATORY_CARE_PROVIDER_SITE_OTHER): Admitting: Family Medicine

## 2023-11-09 VITALS — BP 130/68 | HR 77 | Temp 98.3°F | Ht 65.0 in | Wt 185.6 lb

## 2023-11-09 DIAGNOSIS — Z01818 Encounter for other preprocedural examination: Secondary | ICD-10-CM

## 2023-11-09 NOTE — Progress Notes (Signed)
 Established Patient Office Visit  Subjective   Patient ID: Megan [name], female    DOB: 11-25-1955  Age: 68 y.o. MRN: [id number]  Chief Complaint  Patient presents with   Medical Management of Chronic Issues    States she can't do MRI so she is going to sleep to have it done.     HPI Discussed the use of AI scribe software for clinical note transcription with the patient, who gave verbal consent to proceed.  History of Present Illness   Megan [name] is a 68 year old female who presents for a pre-sedation history and physical for an upcoming MRI.  She requires sedation for the MRI due to claustrophobia. She has thyroid  issues, including a nodule or goiter, and has had previous thyroid  ultrasounds. She continues to follow up with Endo every 6 months and last thyroid  US  was in 2020 and was stable in size.  She experiences head pain, attributed to benign lesions, prompting the MRI.   Her surgical history includes four hernia repairs, hemorrhoid surgery, and foot surgery. She has tolerated sedation well in past procedures, including colonoscopy, with no anesthesia complications. She has no history of sleep apnea and does not use a CPAP machine. An overnight study showed no sleep apnea. She experiences a twitch without a definitive diagnosis and is compliant with her medications.       Current Outpatient Medications  Medication Instructions   amLODipine  (NORVASC ) 10 MG tablet TAKE 1 TABLET(10 MG) BY MOUTH DAILY   b complex vitamins tablet 1 tablet, Daily   Cholecalciferol (VITAMIN D -3 PO) 2,000 Units, Daily   Fish Oil-Cholecalciferol (FISH OIL + D3 PO) 1,200 mg, Daily   fluticasone  (FLONASE ) 50 MCG/ACT nasal spray SHAKE LIQUID AND USE 2 SPRAYS IN EACH NOSTRIL DAILY   glimepiride  (AMARYL ) 1 mg, Oral, Daily with breakfast   losartan  (COZAAR ) 100 mg, Oral, Daily   Multiple Vitamin (MULTIVITAMIN) tablet 1 tablet, Daily   omeprazole  (PRILOSEC) 40 mg, Oral, Daily   Safety Seal  Miscellaneous MISC 1 Application, Topical, Every morning, Medication Name: Hormonic Hair Solution (Minoxidil 8% and Finasteride 0.05%)   sitaGLIPtin -metformin  (JANUMET ) 50-1000 MG tablet 1 tablet, Oral, Daily   tretinoin  (RETIN-A ) 0.025 % cream Topical, Daily at bedtime, Apply pea size amount to affected areas of face and neck at bedtime 2 nights per week x 2 weeks then increase to 3 nights per week if tolerated.    Patient Active Problem List   Diagnosis Date Noted   Atherosclerosis of artery of both lower extremities 07/30/2023   Atypical chest pain 01/19/2022   DDD (degenerative disc disease), lumbar 08/10/2021   Right knee pain 06/17/2021   Low back pain with radiation 12/21/2020   Preventative health care 12/21/2020   At risk for cardiovascular event 11/23/2020   Type 2 diabetes mellitus without complication, without long-term current use of insulin (HCC) 11/23/2020   Multinodular goiter 11/23/2020   Generalized abdominal pain 02/19/2020   History of hernia repair 02/19/2020   Hair loss 02/17/2020   Controlled diabetes mellitus type 2 with complications (HCC) 02/17/2020   Diabetes mellitus without complication (HCC) 11/11/2019   Degenerative lumbar spinal stenosis 09/04/2019   Degenerative spondylolisthesis 09/04/2019   Neck pain 09/04/2019   Type 2 diabetes mellitus with hyperglycemia, without long-term current use of insulin (HCC) 09/02/2019   Dyslipidemia 09/02/2019   Uncontrolled type 2 diabetes mellitus with hyperglycemia (HCC) 11/05/2018   Fatigue 11/05/2018   Acute non-recurrent pansinusitis 04/25/2018   Bronchitis 05/21/2017  CAP (community acquired pneumonia) 10/15/2013   Wheezing 09/29/2013   Pain, knee 10/02/2012   Gastrocnemius muscle strain 10/02/2012   Wrist sprain 10/02/2012   Thyroid  nodule 05/03/2012   Rash and nonspecific skin eruption 03/14/2012   Alopecia 03/28/2010   History of colonic polyps 11/09/2009   HEMANGIOMA, HEPATIC 11/04/2009   UNSPECIFIED  ANEMIA 11/04/2009   DIVERTICULOSIS, COLON 11/04/2009   FATTY LIVER DISEASE 11/04/2009   PALPITATIONS 09/06/2009   Chronic fatigue 08/07/2008   Allergic rhinitis 04/10/2007   Diabetes mellitus type II, controlled (HCC) 12/21/2006   Hyperlipidemia 04/12/2006   HTN (hypertension) 04/12/2006     Review of Systems  All other systems reviewed and are negative.     Objective:     BP 130/68   Pulse 77   Temp 98.3 F (36.8 C) (Oral)   Ht 5' 5 (1.651 m)   Wt 185 lb 9.6 oz (84.2 kg)   SpO2 99%   BMI 30.89 kg/m    Physical Exam Vitals reviewed.  Constitutional:      Appearance: Normal appearance. She is well-groomed and normal weight.  Eyes:     Conjunctiva/sclera: Conjunctivae normal.  Neck:     Thyroid : No thyromegaly (sligh prominence in the center).  Cardiovascular:     Rate and Rhythm: Normal rate and regular rhythm.     Pulses: Normal pulses.     Heart sounds: S1 normal and S2 normal.  Pulmonary:     Effort: Pulmonary effort is normal.     Breath sounds: Normal breath sounds and air entry.  Abdominal:     General: Bowel sounds are normal.  Musculoskeletal:     Right lower leg: No edema.     Left lower leg: No edema.  Neurological:     Mental Status: She is alert and oriented to person, place, and time. Mental status is at baseline.     Gait: Gait is intact.  Psychiatric:        Mood and Affect: Mood and affect normal.        Speech: Speech normal.        Behavior: Behavior normal.        Judgment: Judgment normal.      No results found for any visits on 11/09/23.    The 10-year ASCVD risk score (Arnett DK, et al., 2019) is: 22.9%    Assessment & Plan:  Encounter for preoperative assessment   Normal physical exam findings except for prominence of the thyroid  gland, continuing to be followed by endo. She does not require any further testing prior to undergoing anesthesia for the MRI. 20 minutes spent with patient today filling out forms and completing  the physical.   No follow-ups on file.    Heron CHRISTELLA Sharper, MD

## 2023-11-20 NOTE — Telephone Encounter (Signed)
 [facility] closed out Megan [name]'s referral due to no contact.

## 2023-11-23 ENCOUNTER — Ambulatory Visit: Admitting: Family Medicine

## 2023-11-23 ENCOUNTER — Ambulatory Visit: Payer: Self-pay

## 2023-11-23 ENCOUNTER — Encounter: Payer: Self-pay | Admitting: Family Medicine

## 2023-11-23 VITALS — BP 132/60 | HR 85 | Temp 98.6°F | Ht 65.0 in | Wt 187.6 lb

## 2023-11-23 DIAGNOSIS — J069 Acute upper respiratory infection, unspecified: Secondary | ICD-10-CM | POA: Diagnosis not present

## 2023-11-23 MED ORDER — GUAIFENESIN-CODEINE 100-10 MG/5ML PO SOLN: 5.0000 mL | Freq: Three times a day (TID) | ORAL | 0 refills | Status: AC | PRN

## 2023-11-23 NOTE — Telephone Encounter (Signed)
 Appt today at 11am

## 2023-11-23 NOTE — Telephone Encounter (Signed)
 FYI Only or Action Required?: FYI only for provider: appointment scheduled on 11/23/23.  Patient was last seen in primary care on 11/09/2023 by [name], MD.  Called Nurse Triage reporting Cough.  Symptoms began several days ago.  Interventions attempted: OTC medications: saline spray, mucinex .  Symptoms are: gradually worsening.  Triage Disposition: See Physician Within 24 Hours  Patient/caregiver understands and will follow disposition?: Yes        Copied from CRM #[id number]. Topic: Clinical - Red Word Triage >> Nov 23, 2023  8:22 AM Megan [name] wrote: Red Word that prompted transfer to Nurse Triage: cold, unable to sleep, not resolving with medication and  hard/productive cough with discolored mucous. Requesting a medication or recommendation since no OTC medication are working recent appt with Dr. Nena 11/09/23. Reason for Disposition  SEVERE coughing spells (e.g., whooping sound after coughing, vomiting after coughing)  Answer Assessment - Initial Assessment Questions 1. ONSET: When did the cough begin?      Last week 2. SEVERITY: How bad is the cough today?      Chest congestion 3. SPUTUM: Describe the color of your sputum (e.g., none, dry cough; clear, white, yellow, green)     Productive gray cough 4. HEMOPTYSIS: Are you coughing up any blood? If Yes, ask: How much? (e.g., flecks, streaks, tablespoons, etc.)     denies 5. DIFFICULTY BREATHING: Are you having difficulty breathing? If Yes, ask: How bad is it? (e.g., mild, moderate, severe)      Congestion worse at night 6. FEVER: Do you have a fever? If Yes, ask: What is your temperature, how was it measured, and when did it start?     denies 7. CARDIAC HISTORY: Do you have any history of heart disease? (e.g., heart attack, congestive heart failure)      Denies. Endorses HTN 8. LUNG HISTORY: Do you have any history of lung disease?  (e.g., pulmonary embolus, asthma, emphysema)      denies 9. PE RISK FACTORS: Do you have a history of blood clots? (or: recent major surgery, recent prolonged travel, bedridden)     denies 10. OTHER SYMPTOMS: Do you have any other symptoms? (e.g., runny nose, wheezing, chest pain)       Runny nose. 11. PREGNANCY: Is there any chance you are pregnant? When was your last menstrual period?       N/a 12. TRAVEL: Have you traveled out of the country in the last month? (e.g., travel history, exposures)       Reports saw grandchildren that had URI sx  Protocols used: Cough - Acute Productive-A-AH

## 2023-11-23 NOTE — Progress Notes (Signed)
 Established Patient Office Visit  Subjective   Patient ID: Megan [name], female    DOB: 05/03/1955  Age: 68 y.o. MRN: [id number]  Chief Complaint  Patient presents with   Cough    Productive with yellow sputum and chest pain/congestion x6 days, taking Mucinex  and OTC diabetic cough syrup with no relief    Cough   Pt reports acute URI symptoms, states she gets the same thing twice a year, reports no fever/chills, is getting a lot of coughing,  yellow sputum, nasal congestion, sinus pressure, throat pain, etc. States she saw her grandchildren on halloween and they were also ill. She went out in the neighborhood. Did not test for covid or flu at home.   Current Outpatient Medications  Medication Instructions   amLODipine  (NORVASC ) 10 MG tablet TAKE 1 TABLET(10 MG) BY MOUTH DAILY   b complex vitamins tablet 1 tablet, Daily   Cholecalciferol (VITAMIN D -3 PO) 2,000 Units, Daily   Fish Oil-Cholecalciferol (FISH OIL + D3 PO) 1,200 mg, Daily   fluticasone  (FLONASE ) 50 MCG/ACT nasal spray SHAKE LIQUID AND USE 2 SPRAYS IN EACH NOSTRIL DAILY   glimepiride  (AMARYL ) 1 mg, Oral, Daily with breakfast   guaiFENesin -codeine  100-10 MG/5ML syrup 5-10 mLs, Oral, 3 times daily PRN   losartan  (COZAAR ) 100 mg, Oral, Daily   Multiple Vitamin (MULTIVITAMIN) tablet 1 tablet, Daily   omeprazole  (PRILOSEC) 40 mg, Oral, Daily   Safety Seal Miscellaneous MISC 1 Application, Topical, Every morning, Medication Name: Hormonic Hair Solution (Minoxidil 8% and Finasteride 0.05%)   sitaGLIPtin -metformin  (JANUMET ) 50-1000 MG tablet 1 tablet, Oral, Daily   tretinoin  (RETIN-A ) 0.025 % cream Topical, Daily at bedtime, Apply pea size amount to affected areas of face and neck at bedtime 2 nights per week x 2 weeks then increase to 3 nights per week if tolerated.    Patient Active Problem List   Diagnosis Date Noted   Atherosclerosis of artery of both lower extremities 07/30/2023   Atypical chest pain 01/19/2022    DDD (degenerative disc disease), lumbar 08/10/2021   Right knee pain 06/17/2021   Low back pain with radiation 12/21/2020   Preventative health care 12/21/2020   At risk for cardiovascular event 11/23/2020   Type 2 diabetes mellitus without complication, without long-term current use of insulin (HCC) 11/23/2020   Multinodular goiter 11/23/2020   Generalized abdominal pain 02/19/2020   History of hernia repair 02/19/2020   Hair loss 02/17/2020   Controlled diabetes mellitus type 2 with complications (HCC) 02/17/2020   Diabetes mellitus without complication (HCC) 11/11/2019   Degenerative lumbar spinal stenosis 09/04/2019   Degenerative spondylolisthesis 09/04/2019   Neck pain 09/04/2019   Type 2 diabetes mellitus with hyperglycemia, without long-term current use of insulin (HCC) 09/02/2019   Dyslipidemia 09/02/2019   Uncontrolled type 2 diabetes mellitus with hyperglycemia (HCC) 11/05/2018   Fatigue 11/05/2018   Acute non-recurrent pansinusitis 04/25/2018   Bronchitis 05/21/2017   CAP (community acquired pneumonia) 10/15/2013   Wheezing 09/29/2013   Pain, knee 10/02/2012   Gastrocnemius muscle strain 10/02/2012   Wrist sprain 10/02/2012   Thyroid  nodule 05/03/2012   Rash and nonspecific skin eruption 03/14/2012   Alopecia 03/28/2010   History of colonic polyps 11/09/2009   HEMANGIOMA, HEPATIC 11/04/2009   UNSPECIFIED ANEMIA 11/04/2009   DIVERTICULOSIS, COLON 11/04/2009   FATTY LIVER DISEASE 11/04/2009   PALPITATIONS 09/06/2009   Chronic fatigue 08/07/2008   Allergic rhinitis 04/10/2007   Diabetes mellitus type II, controlled (HCC) 12/21/2006   Hyperlipidemia 04/12/2006  HTN (hypertension) 04/12/2006     Review of Systems  Respiratory:  Positive for cough.   All other systems reviewed and are negative.     Objective:     BP 132/60   Pulse 85   Temp 98.6 F (37 C) (Oral)   Ht 5' 5 (1.651 m)   Wt 187 lb 9.6 oz (85.1 kg)   SpO2 95%   BMI 31.22 kg/m     Physical Exam Vitals reviewed.  Constitutional:      Appearance: Normal appearance. She is obese.  HENT:     Right Ear: Tympanic membrane normal.     Left Ear: Tympanic membrane normal.     Nose: Congestion and rhinorrhea present.     Mouth/Throat:     Mouth: Mucous membranes are moist.     Pharynx: No posterior oropharyngeal erythema.  Eyes:     Conjunctiva/sclera: Conjunctivae normal.  Cardiovascular:     Rate and Rhythm: Normal rate and regular rhythm.     Heart sounds: Normal heart sounds.  Pulmonary:     Effort: Pulmonary effort is normal.     Breath sounds: No wheezing, rhonchi or rales.  Lymphadenopathy:     Cervical: No cervical adenopathy.  Neurological:     Mental Status: She is alert.      No results found for any visits on 11/23/23.    The 10-year ASCVD risk score (Arnett DK, et al., 2019) is: 23.5%    Assessment & Plan:  Viral URI -     guaiFENesin -Codeine ; Take 5-10 mLs by mouth 3 (three) times daily as needed for cough.  Dispense: 237 mL; Refill: 0   Most likely viral, no signs of bacterial infection on exam, will give pt PRN cough suppressant and advised she use symptomatic care OTC.   No follow-ups on file.    Heron CHRISTELLA Sharper, MD

## 2023-11-26 ENCOUNTER — Telehealth: Admitting: Internal Medicine

## 2023-11-26 ENCOUNTER — Encounter: Payer: Self-pay | Admitting: Internal Medicine

## 2023-11-26 DIAGNOSIS — Z7984 Long term (current) use of oral hypoglycemic drugs: Secondary | ICD-10-CM | POA: Diagnosis not present

## 2023-11-26 DIAGNOSIS — E119 Type 2 diabetes mellitus without complications: Secondary | ICD-10-CM | POA: Diagnosis not present

## 2023-11-26 MED ORDER — GLIMEPIRIDE 1 MG PO TABS: 1.0000 mg | ORAL_TABLET | Freq: Every day | ORAL | 3 refills | Status: AC

## 2023-11-26 MED ORDER — JANUMET 50-1000 MG PO TABS: 1.0000 | ORAL_TABLET | Freq: Every day | ORAL | 3 refills | Status: AC

## 2023-11-26 NOTE — Progress Notes (Signed)
 Virtual Visit via Video Note  I connected with Megan MALVA Mako on 11/26/23 at  7:50 AM EST by a video enabled telemedicine application and verified that I am speaking with the correct person using two identifiers.   I discussed the limitations of evaluation and management by telemedicine and the availability of in person appointments. The patient expressed understanding and agreed to proceed.   -Location of the patient : home -Location of the provider : Office -The names of all persons participating in the telemedicine service : Pt and myself      Name: Megan [name]  Age/ Sex: 68 y.o., female   MRN/ DOB: [id number], September 03, 1955     PCP: [name], MD   Reason for Endocrinology Evaluation: Type 2 Diabetes Mellitus  Initial Endocrine Consultative Visit: 09/02/2019    PATIENT IDENTIFIER: Megan [name] is a 68 y.o. female with a past medical history of T2DM, HTN and Dyslipidemia. The patient has followed with Endocrinology clinic since 09/02/2019 for consultative assistance with management of her diabetes.  DIABETIC HISTORY:  [name] was diagnosed with DM in 2008.She has been on Metformin , Glimepiride , repaglinide  and Januvia  . Her hemoglobin A1c has ranged from 6.5% in 2014, peaking at 7.4% in 10/2018  On her initial visit to our clinic she has an A1c of 7.3% . She was on Repaglinide  which we stopped due to hypoglycemia, Decreased Glimepiride  and continued Janumet .   SUBJECTIVE:   During the last visit (05/28/2023): A1c 6.7 %     Today (11/26/2023): [name] is here for a follow up on diabetes management.  She checks her blood sugars 1 times daily. The patient has not had hypoglycemic episodes since the last clinic visit.   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  She continues with dry cough but no fever, she has an allergy testing appointment next week  Patient attributes hyperglycemia due to OTC medications The patient does endorse episodes during the day of feeling nauseous and sweaty, she does not check her glucose during these episodes but has been consuming sugar tablets  No vomiting Has occasional constipation, takes magnesium citrate No diarrhea    HOME DIABETES REGIMEN:  Janumet  50-1000 mg daily  Glimepiride  1 mg , 1 tabs daily     Statin: yes  ACE-I/ARB: yes    METER DOWNLOAD SUMMARY:   This a.m. 161 MGs/DL  DIABETIC COMPLICATIONS: Microvascular complications:   Denies: CKD, retinopathy, neuropathy Last Eye Exam: Completed 08/08/2023    Macrovascular complications:   Denies: CAD, CVA, PVD   HISTORY:  Past Medical History:  Past Medical History:  Diagnosis Date   Allergy    Arthritis    fingers   Colon polyp    Diabetes mellitus    Diverticulosis    GERD (gastroesophageal reflux disease)    occasionally   H/O hiatal hernia    ?   Hemorrhoids    Hyperlipidemia    takes fish oil   Hypertension    Infectious colitis    Recurrent upper respiratory infection (URI)    Ventral hernia  Past Surgical History:  Past Surgical History:  Procedure Laterality Date   colonscopy     DIAGNOSTIC LAPAROSCOPY     FOOT SURGERY Bilateral    hammer toes   HEMORRHOID SURGERY     HERNIA REPAIR  01/20/2011   incisional   INCISIONAL HERNIA REPAIR  01/20/2011   Procedure: LAPAROSCOPIC INCISIONAL HERNIA;  Surgeon: Redell Alm Faith, DO;  Location: [facility];  Service: General;  Laterality: N/A;  Laparoscopic repair of a recurrent incisional hernia with Mesh   INGUINAL  HERNIA REPAIR     x's 3   VENTRAL HERNIA REPAIR     Social History:  reports that she quit smoking about 27 years ago. Her smoking use included cigarettes. She started smoking about 47 years ago. She has a 20 pack-year smoking history. She has never used smokeless tobacco. She reports current alcohol use of about 4.0 standard drinks of alcohol per week. She reports that she does not use drugs. Family History:  Family History  Problem Relation Age of Onset   Breast cancer Mother    Diabetes Mother    Dementia Mother    Liver cancer Mother    Heart disease Father        heart attack   Diabetes Brother    Breast cancer Sister    Colon cancer Neg Hx    Stomach cancer Neg Hx      HOME MEDICATIONS: Allergies as of 11/26/2023       Reactions   Aspirin Nausea Only        Medication List        Accurate as of November 26, 2023  6:55 AM. If you have any questions, ask your nurse or doctor.          amLODipine  10 MG tablet Commonly known as: NORVASC  TAKE 1 TABLET(10 MG) BY MOUTH DAILY   b complex vitamins tablet Take 1 tablet by mouth daily.   FISH OIL + D3 PO Take 1,200 mg by mouth daily.   fluticasone  50 MCG/ACT nasal spray Commonly known as: FLONASE  SHAKE LIQUID AND USE 2 SPRAYS IN EACH NOSTRIL DAILY   glimepiride  1 MG tablet Commonly known as: AMARYL  Take 1 tablet (1 mg total) by mouth daily with breakfast.   guaiFENesin -codeine  100-10 MG/5ML syrup Take 5-10 mLs by mouth 3 (three) times daily as needed for cough.   Janumet  50-1000 MG tablet Generic drug: sitaGLIPtin -metformin  Take 1 tablet by mouth daily at 6 (six) AM.   losartan  100 MG tablet Commonly known as: COZAAR  TAKE 1 TABLET BY MOUTH EVERY DAY   multivitamin tablet Take 1 tablet by mouth daily. OVER 50 VITAMIN   omeprazole  40 MG capsule Commonly known as: PRILOSEC Take 1 capsule (40 mg total) by mouth daily.   Safety Seal Miscellaneous Misc Apply 1 Application topically in the morning.  Medication Name: Hormonic Hair Solution (Minoxidil 8% and Finasteride 0.05%)   tretinoin  0.025 % cream Commonly known as: RETIN-A  Apply topically at bedtime. Apply pea size amount to affected areas of face and neck at bedtime 2 nights per week x 2 weeks then increase to 3 nights per week if tolerated.   VITAMIN D -3 PO Take 2,000 Units by mouth daily.         OBJECTIVE:   Vital Signs: There were no vitals taken for this visit.  Wt Readings from Last 3 Encounters:  11/23/23 187 lb 9.6 oz (85.1 kg)  11/09/23 185 lb 9.6 oz (84.2 kg)  09/24/23 185 lb 8  oz (84.1 kg)     Exam: General: Pt appears well and is in NAD  Lungs: Clear with good BS bilat   Heart: RRR   Extremities: No pretibial edema.  Neuro: MS is good with appropriate affect, pt is alert and Ox3    DM foot exam  05/28/2023 The skin of the feet is intact without sores or ulcerations. The pedal pulses are 2+ on right and 2+ on left. The sensation is intact to a screening 5.07, 10 gram monofilament bilaterally     DATA REVIEWED:  Lab Results  Component Value Date   HGBA1C 6.7 (A) 05/28/2023   HGBA1C 6.8 (A) 11/23/2022   HGBA1C 6.3 (A) 05/29/2022    Latest Reference Range & Units 11/17/22 21:54  Sodium 135 - 145 mmol/L 137  Potassium 3.5 - 5.1 mmol/L 3.7  Chloride 98 - 111 mmol/L 105  CO2 22 - 32 mmol/L 23  Glucose 70 - 99 mg/dL 812 (H)  BUN 8 - 23 mg/dL 13  Creatinine 9.55 - 8.99 mg/dL 9.09  Calcium  8.9 - 10.3 mg/dL 9.6  Anion gap 5 - 15  9  GFR, Estimated >60 mL/min >60  (H): Data is abnormally high    Thyroid  ultrasound 05/30/2018   Estimated total number of nodules >/= 1 cm: 2   Number of spongiform nodules >/=  2 cm not described below (TR1): 0   Number of mixed cystic and solid nodules >/= 1.5 cm not described below (TR2): 0   _________________________________________________________   The previously biopsied mass in the right mid and lower gland measures 5.6 x 4.0 x 2.6 cm. This appears  grossly stable compared to prior although measurements were obtained in slightly different planes and so precise measurements are more challenging. However, the mass measured up to 5.2 cm in June of 2015. There has been no significant mitral change over the past 5 years.   Stable appearance of a multiple subcentimeter nodules scattered throughout the left mid gland. None of the nodules meet criteria for biopsy or further imaging surveillance.   IMPRESSION: No significant interval change in the size or appearance of the previously biopsied nodule occupying the majority of the right gland. Five year stability is consistent with benignity.   Multiple small nodules scattered throughout the left mid gland remain unchanged and do not meet criteria for further evaluation.   The above is in keeping with the ACR TI-RADS recommendations - J Am Coll Radiol 2017;14:587-595.    Right Nodule FNA 05/16/2012  THYROID , FINE NEEDLE ASPIRATION, RIGHT BENIGN. FINDINGS CONSISTENT WITH NON-NEOPLASTIC GOITER.    Old records , labs and images have been reviewed.    ASSESSMENT / PLAN / RECOMMENDATIONS:   1) Type 2 Diabetes Mellitus, Optimally controlled, Without complications - Most recent A1c of 6.7 %. Goal A1c < 7.0 %.     -Patient endorses episodes of nausea and sweating in the middle of the day, no glucose data, I did advise the patient to check glucose before consuming any sugar tablets, if BG's are below 70 MGs/DL, patient will have to discontinue glimepiride , patient denies hypoglycemia otherwise -She had declined switching Januvia  to GLP-1 agonist in the past - No changes at this time  MEDICATIONS: - Continue Glimepiride  1 mg, one  tablet before Breakfast  - Continue  Janumet  50-1000 mg ONCE daily   EDUCATION / INSTRUCTIONS: BG monitoring instructions: Patient is instructed to check her blood sugars 1 times a day, fasting . Call [facility] if: BG persistently <  70 I  reviewed the Rule of 15 for the treatment of hypoglycemia in detail with the patient. Literature supplied.   2) Diabetic complications:  Eye: Does not have known diabetic retinopathy.  Neuro/ Feet: Does not have known diabetic peripheral neuropathy .  Renal: Patient does not have known baseline CKD. She   is on an ACEI/ARB at present.      F/U in 6 months    Signed electronically by: Stefano Redgie Butts, MD  [facility]  [facility] [address] [place] [place], [place] [address] Phone: [phone] FAX: [phone]   CC: [name], MD [address] [place] [address] Phone: [phone]  Fax: [phone]  Return to Endocrinology clinic as below: Future Appointments  Date Time Provider Department Center  11/26/2023  7:50 AM Ellisha Bankson, Donell Redgie, MD [facility] None  11/29/2023 10:00 AM [facility] 2 [facility]  12/03/2023  9:30 AM Luke Orlan HERO, DO [facility] None  12/20/2023 12:45 PM Alm Delon SAILOR, DO [facility] None

## 2023-11-28 ENCOUNTER — Encounter (HOSPITAL_COMMUNITY): Payer: Self-pay | Admitting: *Deleted

## 2023-11-28 ENCOUNTER — Encounter (HOSPITAL_COMMUNITY): Payer: Self-pay | Admitting: Vascular Surgery

## 2023-11-28 NOTE — Progress Notes (Signed)
 MRI cancelled due to patient being dx with an URI on 11/23/23.

## 2023-11-29 ENCOUNTER — Encounter (HOSPITAL_COMMUNITY): Payer: Self-pay

## 2023-11-29 ENCOUNTER — Encounter: Admission: RE | Payer: Self-pay

## 2023-11-29 ENCOUNTER — Ambulatory Visit (HOSPITAL_COMMUNITY): Admission: RE | Admit: 2023-11-29 | Source: Home / Self Care

## 2023-11-29 ENCOUNTER — Ambulatory Visit (HOSPITAL_COMMUNITY)

## 2023-11-29 ENCOUNTER — Encounter (HOSPITAL_COMMUNITY): Payer: Self-pay | Admitting: Vascular Surgery

## 2023-11-29 SURGERY — MRI WITH ANESTHESIA
Anesthesia: General

## 2023-12-02 NOTE — Progress Notes (Unsigned)
 Skin testing note  RE: Megan [name] MRN: [id number] DOB: 07/21/1955 Date of Office Visit: 12/03/2023  Referring provider: [name], MD Primary care provider: [name], MD  Chief Complaint: skin testing  History of Present Illness: I had the pleasure of seeing Megan [name] for a skin testing visit at the [facility] on 12/03/2023. She is a 68 y.o. female, who is being followed for cough. Her previous allergy office visit was on 09/10/2023 with Dr. Luke. Today is a skin testing visit.   Discussed the use of AI scribe software for clinical note transcription with the patient, who gave verbal consent to proceed.    She experiences persistent coughing and throat clearing. She has not seen ENT yet.   She has been taking medication for heartburn and notes that she no longer experiences heartburn. However, she is unsure if the medication is directly helping with her coughing and throat clearing.  Allergy testing revealed positive results for several allergens, including grass, one tree, cat, and ragweed. She does not have a pet, and there is currently no pollen exposure due to the season. She suspects that dust in her house, which is 70 years old, might be contributing to her symptoms, although dust mite allergy was negative.     Assessment and Plan: Megan [name] is a 68 y.o. female with: Other allergic rhinitis Chronic cough History of tobacco use Past history - chronic cough for 20 years without wheezing or dyspnea. Normal chest X-ray. Differential includes postnasal drip, GERD, and environmental allergies. Smoking history may contribute. 2025 spirometry was normal pattern with no improvement in FEV1 post bronchodilator treatment. Clinically feeling unchanged.  12/03/2023 skin testing positive to grass, ragweed, one tree pollen, cat. Negative to common foods.  Discussed with patient I'm not sure how much of the above allergens are causing her current  symptoms as she has no cats at home and there is no pollen outdoors anymore.  Start environmental control measures as below. Use over the counter antihistamines such as Zyrtec (cetirizine), Claritin  (loratadine ), Allegra (fexofenadine), or Xyzal (levocetirizine) daily as needed.  May switch antihistamines every few months. ENT referral was placed at last visit.   Possible heartburn Improved with omeprazole . Continue lifestyle and dietary modifications. Continue omeprazole  40mg  once day - nothing to eat or drink for 20-30 minutes afterwards.   Return in about 6 months (around 06/01/2024).  No orders of the defined types were placed in this encounter.  Lab Orders  No laboratory test(s) ordered today    Diagnostics: Skin Testing: Environmental allergy panel and select foods. 12/03/2023 skin testing positive to grass, ragweed, one tree pollen, cat.  Negative to common foods.  Results discussed with patient/family.  Airborne Adult Perc - 12/03/23 0930     Time Antigen Placed 0930    Allergen Manufacturer Jestine    Number of Test 55    1. Control-Buffer 50% Glycerol Negative    2. Control-Histamine 3+    3. Bahia Negative    4. Bermuda Negative    5. Johnson 2+    6. [place]  Blue 2+    7. Meadow Fescue Negative    8. Perennial Rye Negative    9. Timothy Negative    10. Ragweed Mix 2+    11. Cocklebur Negative    12. Plantain,  English Negative    13. Baccharis Negative    14. Dog Fennel Negative    15. Russian Thistle Negative    16. Lamb's  Quarters Negative    17. Sheep Sorrell Negative    18. Rough Pigweed Negative    19. Marsh Elder, Rough Negative    20. Mugwort, Common Negative    21. Box, Elder Negative    22. Cedar, red 3+    23. Sweet Gum Negative    24. Pecan Pollen Negative    25. Pine Mix Negative    26. Walnut, Black Pollen Negative    27. Red Mulberry Negative    28. Ash Mix Negative    29. Birch Mix Negative    30. Beech American Negative    31.  Cottonwood, Eastern Negative    32. Hickory, White Negative    33. Maple Mix Negative    34. Oak, Eastern Mix Negative    35. Sycamore Eastern Negative    36. Alternaria Alternata Negative    37. Cladosporium Herbarum Negative    38. Aspergillus Mix Negative    39. Penicillium Mix Negative    40. Bipolaris Sorokiniana (Helminthosporium) Negative    41. Drechslera Spicifera (Curvularia) Negative    42. Mucor Plumbeus Negative    43. Fusarium Moniliforme Negative    44. Aureobasidium Pullulans (pullulara) Negative    45. Rhizopus Oryzae Negative    46. Botrytis Cinera Negative    47. Epicoccum Nigrum Negative    48. Phoma Betae Negative    49. Dust Mite Mix Negative    50. Cat Hair 10,000 BAU/ml Negative    51.  Dog Epithelia Negative    52. Mixed Feathers Negative    53. Horse Epithelia Negative    54. Cockroach, German Negative    55. Tobacco Leaf Negative          13 Food Perc - 12/03/23 0930       Test Information   Time Antigen Placed 0930    Allergen Manufacturer Jestine    Location Back    Number of allergen test 13      Food   1. Peanut Negative    2. Soybean Negative    3. Wheat Negative    4. Sesame Negative    5. Milk, Cow Negative    6. Casein Negative    7. Egg White, Chicken Negative    8. Shellfish Mix Negative    9. Fish Mix Negative    10. Cashew Negative    11. Walnut Food Negative    12. Almond Negative    13. Hazelnut Negative          Intradermal - 12/03/23 0954     Time Antigen Placed 9045    Allergen Manufacturer Jestine    Location Arm    Number of Test 13    Control Negative    Bahia Negative    Bermuda Negative    Weed Mix Negative    Tree Mix Negative    Mold 1 Negative    Mold 2 Negative    Mold 3 Negative    Mold 4 Negative    Mite Mix Negative    Cat 2+    Dog Negative    Cockroach Negative          Previous notes and tests were reviewed. The plan was reviewed with the patient/family, and all questions/concerned  were addressed.  It was my pleasure to see Megan [name] today and participate in her care. Please feel free to contact me with any questions or concerns.  Sincerely,  Orlan Cramp, DO Allergy & Immunology  [facility]  [place] office: [phone] [facility] office: [phone]

## 2023-12-03 ENCOUNTER — Ambulatory Visit: Admitting: Allergy

## 2023-12-03 ENCOUNTER — Encounter: Payer: Self-pay | Admitting: Allergy

## 2023-12-03 DIAGNOSIS — J3089 Other allergic rhinitis: Secondary | ICD-10-CM

## 2023-12-03 DIAGNOSIS — R053 Chronic cough: Secondary | ICD-10-CM

## 2023-12-03 DIAGNOSIS — Z87891 Personal history of nicotine dependence: Secondary | ICD-10-CM

## 2023-12-03 DIAGNOSIS — R12 Heartburn: Secondary | ICD-10-CM

## 2023-12-03 NOTE — Patient Instructions (Addendum)
 12/03/2023 skin testing positive to grass, ragweed, one tree pollen, cat.  Negative to common foods.   Results given.  Environmental allergies Start environmental control measures as below. Use over the counter antihistamines such as Zyrtec (cetirizine), Claritin  (loratadine ), Allegra (fexofenadine), or Xyzal (levocetirizine) daily as needed.  May switch antihistamines every few months.  Chronic coughing The most common causes of chronic cough include the following: upper airway cough syndrome (UACS) which is caused by variety of rhinitis conditions; asthma; gastroesophageal reflux disease (GERD); chronic bronchitis from cigarette smoking or other inhaled environmental irritants; non-asthmatic eosinophilic bronchitis; and bronchiectasis.  In prospective studies, these conditions have accounted for up to 94% of the causes of chronic cough in immunocompetent adults.  Please call ENT. Greetings! We have been trying to reach you. You have been referred by your physician to [facility]. Please give us  a call to schedule an appointment at [phone]. We are located at: [address] [place] [address].   Possible reflux Continue lifestyle and dietary modifications. Continue omeprazole  40mg  once day - nothing to eat or drink for 20-30 minutes afterwards.   Follow up in 6 months or sooner if needed.   Reducing Pollen Exposure Pollen seasons: trees (spring), grass (summer) and ragweed/weeds (fall). Keep windows closed in your home and car to lower pollen exposure.  Install air conditioning in the bedroom and throughout the house if possible.  Avoid going out in dry windy days - especially early morning. Pollen counts are highest between 5 - 10 AM and on dry, hot and windy days.  Save outside activities for late afternoon or after a heavy rain, when pollen levels are lower.  Avoid mowing of grass if you have grass pollen allergy. Be aware that pollen can also be  transported indoors on people and pets.  Dry your clothes in an automatic dryer rather than hanging them outside where they might collect pollen.  Rinse hair and eyes before bedtime.  Pet Allergen Avoidance: Contrary to popular opinion, there are no "hypoallergenic" breeds of dogs or cats. That is because people are not allergic to an animal's hair, but to an allergen found in the animal's saliva, dander (dead skin flakes) or urine. Pet allergy symptoms typically occur within minutes. For some people, symptoms can build up and become most severe 8 to 12 hours after contact with the animal. People with severe allergies can experience reactions in public places if dander has been transported on the pet owners' clothing. Keeping an animal outdoors is only a partial solution, since homes with pets in the yard still have higher concentrations of animal allergens. Before getting a pet, ask your allergist to determine if you are allergic to animals. If your pet is already considered part of your family, try to minimize contact and keep the pet out of the bedroom and other rooms where you spend a great deal of time. As with dust mites, vacuum carpets often or replace carpet with a hardwood floor, tile or linoleum. High-efficiency particulate air (HEPA) cleaners can reduce allergen levels over time. While dander and saliva are the source of cat and dog allergens, urine is the source of allergens from rabbits, hamsters, mice and guinea pigs; so ask a non-allergic family member to clean the animal's cage. If you have a pet allergy, talk to your allergist about the potential for allergy immunotherapy (allergy shots). This strategy can often provide long-term relief.

## 2023-12-06 ENCOUNTER — Telehealth: Payer: Self-pay | Admitting: *Deleted

## 2023-12-06 NOTE — Telephone Encounter (Signed)
 Copied from CRM #[id number]. Topic: Clinical - Medical Advice >> Dec 06, 2023  3:24 PM Kevelyn M wrote: Reason for CRM: [facility] scheduling for radiology. Patient needs another form  filled out for an MRI sedation. (HMP forms). Patient was sick and she missed her appointment. The other form that was filled out is from October 22nd. The form has to be filled out within 30 days because her next appointment is December 27, 2023. She's requesting for the appointment to get another appointment with her provider.  Call back # [phone]

## 2023-12-07 NOTE — Telephone Encounter (Signed)
 Spoke with the patient and scheduled an appt on 12/1.

## 2023-12-07 NOTE — Telephone Encounter (Signed)
 Ok to schedule patient for the H and P

## 2023-12-11 ENCOUNTER — Other Ambulatory Visit: Payer: Self-pay | Admitting: Allergy

## 2023-12-17 ENCOUNTER — Encounter: Payer: Self-pay | Admitting: Family Medicine

## 2023-12-17 ENCOUNTER — Ambulatory Visit: Admitting: Family Medicine

## 2023-12-17 VITALS — BP 120/60 | HR 77 | Temp 98.2°F | Ht 65.0 in | Wt 187.7 lb

## 2023-12-17 DIAGNOSIS — Z01818 Encounter for other preprocedural examination: Secondary | ICD-10-CM | POA: Diagnosis not present

## 2023-12-17 DIAGNOSIS — E119 Type 2 diabetes mellitus without complications: Secondary | ICD-10-CM | POA: Diagnosis not present

## 2023-12-17 LAB — POCT GLYCOSYLATED HEMOGLOBIN (HGB A1C): Hemoglobin A1C: 7 % — AB (ref 4.0–5.6)

## 2023-12-17 NOTE — Progress Notes (Unsigned)
 Established Patient Office Visit  Subjective   Patient ID: Megan [name], female    DOB: May 26, 1955  Age: 68 y.o. MRN: [id number]  Chief Complaint  Patient presents with   Pre-op Exam    Patient presents for repeat H&P prior to MRI with sedation as last one was cancelled due to URI    HPI Discussed the use of AI scribe software for clinical note transcription with the patient, who gave verbal consent to proceed.  History of Present Illness   Megan [name] is a 68 year old female who presents for a pre-sedation history and physical for an upcoming MRI.  She requires sedation for the MRI due to claustrophobia. She has thyroid  issues, including a nodule or goiter, and has had previous thyroid  ultrasounds. She continues to follow up with Endo every 6 months and last thyroid  US  was in 2020 and was stable in size.  Diabetes -- patient recently had a video visit with her specialist but has not had a recently A1C-- today's A1C is 7.0 which is well controlled. We reviewed her medications which have not changed since last month.   She experiences head pain, attributed to benign lesions, prompting the MRI.   Her surgical history includes four hernia repairs, hemorrhoid surgery, and foot surgery. She has tolerated sedation well in past procedures, including colonoscopy, with no anesthesia complications. She has no history of sleep apnea and does not use a CPAP machine. An overnight study showed no sleep apnea. She experiences a twitch without a definitive diagnosis and is compliant with her medications.     There have been no changes to her overall health since the last visit. The MRI was rescheduled to December 11. I reviewed her past medical, surgical and family history.  Current Outpatient Medications  Medication Instructions   amLODipine  (NORVASC ) 10 MG tablet TAKE 1 TABLET(10 MG) BY MOUTH DAILY   b complex vitamins tablet 1 tablet, Daily   Cholecalciferol (VITAMIN D -3 PO) 2,000  Units, Daily   Fish Oil-Cholecalciferol (FISH OIL + D3 PO) 1,200 mg, Daily   fluticasone  (FLONASE ) 50 MCG/ACT nasal spray SHAKE LIQUID AND USE 2 SPRAYS IN EACH NOSTRIL DAILY   glimepiride  (AMARYL ) 1 mg, Oral, Daily with breakfast   guaiFENesin -codeine  100-10 MG/5ML syrup 5-10 mLs, Oral, 3 times daily PRN   losartan  (COZAAR ) 100 mg, Oral, Daily   Multiple Vitamin (MULTIVITAMIN) tablet 1 tablet, Daily   omeprazole  (PRILOSEC) 40 MG capsule TAKE 1 CAPSULE(40 MG) BY MOUTH DAILY   Safety Seal Miscellaneous MISC 1 Application, Topical, Every morning, Medication Name: Hormonic Hair Solution (Minoxidil 8% and Finasteride 0.05%)   sitaGLIPtin -metformin  (JANUMET ) 50-1000 MG tablet 1 tablet, Oral, Daily   tretinoin  (RETIN-A ) 0.025 % cream Topical, Daily at bedtime, Apply pea size amount to affected areas of face and neck at bedtime 2 nights per week x 2 weeks then increase to 3 nights per week if tolerated.    Patient Active Problem List   Diagnosis Date Noted   Atherosclerosis of artery of both lower extremities 07/30/2023   Atypical chest pain 01/19/2022   DDD (degenerative disc disease), lumbar 08/10/2021   Right knee pain 06/17/2021   Low back pain with radiation 12/21/2020   Preventative health care 12/21/2020   At risk for cardiovascular event 11/23/2020   Type 2 diabetes mellitus without complication, without long-term current use of insulin (HCC) 11/23/2020   Multinodular goiter 11/23/2020   Generalized abdominal pain 02/19/2020   History of hernia repair 02/19/2020  Hair loss 02/17/2020   Controlled diabetes mellitus type 2 with complications (HCC) 02/17/2020   Diabetes mellitus without complication (HCC) 11/11/2019   Degenerative lumbar spinal stenosis 09/04/2019   Degenerative spondylolisthesis 09/04/2019   Neck pain 09/04/2019   Type 2 diabetes mellitus with hyperglycemia, without long-term current use of insulin (HCC) 09/02/2019   Dyslipidemia 09/02/2019   Uncontrolled type 2  diabetes mellitus with hyperglycemia (HCC) 11/05/2018   Fatigue 11/05/2018   Acute non-recurrent pansinusitis 04/25/2018   Bronchitis 05/21/2017   CAP (community acquired pneumonia) 10/15/2013   Wheezing 09/29/2013   Pain, knee 10/02/2012   Gastrocnemius muscle strain 10/02/2012   Wrist sprain 10/02/2012   Thyroid  nodule 05/03/2012   Rash and nonspecific skin eruption 03/14/2012   Alopecia 03/28/2010   History of colonic polyps 11/09/2009   HEMANGIOMA, HEPATIC 11/04/2009   UNSPECIFIED ANEMIA 11/04/2009   DIVERTICULOSIS, COLON 11/04/2009   FATTY LIVER DISEASE 11/04/2009   PALPITATIONS 09/06/2009   Chronic fatigue 08/07/2008   Allergic rhinitis 04/10/2007   Diabetes mellitus type II, controlled (HCC) 12/21/2006   Hyperlipidemia 04/12/2006   HTN (hypertension) 04/12/2006     Review of Systems  All other systems reviewed and are negative.     Objective:     BP 120/60   Pulse 77   Temp 98.2 F (36.8 C) (Oral)   Ht 5' 5 (1.651 m)   Wt 187 lb 11.2 oz (85.1 kg)   SpO2 98%   BMI 31.23 kg/m    Physical Exam Vitals reviewed.  Constitutional:      Appearance: Normal appearance. She is well-groomed and normal weight.  Neck:     Thyroid : No thyromegaly (sligh prominence in the center).  Cardiovascular:     Rate and Rhythm: Normal rate and regular rhythm.     Pulses: Normal pulses.     Heart sounds: S1 normal and S2 normal.  Pulmonary:     Effort: Pulmonary effort is normal.     Breath sounds: Normal breath sounds and air entry.  Musculoskeletal:     Right lower leg: No edema.     Left lower leg: No edema.  Neurological:     Mental Status: She is alert and oriented to person, place, and time. Mental status is at baseline.     Gait: Gait is intact.  Psychiatric:        Mood and Affect: Mood and affect normal.        Speech: Speech normal.        Behavior: Behavior normal.        Judgment: Judgment normal.      Results for orders placed or performed in visit on  12/17/23  POC HgB A1c  Result Value Ref Range   Hemoglobin A1C 7.0 (A) 4.0 - 5.6 %   HbA1c POC (<> result, manual entry)     HbA1c, POC (prediabetic range)     HbA1c, POC (controlled diabetic range)        The 10-year ASCVD risk score (Arnett DK, et al., 2019) is: 19.9%    Assessment & Plan:  Encounter for preoperative assessment  Diabetes mellitus type II, controlled (HCC) -     POCT glycosylated hemoglobin (Hb A1C)  Normal physical exam findings except for prominence of the thyroid  gland, continuing to be followed by endo. She does not require any further testing prior to undergoing anesthesia for the MRI. Her diabetes is well controlled with an A1C of 7.0, no further work up is needed prior to her MRI  under sedation. Will send new form to the imaging center.   No follow-ups on file.    Heron CHRISTELLA Sharper, MD

## 2023-12-20 ENCOUNTER — Encounter: Payer: Self-pay | Admitting: Dermatology

## 2023-12-20 ENCOUNTER — Ambulatory Visit: Admitting: Dermatology

## 2023-12-20 VITALS — BP 159/81

## 2023-12-20 DIAGNOSIS — L649 Androgenic alopecia, unspecified: Secondary | ICD-10-CM | POA: Diagnosis not present

## 2023-12-20 DIAGNOSIS — L720 Epidermal cyst: Secondary | ICD-10-CM | POA: Diagnosis not present

## 2023-12-20 MED ORDER — SAFETY SEAL MISCELLANEOUS MISC: 1.0000 | Freq: Every morning | 11 refills | Status: AC

## 2023-12-20 NOTE — Progress Notes (Signed)
   Follow-Up Visit   Subjective  Megan [name] is a 68 y.o. female established patient who presents for FOLLOW UP on the diagnoses listed below:  Patient was last evaluated on 07/30/23.   AGA: Pt stated that she did not receive [employer] minoxidil/finasteride topical. She did not purchase Viviscal & Vital Proteins.  Milia: Pt stated that she has been using a SA wash QAM and applying Avene retinol nightly. She likes the products but stated she can not see a difference.    The following portions of the chart were reviewed this encounter and updated as appropriate: medications, allergies, medical history  Review of Systems:  No other skin or systemic complaints except as noted in HPI or Assessment and Plan.  Objective  Well appearing patient in no apparent distress; mood and affect are within normal limits.   A focused examination was performed of the following areas: scalp   Relevant exam findings are noted in the Assessment and Plan.                    Assessment & Plan    Androgenic alopecia Significant hair thinning, particularly on the temples and scalp. Hair loss is ongoing without treatment. She has not started prescribed topical drops due to communication issues with the pharmacy. Emphasized the importance of starting treatment to prevent further hair loss and promote regrowth. Explained that minoxidil and finasteride will stop hair from shrinking and shedding and promote new growth, with visible results in 4-6 months. Discussed potential for unwanted hair growth if applied at night. - Resent prescription for minoxidil and finasteride compound to [facility]. - Instructed to apply the compound every morning to prevent unwanted hair growth on the face. - Advised to contact [facility] if no communication is received. - Scheduled follow-up in six months.  Milia Confirmed by biopsy. Current treatment with retinol slows the growth of new milia but does not remove existing  ones. Discussed cosmetic procedure options for removal, noting that insurance does not cover it. Explained that electro desiccation is a tedious procedure with a cost starting at $200. - Continue retinol application nightly. - Consider cosmetic procedure for milia removal if desired. ANDROGENIC ALOPECIA   Related Medications Safety Seal Miscellaneous MISC Apply 1 Application topically in the morning. Medication Name: Hormonic Hair Solution (Minoxidil 8% and Finasteride 0.05%)  Return for ANDROGENETIC ALOPECIA.   Documentation: I have reviewed the above documentation for accuracy and completeness, and I agree with the above.  I, Shirron Maranda, CMA II, am acting as scribe for:  Delon Lenis, DO

## 2023-12-20 NOTE — Patient Instructions (Addendum)
 VISIT SUMMARY:  Today, we discussed your ongoing hair loss and treatment for androgenetic alopecia, as well as your concerns about milia. We reviewed your current treatments and made some adjustments to your plan to help manage these issues more effectively.  YOUR PLAN:  -ANDROGENETIC ALOPECIA:  Androgenetic alopecia is a common form of hair loss that occurs due to genetic and hormonal factors. You have significant hair thinning, especially at the temples and scalp.   It is important to start the prescribed minoxidil and finasteride  compounded drops to prevent further hair loss and promote regrowth.   This medications will help stop your hair from shrinking and shedding, and you should see visible results in 4-6 months.   Apply the compound every morning to avoid unwanted hair growth on your face. If you do not hear from [facility] about your prescription, please contact them. We will follow up in six months to assess your progress.  -MILIA:  Milia are small, white cysts that can appear on the skin. Your biopsy confirmed the presence of milia, and while retinol can slow the growth of new milia, it does not remove existing ones.   We discussed cosmetic procedures for removal, such as electro desiccation, which is not covered by insurance and starts at $200. Continue applying retinol nightly, and consider a cosmetic procedure if you wish to remove the milia.  INSTRUCTIONS:  Please start using the minoxidil and finasteride compound every morning and contact [facility] if you do not hear from them about your prescription. Continue applying retinol nightly for milia. We will have a follow-up appointment in six months to review your progress.       Cosmetic Removal Quote:  $200 to remove up to 15 lesions $300 to remove 16 to 25 lesions $400 to removal 26 to 35 lesions $10 per each additional lesion over 35  ** $100 deposit required to schedule **   Important Information  Due to  recent changes in healthcare laws, you may see results of your pathology and/or laboratory studies on MyChart before the doctors have had a chance to review them. We understand that in some cases there may be results that are confusing or concerning to you. Please understand that not all results are received at the same time and often the doctors may need to interpret multiple results in order to provide you with the best plan of care or course of treatment. Therefore, we ask that you please give us  2 business days to thoroughly review all your results before contacting the office for clarification. Should we see a critical lab result, you will be contacted sooner.   If You Need Anything After Your Visit  If you have any questions or concerns for your doctor, please call our main line at [phone] If no one answers, please leave a voicemail as directed and we will return your call as soon as possible. Messages left after 4 pm will be answered the following business day.   You may also send us  a message via MyChart. We typically respond to MyChart messages within 1-2 business days.  For prescription refills, please ask your pharmacy to contact our office. Our fax number is [phone].  If you have an urgent issue when the clinic is closed that cannot wait until the next business day, you can page your doctor at the number below.    Please note that while we do our best to be available for urgent issues outside of office hours, we are  not available 24/7.   If you have an urgent issue and are unable to reach us , you may choose to seek medical care at your doctor's office, retail clinic, urgent care center, or emergency room.  If you have a medical emergency, please immediately call 911 or go to the emergency department. In the event of inclement weather, please call our main line at [phone] for an update on the status of any delays or closures.  Dermatology Medication Tips: Please keep  the boxes that topical medications come in in order to help keep track of the instructions about where and how to use these. Pharmacies typically print the medication instructions only on the boxes and not directly on the medication tubes.   If your medication is too expensive, please contact our office at [phone] or send us  a message through MyChart.   We are unable to tell what your co-pay for medications will be in advance as this is different depending on your insurance coverage. However, we may be able to find a substitute medication at lower cost or fill out paperwork to get insurance to cover a needed medication.   If a prior authorization is required to get your medication covered by your insurance company, please allow us  1-2 business days to complete this process.  Drug prices often vary depending on where the prescription is filled and some pharmacies may offer cheaper prices.  The website www.goodrx.com contains coupons for medications through different pharmacies. The prices here do not account for what the cost may be with help from insurance (it may be cheaper with your insurance), but the website can give you the price if you did not use any insurance.  - You can print the associated coupon and take it with your prescription to the pharmacy.  - You may also stop by our office during regular business hours and pick up a GoodRx coupon card.  - If you need your prescription sent electronically to a different pharmacy, notify our office through [facility] or by phone at [phone]

## 2023-12-26 ENCOUNTER — Other Ambulatory Visit: Payer: Self-pay

## 2023-12-26 ENCOUNTER — Encounter (HOSPITAL_COMMUNITY): Payer: Self-pay | Admitting: *Deleted

## 2023-12-26 NOTE — Progress Notes (Signed)
 SDW CALL  Patient was given pre-op instructions over the phone. The opportunity was given for the patient to ask questions. No further questions asked. Patient verbalized understanding of instructions given.   PCP - Heron Ozell COME Cardiologist - denies Endocrinologist - Shamleffer, Donell Cardinal, MD   PPM/ICD - denies Device Orders -  Rep Notified -   Chest x-ray - na EKG - DOS Stress Test - denies ECHO - denies Cardiac Cath - denies  Sleep Study - years ago in [place] . Did not have sleep apnea. CPAP - no  Fasting Blood Sugar - 145 Checks Blood Sugar every am  Blood Thinner Instructions:na Aspirin Instructions:na  ERAS Protcol -clears until 0700 PRE-SURGERY Ensure or G2-   COVID TEST- na   Anesthesia review: yes- please review H&P from 12/17/23 -office visit with Dr. Heron Ozell. Does it meet criteria,especially ROS?  Patient denies shortness of breath, fever, cough and chest pain over the phone call. She states that her last cold symptoms were two weeks ago.  Oral Hygiene is also important to reduce your risk of infection.  Remember - BRUSH YOUR TEETH THE MORNING OF SURGERY WITH YOUR REGULAR TOOTHPASTE

## 2023-12-26 NOTE — Anesthesia Preprocedure Evaluation (Addendum)
 Anesthesia Evaluation  Patient identified by MRN, date of birth, ID band Patient awake    Reviewed: Allergy  & Precautions, NPO status , Patient's Chart, lab work & pertinent test results  History of Anesthesia Complications Negative for: history of anesthetic complications  Airway Mallampati: III  TM Distance: >3 FB Neck ROM: Full   Comment: Previous grade I view with MAC 4 Dental  (+) Dental Advisory Given Bridge at the top:   Pulmonary neg pulmonary ROS, neg shortness of breath, neg sleep apnea, neg COPD, neg recent URI, former smoker   Pulmonary exam normal breath sounds clear to auscultation       Cardiovascular hypertension (amlodipine , losartan ), Pt. on medications (-) angina + CAD (mild, non-obstructive) and + Peripheral Vascular Disease  (-) Past MI, (-) Cardiac Stents and (-) CABG (-) dysrhythmias  Rhythm:Regular Rate:Normal  HLD   Neuro/Psych  Headaches, neg Seizures    GI/Hepatic hiatal hernia,GERD  Medicated,,Fatty liver disease Diverticulosis    Endo/Other  diabetes, Type 2, Oral Hypoglycemic Agents  Multinodular goiter  Renal/GU negative Renal ROS     Musculoskeletal  (+) Arthritis ,    Abdominal   Peds  Hematology negative hematology ROS (+)   Anesthesia Other Findings   Reproductive/Obstetrics                              Anesthesia Physical Anesthesia Plan  ASA: 2  Anesthesia Plan: General   Post-op Pain Management: Minimal or no pain anticipated   Induction: Intravenous  PONV Risk Score and Plan: 3 and Ondansetron , Dexamethasone and Treatment may vary due to age or medical condition  Airway Management Planned: Oral ETT  Additional Equipment:   Intra-op Plan:   Post-operative Plan: Extubation in OR  Informed Consent: I have reviewed the patients History and Physical, chart, labs and discussed the procedure including the risks, benefits and alternatives for  the proposed anesthesia with the patient or authorized representative who has indicated his/her understanding and acceptance.     Dental advisory given  Plan Discussed with: CRNA and Anesthesiologist  Anesthesia Plan Comments: (Rescheduled from 11/29/2023 due to URI. H&P done on 12/17/2023 by [name], MD. A1c 7.0%.   Risks of general anesthesia discussed including, but not limited to, sore throat, hoarse voice, chipped/damaged teeth, injury to vocal cords, nausea and vomiting, allergic reactions, lung infection, heart attack, stroke, and death. All questions answered.  )         Anesthesia Quick Evaluation

## 2023-12-27 ENCOUNTER — Ambulatory Visit (HOSPITAL_COMMUNITY)

## 2023-12-27 ENCOUNTER — Ambulatory Visit (HOSPITAL_COMMUNITY)
Admission: RE | Admit: 2023-12-27 | Discharge: 2023-12-27 | Disposition: A | Attending: Neurosurgery | Admitting: Neurosurgery

## 2023-12-27 ENCOUNTER — Ambulatory Visit (HOSPITAL_COMMUNITY): Admitting: Vascular Surgery

## 2023-12-27 ENCOUNTER — Encounter (HOSPITAL_COMMUNITY): Payer: Self-pay | Admitting: Neurosurgery

## 2023-12-27 ENCOUNTER — Encounter: Admission: RE | Disposition: A | Payer: Self-pay | Source: Home / Self Care | Attending: Neurosurgery

## 2023-12-27 DIAGNOSIS — D320 Benign neoplasm of cerebral meninges: Secondary | ICD-10-CM | POA: Diagnosis not present

## 2023-12-27 DIAGNOSIS — E119 Type 2 diabetes mellitus without complications: Secondary | ICD-10-CM | POA: Diagnosis not present

## 2023-12-27 DIAGNOSIS — D329 Benign neoplasm of meninges, unspecified: Secondary | ICD-10-CM

## 2023-12-27 DIAGNOSIS — G43909 Migraine, unspecified, not intractable, without status migrainosus: Secondary | ICD-10-CM | POA: Diagnosis not present

## 2023-12-27 HISTORY — PX: RADIOLOGY WITH ANESTHESIA: SHX6223

## 2023-12-27 LAB — POCT I-STAT, CHEM 8
BUN: 8 mg/dL (ref 8–23)
Calcium, Ion: 1.14 mmol/L — ABNORMAL LOW (ref 1.15–1.40)
Chloride: 106 mmol/L (ref 98–111)
Creatinine, Ser: 0.8 mg/dL (ref 0.44–1.00)
Glucose, Bld: 137 mg/dL — ABNORMAL HIGH (ref 70–99)
HCT: 38 % (ref 36.0–46.0)
Hemoglobin: 12.9 g/dL (ref 12.0–15.0)
Potassium: 3.7 mmol/L (ref 3.5–5.1)
Sodium: 143 mmol/L (ref 135–145)
TCO2: 24 mmol/L (ref 22–32)

## 2023-12-27 LAB — GLUCOSE, CAPILLARY
Glucose-Capillary: 149 mg/dL — ABNORMAL HIGH (ref 70–99)
Glucose-Capillary: 133 mg/dL — ABNORMAL HIGH (ref 70–99)

## 2023-12-27 SURGERY — MRI WITH ANESTHESIA
Anesthesia: General

## 2023-12-27 MED ORDER — PROPOFOL 10 MG/ML IV BOLUS
INTRAVENOUS | Status: DC | PRN
  Administered 2023-12-27: 150 mg via INTRAVENOUS

## 2023-12-27 MED ORDER — MIDAZOLAM HCL (PF) 2 MG/2ML IJ SOLN
INTRAMUSCULAR | Status: DC | PRN
  Administered 2023-12-27: 1 mg via INTRAVENOUS

## 2023-12-27 MED ORDER — AMISULPRIDE (ANTIEMETIC) 5 MG/2ML IV SOLN: 10.0000 mg | Freq: Once | INTRAVENOUS | Status: DC | PRN

## 2023-12-27 MED ORDER — GADOBUTROL 1 MMOL/ML IV SOLN
8.0000 mL | Freq: Once | INTRAVENOUS | Status: AC | PRN
  Administered 2023-12-27: 8 mL via INTRAVENOUS

## 2023-12-27 MED ORDER — INSULIN ASPART 100 UNIT/ML IJ SOLN: 0.0000 [IU] | INTRAMUSCULAR | Status: DC | PRN

## 2023-12-27 MED ORDER — SUGAMMADEX SODIUM 200 MG/2ML IV SOLN
INTRAVENOUS | Status: DC | PRN
  Administered 2023-12-27: 200 mg via INTRAVENOUS

## 2023-12-27 MED ORDER — CHLORHEXIDINE GLUCONATE 0.12 % MT SOLN
15.0000 mL | Freq: Once | OROMUCOSAL | Status: AC
  Administered 2023-12-27: 15 mL via OROMUCOSAL
  Filled 2023-12-27: qty 15

## 2023-12-27 MED ORDER — MIDAZOLAM HCL 2 MG/2ML IJ SOLN
INTRAMUSCULAR | Status: AC
  Filled 2023-12-27: qty 2

## 2023-12-27 MED ORDER — LACTATED RINGERS IV SOLN: INTRAVENOUS | Status: DC

## 2023-12-27 MED ORDER — ACETAMINOPHEN 10 MG/ML IV SOLN: 1000.0000 mg | Freq: Once | INTRAVENOUS | Status: DC | PRN

## 2023-12-27 MED ORDER — PHENYLEPHRINE 80 MCG/ML (10ML) SYRINGE FOR IV PUSH (FOR BLOOD PRESSURE SUPPORT)
PREFILLED_SYRINGE | INTRAVENOUS | Status: DC | PRN
  Administered 2023-12-27: 160 ug via INTRAVENOUS

## 2023-12-27 MED ORDER — ORAL CARE MOUTH RINSE: 15.0000 mL | Freq: Once | OROMUCOSAL | Status: AC

## 2023-12-27 MED ORDER — LIDOCAINE 2% (20 MG/ML) 5 ML SYRINGE
INTRAMUSCULAR | Status: DC | PRN
  Administered 2023-12-27: 60 mg via INTRAVENOUS

## 2023-12-27 MED ORDER — ROCURONIUM BROMIDE 10 MG/ML (PF) SYRINGE
PREFILLED_SYRINGE | INTRAVENOUS | Status: DC | PRN
  Administered 2023-12-27: 50 mg via INTRAVENOUS

## 2023-12-27 MED ORDER — CHLORHEXIDINE GLUCONATE 0.12 % MT SOLN: 15.0000 mL | Freq: Once | OROMUCOSAL | Status: DC

## 2023-12-27 MED ORDER — FENTANYL CITRATE (PF) 100 MCG/2ML IJ SOLN
INTRAMUSCULAR | Status: AC
  Filled 2023-12-27: qty 2

## 2023-12-27 MED ORDER — FENTANYL CITRATE (PF) 250 MCG/5ML IJ SOLN
INTRAMUSCULAR | Status: DC | PRN
  Administered 2023-12-27: 50 ug via INTRAVENOUS

## 2023-12-27 NOTE — Transfer of Care (Signed)
 Immediate Anesthesia Transfer of Care Note  Patient: STEPHANNE GREELEY  Procedure(s) Performed: MRI WITH ANESTHESIA  Patient Location: PACU  Anesthesia Type:General  Level of Consciousness: awake, alert , and oriented  Airway & Oxygen Therapy: Patient Spontanous Breathing and Patient connected to face mask oxygen  Post-op Assessment: Report given to RN and Post -op Vital signs reviewed and stable  Post vital signs: Reviewed and stable  Last Vitals:  Vitals Value Taken Time  BP 142/73 12/27/23 09:45  Temp 36.3 C 12/27/23 09:45  Pulse 88 12/27/23 09:50  Resp 11 12/27/23 09:50  SpO2 97 % 12/27/23 09:50  Vitals shown include unfiled device data.  Last Pain:  Vitals:   12/27/23 0945  TempSrc:   PainSc: 0-No pain         Complications: No notable events documented.

## 2023-12-27 NOTE — Anesthesia Procedure Notes (Signed)
 Procedure Name: Intubation Date/Time: 12/27/2023 8:43 AM  Performed by: Vera Rochele PARAS, CRNAPre-anesthesia Checklist: Patient identified, Emergency Drugs available, Suction available and Patient being monitored Patient Re-evaluated:Patient Re-evaluated prior to induction Oxygen Delivery Method: Circle System Utilized Preoxygenation: Pre-oxygenation with 100% oxygen Induction Type: IV induction Ventilation: Mask ventilation without difficulty and Oral airway inserted - appropriate to patient size Laryngoscope Size: Miller and 3 Grade View: Grade I Tube type: Oral Tube size: 7.5 mm Number of attempts: 1 Airway Equipment and Method: Stylet and Oral airway Placement Confirmation: ETT inserted through vocal cords under direct vision, positive ETCO2 and breath sounds checked- equal and bilateral Secured at: 21 cm Tube secured with: Tape Dental Injury: Teeth and Oropharynx as per pre-operative assessment

## 2023-12-27 NOTE — Anesthesia Postprocedure Evaluation (Signed)
 Anesthesia Post Note  Patient: Megan [name]  Procedure(s) Performed: MRI WITH ANESTHESIA     Patient location during evaluation: PACU Anesthesia Type: General Level of consciousness: awake Pain management: pain level controlled Vital Signs Assessment: post-procedure vital signs reviewed and stable Respiratory status: spontaneous breathing, nonlabored ventilation and respiratory function stable Cardiovascular status: blood pressure returned to baseline and stable Postop Assessment: no apparent nausea or vomiting Anesthetic complications: no Comments: Pinched top lip from intubation   No notable events documented.  Last Vitals:  Vitals:   12/27/23 1000 12/27/23 1015  BP: 130/60 126/65  Pulse: 86 87  Resp: 18 19  Temp:    SpO2: 98% 98%    Last Pain:  Vitals:   12/27/23 0945  TempSrc:   PainSc: 0-No pain                 Delon Aisha Arch

## 2023-12-28 ENCOUNTER — Encounter (HOSPITAL_COMMUNITY): Payer: Self-pay | Admitting: Radiology

## 2023-12-30 ENCOUNTER — Encounter (HOSPITAL_COMMUNITY): Payer: Self-pay

## 2023-12-30 ENCOUNTER — Ambulatory Visit (HOSPITAL_COMMUNITY)
Admission: EM | Admit: 2023-12-30 | Discharge: 2023-12-30 | Disposition: A | Discharge status: Home / Self Care | Attending: Physician Assistant | Admitting: Physician Assistant

## 2023-12-30 DIAGNOSIS — H1133 Conjunctival hemorrhage, bilateral: Secondary | ICD-10-CM | POA: Diagnosis not present

## 2023-12-30 NOTE — ED Provider Notes (Signed)
 [facility]    CSN: [id number] Arrival date & time: 12/30/23  1417      History   Chief Complaint Chief Complaint  Patient presents with   Eye Problem   Hypertension    HPI Megan [name] is a 68 y.o. female.   Patient presents today with a 1 day history of bilateral eye redness.  She is concerned because her blood pressure was initially elevated when she checked it this morning at around 180/80 and then she went to a local pharmacy where it remained high.  She does have a history of hypertension and reports compliance with her medication regimen.  She denies any visual disturbance, photophobia, headache, chest pain, shortness of breath.  She has not rubbed her eye and denies any ocular trauma.  She denies history of bleeding disorder and is not chronically anticoagulated.  She does wear glasses but denies any contact lens use.  She has tried Visine which provided minimal improvement of symptoms.  She reports that the only medication changes are what she was given during an MRI with and without contrast for sedation as she had anxiety with the procedure and required associated anesthesia.  She reports that she had ET tube placed during the anesthesia event and afterwards noticed some swelling of her top lip with an associated fissure or abrasion but this has been resolving with ice and application of Vaseline.  She denies any swelling of her throat, shortness of breath, rash, muffled voice.    Past Medical History:  Diagnosis Date   Allergy     Arthritis    fingers   Colon polyp    Diabetes mellitus    type 2   Diverticulosis    GERD (gastroesophageal reflux disease)    occasionally   H/O hiatal hernia    ?   Hemorrhoids    Hyperlipidemia    takes fish oil   Hypertension    Infectious colitis    Recurrent upper respiratory infection (URI)    Ventral hernia     Patient Active Problem List   Diagnosis Date Noted   Atherosclerosis of artery of both lower  extremities 07/30/2023   Atypical chest pain 01/19/2022   DDD (degenerative disc disease), lumbar 08/10/2021   Right knee pain 06/17/2021   Low back pain with radiation 12/21/2020   Preventative health care 12/21/2020   At risk for cardiovascular event 11/23/2020   Type 2 diabetes mellitus without complication, without long-term current use of insulin  (HCC) 11/23/2020   Multinodular goiter 11/23/2020   Generalized abdominal pain 02/19/2020   History of hernia repair 02/19/2020   Hair loss 02/17/2020   Controlled diabetes mellitus type 2 with complications (HCC) 02/17/2020   Diabetes mellitus without complication (HCC) 11/11/2019   Degenerative lumbar spinal stenosis 09/04/2019   Degenerative spondylolisthesis 09/04/2019   Neck pain 09/04/2019   Type 2 diabetes mellitus with hyperglycemia, without long-term current use of insulin  (HCC) 09/02/2019   Dyslipidemia 09/02/2019   Uncontrolled type 2 diabetes mellitus with hyperglycemia (HCC) 11/05/2018   Fatigue 11/05/2018   Acute non-recurrent pansinusitis 04/25/2018   Bronchitis 05/21/2017   CAP (community acquired pneumonia) 10/15/2013   Wheezing 09/29/2013   Pain, knee 10/02/2012   Gastrocnemius muscle strain 10/02/2012   Wrist sprain 10/02/2012   Thyroid  nodule 05/03/2012   Rash and nonspecific skin eruption 03/14/2012   Alopecia 03/28/2010   History of colonic polyps 11/09/2009   HEMANGIOMA, HEPATIC 11/04/2009   UNSPECIFIED ANEMIA 11/04/2009   DIVERTICULOSIS, COLON 11/04/2009  FATTY LIVER DISEASE 11/04/2009   PALPITATIONS 09/06/2009   Chronic fatigue 08/07/2008   Allergic rhinitis 04/10/2007   Diabetes mellitus type II, controlled (HCC) 12/21/2006   Hyperlipidemia 04/12/2006   HTN (hypertension) 04/12/2006    Past Surgical History:  Procedure Laterality Date   colonscopy     DIAGNOSTIC LAPAROSCOPY     FOOT SURGERY Bilateral    hammer toes   HEMORRHOID SURGERY     HERNIA REPAIR  01/20/2011   incisional   INCISIONAL  HERNIA REPAIR  01/20/2011   Procedure: LAPAROSCOPIC INCISIONAL HERNIA;  Surgeon: Redell Alm Faith, DO;  Location: [facility];  Service: General;  Laterality: N/A;  Laparoscopic repair of a recurrent incisional hernia with Mesh   INGUINAL HERNIA REPAIR     x's 3   MRI     RADIOLOGY WITH ANESTHESIA N/A 12/27/2023   Procedure: MRI WITH ANESTHESIA;  Surgeon: Radiologist, Medication, MD;  Location: [facility];  Service: Radiology;  Laterality: N/A;  MRI BRAIN WITH BRAIN WITH AND WITHOUT CONTRAST   VENTRAL HERNIA REPAIR      OB History   No obstetric history on file.      Home Medications    Prior to Admission medications  Medication Sig Start Date End Date Taking? Authorizing Provider  amLODipine  (NORVASC ) 10 MG tablet TAKE 1 TABLET(10 MG) BY MOUTH DAILY 09/28/23  Yes [name], MD  b complex vitamins tablet Take 1 tablet by mouth daily.   Yes [provider]  Cholecalciferol (VITAMIN D -3 PO) Take 2,000 Units by mouth daily.   Yes [provider]  fluticasone  (FLONASE ) 50 MCG/ACT nasal spray SHAKE LIQUID AND USE 2 SPRAYS IN EACH NOSTRIL DAILY Patient taking differently: Place 2 sprays into both nostrils daily as needed for allergies. 10/30/23  Yes [name], MD  glimepiride  (AMARYL ) 1 MG tablet Take 1 tablet (1 mg total) by mouth daily with breakfast. 11/26/23  Yes Shamleffer, Ibtehal Jaralla, MD  guaiFENesin -codeine  100-10 MG/5ML syrup Take 5-10 mLs by mouth 3 (three) times daily as needed for cough. 11/23/23  Yes [name], MD  losartan  (COZAAR ) 100 MG tablet TAKE 1 TABLET BY MOUTH EVERY DAY 08/13/23  Yes [name], MD  Multiple Vitamin (MULTIVITAMIN) tablet Take 1 tablet by mouth daily. OVER 50 VITAMIN   Yes [provider]  Omega-3 Fatty Acids (FISH OIL) 1200 MG CAPS Take 1,200 mg by mouth daily.   Yes [provider]  omeprazole  (PRILOSEC) 40 MG capsule TAKE 1 CAPSULE(40 MG) BY MOUTH DAILY Patient taking differently: Take 40  mg by mouth daily as needed (Heartburn). 12/12/23  Yes Luke Orlan HERO, DO  Safety Seal Miscellaneous MISC Apply 1 Application topically in the morning. Medication Name: Hormonic Hair Solution (Minoxidil 8% and Finasteride 0.05%) 12/20/23  Yes Alm Delon SAILOR, DO  sitaGLIPtin -metformin  (JANUMET ) 50-1000 MG tablet Take 1 tablet by mouth daily at 6 (six) AM. 11/26/23  Yes Shamleffer, Donell Cardinal, MD  tretinoin  (RETIN-A ) 0.025 % cream Apply topically at bedtime. Apply pea size amount to affected areas of face and neck at bedtime 2 nights per week x 2 weeks then increase to 3 nights per week if tolerated. 05/09/23 05/08/24 Yes Alm Delon SAILOR, DO    Family History Family History  Problem Relation Age of Onset   Breast cancer Mother    Diabetes Mother    Dementia Mother    Liver cancer Mother    Heart disease Father        heart attack  Diabetes Brother    Breast cancer Sister    Colon cancer Neg Hx    Stomach cancer Neg Hx     Social History Social History[1]   Allergies   Aspirin   Review of Systems Review of Systems  Constitutional:  Negative for activity change, appetite change, fatigue and fever.  HENT:  Negative for congestion, sore throat, trouble swallowing and voice change.   Eyes:  Positive for redness. Negative for photophobia, pain, discharge, itching and visual disturbance.  Respiratory:  Negative for shortness of breath.   Cardiovascular:  Negative for chest pain.  Neurological:  Negative for dizziness, light-headedness and headaches.     Physical Exam Triage Vital Signs ED Triage Vitals  Encounter Vitals Group     BP 12/30/23 1445 138/77     Girls Systolic BP Percentile --      Girls Diastolic BP Percentile --      Boys Systolic BP Percentile --      Boys Diastolic BP Percentile --      Pulse Rate 12/30/23 1445 74     Resp 12/30/23 1445 17     Temp 12/30/23 1445 98.8 F (37.1 C)     Temp Source 12/30/23 1445 Oral     SpO2 12/30/23 1445 96 %      Weight 12/30/23 1442 187 lb (84.8 kg)     Height 12/30/23 1442 5' 5 (1.651 m)     Head Circumference --      Peak Flow --      Pain Score 12/30/23 1442 0     Pain Loc --      Pain Education --      Exclude from Growth Chart --    No data found.  Updated Vital Signs BP 138/77 (BP Location: Right Arm)   Pulse 74   Temp 98.8 F (37.1 C) (Oral)   Resp 17   Ht 5' 5 (1.651 m)   Wt 187 lb (84.8 kg)   SpO2 96%   BMI 31.12 kg/m   Visual Acuity Right Eye Distance: 20/50 Left Eye Distance: 20/25 Bilateral Distance:    Right Eye Near:   Left Eye Near:    Bilateral Near:     Physical Exam Vitals reviewed.  Constitutional:      General: She is awake. She is not in acute distress.    Appearance: Normal appearance. She is well-developed. She is not ill-appearing.     Comments: Very pleasant female appears stated age in no acute distress sitting comfortably in exam room  HENT:     Head: Normocephalic and atraumatic.     Mouth/Throat:     Lips: Lesions present.     Pharynx: Uvula midline. No oropharyngeal exudate or posterior oropharyngeal erythema.      Comments: Small linear superficial wound noted buccal side of upper lip without bleeding. Eyes:     General: Lids are normal.     Extraocular Movements: Extraocular movements intact.     Conjunctiva/sclera:     Right eye: Hemorrhage present.     Left eye: Hemorrhage present.     Pupils: Pupils are equal, round, and reactive to light.     Comments: Conjunctival hemorrhage noted bilateral lateral eye  Cardiovascular:     Rate and Rhythm: Normal rate and regular rhythm.     Heart sounds: Normal heart sounds, S1 normal and S2 normal. No murmur heard. Pulmonary:     Effort: Pulmonary effort is normal.     Breath sounds:  Normal breath sounds. No wheezing, rhonchi or rales.     Comments: Clear to auscultation bilaterally Psychiatric:        Behavior: Behavior is cooperative.      UC Treatments / Results  Labs (all labs  ordered are listed, but only abnormal results are displayed) Labs Reviewed - No data to display  EKG   Radiology No results found.  Procedures Procedures (including critical care time)  Medications Ordered in UC Medications - No data to display  Initial Impression / Assessment and Plan / UC Course  I have reviewed the triage vital signs and the nursing notes.  Pertinent labs & imaging results that were available during my care of the patient were reviewed by me and considered in my medical decision making (see chart for details).     Patient is well-appearing, afebrile, nontoxic, nontachycardic.  Her blood pressure is appropriate in clinic today.  Benign conjunctival hemorrhage noted bilaterally we discussed that this is generally self-limiting and will resolve within a few weeks.  She denies any additional blood loss but given it was bilateral I did offer basic blood work including CBC, CMP, bleeding studies but patient declined this after we discussed that generally this does not require workup and is considered benign.  Recommend that she monitor this closely and if she has any additional lesions, enlarging lesion, visual disturbance, concerns for blood loss including melena, hematochezia, widespread bruising she should return for reevaluation.  Recommend close follow-up with her primary care.  All questions answered to patient satisfaction and she expressed understanding and agreement to treatment plan.  Final Clinical Impressions(s) / UC Diagnoses   Final diagnoses:  Conjunctival hemorrhage of both eyes     Discharge Instructions      As we discussed, this is not something that we generally worry about.  It will take several weeks for it to go away.  Please follow-up with your providers as scheduled and keep track of the area of blood; if this spreads rapidly or if anything changes and you have vision change, eye pain, other blood loss including widespread bruising, blood in  your stool, dark black stools you need to be seen immediately.  If is not getting better quickly come back and we can think about the blood work that we discussed.     ED Prescriptions   None    PDMP not reviewed this encounter.    [1]  Social History Tobacco Use   Smoking status: Former    Current packs/day: 0.00    Average packs/day: 1 pack/day for 20.0 years (20.0 ttl pk-yrs)    Types: Cigarettes    Start date: 01/05/1976    Quit date: 01/05/1996    Years since quitting: 28.0   Smokeless tobacco: Never  Vaping Use   Vaping status: Never Used  Substance Use Topics   Alcohol use: Yes    Alcohol/week: 4.0 standard drinks of alcohol    Types: 4 Glasses of wine per week    Comment: occasional wine   Drug use: No     Sherrell Rocky POUR, PA-C 12/30/23 1554

## 2023-12-30 NOTE — ED Triage Notes (Signed)
 Pt states that she has bilateral eye redness. X1 day  Pt states that her blood pressure has been running high today.   Pt states that she recently had an MRI on 12/11 and feeling she may be having a reaction from the procedure

## 2023-12-30 NOTE — Discharge Instructions (Signed)
 As we discussed, this is not something that we generally worry about.  It will take several weeks for it to go away.  Please follow-up with your providers as scheduled and keep track of the area of blood; if this spreads rapidly or if anything changes and you have vision change, eye pain, other blood loss including widespread bruising, blood in your stool, dark black stools you need to be seen immediately.  If is not getting better quickly come back and we can think about the blood work that we discussed.

## 2024-01-01 ENCOUNTER — Ambulatory Visit (INDEPENDENT_AMBULATORY_CARE_PROVIDER_SITE_OTHER): Admitting: Otolaryngology

## 2024-01-01 ENCOUNTER — Other Ambulatory Visit (INDEPENDENT_AMBULATORY_CARE_PROVIDER_SITE_OTHER): Payer: Self-pay | Admitting: Otolaryngology

## 2024-01-01 ENCOUNTER — Encounter (INDEPENDENT_AMBULATORY_CARE_PROVIDER_SITE_OTHER): Payer: Self-pay | Admitting: Otolaryngology

## 2024-01-01 VITALS — BP 126/77 | HR 78 | Ht 65.0 in | Wt 181.0 lb

## 2024-01-01 DIAGNOSIS — R059 Cough, unspecified: Secondary | ICD-10-CM

## 2024-01-01 DIAGNOSIS — K219 Gastro-esophageal reflux disease without esophagitis: Secondary | ICD-10-CM

## 2024-01-01 MED ORDER — PANTOPRAZOLE SODIUM 40 MG PO TBEC: 40.0000 mg | DELAYED_RELEASE_TABLET | Freq: Two times a day (BID) | ORAL | 1 refills | Status: AC

## 2024-01-01 NOTE — Progress Notes (Signed)
 Reason for Consult: Cough Referring Physician: Dr. Luke Olivia [name] Megan [name] is an 68 y.o. female.  HPI: History of cough for over a year.  She has had a dry cough that occurs day and night but mostly during the day.  She has had allergy  testing.  She has been on once a day omeprazole .  She does not have any hoarseness.  No dysphagia or odynophagia.  She does have nose congestion and postnasal drip but it is never purulent.  She had an MRI scan of her head for a meningioma and it did not show any significant sinusitis.  She has not had a chest x-ray.  Past Medical History:  Diagnosis Date   Allergy     Arthritis    fingers   Colon polyp    Diabetes mellitus    type 2   Diverticulosis    GERD (gastroesophageal reflux disease)    occasionally   H/O hiatal hernia    ?   Hemorrhoids    Hyperlipidemia    takes fish oil   Hypertension    Infectious colitis    Recurrent upper respiratory infection (URI)    Ventral hernia     Past Surgical History:  Procedure Laterality Date   colonscopy     DIAGNOSTIC LAPAROSCOPY     FOOT SURGERY Bilateral    hammer toes   HEMORRHOID SURGERY     HERNIA REPAIR  01/20/2011   incisional   INCISIONAL HERNIA REPAIR  01/20/2011   Procedure: LAPAROSCOPIC INCISIONAL HERNIA;  Surgeon: Megan Alm Faith, DO;  Location: [facility];  Service: General;  Laterality: N/A;  Laparoscopic repair of a recurrent incisional hernia with Mesh   INGUINAL HERNIA REPAIR     x's 3   MRI     RADIOLOGY WITH ANESTHESIA N/A 12/27/2023   Procedure: MRI WITH ANESTHESIA;  Surgeon: Radiologist, Medication, MD;  Location: [facility];  Service: Radiology;  Laterality: N/A;  MRI BRAIN WITH BRAIN WITH AND WITHOUT CONTRAST   VENTRAL HERNIA REPAIR      Family History  Problem Relation Age of Onset   Breast cancer Mother    Diabetes Mother    Dementia Mother    Liver cancer Mother    Heart disease Father        heart attack   Diabetes Brother    Breast cancer Sister    Colon cancer Neg  Hx    Stomach cancer Neg Hx     Social History:  reports that she quit smoking about 28 years ago. Her smoking use included cigarettes. She started smoking about 48 years ago. She has a 20 pack-year smoking history. She has never used smokeless tobacco. She reports current alcohol use of about 4.0 standard drinks of alcohol per week. She reports that she does not use drugs.  Allergies: Allergies[1]   No results found for this or any previous visit (from the past 48 hours).  No results found.  ROS Blood pressure 126/77, pulse 78, height 5' 5 (1.651 m), weight 181 lb (82.1 kg), SpO2 96%. Physical Exam Constitutional:      Appearance: Normal appearance.  HENT:     Head: Normocephalic and atraumatic.     Right Ear: Tympanic membrane is without lesions and middle ear aerated, ear canal and external ear normal.     Left Ear: Tympanic membrane is without lesions and middle ear aerated, ear canal and external ear normal.     Nose: Nose without deviation of septum.  Turbinates with  mild hypertrophy, No significant swelling or masses.     Oral cavity/oropharynx: Mucous membranes are moist. No lesions or masses    Larynx: normal voice. Mirror attempted without success    Eyes:     Extraocular Movements: Extraocular movements intact.     Conjunctiva/sclera: Conjunctivae normal.     Pupils: Pupils are equal, round, and reactive to light.  Cardiovascular:     Rate and Rhythm: Normal rate.  Pulmonary:     Effort: Pulmonary effort is normal.  Musculoskeletal:     Cervical back: Normal range of motion and neck supple. No rigidity.  Lymphadenopathy:     Cervical: No cervical adenopathy or masses.salivary glands without lesions. .     Salivary glands- no mass or swelling Neurological:     Mental Status: He is alert. CN 2-12 intact. No nystagmus      Assessment/Plan: Chronic cough.  She needs a chest x-ray as the usual workup.  We talked about the etiologies from a head neck perspective  for cough.  Reflux is one of the options and she will be treated with twice daily Protonix .  She already has allergy  testing.  She has no significant sinusitis on MRI scan which I reviewed.  She will follow-up in 1-2 months and if she continues then a fiberoptic scope will again be discussed.  She is very nervous and does not want to proceed with the scope.  We briefly discussed other etiologies such as superior laryngeal nerve neuralgia.  Megan [name] 01/01/2024, 10:06 AM         [1]  Allergies Allergen Reactions   Iodinated Contrast Media Hives   Aspirin Nausea Only

## 2024-01-21 NOTE — Progress Notes (Signed)
 Megan [name]                                          MRN: [id number]   01/21/2024   The [facility] Quality Team Specialist reviewed this patient medical record for the purposes of chart review for care gap closure. The following were reviewed: abstraction for care gap closure-glycemic status assessment.    [facility] Quality Team

## 2024-01-31 ENCOUNTER — Encounter: Payer: Self-pay | Admitting: Family Medicine

## 2024-01-31 ENCOUNTER — Ambulatory Visit (INDEPENDENT_AMBULATORY_CARE_PROVIDER_SITE_OTHER): Admitting: Family Medicine

## 2024-01-31 VITALS — BP 136/64 | HR 88 | Temp 98.6°F | Ht 65.0 in | Wt 188.6 lb

## 2024-01-31 DIAGNOSIS — G245 Blepharospasm: Secondary | ICD-10-CM | POA: Diagnosis not present

## 2024-01-31 NOTE — Progress Notes (Signed)
 "  Established Patient Office Visit   Subjective  Patient ID: Megan [name], female    DOB: 1955/08/13  Age: 69 y.o. MRN: [id number]  Chief Complaint  Patient presents with   Acute Visit    Patient came in today for left eye spasm, started 3 days ago, patient is also having headaches, patient was put under for a MRI a month ago and had a reaction to medication,     Pt is a 69 yo female followed by Dr. Ozell seen for acute concern.  Pt endorses spasm of L upper eye lid x 4 days.  Denies pain in eye, irritation, pruritus, drainage, injury, or changes in vision, n/v, change in balance.  No spasms elsewhere.  Got new glasses 4 months ago.  Notes increased stress due to a relationship and her grand kids staying with her this weeknd.  Poor sleep due to nocturia, up 4 x per night.  Pt mentions prior h/o rxn s/p MRI with anethesia in Nov causing conjunctival hemorrhage.  Unsure if related.  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    Patient Active Problem List   Diagnosis Date Noted   Atherosclerosis of artery of both lower extremities 07/30/2023   Atypical chest pain 01/19/2022   DDD (degenerative disc disease), lumbar 08/10/2021   Right knee pain 06/17/2021   Low back pain with radiation 12/21/2020   Preventative health care 12/21/2020   At risk for cardiovascular event 11/23/2020   Type 2 diabetes mellitus without complication, without long-term current use of insulin  (HCC) 11/23/2020   Multinodular goiter 11/23/2020   Generalized abdominal pain 02/19/2020   History of hernia repair 02/19/2020   Hair loss 02/17/2020   Controlled diabetes mellitus type 2 with complications (HCC) 02/17/2020   Diabetes mellitus without complication (HCC) 11/11/2019   Degenerative lumbar spinal stenosis 09/04/2019   Degenerative spondylolisthesis 09/04/2019   Neck pain 09/04/2019   Type 2 diabetes mellitus with hyperglycemia, without long-term current use of insulin  (HCC)  09/02/2019   Dyslipidemia 09/02/2019   Uncontrolled type 2 diabetes mellitus with hyperglycemia (HCC) 11/05/2018   Fatigue 11/05/2018   Acute non-recurrent pansinusitis 04/25/2018   Bronchitis 05/21/2017   CAP (community acquired pneumonia) 10/15/2013   Wheezing 09/29/2013   Pain, knee 10/02/2012   Gastrocnemius muscle strain 10/02/2012   Wrist sprain 10/02/2012   Thyroid  nodule 05/03/2012   Rash and nonspecific skin eruption 03/14/2012   Alopecia 03/28/2010   History of colonic polyps 11/09/2009   HEMANGIOMA, HEPATIC 11/04/2009   UNSPECIFIED ANEMIA 11/04/2009   DIVERTICULOSIS, COLON 11/04/2009   FATTY LIVER DISEASE 11/04/2009   PALPITATIONS 09/06/2009   Chronic fatigue 08/07/2008   Allergic rhinitis 04/10/2007   Diabetes mellitus type II, controlled (HCC) 12/21/2006   Hyperlipidemia 04/12/2006   HTN (hypertension) 04/12/2006   Past Medical History:  Diagnosis Date   Allergy     Arthritis    fingers   Colon polyp    Diabetes mellitus    type 2   Diverticulosis    GERD (gastroesophageal reflux disease)    occasionally   H/O hiatal hernia    ?   Hemorrhoids    Hyperlipidemia    takes fish oil   Hypertension    Infectious colitis    Recurrent upper respiratory infection (URI)    Ventral hernia    Past Surgical History:  Procedure Laterality Date   colonscopy     DIAGNOSTIC LAPAROSCOPY     FOOT SURGERY Bilateral    hammer  toes   HEMORRHOID SURGERY     HERNIA REPAIR  01/20/2011   incisional   INCISIONAL HERNIA REPAIR  01/20/2011   Procedure: LAPAROSCOPIC INCISIONAL HERNIA;  Surgeon: Redell Alm Faith, DO;  Location: [facility];  Service: General;  Laterality: N/A;  Laparoscopic repair of a recurrent incisional hernia with Mesh   INGUINAL HERNIA REPAIR     x's 3   MRI     RADIOLOGY WITH ANESTHESIA N/A 12/27/2023   Procedure: MRI WITH ANESTHESIA;  Surgeon: Radiologist, Medication, MD;  Location: [facility];  Service: Radiology;  Laterality: N/A;  MRI BRAIN WITH BRAIN  WITH AND WITHOUT CONTRAST   VENTRAL HERNIA REPAIR     Social History[1] Family History  Problem Relation Age of Onset   Breast cancer Mother    Diabetes Mother    Dementia Mother    Liver cancer Mother    Heart disease Father        heart attack   Diabetes Brother    Breast cancer Sister    Colon cancer Neg Hx    Stomach cancer Neg Hx    Allergies[2]  ROS Negative unless stated above    Objective:     BP 136/64 (BP Location: Right Arm, Patient Position: Sitting, Cuff Size: Large)   Pulse 88   Temp 98.6 F (37 C) (Oral)   Ht 5' 5 (1.651 m)   Wt 188 lb 9.6 oz (85.5 kg)   SpO2 99%   BMI 31.38 kg/m  BP Readings from Last 3 Encounters:  01/31/24 136/64  01/01/24 126/77  12/30/23 138/77   Wt Readings from Last 3 Encounters:  01/31/24 188 lb 9.6 oz (85.5 kg)  01/01/24 181 lb (82.1 kg)  12/30/23 187 lb (84.8 kg)      Physical Exam Constitutional:      General: She is not in acute distress.    Appearance: Normal appearance.  HENT:     Head: Normocephalic and atraumatic.     Nose: Nose normal.     Mouth/Throat:     Mouth: Mucous membranes are moist.  Eyes:     General: Lids are normal.     Extraocular Movements:     Right eye: Normal extraocular motion and no nystagmus.     Left eye: Normal extraocular motion and no nystagmus.     Conjunctiva/sclera: Conjunctivae normal.     Right eye: No hemorrhage.    Left eye: No hemorrhage.    Comments: Wearing glasses.  Cardiovascular:     Rate and Rhythm: Normal rate and regular rhythm.     Heart sounds: Normal heart sounds. No murmur heard.    No gallop.  Pulmonary:     Effort: Pulmonary effort is normal. No respiratory distress.     Breath sounds: Normal breath sounds. No wheezing, rhonchi or rales.  Skin:    General: Skin is warm and dry.  Neurological:     Mental Status: She is alert and oriented to person, place, and time.        10/25/2023    2:07 PM 08/20/2023   10:33 AM 12/26/2022    2:16 PM   Depression screen PHQ 2/9  Decreased Interest 0 0 0  Down, Depressed, Hopeless 0 1 0  PHQ - 2 Score 0 1 0  Altered sleeping 1 2 3   Tired, decreased energy 0 0 2  Change in appetite 0 0 0  Feeling bad or failure about yourself  0 1   Trouble concentrating 0 0 1  Moving slowly or fidgety/restless 0 0 1  Suicidal thoughts  0 0  PHQ-9 Score 1  4  7    Difficult doing work/chores   Not difficult at all     Data saved with a previous flowsheet row definition      12/26/2022    2:16 PM  GAD 7 : Generalized Anxiety Score  Nervous, Anxious, on Edge 0  Control/stop worrying 1  Worry too much - different things 1  Trouble relaxing 1  Restless 1  Easily annoyed or irritable 1  Afraid - awful might happen 0  Total GAD 7 Score 5  Anxiety Difficulty Not difficult at all     No results found for any visits on 01/31/24.    Assessment & Plan:   Blepharospasm of left eye   Likely from recent stress and poor sleep.  Discussed ways to reduce stress including setting boundaries.  Consider counseling.  Sleep hygiene.  Reassurance.  Return if symptoms worsen or fail to improve.   Clotilda JONELLE Single, MD     [1]  Social History Tobacco Use   Smoking status: Former    Current packs/day: 0.00    Average packs/day: 1 pack/day for 20.0 years (20.0 ttl pk-yrs)    Types: Cigarettes    Start date: 01/05/1976    Quit date: 01/05/1996    Years since quitting: 28.0   Smokeless tobacco: Never  Vaping Use   Vaping status: Never Used  Substance Use Topics   Alcohol use: Yes    Alcohol/week: 4.0 standard drinks of alcohol    Types: 4 Glasses of wine per week    Comment: occasional wine   Drug use: No  [2]  Allergies Allergen Reactions   Iodinated Contrast Media Hives   Aspirin Nausea Only   "

## 2024-02-06 ENCOUNTER — Encounter (INDEPENDENT_AMBULATORY_CARE_PROVIDER_SITE_OTHER): Payer: Self-pay | Admitting: Otolaryngology

## 2024-02-06 ENCOUNTER — Ambulatory Visit (INDEPENDENT_AMBULATORY_CARE_PROVIDER_SITE_OTHER): Admitting: Otolaryngology

## 2024-02-06 VITALS — BP 147/77 | HR 84 | Wt 188.0 lb

## 2024-02-06 DIAGNOSIS — R059 Cough, unspecified: Secondary | ICD-10-CM

## 2024-02-06 DIAGNOSIS — K219 Gastro-esophageal reflux disease without esophagitis: Secondary | ICD-10-CM | POA: Diagnosis not present

## 2024-02-06 NOTE — Progress Notes (Signed)
 Reason for Consult: Cough Referring Physician: None  Megan [name] is an 69 y.o. female.  HPI: She is not really any better with the cough.  She actually is breaking through with reflux symptoms even on twice daily Protonix .  No new symptoms.  No dysphagia or dyne aphasia or hoarseness.  No sore throat.  Past Medical History:  Diagnosis Date   Allergy     Arthritis    fingers   Colon polyp    Diabetes mellitus    type 2   Diverticulosis    GERD (gastroesophageal reflux disease)    occasionally   H/O hiatal hernia    ?   Hemorrhoids    Hyperlipidemia    takes fish oil   Hypertension    Infectious colitis    Recurrent upper respiratory infection (URI)    Ventral hernia     Past Surgical History:  Procedure Laterality Date   colonscopy     DIAGNOSTIC LAPAROSCOPY     FOOT SURGERY Bilateral    hammer toes   HEMORRHOID SURGERY     HERNIA REPAIR  01/20/2011   incisional   INCISIONAL HERNIA REPAIR  01/20/2011   Procedure: LAPAROSCOPIC INCISIONAL HERNIA;  Surgeon: Redell Alm Faith, DO;  Location: [facility];  Service: General;  Laterality: N/A;  Laparoscopic repair of a recurrent incisional hernia with Mesh   INGUINAL HERNIA REPAIR     x's 3   MRI     RADIOLOGY WITH ANESTHESIA N/A 12/27/2023   Procedure: MRI WITH ANESTHESIA;  Surgeon: Radiologist, Medication, MD;  Location: [facility];  Service: Radiology;  Laterality: N/A;  MRI BRAIN WITH BRAIN WITH AND WITHOUT CONTRAST   VENTRAL HERNIA REPAIR      Family History  Problem Relation Age of Onset   Breast cancer Mother    Diabetes Mother    Dementia Mother    Liver cancer Mother    Heart disease Father        heart attack   Diabetes Brother    Breast cancer Sister    Colon cancer Neg Hx    Stomach cancer Neg Hx     Social History:  reports that she quit smoking about 28 years ago. Her smoking use included cigarettes. She started smoking about 48 years ago. She has a 20 pack-year smoking history. She has never used  smokeless tobacco. She reports current alcohol use of about 4.0 standard drinks of alcohol per week. She reports that she does not use drugs.  Allergies: Allergies[1]   No results found for this or any previous visit (from the past 48 hours).  No results found.  ROS Blood pressure (!) 147/77, pulse 84, weight 188 lb (85.3 kg), SpO2 96%. Physical Exam Constitutional:      Appearance: Normal appearance.  HENT:     Head: Normocephalic and atraumatic.     Right Ear: Tympanic membrane is without lesions and middle ear aerated, ear canal and external ear normal.     Left Ear: Tympanic membrane is without lesions and middle ear aerated, ear canal and external ear normal.     Nose: Nose without deviation of septum.  Turbinates with mild hypertrophy, No significant swelling or masses.     Oral cavity/oropharynx: Mucous membranes are moist. No lesions or masses    Larynx: normal voice. Mirror attempted without success    Eyes:     Extraocular Movements: Extraocular movements intact.     Conjunctiva/sclera: Conjunctivae normal.     Pupils: Pupils are equal,  round, and reactive to light.  Cardiovascular:     Rate and Rhythm: Normal rate.  Pulmonary:     Effort: Pulmonary effort is normal.  Musculoskeletal:     Cervical back: Normal range of motion and neck supple. No rigidity.  Lymphadenopathy:     Cervical: No cervical adenopathy or masses.salivary glands without lesions. .     Salivary glands- no mass or swelling Neurological:     Mental Status: He is alert. CN 2-12 intact. No nystagmus      Assessment/Plan: Cough-I do not really know exactly what is causing this cough at this point.  Seems like reflux is still most likely the primary candidate.  We again discussed a fiberoptic scope and she does not want to do that until after she is seeing the GI doctor which I do agree with.  She will follow-up with me after her GI evaluation.  Megan [name] 02/06/2024, 9:30 AM        [1]   Allergies Allergen Reactions   Cat Dander    Iodinated Contrast Media Hives   Aspirin Nausea Only

## 2024-02-16 ENCOUNTER — Other Ambulatory Visit: Payer: Self-pay | Admitting: Family Medicine

## 2024-02-16 DIAGNOSIS — I10 Essential (primary) hypertension: Secondary | ICD-10-CM

## 2024-02-18 ENCOUNTER — Other Ambulatory Visit: Payer: Self-pay | Admitting: *Deleted

## 2024-02-18 NOTE — Telephone Encounter (Signed)
 Copied from CRM [id number]. Topic: Clinical - Prescription Issue >> Feb 18, 2024 11:02 AM Eva FALCON wrote: Reason for CRM: Pt states she reached out to her pharmacy today and states they told her they have not received her Losartan . I did notice it was sent on 1/31? Unsure if someone could reach out to pharmacy to confirm. Call back number for patient [phone].  Refill was sent.   Disp Refills Start End   losartan  (COZAAR ) 100 MG tablet 90 tablet 1 02/18/2024 --   Sig - Route: TAKE 1 TABLET BY MOUTH EVERY DAY - Oral   Sent to pharmacy as: losartan  (COZAAR ) 100 MG tablet   E-Prescribing Status: Receipt confirmed by pharmacy (02/18/2024 10:14 AM EST)    Associated Diagnoses  Primary hypertension     Pharmacy  [id number] - [place], [place] - [address] AT [address] [facility] [address]

## 2024-02-20 ENCOUNTER — Telehealth: Payer: Self-pay

## 2024-02-20 DIAGNOSIS — I10 Essential (primary) hypertension: Secondary | ICD-10-CM

## 2024-02-20 MED ORDER — LOSARTAN POTASSIUM 100 MG PO TABS: 100.0000 mg | ORAL_TABLET | Freq: Every day | ORAL | 1 refills | Status: AC

## 2024-02-20 NOTE — Telephone Encounter (Signed)
 Spoke with the pharmacist at [facility] and she stated the Rx was not received on 2/2.  Rx was re-sent via escribe.

## 2024-02-20 NOTE — Telephone Encounter (Signed)
 Copied from CRM [id number]. Topic: Clinical - Prescription Issue >> Feb 18, 2024 11:02 AM Eva FALCON wrote: Reason for CRM: Pt states she reached out to her pharmacy today and states they told her they have not received her Losartan . I did notice it was sent on 1/31? Unsure if someone could reach out to pharmacy to confirm. Call back number for patient [phone]. >> Feb 20, 2024 10:45 AM Macario HERO wrote: Patient called regarding medication refill: Losartan  patient stated she is out of her medication and [facility] said they have not received the prescription. Advised it was sent 02/18/24 but patient said she spoke with them and they do not have it.

## 2024-02-20 NOTE — Addendum Note (Signed)
 Addended by: CHRISTYNE IDELL DRAGON A on: 02/20/2024 12:11 PM   Modules accepted: Orders

## 2024-06-02 ENCOUNTER — Ambulatory Visit: Admitting: Allergy
